# Patient Record
Sex: Female | Born: 1955
Health system: Southern US, Community
[De-identification: ages and names within clinical notes are randomized; demographics above are authoritative.]

## PROBLEM LIST (undated history)

## (undated) DIAGNOSIS — I10 Essential (primary) hypertension: Secondary | ICD-10-CM

## (undated) DIAGNOSIS — R112 Nausea with vomiting, unspecified: Secondary | ICD-10-CM

## (undated) DIAGNOSIS — T4145XA Adverse effect of unspecified anesthetic, initial encounter: Secondary | ICD-10-CM

## (undated) DIAGNOSIS — K602 Anal fissure, unspecified: Secondary | ICD-10-CM

## (undated) DIAGNOSIS — F32A Depression, unspecified: Secondary | ICD-10-CM

## (undated) DIAGNOSIS — F419 Anxiety disorder, unspecified: Secondary | ICD-10-CM

## (undated) DIAGNOSIS — F329 Major depressive disorder, single episode, unspecified: Secondary | ICD-10-CM

## (undated) DIAGNOSIS — I639 Cerebral infarction, unspecified: Secondary | ICD-10-CM

## (undated) DIAGNOSIS — Q251 Coarctation of aorta: Secondary | ICD-10-CM

## (undated) DIAGNOSIS — J449 Chronic obstructive pulmonary disease, unspecified: Secondary | ICD-10-CM

## (undated) DIAGNOSIS — G459 Transient cerebral ischemic attack, unspecified: Secondary | ICD-10-CM

## (undated) DIAGNOSIS — K5792 Diverticulitis of intestine, part unspecified, without perforation or abscess without bleeding: Secondary | ICD-10-CM

## (undated) DIAGNOSIS — M069 Rheumatoid arthritis, unspecified: Secondary | ICD-10-CM

## (undated) DIAGNOSIS — H409 Unspecified glaucoma: Secondary | ICD-10-CM

## (undated) DIAGNOSIS — K219 Gastro-esophageal reflux disease without esophagitis: Secondary | ICD-10-CM

## (undated) DIAGNOSIS — T8859XA Other complications of anesthesia, initial encounter: Secondary | ICD-10-CM

## (undated) DIAGNOSIS — K589 Irritable bowel syndrome without diarrhea: Secondary | ICD-10-CM

## (undated) DIAGNOSIS — G4730 Sleep apnea, unspecified: Secondary | ICD-10-CM

## (undated) DIAGNOSIS — Z9889 Other specified postprocedural states: Secondary | ICD-10-CM

## (undated) HISTORY — DX: Coarctation of aorta: Q25.1

## (undated) HISTORY — PX: BREAST LUMPECTOMY: SHX2

## (undated) HISTORY — PX: WRIST SURGERY: SHX841

## (undated) HISTORY — PX: ELBOW SURGERY: SHX618

## (undated) HISTORY — DX: Depression, unspecified: F32.A

## (undated) HISTORY — DX: Sleep apnea, unspecified: G47.30

## (undated) HISTORY — DX: Rheumatoid arthritis, unspecified: M06.9

## (undated) HISTORY — DX: Anal fissure, unspecified: K60.2

## (undated) HISTORY — PX: FISSURECTOMY: SHX5244

## (undated) HISTORY — DX: Major depressive disorder, single episode, unspecified: F32.9

## (undated) HISTORY — PX: FINGER SURGERY: SHX640

---

## 1980-03-05 HISTORY — PX: ABDOMINAL HYSTERECTOMY: SHX81

## 1998-10-14 ENCOUNTER — Other Ambulatory Visit: Admission: RE | Admit: 1998-10-14 | Discharge: 1998-10-14 | Payer: Self-pay | Admitting: Orthopedic Surgery

## 1999-04-11 ENCOUNTER — Ambulatory Visit (HOSPITAL_COMMUNITY): Admission: RE | Admit: 1999-04-11 | Discharge: 1999-04-11 | Payer: Self-pay | Admitting: Orthopedic Surgery

## 1999-04-11 ENCOUNTER — Encounter: Payer: Self-pay | Admitting: Orthopedic Surgery

## 1999-07-04 ENCOUNTER — Other Ambulatory Visit: Admission: RE | Admit: 1999-07-04 | Discharge: 1999-07-04 | Payer: Self-pay | Admitting: Orthopedic Surgery

## 2001-03-05 HISTORY — PX: NISSEN FUNDOPLICATION: SHX2091

## 2001-03-05 HISTORY — PX: CHOLECYSTECTOMY: SHX55

## 2001-04-23 ENCOUNTER — Ambulatory Visit (HOSPITAL_COMMUNITY): Admission: RE | Admit: 2001-04-23 | Discharge: 2001-04-23 | Payer: Self-pay | Admitting: General Surgery

## 2003-04-16 ENCOUNTER — Ambulatory Visit (HOSPITAL_COMMUNITY): Admission: RE | Admit: 2003-04-16 | Discharge: 2003-04-16 | Payer: Self-pay | Admitting: Internal Medicine

## 2008-07-31 ENCOUNTER — Inpatient Hospital Stay (HOSPITAL_COMMUNITY): Admission: EM | Admit: 2008-07-31 | Discharge: 2008-08-06 | Payer: Self-pay | Admitting: Internal Medicine

## 2008-07-31 ENCOUNTER — Encounter: Payer: Self-pay | Admitting: Emergency Medicine

## 2008-08-01 ENCOUNTER — Encounter (INDEPENDENT_AMBULATORY_CARE_PROVIDER_SITE_OTHER): Payer: Self-pay | Admitting: *Deleted

## 2009-01-24 ENCOUNTER — Ambulatory Visit (HOSPITAL_COMMUNITY): Admission: RE | Admit: 2009-01-24 | Discharge: 2009-01-24 | Payer: Self-pay | Admitting: Cardiovascular Disease

## 2009-01-24 ENCOUNTER — Ambulatory Visit: Payer: Self-pay | Admitting: Cardiovascular Disease

## 2009-04-05 ENCOUNTER — Emergency Department (HOSPITAL_COMMUNITY): Admission: EM | Admit: 2009-04-05 | Discharge: 2009-04-06 | Payer: Self-pay | Admitting: Emergency Medicine

## 2009-06-21 ENCOUNTER — Ambulatory Visit (HOSPITAL_COMMUNITY): Admission: RE | Admit: 2009-06-21 | Discharge: 2009-06-21 | Payer: Self-pay | Admitting: Internal Medicine

## 2009-08-23 ENCOUNTER — Encounter: Admission: RE | Admit: 2009-08-23 | Discharge: 2009-08-23 | Payer: Self-pay | Admitting: Internal Medicine

## 2009-09-28 ENCOUNTER — Ambulatory Visit (HOSPITAL_COMMUNITY): Admission: RE | Admit: 2009-09-28 | Discharge: 2009-09-28 | Payer: Self-pay | Admitting: General Surgery

## 2009-10-02 ENCOUNTER — Emergency Department (HOSPITAL_COMMUNITY): Admission: EM | Admit: 2009-10-02 | Discharge: 2009-10-03 | Payer: Self-pay | Admitting: Emergency Medicine

## 2009-11-15 ENCOUNTER — Ambulatory Visit: Payer: Self-pay | Admitting: Internal Medicine

## 2009-11-15 DIAGNOSIS — K625 Hemorrhage of anus and rectum: Secondary | ICD-10-CM | POA: Insufficient documentation

## 2009-11-15 DIAGNOSIS — K219 Gastro-esophageal reflux disease without esophagitis: Secondary | ICD-10-CM | POA: Insufficient documentation

## 2009-11-15 DIAGNOSIS — R1310 Dysphagia, unspecified: Secondary | ICD-10-CM | POA: Insufficient documentation

## 2009-11-15 DIAGNOSIS — R1013 Epigastric pain: Secondary | ICD-10-CM | POA: Insufficient documentation

## 2009-11-15 DIAGNOSIS — K5900 Constipation, unspecified: Secondary | ICD-10-CM | POA: Insufficient documentation

## 2009-11-17 ENCOUNTER — Encounter (INDEPENDENT_AMBULATORY_CARE_PROVIDER_SITE_OTHER): Payer: Self-pay | Admitting: *Deleted

## 2009-11-17 ENCOUNTER — Encounter: Payer: Self-pay | Admitting: Gastroenterology

## 2009-12-08 ENCOUNTER — Encounter: Payer: Self-pay | Admitting: Internal Medicine

## 2009-12-14 ENCOUNTER — Ambulatory Visit (HOSPITAL_COMMUNITY): Admission: RE | Admit: 2009-12-14 | Discharge: 2009-12-14 | Payer: Self-pay | Admitting: Internal Medicine

## 2009-12-14 ENCOUNTER — Ambulatory Visit: Payer: Self-pay | Admitting: Internal Medicine

## 2009-12-14 HISTORY — PX: ESOPHAGOGASTRODUODENOSCOPY: SHX1529

## 2009-12-14 HISTORY — PX: COLONOSCOPY: SHX174

## 2010-01-01 ENCOUNTER — Encounter: Payer: Self-pay | Admitting: Internal Medicine

## 2010-03-14 ENCOUNTER — Encounter (INDEPENDENT_AMBULATORY_CARE_PROVIDER_SITE_OTHER): Payer: Self-pay | Admitting: *Deleted

## 2010-04-06 NOTE — Miscellaneous (Signed)
Summary: sleep apnea   Past Medical History:    TCS, 2004, Dr. Gabriel Cirri, hyperplastic polyp.     HTN    Seasonal allergies    Insomnia    Depression    H/O rectal fissures    IBS    GERD    Diabetes    Gout    RA    TIA 2 years ago.    sleep apnea/cpap Clinical Lists Changes  Observations: Added new observation of PAST MED HX: TCS, 2004, Dr. Gabriel Cirri, hyperplastic polyp.  HTN Seasonal allergies Insomnia Depression H/O rectal fissures IBS GERD Diabetes Gout RA TIA 2 years ago. sleep apnea/cpap  (11/17/2009 16:12)
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
## 2010-04-06 NOTE — Letter (Signed)
Summary: PROCEDURE APPROVAL CONFIRMATION  PROCEDURE APPROVAL CONFIRMATION   Imported By: Ave Filter 12/08/2009 08:44:33  _____________________________________________________________________  External Attachment:    Type:   Image     Comment:   External Document

## 2010-04-06 NOTE — Letter (Signed)
Summary: Recall Office Visit  [facility]  [address]   [place], [place] [address]   Phone: [phone]  Fax: [phone]      March 14, 2010   [name] [address] [place], [place]  [address] [birth year]   Dear Ms. Rafuse,   According to our records, it is time for you to schedule a follow-up office visit with [place].   At your convenience, please call [phone] to schedule an office visit. If you have any questions, concerns, or feel that this letter is in error, we would appreciate your call.   Sincerely,    Diana Eves  [facility] Ph: [phone]   Fax: [phone]

## 2010-04-06 NOTE — Letter (Signed)
Summary: Patient Notice, Colon Biopsy Results  [facility]  [address]   [place], [place] [address]   Phone: [phone]  Fax: [phone]       January 01, 2010   CHARNEE TURNIPSEED [address] [place], [place]  [address] 1955/06/04    Dear Ms. Petrosino,  I am pleased to inform you that the biopsies taken during your recent colonoscopy did not show any evidence of cancer upon pathologic examination.  Additional information/recommendations:  You should have a repeat colonoscopy examination  in 5 years.  Please call us if you are having persistent problems or have questions about your condition that have not been fully answered at this time.  Sincerely,    R. Roetta Sessions MD, FACP [facility] Ph: [phone]    Fax: [phone]   Appended Document: Patient Notice, Colon Biopsy Results Letter mailed to pt.  Appended Document: Patient Notice, Colon Biopsy Results reminder in computer

## 2010-04-06 NOTE — Letter (Signed)
Summary: Recall, Screening Colonoscopy Only  [facility]  [address]   [place], [place] [address]   Phone: [phone]  Fax: [phone]    November 17, 2009  Brittney Martinez [address] [place], [place]  [address] [birth year]   Dear [name],   Our records indicate it is time to schedule your colonoscopy.  However, our office has been unable to contact you by phone.   Please call our office at [phone] and ask for the [facility].   Thank you,    Ave Filter  [facility] Ph: [phone]   Fax: [phone]

## 2010-04-06 NOTE — Letter (Signed)
Summary: EGD/TCS ORDER  EGD/TCS ORDER   Imported By: Ave Filter 11/15/2009 12:07:01  _____________________________________________________________________  External Attachment:    Type:   Image     Comment:   External Document

## 2010-05-18 LAB — GLUCOSE, CAPILLARY: Glucose-Capillary: 120 mg/dL — ABNORMAL HIGH (ref 70–99)

## 2010-05-20 LAB — BASIC METABOLIC PANEL
BUN: 10 mg/dL (ref 6–23)
BUN: 4 mg/dL — ABNORMAL LOW (ref 6–23)
CO2: 24 mEq/L (ref 19–32)
CO2: 25 mEq/L (ref 19–32)
Calcium: 9.1 mg/dL (ref 8.4–10.5)
Calcium: 9.2 mg/dL (ref 8.4–10.5)
Chloride: 104 mEq/L (ref 96–112)
Chloride: 107 mEq/L (ref 96–112)
Creatinine, Ser: 0.65 mg/dL (ref 0.4–1.2)
Creatinine, Ser: 0.66 mg/dL (ref 0.4–1.2)
GFR calc Af Amer: >60 mL/min (ref >60)
GFR calc Af Amer: >60 mL/min (ref >60)
GFR calc non Af Amer: >60 mL/min (ref >60)
GFR calc non Af Amer: >60 mL/min (ref >60)
Glucose, Bld: 127 mg/dL — ABNORMAL HIGH (ref 70–99)
Glucose, Bld: 167 mg/dL — ABNORMAL HIGH (ref 70–99)
Potassium: 3.5 mEq/L (ref 3.5–5.1)
Potassium: 4.1 mEq/L (ref 3.5–5.1)
Sodium: 136 mEq/L (ref 135–145)
Sodium: 139 mEq/L (ref 135–145)

## 2010-05-20 LAB — CBC
HCT: 33.2 % — ABNORMAL LOW (ref 36.0–46.0)
HCT: 37.1 % (ref 36.0–46.0)
Hemoglobin: 11.4 g/dL — ABNORMAL LOW (ref 12.0–15.0)
Hemoglobin: 12.6 g/dL (ref 12.0–15.0)
MCH: 30.9 pg (ref 26.0–34.0)
MCH: 31.1 pg (ref 26.0–34.0)
MCHC: 33.9 g/dL (ref 30.0–36.0)
MCHC: 34.4 g/dL (ref 30.0–36.0)
MCV: 90.5 fL (ref 78.0–100.0)
MCV: 91.3 fL (ref 78.0–100.0)
Platelets: 232 10*3/uL (ref 150–400)
Platelets: 244 10*3/uL (ref 150–400)
RBC: 3.67 MIL/uL — ABNORMAL LOW (ref 3.87–5.11)
RBC: 4.07 MIL/uL (ref 3.87–5.11)
RDW: 14.8 % (ref 11.5–15.5)
RDW: 15.3 % (ref 11.5–15.5)
WBC: 5.3 10*3/uL (ref 4.0–10.5)
WBC: 8.5 10*3/uL (ref 4.0–10.5)

## 2010-05-20 LAB — DIFFERENTIAL
Basophils Absolute: 0 10*3/uL (ref 0.0–0.1)
Basophils Relative: 1 % (ref 0–1)
Eosinophils Absolute: 0.1 10*3/uL (ref 0.0–0.7)
Eosinophils Relative: 1 % (ref 0–5)
Lymphocytes Relative: 22 % (ref 12–46)
Lymphs Abs: 1.9 10*3/uL (ref 0.7–4.0)
Monocytes Absolute: 0.7 10*3/uL (ref 0.1–1.0)
Monocytes Relative: 8 % (ref 3–12)
Neutro Abs: 5.8 10*3/uL (ref 1.7–7.7)
Neutrophils Relative %: 68 % (ref 43–77)

## 2010-05-20 LAB — SURGICAL PCR SCREEN
MRSA, PCR: NEGATIVE
Staphylococcus aureus: POSITIVE — AB

## 2010-05-20 LAB — POCT CARDIAC MARKERS
CKMB, poc: <1 ng/mL — ABNORMAL LOW (ref 1.0–8.0)
Myoglobin, poc: 84.1 ng/mL (ref 12–200)
Troponin i, poc: <0.05 ng/mL (ref 0.00–0.09)

## 2010-05-20 LAB — GLUCOSE, CAPILLARY
Glucose-Capillary: 123 mg/dL — ABNORMAL HIGH (ref 70–99)
Glucose-Capillary: 140 mg/dL — ABNORMAL HIGH (ref 70–99)

## 2010-06-07 LAB — GLUCOSE, CAPILLARY: Glucose-Capillary: 80 mg/dL (ref 70–99)

## 2010-06-12 LAB — GLUCOSE, CAPILLARY
Glucose-Capillary: 102 mg/dL — ABNORMAL HIGH (ref 70–99)
Glucose-Capillary: 104 mg/dL — ABNORMAL HIGH (ref 70–99)
Glucose-Capillary: 107 mg/dL — ABNORMAL HIGH (ref 70–99)
Glucose-Capillary: 110 mg/dL — ABNORMAL HIGH (ref 70–99)
Glucose-Capillary: 121 mg/dL — ABNORMAL HIGH (ref 70–99)
Glucose-Capillary: 125 mg/dL — ABNORMAL HIGH (ref 70–99)
Glucose-Capillary: 127 mg/dL — ABNORMAL HIGH (ref 70–99)
Glucose-Capillary: 129 mg/dL — ABNORMAL HIGH (ref 70–99)
Glucose-Capillary: 134 mg/dL — ABNORMAL HIGH (ref 70–99)
Glucose-Capillary: 135 mg/dL — ABNORMAL HIGH (ref 70–99)
Glucose-Capillary: 151 mg/dL — ABNORMAL HIGH (ref 70–99)
Glucose-Capillary: 169 mg/dL — ABNORMAL HIGH (ref 70–99)
Glucose-Capillary: 170 mg/dL — ABNORMAL HIGH (ref 70–99)
Glucose-Capillary: 183 mg/dL — ABNORMAL HIGH (ref 70–99)

## 2010-06-12 LAB — BASIC METABOLIC PANEL
BUN: 8 mg/dL (ref 6–23)
CO2: 27 mEq/L (ref 19–32)
Calcium: 8.9 mg/dL (ref 8.4–10.5)
Chloride: 109 mEq/L (ref 96–112)
Creatinine, Ser: 0.84 mg/dL (ref 0.4–1.2)
GFR calc Af Amer: >60 mL/min (ref >60)
GFR calc non Af Amer: >60 mL/min (ref >60)
Glucose, Bld: 202 mg/dL — ABNORMAL HIGH (ref 70–99)
Potassium: 4 mEq/L (ref 3.5–5.1)
Sodium: 142 mEq/L (ref 135–145)

## 2010-06-12 LAB — LUPUS ANTICOAGULANT PANEL
DRVVT: 41.4 secs (ref 36.1–47.0)
Lupus Anticoagulant: NOT DETECTED
PTT Lupus Anticoagulant: 30.5 secs — ABNORMAL LOW (ref 36.3–48.8)

## 2010-06-12 LAB — PROTEIN C, TOTAL: Protein C, Total: 123 % (ref 70–140)

## 2010-06-12 LAB — RPR: RPR Ser Ql: NONREACTIVE

## 2010-06-12 LAB — PROTEIN S ACTIVITY: Protein S Activity: 119 % (ref 69–129)

## 2010-06-12 LAB — CBC
HCT: 34.4 % — ABNORMAL LOW (ref 36.0–46.0)
Hemoglobin: 11.7 g/dL — ABNORMAL LOW (ref 12.0–15.0)
MCHC: 34.1 g/dL (ref 30.0–36.0)
MCV: 92.4 fL (ref 78.0–100.0)
Platelets: 232 10*3/uL (ref 150–400)
RBC: 3.72 MIL/uL — ABNORMAL LOW (ref 3.87–5.11)
RDW: 16.4 % — ABNORMAL HIGH (ref 11.5–15.5)
WBC: 6.5 10*3/uL (ref 4.0–10.5)

## 2010-06-12 LAB — BETA-2-GLYCOPROTEIN I ABS, IGG/M/A
Beta-2 Glyco I IgG: <4 U/mL (ref <20)
Beta-2-Glycoprotein I IgA: 6 U/mL (ref <10)
Beta-2-Glycoprotein I IgM: <4 U/mL (ref <10)

## 2010-06-12 LAB — CARDIOLIPIN ANTIBODIES, IGG, IGM, IGA
Anticardiolipin IgA: 13 [APL'U] — ABNORMAL LOW (ref <13)
Anticardiolipin IgG: <7 [GPL'U] — ABNORMAL LOW (ref <11)
Anticardiolipin IgM: <7 [MPL'U] — ABNORMAL LOW (ref <10)

## 2010-06-12 LAB — ANTITHROMBIN III: AntiThromb III Func: 134 % — ABNORMAL HIGH (ref 76–126)

## 2010-06-12 LAB — SEDIMENTATION RATE: Sed Rate: 60 mm/hr — ABNORMAL HIGH (ref 0–22)

## 2010-06-12 LAB — C4 COMPLEMENT: Complement C4, Body Fluid: 37 mg/dL (ref 16–47)

## 2010-06-12 LAB — C3 COMPLEMENT: C3 Complement: 198 mg/dL (ref 88–201)

## 2010-06-12 LAB — PROTHROMBIN GENE MUTATION

## 2010-06-12 LAB — ANA: Anti Nuclear Antibody(ANA): NEGATIVE

## 2010-06-12 LAB — HOMOCYSTEINE: Homocysteine: 13 umol/L (ref 4.0–15.4)

## 2010-06-12 LAB — COMPLEMENT, TOTAL: Compl, Total (CH50): >60 U/mL (ref 31–66)

## 2010-06-12 LAB — PROTEIN S, TOTAL: Protein S Ag, Total: 141 % — ABNORMAL HIGH (ref 70–140)

## 2010-06-12 LAB — FACTOR 5 LEIDEN

## 2010-06-12 LAB — PROTEIN C ACTIVITY: Protein C Activity: 154 % — ABNORMAL HIGH (ref 75–133)

## 2010-06-13 LAB — LIPID PANEL
Cholesterol: 210 mg/dL — ABNORMAL HIGH (ref 0–200)
HDL: 43 mg/dL (ref >39)
LDL Cholesterol: 148 mg/dL — ABNORMAL HIGH (ref 0–99)
Total CHOL/HDL Ratio: 4.9 RATIO
Triglycerides: 93 mg/dL (ref <150)
VLDL: 19 mg/dL (ref 0–40)

## 2010-06-13 LAB — DIFFERENTIAL
Basophils Absolute: 0.1 10*3/uL (ref 0.0–0.1)
Basophils Absolute: 0.1 10*3/uL (ref 0.0–0.1)
Basophils Relative: 1 % (ref 0–1)
Basophils Relative: 1 % (ref 0–1)
Eosinophils Absolute: 0.1 10*3/uL (ref 0.0–0.7)
Eosinophils Absolute: 0.1 10*3/uL (ref 0.0–0.7)
Eosinophils Relative: 1 % (ref 0–5)
Eosinophils Relative: 1 % (ref 0–5)
Lymphocytes Relative: 40 % (ref 12–46)
Lymphocytes Relative: 44 % (ref 12–46)
Lymphs Abs: 2.7 10*3/uL (ref 0.7–4.0)
Lymphs Abs: 3 10*3/uL (ref 0.7–4.0)
Monocytes Absolute: 0.4 10*3/uL (ref 0.1–1.0)
Monocytes Absolute: 0.4 10*3/uL (ref 0.1–1.0)
Monocytes Relative: 5 % (ref 3–12)
Monocytes Relative: 6 % (ref 3–12)
Neutro Abs: 3.2 10*3/uL (ref 1.7–7.7)
Neutro Abs: 3.6 10*3/uL (ref 1.7–7.7)
Neutrophils Relative %: 48 % (ref 43–77)
Neutrophils Relative %: 53 % (ref 43–77)

## 2010-06-13 LAB — GLUCOSE, CAPILLARY
Glucose-Capillary: 101 mg/dL — ABNORMAL HIGH (ref 70–99)
Glucose-Capillary: 102 mg/dL — ABNORMAL HIGH (ref 70–99)
Glucose-Capillary: 119 mg/dL — ABNORMAL HIGH (ref 70–99)
Glucose-Capillary: 124 mg/dL — ABNORMAL HIGH (ref 70–99)
Glucose-Capillary: 132 mg/dL — ABNORMAL HIGH (ref 70–99)
Glucose-Capillary: 134 mg/dL — ABNORMAL HIGH (ref 70–99)
Glucose-Capillary: 137 mg/dL — ABNORMAL HIGH (ref 70–99)
Glucose-Capillary: 161 mg/dL — ABNORMAL HIGH (ref 70–99)
Glucose-Capillary: 179 mg/dL — ABNORMAL HIGH (ref 70–99)
Glucose-Capillary: 79 mg/dL (ref 70–99)

## 2010-06-13 LAB — COMPREHENSIVE METABOLIC PANEL
ALT: 35 U/L (ref 0–35)
ALT: 43 U/L — ABNORMAL HIGH (ref 0–35)
AST: 27 U/L (ref 0–37)
AST: 39 U/L — ABNORMAL HIGH (ref 0–37)
Albumin: 3.2 g/dL — ABNORMAL LOW (ref 3.5–5.2)
Albumin: 3.4 g/dL — ABNORMAL LOW (ref 3.5–5.2)
Alkaline Phosphatase: 59 U/L (ref 39–117)
Alkaline Phosphatase: 68 U/L (ref 39–117)
BUN: 10 mg/dL (ref 6–23)
BUN: 8 mg/dL (ref 6–23)
CO2: 27 mEq/L (ref 19–32)
CO2: 30 mEq/L (ref 19–32)
Calcium: 8.9 mg/dL (ref 8.4–10.5)
Calcium: 8.9 mg/dL (ref 8.4–10.5)
Chloride: 104 mEq/L (ref 96–112)
Chloride: 110 mEq/L (ref 96–112)
Creatinine, Ser: 0.87 mg/dL (ref 0.4–1.2)
Creatinine, Ser: 0.92 mg/dL (ref 0.4–1.2)
GFR calc Af Amer: >60 mL/min (ref >60)
GFR calc Af Amer: >60 mL/min (ref >60)
GFR calc non Af Amer: >60 mL/min (ref >60)
GFR calc non Af Amer: >60 mL/min (ref >60)
Glucose, Bld: 200 mg/dL — ABNORMAL HIGH (ref 70–99)
Glucose, Bld: 85 mg/dL (ref 70–99)
Potassium: 3.4 mEq/L — ABNORMAL LOW (ref 3.5–5.1)
Potassium: 3.9 mEq/L (ref 3.5–5.1)
Sodium: 140 mEq/L (ref 135–145)
Sodium: 143 mEq/L (ref 135–145)
Total Bilirubin: 0.4 mg/dL (ref 0.3–1.2)
Total Bilirubin: 0.5 mg/dL (ref 0.3–1.2)
Total Protein: 6.3 g/dL (ref 6.0–8.3)
Total Protein: 6.6 g/dL (ref 6.0–8.3)

## 2010-06-13 LAB — URINE CULTURE
Colony Count: >=100000
Colony Count: >=100000
Special Requests: NEGATIVE

## 2010-06-13 LAB — CBC
HCT: 35.2 % — ABNORMAL LOW (ref 36.0–46.0)
HCT: 36.4 % (ref 36.0–46.0)
HCT: 37.2 % (ref 36.0–46.0)
Hemoglobin: 11.9 g/dL — ABNORMAL LOW (ref 12.0–15.0)
Hemoglobin: 12.4 g/dL (ref 12.0–15.0)
Hemoglobin: 13 g/dL (ref 12.0–15.0)
MCHC: 33.9 g/dL (ref 30.0–36.0)
MCHC: 34 g/dL (ref 30.0–36.0)
MCHC: 34.9 g/dL (ref 30.0–36.0)
MCV: 91.5 fL (ref 78.0–100.0)
MCV: 91.7 fL (ref 78.0–100.0)
MCV: 92.7 fL (ref 78.0–100.0)
Platelets: 226 10*3/uL (ref 150–400)
Platelets: 226 10*3/uL (ref 150–400)
Platelets: 231 10*3/uL (ref 150–400)
RBC: 3.79 MIL/uL — ABNORMAL LOW (ref 3.87–5.11)
RBC: 3.98 MIL/uL (ref 3.87–5.11)
RBC: 4.06 MIL/uL (ref 3.87–5.11)
RDW: 16.3 % — ABNORMAL HIGH (ref 11.5–15.5)
RDW: 16.5 % — ABNORMAL HIGH (ref 11.5–15.5)
RDW: 17.5 % — ABNORMAL HIGH (ref 11.5–15.5)
WBC: 6 10*3/uL (ref 4.0–10.5)
WBC: 6.7 10*3/uL (ref 4.0–10.5)
WBC: 6.9 10*3/uL (ref 4.0–10.5)

## 2010-06-13 LAB — BASIC METABOLIC PANEL
BUN: 9 mg/dL (ref 6–23)
CO2: 22 mEq/L (ref 19–32)
Calcium: 8.6 mg/dL (ref 8.4–10.5)
Chloride: 108 mEq/L (ref 96–112)
Creatinine, Ser: 0.79 mg/dL (ref 0.4–1.2)
GFR calc Af Amer: >60 mL/min (ref >60)
GFR calc non Af Amer: >60 mL/min (ref >60)
Glucose, Bld: 118 mg/dL — ABNORMAL HIGH (ref 70–99)
Potassium: 3.9 mEq/L (ref 3.5–5.1)
Sodium: 139 mEq/L (ref 135–145)

## 2010-06-13 LAB — CULTURE, BLOOD (ROUTINE X 2)
Culture: NO GROWTH
Culture: NO GROWTH

## 2010-06-13 LAB — PROTIME-INR
INR: 0.9 (ref 0.00–1.49)
INR: 1 (ref 0.00–1.49)
Prothrombin Time: 12.5 seconds (ref 11.6–15.2)
Prothrombin Time: 12.9 seconds (ref 11.6–15.2)

## 2010-06-13 LAB — URINALYSIS, ROUTINE W REFLEX MICROSCOPIC
Bilirubin Urine: NEGATIVE
Glucose, UA: NEGATIVE mg/dL
Hgb urine dipstick: NEGATIVE
Ketones, ur: NEGATIVE mg/dL
Leukocytes, UA: NEGATIVE
Nitrite: POSITIVE — AB
Protein, ur: NEGATIVE mg/dL
Specific Gravity, Urine: >1.03 — ABNORMAL HIGH (ref 1.005–1.030)
Urobilinogen, UA: 0.2 mg/dL (ref 0.0–1.0)
pH: 6 (ref 5.0–8.0)

## 2010-06-13 LAB — URINE MICROSCOPIC-ADD ON

## 2010-06-13 LAB — HEMOGLOBIN A1C
Hgb A1c MFr Bld: 6.8 % — ABNORMAL HIGH (ref 4.6–6.1)
Mean Plasma Glucose: 148 mg/dL

## 2010-06-13 LAB — PHOSPHORUS: Phosphorus: 4.6 mg/dL (ref 2.3–4.6)

## 2010-06-13 LAB — TSH: TSH: 1.496 u[IU]/mL (ref 0.350–4.500)

## 2010-06-13 LAB — RAPID URINE DRUG SCREEN, HOSP PERFORMED
Amphetamines: NOT DETECTED
Barbiturates: NOT DETECTED
Benzodiazepines: NOT DETECTED
Cocaine: NOT DETECTED
Opiates: NOT DETECTED
Tetrahydrocannabinol: NOT DETECTED

## 2010-06-13 LAB — APTT
aPTT: 20 seconds — ABNORMAL LOW (ref 24–37)
aPTT: 23 seconds — ABNORMAL LOW (ref 24–37)

## 2010-06-13 LAB — MAGNESIUM: Magnesium: 1.9 mg/dL (ref 1.5–2.5)

## 2010-07-18 NOTE — H&P (Signed)
Brittney Martinez, Brittney Martinez                 ACCOUNT NO.:  [id number]   MEDICAL RECORD NO.:  [id number]          PATIENT TYPE:  INP   LOCATION:  4729                         FACILITY:  [facility]   PHYSICIAN:  Manus Gunning, MD      DATE OF BIRTH:  1956-03-02   DATE OF ADMISSION:  07/31/2008  DATE OF DISCHARGE:                              HISTORY & PHYSICAL   CHIEF COMPLAINT:  Right-sided upper extremity weakness with associated  slurring of speech.   HISTORY OF PRESENT ILLNESS:  Brittney Martinez is a pleasant 55 year old African  American female who claims that she woke up this morning at 7 a.m. with  no complaints,  fell back to sleep and then woke up at 12 noon.  At 12  noon, she developed slurring of her speech and claimed that she knew  what words she wanted to articulate and could understand the people  surrounding her but was unable to pronounce the words secondary to the  slurring.  She also complained of right upper extremity weakness and  claimed that whenever she held in her right hand promptly fell out if  it.  This prompted her present to the emergency department where  symptoms had completely resolved by the time of presentation.  She  claims total duration of symptoms was approximately 1 hour.  She  complains of associated headache and lightheadedness.  No syncope, no  presyncope.  No trauma.  No blurring of vision.  No tinnitus, no  odynophagia, no dysphagia, no hoarseness of voice, no neck fullness.  No  chest pain, palpitations, PND, orthopnea.  No shortness of breath.  Positive cough.  No expectoration.  No dyspnea on exertion.  No sick  contacts.  No recent travel history.  No abdominal pain.  No nausea,  vomiting, diarrhea, dysuria, polyuria, hematuria or constipation.  No  bright red blood per rectum or melenic stools.  She has a history of  chronic pain and rheumatoid arthritis.   PAST MEDICAL/SURGICAL HISTORY:  1. Diabetes mellitus type 2.  2. GERD.  3. Hypertension.  4.  Irritable bowel syndrome.  5. Rheumatoid arthritis.  6. Carpal tunnels release.  7. Hysterectomy.  8. Cholecystectomy.  9. Dental extraction.  10.Biopsy of breast   ALLERGIES:  1. ASPIRIN.  2. DARVOCET.  3. ERYTHROMYCIN.   FAMILY HISTORY:  Mother died at giving birth to the patient.  Father  died, had a history of colon cancer.   SOCIAL HISTORY:  Smokes one pack every 2 days for approximately 20  years.  Denies alcohol.  Denies illicits.   NECK SECTION:   HOME MEDICATIONS:  Are as follows and this is per the patient's self  recollection:  1. Toprol 50 mg p.o. daily.  2. Doxepin 25 mg 3 at bedtime.  3. Prednisone 5 mg takes 2.5 mg daily.  4. Flexeril 25 mg daily at night.  5. Methotrexate 5 mg on Thursday and 5 mg on Friday.  6. Prilosec 40 mg daily.  7. Allegra 50 mg daily.  8. Albuterol inhaler 1 inhalation as needed.   REVIEW OF SYSTEMS:  Essentially a 14-point review of systems performed.  Pertinent positives and negatives as described above.   PHYSICAL EXAMINATION:  VITAL SIGNS:  At time of presentation are as  follows:  Temperature 98, heart rate 104, respiratory rate 20, blood  pressure 116/79, O2 saturation 100%.  GENERAL:  Well-nourished, well-developed [place] American female resting  comfortably in bed no apparent distress.  HEENT: Normocephalic,  atraumatic.  Moist oral mucosa.  No thrush, erythema or postnasal drip.  Eyes anicteric.  Extraocular muscles are intact.  Pupils are equal,  react to light and accommodation.  NECK:  Supple.  Good range of motion.  No thyromegaly.  No carotid  bruits.  Neck veins appear within normal limits.  CARDIOVASCULAR:  S1-S2 normal regular rate and rhythm.  No murmurs, rubs  or gallops.  RESPIRATIONS:  Air entry bilaterally equal.  No rales, rhonchi or  wheezes appreciated.  ABDOMEN:  Soft, nontender, nondistended.  Positive bowel sounds.  No  organomegaly.  EXTREMITIES: No cyanosis, clubbing or edema.  Positive  bilateral  dorsalis pedis.  CNS: Alert, oriented x3.  Cranial nerves II-XII grossly intact.  Power,  sensation and reflexes bilaterally symmetrical.  SKIN:  No breakdown swelling, ulcerations or masses.  HEM/ONCOLOGY: No palpable lymphadenopathy, ecchymosis, bruising or  petechiae.   LABORATORY TESTS:  CT scan of the head without contrast demonstrating  negative head CT. Urinalysis demonstrates many bacteria with 7-10 WBC  with negative leukocytes, positive nitrites, negative blood, protein,  ketones, bilirubin and glucose.  PTT 20, PT 12.5, INR 0.9.  Sodium 140,  potassium 3.4, chloride 104, CO2 30, glucose 200, BUN 10, creatinine  0.92, T bili 0.4, alk phos 68, AST 39, ALT 43, total protein 6.6,  albumin 3.4, calcium 8.99.  WBC 6900, hemoglobin 13, hematocrit 37.2,  platelet count 226, 53% polymorphs.   ASSESSMENT/PLAN:  1. Slurred speech with right-sided weakness.  Rule out cerebrovascular      accident.  Obtain MRI, obtain ultrasound of bilateral carotids.      Obtain 2-D echocardiogram.  Start the patient on Plavix 75 mg p.o.      daily.  Allow permissive hypertension for systolic blood pressures      around 140-160 mmHg.  Dr. Roseanne Reno of neurology is to see the      patient in the morning.  Obtain PT, OT consult as well as a speech      therapy evaluation.  A bedside swallow study was negative for      aspiration.  2. Urinary tract infection, appears to be mild.  Start Rocephin 1 gram      IV q.24 hours.  3. History of diabetes mellitus type 2.  Continue home medications.      Check hemoglobin A1c and fasting lipid profile.  4. Hypertension.  At this time allow permissive hypertension.  Hold      metoprolol for this time.  If systolic blood pressure greater than      180, hydralazine 10 mg IV q.6 hours p.r.n..  5. Rheumatoid arthritis.  Continue DMARDs and prednisone.  6. Gastrointestinal and deep vein thrombosis prophylaxis.  Sequential      compression devices and continue  Prilosec 40 mg p.o. daily.      Manus Gunning, MD  Electronically Signed     SP/MEDQ  D:  07/31/2008  T:  08/01/2008  Job:  [id number]

## 2010-07-18 NOTE — Discharge Summary (Signed)
Brittney Martinez, Brittney Martinez                 ACCOUNT NO.:  [id number]   MEDICAL RECORD NO.:  [id number]          PATIENT TYPE:  INP   LOCATION:  4712                         FACILITY:  [facility]   PHYSICIAN:  Ruthy Dick, MD    DATE OF BIRTH:  [birth year]   DATE OF ADMISSION:  07/31/2008  DATE OF DISCHARGE:                               DISCHARGE SUMMARY   REASON FOR ADMISSION:  Right-sided upper extremity weakness with slurred  speech.   FINAL DISCHARGE DIAGNOSES:  1. Acute cerebrovascular accident.  2. Urinary tract infection with resistant Escherichia coli.  3. Hypertension.  4. Diabetes mellitus.  5. Gastroesophageal reflux disease.  6. Irritable bowel syndrome.  7. Rheumatoid arthritis.  8. Carpel tunnel release.  9. Nonsustained ventricular tachycardia.   CONSULT DURING THIS ADMISSION:  Neurology consults.   PROCEDURES DONE DURING THIS ADMISSION:  1. CT scan of the head on presentation, which was negative.  2. MRI of the head, which showed [place] scattered prompted areas of      restricted effusions compatible with infarct in the posterior left      frontal lobe, no hemorrhage was seen.  3. CT angiogram of the neck, which showed mild aortic and right      carotid artery atherosclerosis, no hemodynamic stenosis in the      neck.  4. CT angiogram of the head, which showed bilateral internal carotid      artery calcified atherosclerosis without hemodynamically      significant stenosis.  It was thought to be otherwise negative      intracranial CTA.  5. A 2-D echocardiogram was read as having no cardiac source of emboli      identified.  There was no recurrent symptoms and hence      transesophageal echo was not necessary at this point.  Ejection      fraction was thought to be 65-70% with grade 2 diastolic      dysfunction.  6. Carotid Doppler was done and was noted to have no significant      plaque noted and no significant ICA stenosis noted bilaterally.   BRIEF HISTORY OF  PRESENT ILLNESS AND HOSPITAL COURSE:  This is a 55-year-  old African American lady with a past medical history as noted to  include rheumatoid arthritis, hypertension, diabetes mellitus who came  to the hospital after waking up around 7:00 a.m. with no complaints, but  at 12 noon started developing slurred speech and right-sided weakness  especially in the upper arm.  Admitted to the hospital to evaluate for  TIA versus CVA and MRI eventually indicated possibility of CVA.  The  patient started initially on aspirin, but the patient apparently is  either allergic to this medication or cannot tolerate it and has not  been placed on Plavix.  She was also noted during this admission to have  urinary tract infection with extended beta-lactamase E-coli and has been  started on Invanz IV.  She will be sent home on IV Invanz for 6 more  days.  Home health would be needed for IV antibiotics.  She has done  well today and says that she is doing very well except for occasional  headache, which is chronic for her and she seems to correlate this with  poor sleep often at night.  She is doing well today, no chest pain, no  shortness of breath, and no weakness noted.  No slurred speech, no  nausea and vomiting, no diarrhea, or no constipation.  The patient was  seen by the neurologist while he was in the hospital and the dose of  Plavix was decided by neurologist.   FOLLOWUP:  The patient is to follow with Dr. Jearld Fenton in [place] who is the  patient's primary care physician.  Dr. Jearld Fenton is also to decide if the  patient would need further workup as far as her nonsustained V-tach is  concerned.  The nonsustained V-tach in the hospital was easily  controlled with metoprolol.  A 2-D echo was fine and we believe the  patient requires further workup.  She could have this done in the  outpatient setting.  Again, Dr. Jearld Fenton is to consider looking to this.  The patient is to follow with Dr. Jearld Fenton in 1 week. F/U Dr  Marlis Edelson office  for outpt bubble study.   TIME USED FOR DISCHARGE PLAN:  Greater than 30 minutes.   DISCHARGE MEDICATIONS:  This patient is to go home on the following  medications:  1. Toprol-XL 100 mg p.o. daily.  2. Doxepin 75 mg daily.  3. Prednisone 2.5 mg p.o. daily.  4. Flexeril 2.5 mg p.o. at bedtime.  5. Prilosec 40 mg p.o. daily.  6. Allegra 60 mg p.o. daily.  7. Albuterol inhaler as needed.  8. Invanz 1 g IV daily for 6 days.  9. Plavix 75 mg p.o. daily.  10.Zocor 20 mg p.o. at bedtime.  11.Glucotrol 5 mg p.o. daily.   The patient is to have home health for IV antibiotics, and PICC line to  be discontinued once antibiotics completed.      Ruthy Dick, MD  Electronically Signed     GU/MEDQ  D:  08/04/2008  T:  08/04/2008  Job:  [id number]   cc:   Suzanna Obey, M.D.

## 2010-07-18 NOTE — Consult Note (Signed)
Brittney Martinez, Brittney Martinez                 ACCOUNT NO.:  [id number]   MEDICAL RECORD NO.:  [id number]          PATIENT TYPE:  INP   LOCATION:  4729                         FACILITY:  [facility]   PHYSICIAN:  Noel Christmas, MD    DATE OF BIRTH:  1955-05-11   DATE OF CONSULTATION:  08/01/2008  DATE OF DISCHARGE:                                 CONSULTATION   REASON FOR CONSULTATION:  Transient ischemic attack.   HISTORY OF PRESENT ILLNESS:  This is a 55 year old lady with a history  of experiencing transient speech difficulty and right upper extremity  weakness for about 1 hour yesterday.  She had trouble getting words out.  Speech reportedly was slightly slurred as well.  She had no right lower  extremity weakness but had weakness on the right upper extremity.  There  was no headache.  There was no previous history of stroke or TIA.  She  was seen in [facility] Room.  A CT scan of her hand  was negative.  The patient has not been on antiplatelet therapy.   PAST MEDICAL HISTORY:  Remarkable for hypertension, diabetes mellitus,  rheumatoid arthritis, irritable bowel syndrome, and GERD.   CURRENT MEDICATIONS:  1. Ultracet one 3 times a day p.r.n.  2. Prilosec 20 mg twice a day.  3. Allegra 180 mg per day.  4. Metoprolol 50 mg per day.  5. Dicyclomine 10 mg twice a day.  6. Doxepin 25 mg t.i.d.  7. Hydroxychloroquine 200 mg per day.  8. Prednisone 10 mg per day.  9. Premarin 0.45 mg per day.  10.Glipizide XL 5 mg per day.  11.Flexeril 10 mg twice a day.  12.Methotrexate 2.5 mg on Thursday and Friday of each week.  13.Calcium with vitamin D supplement daily.   FAMILY HISTORY:  Positive for hypertension.  There is no family history  of diabetes mellitus nor stroke.   PHYSICAL EXAMINATION:  GENERAL:  Appearance was that of an obese middle-  aged lady who was alert and cooperative, and in no acute distress.  There was no receptive or expressive aphasia.  Mental status was  normal.  HEENT:  Pupils were equal and reactive normally to light.  Extraocular  movements were full and conjugate.  Visual fields were intact and  normal.  There was no facial numbness.  No facial weakness.  Hearing was  normal.  Speech and palatal movement were normal.  NEUROLOGIC:  She had no pronator drift of upper extremity on either  side.  Strength and manual testing was normal throughout.  Deep tendon  reflexes were normal and symmetrical except for trace reflexes of the  ankles.  Plantar responses were flexor.  Sensory examination was normal.  Coordination was normal.  Carotid auscultation revealed no bruits.   CLINICAL IMPRESSION:  Transient ischemic attack involving left middle  cerebral artery territory, resolved.   RECOMMENDATIONS:  1. MRI and MRA of the brain without contrast as planned.  2. MRA of the neck with contrast.  3. Echocardiogram.  4. Aspirin 81 mg daily.   Thank you for asking me  to evaluate [name].      Noel Christmas, MD  Electronically Signed     CS/MEDQ  D:  08/01/2008  T:  08/02/2008  Job:  [id number]

## 2010-07-18 NOTE — Discharge Summary (Signed)
NAMEMALANEY, MCBEAN                 ACCOUNT NO.:  [id number]   MEDICAL RECORD NO.:  [id number]          PATIENT TYPE:  INP   LOCATION:  4712                         FACILITY:  [facility]   PHYSICIAN:  Ruthy Dick, MD    DATE OF BIRTH:  March 23, 1955   DATE OF ADMISSION:  07/31/2008  DATE OF DISCHARGE:  08/06/2008                               DISCHARGE SUMMARY   ADDENDUM   Please refer to the aforementioned document for final discharge  diagnoses, discharge medications, and follow up plan except as will be  noted in this new document.  In the interim, the patient's discharge was  withheld because the patient continued to have nonsustained V tach on  the telemetry strip.  Because of this, Cardiology evaluation was  requested.  The patient was seen by the [facility] Cardiology, and they  decided to do a stress Cardiolite study on the patient prior to  discharge.  This a.m., the patient has had a stress Cardiolite study and  the result is still pending.  If these results turn to be negative, then  the patient should be able to go home today on her medications.  As an  addendum to her discharge medications, the Cardiology must increase her  Toprol-XL to 100 mg daily.  Also as an addendum to the follow up plans,  she is to follow up with Dr. Marlis Edelson office to arrange outpatient bubble  study and emboli follow up within 1 month.  Phone number has been  provided in the instructions paper.  She is also to follow with Dr.  Eldridge Dace on August 24, 2008 and this is [facility].  Phone number  would also be given in the instructions paper.  Also, her IV antibiotics  of Invanz should now be for an additional 4 days starting from tomorrow.  Today, her vitals show a temperature of 97.0, pulse 77, respirations 18,  blood pressure 102/50, and saturating 98% on room air.  The patient has  no complaints whatsoever today, no chest pain, no shortness of breath,  no abdominal pain, no nausea, no vomiting, no  diarrhea, no constipation.  Time used for discharge planning is greater than 30 minutes.      Ruthy Dick, MD  Electronically Signed     GU/MEDQ  D:  08/06/2008  T:  08/07/2008  Job:  [id number]

## 2010-07-21 NOTE — H&P (Signed)
[facility]  Patient:    Brittney Martinez, Brittney Martinez Visit Number: [id number] MRN: [id number]          Service Type: Attending:  Elpidio Anis, M.D. Dictated by:   Elpidio Anis, M.D.                           History and Physical  HISTORY OF PRESENT ILLNESS:  The patient is a 55 year old female with a history of gastroesophageal reflux disease, recurrent dysphagia much worse with meals, no weight loss.  She also has left lower quadrant abdominal pain, and bowel movements are variable.  The patient is scheduled for upper endoscopy.  PAST MEDICAL HISTORY:  She has chronic pain, irritable bowel syndrome, seasonal allergies, asthma.  MEDICATIONS:  1. Baclofen 25 mg t.i.d.  2. Nexium 40 mg t.i.d.  3. Meclizine 12.5 mg t.i.d.  4. Allegra 180 mg q.d.  5. Diazepam 5 mg t.i.d.  6. Levsin 0.375 mg t.i.d.  7. Zantac 150 mg b.i.d.  8. Trazodone 5 mg q.h.s.  9. Paxil 40 mg b.i.d. 10. Flonase 50 mcg 1 puff b.i.d. 11. Premarin 1.25 mg q.d. 12. Albuterol 2 puffs q.d.  REVIEW OF SYSTEMS:  Positive for queasiness, shortness of breath, chronic hoarseness, chronic pain, difficulty with constipation and diarrhea.  She has severe personal and emotional distress.  She is chronically depressed.  PHYSICAL EXAMINATION:  VITAL SIGNS:  Blood pressure 120/68, pulse 84, respirations 24, weight 194 pounds.  HEENT:  No abnormality noted.  NECK:  Supple without JVD or bruit.  CHEST:  Clear to auscultation.  HEART:  Regular rate and rhythm without murmur, gallop, or rub.  ABDOMEN:  Diffuse tenderness, worse in the left upper quadrant and left lower quadrant.  No hepatosplenomegaly.  Normoactive bowel sounds.  EXTREMITIES:  No cyanosis, clubbing, or edema.  No joint deformity.  No detectable soft tissue tenderness.  NEUROLOGIC:  No focal motor, sensory, or cerebellar deficit.  IMPRESSION: 1. Gastroesophageal reflux disease with recurrent dysphagia. 2. Bronchial asthma. 3.  Irritable bowel syndrome.  PLAN:  Upper endoscopy. Dictated by:   Elpidio Anis, M.D. Attending:  Elpidio Anis, M.D. DD:  04/22/01 TD:  04/22/01 Job: [id number]

## 2010-10-01 ENCOUNTER — Emergency Department (HOSPITAL_COMMUNITY)
Admission: EM | Admit: 2010-10-01 | Discharge: 2010-10-01 | Disposition: A | Discharge status: Home / Self Care | Payer: Medicare Other | Attending: Emergency Medicine | Admitting: Emergency Medicine

## 2010-10-01 ENCOUNTER — Emergency Department (HOSPITAL_COMMUNITY): Payer: Medicare Other

## 2010-10-01 ENCOUNTER — Encounter: Payer: Self-pay | Admitting: Emergency Medicine

## 2010-10-01 DIAGNOSIS — F17200 Nicotine dependence, unspecified, uncomplicated: Secondary | ICD-10-CM | POA: Insufficient documentation

## 2010-10-01 DIAGNOSIS — R197 Diarrhea, unspecified: Secondary | ICD-10-CM | POA: Insufficient documentation

## 2010-10-01 DIAGNOSIS — J45909 Unspecified asthma, uncomplicated: Secondary | ICD-10-CM | POA: Insufficient documentation

## 2010-10-01 DIAGNOSIS — K219 Gastro-esophageal reflux disease without esophagitis: Secondary | ICD-10-CM | POA: Insufficient documentation

## 2010-10-01 DIAGNOSIS — Z79899 Other long term (current) drug therapy: Secondary | ICD-10-CM | POA: Insufficient documentation

## 2010-10-01 DIAGNOSIS — R1031 Right lower quadrant pain: Secondary | ICD-10-CM

## 2010-10-01 DIAGNOSIS — I10 Essential (primary) hypertension: Secondary | ICD-10-CM | POA: Insufficient documentation

## 2010-10-01 DIAGNOSIS — R110 Nausea: Secondary | ICD-10-CM | POA: Insufficient documentation

## 2010-10-01 DIAGNOSIS — E119 Type 2 diabetes mellitus without complications: Secondary | ICD-10-CM | POA: Insufficient documentation

## 2010-10-01 HISTORY — DX: Transient cerebral ischemic attack, unspecified: G45.9

## 2010-10-01 HISTORY — DX: Essential (primary) hypertension: I10

## 2010-10-01 HISTORY — DX: Diverticulitis of intestine, part unspecified, without perforation or abscess without bleeding: K57.92

## 2010-10-01 HISTORY — DX: Gastro-esophageal reflux disease without esophagitis: K21.9

## 2010-10-01 HISTORY — DX: Anxiety disorder, unspecified: F41.9

## 2010-10-01 HISTORY — DX: Irritable bowel syndrome, unspecified: K58.9

## 2010-10-01 LAB — DIFFERENTIAL
Basophils Absolute: 0 10*3/uL (ref 0.0–0.1)
Basophils Relative: 1 % (ref 0–1)
Eosinophils Absolute: 0.2 10*3/uL (ref 0.0–0.7)
Eosinophils Relative: 3 % (ref 0–5)
Lymphocytes Relative: 51 % — ABNORMAL HIGH (ref 12–46)
Lymphs Abs: 2.7 10*3/uL (ref 0.7–4.0)
Monocytes Absolute: 0.6 10*3/uL (ref 0.1–1.0)
Monocytes Relative: 10 % (ref 3–12)
Neutro Abs: 1.9 10*3/uL (ref 1.7–7.7)
Neutrophils Relative %: 35 % — ABNORMAL LOW (ref 43–77)

## 2010-10-01 LAB — URINALYSIS, ROUTINE W REFLEX MICROSCOPIC
Bilirubin Urine: NEGATIVE
Glucose, UA: NEGATIVE mg/dL
Hgb urine dipstick: NEGATIVE
Ketones, ur: NEGATIVE mg/dL
Leukocytes, UA: NEGATIVE
Nitrite: NEGATIVE
Protein, ur: NEGATIVE mg/dL
Specific Gravity, Urine: 1.01 (ref 1.005–1.030)
Urobilinogen, UA: 0.2 mg/dL (ref 0.0–1.0)
pH: 6.5 (ref 5.0–8.0)

## 2010-10-01 LAB — BASIC METABOLIC PANEL
BUN: 9 mg/dL (ref 6–23)
CO2: 21 mEq/L (ref 19–32)
Calcium: 9.2 mg/dL (ref 8.4–10.5)
Chloride: 107 mEq/L (ref 96–112)
Creatinine, Ser: 0.58 mg/dL (ref 0.50–1.10)
GFR calc Af Amer: >60 mL/min (ref >60)
GFR calc non Af Amer: >60 mL/min (ref >60)
Glucose, Bld: 151 mg/dL — ABNORMAL HIGH (ref 70–99)
Potassium: 3.6 mEq/L (ref 3.5–5.1)
Sodium: 141 mEq/L (ref 135–145)

## 2010-10-01 LAB — CBC
HCT: 36.2 % (ref 36.0–46.0)
Hemoglobin: 12.3 g/dL (ref 12.0–15.0)
MCH: 29.9 pg (ref 26.0–34.0)
MCHC: 34 g/dL (ref 30.0–36.0)
MCV: 87.9 fL (ref 78.0–100.0)
Platelets: 266 10*3/uL (ref 150–400)
RBC: 4.12 MIL/uL (ref 3.87–5.11)
RDW: 14.5 % (ref 11.5–15.5)
WBC: 5.3 10*3/uL (ref 4.0–10.5)

## 2010-10-01 MED ORDER — ONDANSETRON HCL 4 MG PO TABS: 4.0000 mg | ORAL_TABLET | Freq: Four times a day (QID) | ORAL | Status: AC

## 2010-10-01 MED ORDER — SODIUM CHLORIDE 0.9 % IV BOLUS (SEPSIS)
1000.0000 mL | Freq: Once | INTRAVENOUS | Status: AC
  Administered 2010-10-01: 1000 mL via INTRAVENOUS

## 2010-10-01 MED ORDER — HYDROMORPHONE HCL 1 MG/ML IJ SOLN
1.0000 mg | Freq: Once | INTRAMUSCULAR | Status: AC
  Administered 2010-10-01: 1 mg via INTRAVENOUS
  Filled 2010-10-01: qty 1

## 2010-10-01 MED ORDER — HYDROCODONE-ACETAMINOPHEN 5-500 MG PO TABS: 1.0000 | ORAL_TABLET | Freq: Four times a day (QID) | ORAL | Status: AC | PRN

## 2010-10-01 MED ORDER — CIPROFLOXACIN HCL 500 MG PO TABS: 500.0000 mg | ORAL_TABLET | Freq: Two times a day (BID) | ORAL | Status: AC

## 2010-10-01 MED ORDER — IOHEXOL 300 MG/ML  SOLN
100.0000 mL | Freq: Once | INTRAMUSCULAR | Status: AC | PRN
  Administered 2010-10-01: 100 mL via INTRAVENOUS

## 2010-10-01 MED ORDER — ONDANSETRON HCL 4 MG/2ML IJ SOLN
4.0000 mg | Freq: Once | INTRAMUSCULAR | Status: AC
  Administered 2010-10-01: 4 mg via INTRAVENOUS
  Filled 2010-10-01: qty 2

## 2010-10-01 MED ORDER — METRONIDAZOLE 500 MG PO TABS: 500.0000 mg | ORAL_TABLET | Freq: Two times a day (BID) | ORAL | Status: AC

## 2010-10-01 NOTE — Discharge Instructions (Signed)
Abdominal Pain Abdominal pain can be caused by many things. Your caregiver decides the seriousness of your pain by an examination and possibly blood tests and X-rays. Many cases can be observed and treated at home. Most abdominal pain is not caused by a disease and will probably improve without treatment. However, in many cases, more time must pass before a clear cause of the pain can be found. Before that point, it may not be known if you need more testing, or if hospitalization or surgery is needed. HOME CARE INSTRUCTIONS  Do not take laxatives unless directed by your caregiver.   Take pain medicine only as directed by your caregiver.   Only take over-the-counter or prescription medicines for pain, discomfort, or fever as directed by your caregiver.   Try a clear liquid diet (broth, tea, or water) for vomiting or as ordered by your caregiver. Slowly move to a bland diet as tolerated.  SEEK IMMEDIATE MEDICAL CARE IF:  The pain does not go away.   You or your child has an oral temperature above 101, not controlled by medicine.   You keep throwing up (vomiting).   The pain is felt only in portions of the abdomen. Pain in the right side could possibly be appendicitis. In an adult, pain in the left lower portion of the abdomen could be colitis or diverticulitis.   You pass bloody or black tarry stools.  MAKE SURE YOU:  Understand these instructions.   Will watch your condition.   Will get help right away if you are not doing well or get worse.  Document Released: 11/29/2004 Document Re-Released: 05/16/2009 [facility] Patient Information 2011 [place], [place].

## 2010-10-01 NOTE — ED Notes (Signed)
Pt awaiting CT scan. Assisted to bathroom and back to bed at 1342.

## 2010-10-01 NOTE — ED Notes (Signed)
Pt given meds per MD order. Lights turned out and warm blanket given to pt. NS infusing without difficulty.

## 2010-10-01 NOTE — ED Provider Notes (Signed)
Scribed for Dr. Brooke Dare, the patient was seen in room 6. This chart was scribed by Jannette Fogo. This patient's care was started at 12:16.    Chief Complaint  Patient presents with  . Abdominal Pain    HPI Brittney Martinez is a 55 y.o. female with a history of GERD, IBS, and diverticulitis who presents to the Emergency Department complaining of 5 days of RLQ abdominal pain. Patient states she developed right lower abdominal pain on Wednesday 09/27/10 with associated nausea and diarrhea, but denies any vomiting. Reports a history of similar symptoms with diverticulitis in the past. There are no other associated symptoms and no other alleviating or aggravating factors. No change in stool - no diarrhea, constipation, blood in stool.    Past Medical History  Diagnosis Date  . GERD (gastroesophageal reflux disease)   . TIA (transient ischemic attack)   . IBS (irritable bowel syndrome)   . Diverticulitis   . Asthma   . Anxiety   . Diabetes mellitus   . Hypertension     Past Surgical History  Procedure Date  . Cholecystectomy   . Breast lumpectomy     right  . Wrist surgery     left  . Elbow surgery     right  . Finger surgery     on right middle, pointer, and index fingers  . Abdominal hysterectomy     MEDICATIONS:  Previous Medications   BENZONATATE (TESSALON) 100 MG CAPSULE    Take 100 mg by mouth 3 (three) times daily as needed. cough    CHOLECALCIFEROL (VITAMIN D3) 400 UNITS CAPS    Take 1 tablet by mouth daily.     CLOPIDOGREL (PLAVIX) 75 MG TABLET    Take 75 mg by mouth daily.     CYCLOSPORINE (RESTASIS) 0.05 % OPHTHALMIC EMULSION    Place 1 drop into both eyes 2 (two) times daily.     DEXLANSOPRAZOLE (DEXILANT) 60 MG CAPSULE    Take 60 mg by mouth daily.     DOXEPIN (SINEQUAN) 75 MG CAPSULE    Take 75 mg by mouth at bedtime.     GLIPIZIDE (GLUCOTROL XL) 5 MG 24 HR TABLET    Take 10 mg by mouth 2 (two) times daily.     HYDROCHLOROTHIAZIDE 25 MG TABLET    Take 25 mg by mouth  daily as needed. For swelling    METHOTREXATE (RHEUMATREX) 2.5 MG TABLET    Take 5 mg by mouth 2 (two) times a week. Caution:Chemotherapy. Protect from light.patient takes on Thursday and Friday    METOCLOPRAMIDE (REGLAN) 5 MG TABLET    Take 5 mg by mouth 2 (two) times daily after a meal.     METOPROLOL (TOPROL-XL) 100 MG 24 HR TABLET    Take 50 mg by mouth 2 (two) times daily.     SIMVASTATIN (ZOCOR) 20 MG TABLET    Take 20 mg by mouth at bedtime.       ALLERGIES:  Allergies as of 10/01/2010 - Review Complete 10/01/2010  Allergen Reaction Noted  . Aspirin    . Propoxyphene n-acetaminophen    . Zithromax (azithromycin)  10/01/2010     Family History  Problem Relation Age of Onset  . Cancer Father   . Hypertension Sister   . Hypertension Brother     History  Substance Use Topics  . Smoking status: Current Everyday Smoker -- 0.2 packs/day for 19 years    Types: Cigarettes  . Smokeless tobacco: Never Used  .  Alcohol Use: No    OB History    Grav Para Term Preterm Abortions TAB SAB Ect Mult Living   2 2 2       2       Review of Systems  Constitutional: Negative.  Negative for fever.  HENT: Negative.   Respiratory: Negative.   Cardiovascular: Negative.   Gastrointestinal: Positive for nausea, abdominal pain and diarrhea. Negative for vomiting.  Genitourinary: Negative.   Musculoskeletal: Negative.   Skin: Negative.   Neurological: Negative.   Hematological: Negative.   All other systems reviewed and are negative.    Physical Exam   ED Triage Vitals  Enc Vitals Group     BP 10/01/10 1121 113/62 mmHg     Pulse Rate 10/01/10 1121 71      Resp 10/01/10 1121 20      Temp 10/01/10 1121 98.3 F (36.8 C)     Temp src 10/01/10 1121 Oral     SpO2 10/01/10 1121 98 %     Weight 10/01/10 1121 220 lb (99.791 kg)     Height 10/01/10 1121 5\' 1"  (1.549 m)     Pain Score 10/01/10 1121 Seven    Physical Exam  Constitutional: She is oriented to person, place, and time. She  appears well-developed and well-nourished.  HENT:  Head: Normocephalic and atraumatic.  Mouth/Throat: Oropharynx is clear and moist.  Eyes: Conjunctivae are normal. Pupils are equal, round, and reactive to light.  Neck: Normal range of motion. Neck supple.  Cardiovascular: Normal rate and regular rhythm.   No murmur heard. Pulmonary/Chest: Effort normal and breath sounds normal.  Abdominal: Soft. Bowel sounds are normal. She exhibits no distension. There is tenderness (RLQ ). There is guarding (voluntary ). There is no rebound.  Musculoskeletal: Normal range of motion. She exhibits no edema and no tenderness.  Neurological: She is alert and oriented to person, place, and time.  Skin: Skin is warm and dry.  Psychiatric: She has a normal mood and affect.    OTHER DATA REVIEWED: Nursing notes, vital signs, and past medical records reviewed.   DIAGNOSTIC STUDIES: Oxygen Saturation is 98% on room air, normal  by my interpretation.     LABS / RADIOLOGY:  Results for orders placed during the hospital encounter of 10/01/10  CBC      Component Value Range   WBC 5.3  4.0 - 10.5 (K/uL)   RBC 4.12  3.87 - 5.11 (MIL/uL)   Hemoglobin 12.3  12.0 - 15.0 (g/dL)   HCT 96.2  95.2 - 84.1 (%)   MCV 87.9  78.0 - 100.0 (fL)   MCH 29.9  26.0 - 34.0 (pg)   MCHC 34.0  30.0 - 36.0 (g/dL)   RDW 32.4  40.1 - 02.7 (%)   Platelets 266  150 - 400 (K/uL)  DIFFERENTIAL      Component Value Range   Neutrophils Relative 35 (*) 43 - 77 (%)   Neutro Abs 1.9  1.7 - 7.7 (K/uL)   Lymphocytes Relative 51 (*) 12 - 46 (%)   Lymphs Abs 2.7  0.7 - 4.0 (K/uL)   Monocytes Relative 10  3 - 12 (%)   Monocytes Absolute 0.6  0.1 - 1.0 (K/uL)   Eosinophils Relative 3  0 - 5 (%)   Eosinophils Absolute 0.2  0.0 - 0.7 (K/uL)   Basophils Relative 1  0 - 1 (%)   Basophils Absolute 0.0  0.0 - 0.1 (K/uL)  BASIC METABOLIC PANEL  Component Value Range   Sodium 141  135 - 145 (mEq/L)   Potassium 3.6  3.5 - 5.1 (mEq/L)    Chloride 107  96 - 112 (mEq/L)   CO2 21  19 - 32 (mEq/L)   Glucose, Bld 151 (*) 70 - 99 (mg/dL)   BUN 9  6 - 23 (mg/dL)   Creatinine, Ser 1.61  0.50 - 1.10 (mg/dL)   Calcium 9.2  8.4 - 09.6 (mg/dL)   GFR calc non Af Amer >60  >60 (mL/min)   GFR calc Af Amer >60  >60 (mL/min)  URINALYSIS, ROUTINE W REFLEX MICROSCOPIC      Component Value Range   Color, Urine YELLOW  YELLOW    Appearance CLEAR  CLEAR    Specific Gravity, Urine 1.010  1.005 - 1.030    pH 6.5  5.0 - 8.0    Glucose, UA NEGATIVE  NEGATIVE (mg/dL)   Hgb urine dipstick NEGATIVE  NEGATIVE    Bilirubin Urine NEGATIVE  NEGATIVE    Ketones, ur NEGATIVE  NEGATIVE (mg/dL)   Protein, ur NEGATIVE  NEGATIVE (mg/dL)   Urobilinogen, UA 0.2  0.0 - 1.0 (mg/dL)   Nitrite NEGATIVE  NEGATIVE    Leukocytes, UA NEGATIVE  NEGATIVE     CT Abdomen / Pelvis: Interpreted by Radiologist Dr. Simonne Martinet. STROUD 1. No acute findings within the abdomen or pelvis. 2. Fatty infiltration of the liver. 3. Status post cholecystectomy.   ED COURSE / COORDINATION OF CARE: 15:00 - Re-examined, ED physician discussed results with the patient and family. Patient stable for discharge.  Pain improved.    MDM: Differential Diagnosis: non-specific abdominal pain v diverticulitis. Based on examination and the patient's history of diverticulitis there was concern that there is repeat episode of diverticulitis. She has no fever she does have associated nausea, vomiting, diarrhea. There is no blood in her stool. States she had similar episodes with prior tightness of diverticulitis. There is no antibiotic exposure. Given the increased pain from prior episodes a CT scan was performed. CT scan was negative for acute pathology. The results with the patient she still wishes to be treated for diverticulitis. I will provide a seven-day course of Cipro and Flagyl as well as pain and nausea control. She is instructed to follow up with her primary care physician in one week.  She should return for worsening symptoms including persistent vomiting, fever, worsening abdominal pain.  I personally performed the services described in this documentation, which was scribed in my presence. The recorded information has been reviewed and considered.   IMPRESSION: Diagnoses that have been ruled out:  Diagnoses that are still under consideration:  Final diagnoses:  Abdominal pain, right lower quadrant     PLAN: Discharge  The patient is to return the emergency department if there is any worsening of symptoms. I have reviewed the discharge instructions with the patient.    CONDITION ON DISCHARGE: Stable    MEDICATIONS GIVEN IN THE E.D.  Medications  metoCLOPramide (REGLAN) 5 MG tablet (not administered)  methotrexate (RHEUMATREX) 2.5 MG tablet (not administered)  glipiZIDE (GLUCOTROL XL) 5 MG 24 hr tablet (not administered)  doxepin (SINEQUAN) 75 MG capsule (not administered)  simvastatin (ZOCOR) 20 MG tablet (not administered)  clopidogrel (PLAVIX) 75 MG tablet (not administered)  metoprolol (TOPROL-XL) 100 MG 24 hr tablet (not administered)  dexlansoprazole (DEXILANT) 60 MG capsule (not administered)  cycloSPORINE (RESTASIS) 0.05 % ophthalmic emulsion (not administered)  hydrochlorothiazide 25 MG tablet (not administered)  Cholecalciferol (VITAMIN D3)  400 UNITS CAPS (not administered)  benzonatate (TESSALON) 100 MG capsule (not administered)  ciprofloxacin (CIPRO) 500 MG tablet (not administered)  metroNIDAZOLE (FLAGYL) 500 MG tablet (not administered)  HYDROcodone-acetaminophen (VICODIN) 5-500 MG per tablet (not administered)  ondansetron (ZOFRAN) 4 MG tablet (not administered)  sodium chloride 0.9 % bolus 1,000 mL (1000 mL Intravenous Given 10/01/10 1248)  HYDROmorphone (DILAUDID) injection 1 mg (1 mg Intravenous Given 10/01/10 1248)  ondansetron (ZOFRAN) injection 4 mg (4 mg Intravenous Given 10/01/10 1248)  iohexol (OMNIPAQUE) 300 MG/ML injection 100 mL  (100 mL Intravenous Contrast Given 10/01/10 1415)     DISCHARGE MEDICATIONS: New Prescriptions   CIPROFLOXACIN (CIPRO) 500 MG TABLET    Take 1 tablet (500 mg total) by mouth 2 (two) times daily.   HYDROCODONE-ACETAMINOPHEN (VICODIN) 5-500 MG PER TABLET    Take 1-2 tablets by mouth every 6 (six) hours as needed for pain.   METRONIDAZOLE (FLAGYL) 500 MG TABLET    Take 1 tablet (500 mg total) by mouth 2 (two) times daily.   ONDANSETRON (ZOFRAN) 4 MG TABLET    Take 1 tablet (4 mg total) by mouth every 6 (six) hours.     Procedures       Dayton Bailiff, MD 10/01/10 1536

## 2010-10-01 NOTE — ED Notes (Signed)
Patient c/o rt  Lower quad abd pain that started on Wednesday after eating lunch. Patient reports diarrhea and nausea but denies any vomiting or fevers. Patient states "It hurts after I eat." Patient has hx of diverticulitis. No active vomiting noted.

## 2010-11-22 ENCOUNTER — Other Ambulatory Visit: Payer: Self-pay | Admitting: Internal Medicine

## 2011-01-03 ENCOUNTER — Other Ambulatory Visit: Payer: Self-pay | Admitting: Internal Medicine

## 2011-01-03 NOTE — Telephone Encounter (Signed)
Needs F/U appt with [place]. Overdue for routine f/u.

## 2011-01-04 NOTE — Telephone Encounter (Signed)
Pt is aware of her OV on 11/30 @ 1030 with KJ

## 2011-01-08 ENCOUNTER — Encounter: Payer: Self-pay | Admitting: Internal Medicine

## 2011-01-09 ENCOUNTER — Ambulatory Visit (INDEPENDENT_AMBULATORY_CARE_PROVIDER_SITE_OTHER): Payer: Medicare Other | Admitting: Urgent Care

## 2011-01-09 ENCOUNTER — Encounter: Payer: Self-pay | Admitting: Urgent Care

## 2011-01-09 DIAGNOSIS — B379 Candidiasis, unspecified: Secondary | ICD-10-CM

## 2011-01-09 DIAGNOSIS — K6289 Other specified diseases of anus and rectum: Secondary | ICD-10-CM

## 2011-01-09 DIAGNOSIS — K5900 Constipation, unspecified: Secondary | ICD-10-CM

## 2011-01-09 DIAGNOSIS — K219 Gastro-esophageal reflux disease without esophagitis: Secondary | ICD-10-CM

## 2011-01-09 DIAGNOSIS — Z800 Family history of malignant neoplasm of digestive organs: Secondary | ICD-10-CM

## 2011-01-09 DIAGNOSIS — B372 Candidiasis of skin and nail: Secondary | ICD-10-CM

## 2011-01-09 DIAGNOSIS — E669 Obesity, unspecified: Secondary | ICD-10-CM | POA: Insufficient documentation

## 2011-01-09 MED ORDER — LIDOCAINE-HYDROCORTISONE ACE 3-2.5 % RE KIT: 1.0000 | PACK | Freq: Two times a day (BID) | RECTAL | Status: DC

## 2011-01-09 MED ORDER — DEXLANSOPRAZOLE 60 MG PO CPDR: 60.0000 mg | DELAYED_RELEASE_CAPSULE | Freq: Every day | ORAL | Status: DC

## 2011-01-09 MED ORDER — NYSTATIN-TRIAMCINOLONE 100000-0.1 UNIT/GM-% EX CREA: TOPICAL_CREAM | Freq: Three times a day (TID) | CUTANEOUS | Status: AC

## 2011-01-09 NOTE — Patient Instructions (Signed)
Low fat/low cholesterol diet Lose 1-2 pounds PER WEEK Continue dexilant 60mg  daily Apply mycolog for rash, if no better see Dr Sherril Croon Office visit in 2 yrs or sooner if needed Colonoscopy 2016  Serving Sizes What we call a serving size today is larger than it was in the past. A 1950s fast-food burger contained little more than 1 oz of meat, and a soft drink was 8 oz (1 cup). Today, a "quarter pounder" burger is at least 4 times that amount, and a 32 or 64 oz drink is not uncommon. A possible guide for eating when trying to lose weight is to eat about half as much as you normally do. Some estimates of serving sizes are:  1 Dairy serving:Individual container of yogurt (8 oz) or piece of cheese the size of your thumb (1 oz).   1 Grain serving: 1 slice of bread or  cup pasta.   1 Meat serving: The size of a deck of cards (3 oz).   1 Fruit serving: cup canned fruit or 1 medium fruit.   1 Vegetable serving:  cup of cooked or canned vegetables.   1 Fat serving:The size of 4 stacked dimes.  Experts suggest spending 1 or 2 days measuring food portions you commonly eat. This will give you better practice at estimating serving sizes, and will also show whether you are eating an appropriate amount of food to meet your weight goals. If you find that you are eating more than you thought, try measuring your food for a few days so you can "reprogram" yourself to learn what makes a healthy portion for you. SUGGESTIONS FOR CONTROL  In restaurants, share entrees, or ask the waiter to put half the entre in a box or bag before you even touch it.   Order lunch-sized portions. Many restaurants serve 4 to 6 oz of meat at lunch, compared with 8 to 10 oz at dinner.   Split dessert or skip it all together. Have a piece of fruit when you get home.   At home, use smaller plates and bowls. It will look as if you are eating more.   Plate your food in the kitchen rather than serving it "family style" at the  table.   Wait 20 to 30 minutes before taking seconds. This is how long it takes your brain to recognize that you are full.   Check food labels for serving sizes. Eat 1 serving only.   Use measuring cups and spoons to see proper serving sizes.   Buy smaller packages of candy, popcorn, and snacks.   Avoid eating directly out of the bag or carton.   While eating half as much, exercise twice as much. Park further away from the mall, take the stairs instead of the escalator, and walk around your block.  Losing weight is a slow, difficult process. It takes long-lasting lifestyle changes. You can make gradual changes over time so they become habits. Look to friends and family to support the healthy changes you are making. Avoid fad diets since they are often only temporary weight loss solutions. Document Released: 11/18/2002 Document Revised: 11/01/2010 Document Reviewed: 12/28/2008 [facility] Patient Information 2012 [place], [place].   Cholesterol Control Diet Cholesterol levels in your body are determined significantly by your diet. Cholesterol levels may also be related to heart disease. The following material helps to explain this relationship and discusses what you can do to help keep your heart healthy. Not all cholesterol is bad. Low-density lipoprotein (LDL) cholesterol is  the "bad" cholesterol. It may cause fatty deposits to build up inside your arteries. High-density lipoprotein (HDL) cholesterol is "good." It helps to remove the "bad" LDL cholesterol from your blood. Cholesterol is a very important risk factor for heart disease. Other risk factors are high blood pressure, smoking, stress, heredity, and weight. The heart muscle gets its supply of blood through the coronary arteries. If your LDL cholesterol is high and your HDL cholesterol is low, you are at risk for having fatty deposits build up in your coronary arteries. This leaves less room through which blood can flow. Without sufficient  blood and oxygen, the heart muscle cannot function properly and you may feel chest pains (angina pectoris). When a coronary artery closes up entirely, a part of the heart muscle may die, causing a heart attack (myocardial infarction). CHECKING CHOLESTEROL When your caregiver sends your blood to a lab to be analyzed for cholesterol, a complete lipid (fat) profile may be done. With this test, the total amount of cholesterol and levels of LDL and HDL are determined. Triglycerides are a type of fat that circulates in the blood and can also be used to determine heart disease risk. The list below describes what the numbers should be: Test: Total Cholesterol.  Less than 200 mg/dl.  Test: LDL "bad cholesterol."  Less than 100 mg/dl.   Less than 70 mg/dl if you are at very high risk of a heart attack or sudden cardiac death.  Test: HDL "good cholesterol."  Greater than 50 mg/dl for women.   Greater than 40 mg/dl for men.  Test: Triglycerides.  Less than 150 mg/dl.  CONTROLLING CHOLESTEROL WITH DIET Although exercise and lifestyle factors are important, your diet is key. That is because certain foods are known to raise cholesterol and others to lower it. The goal is to balance foods for their effect on cholesterol and more importantly, to replace saturated and trans fat with other types of fat, such as monounsaturated fat, polyunsaturated fat, and omega-3 fatty acids. On average, a person should consume no more than 15 to 17 g of saturated fat daily. Saturated and trans fats are considered "bad" fats, and they will raise LDL cholesterol. Saturated fats are primarily found in animal products such as meats, butter, and cream. However, that does not mean you need to sacrifice all your favorite foods. Today, there are good tasting, low-fat, low-cholesterol substitutes for most of the things you like to eat. Choose low-fat or nonfat alternatives. Choose round or loin cuts of red meat, since these types of cuts  are lowest in fat and cholesterol. Chicken (without the skin), fish, veal, and ground [place] breast are excellent choices. Eliminate fatty meats, such as hot dogs and salami. Even shellfish have little or no saturated fat. Have a 3 oz (85 g) portion when you eat lean meat, poultry, or fish. Trans fats are also called "partially hydrogenated oils." They are oils that have been scientifically manipulated so that they are solid at room temperature resulting in a longer shelf life and improved taste and texture of foods in which they are added. Trans fats are found in stick margarine, some tub margarines, cookies, crackers, and baked goods.  When baking and cooking, oils are an excellent substitute for butter. The monounsaturated oils are especially beneficial since it is believed they lower LDL and raise HDL. The oils you should avoid entirely are saturated tropical oils, such as coconut and palm.  Remember to eat liberally from food groups that are naturally  free of saturated and trans fat, including fish, fruit, vegetables, beans, grains (barley, rice, couscous, bulgur wheat), and pasta (without cream sauces).  IDENTIFYING FOODS THAT LOWER CHOLESTEROL  Soluble fiber may lower your cholesterol. This type of fiber is found in fruits such as apples, vegetables such as broccoli, potatoes, and carrots, legumes such as beans, peas, and lentils, and grains such as barley. Foods fortified with plant sterols (phytosterol) may also lower cholesterol. You should eat at least 2 g per day of these foods for a cholesterol lowering effect.  Read package labels to identify low-saturated fats, trans fats free, and low-fat foods at the supermarket. Select cheeses that have only 2 to 3 g saturated fat per ounce. Use a heart-healthy tub margarine that is free of trans fats or partially hydrogenated oil. When buying baked goods (cookies, crackers), avoid partially hydrogenated oils. Breads and muffins should be made from whole  grains (whole-wheat or whole oat flour, instead of "flour" or "enriched flour"). Buy non-creamy canned soups with reduced salt and no added fats.  FOOD PREPARATION TECHNIQUES  Never deep-fry. If you must fry, either stir-fry, which uses very little fat, or use non-stick cooking sprays. When possible, broil, bake, or roast meats, and steam vegetables. Instead of dressing vegetables with butter or margarine, use lemon and herbs, applesauce and cinnamon (for squash and sweet potatoes), nonfat yogurt, salsa, and low-fat dressings for salads.  LOW-SATURATED FAT / LOW-FAT FOOD SUBSTITUTES Meats / Saturated Fat (g)  Avoid: Steak, marbled (3 oz/85 g) / 11 g   Choose: Steak, lean (3 oz/85 g) / 4 g   Avoid: Hamburger (3 oz/85 g) / 7 g   Choose: Hamburger, lean (3 oz/85 g) / 5 g   Avoid: Ham (3 oz/85 g) / 6 g   Choose: Ham, lean cut (3 oz/85 g) / 2.4 g   Avoid: Chicken, with skin, dark meat (3 oz/85 g) / 4 g   Choose: Chicken, skin removed, dark meat (3 oz/85 g) / 2 g   Avoid: Chicken, with skin, light meat (3 oz/85 g) / 2.5 g   Choose: Chicken, skin removed, light meat (3 oz/85 g) / 1 g  Dairy / Saturated Fat (g)  Avoid: Whole milk (1 cup) / 5 g   Choose: Low-fat milk, 2% (1 cup) / 3 g   Choose: Low-fat milk, 1% (1 cup) / 1.5 g   Choose: Skim milk (1 cup) / 0.3 g   Avoid: Hard cheese (1 oz/28 g) / 6 g   Choose: Skim milk cheese (1 oz/28 g) / 2 to 3 g   Avoid: Cottage cheese, 4% fat (1 cup) / 6.5 g   Choose: Low-fat cottage cheese, 1% fat (1 cup) / 1.5 g   Avoid: Ice cream (1 cup) / 9 g   Choose: Sherbet (1 cup) / 2.5 g   Choose: Nonfat frozen yogurt (1 cup) / 0.3 g   Choose: Frozen fruit bar / trace   Avoid: Whipped cream (1 tbs) / 3.5 g   Choose: Nondairy whipped topping (1 tbs) / 1 g  Condiments / Saturated Fat (g)  Avoid: Mayonnaise (1 tbs) / 2 g   Choose: Low-fat mayonnaise (1 tbs) / 1 g   Avoid: Butter (1 tbs) / 7 g   Choose: Extra light margarine (1 tbs) / 1  g   Avoid: Coconut oil (1 tbs) / 11.8 g   Choose: Olive oil (1 tbs) / 1.8 g   Choose: Corn oil (1 tbs) /  1.7 g   Choose: Safflower oil (1 tbs) / 1.2 g   Choose: Sunflower oil (1 tbs) / 1.4 g   Choose: Soybean oil (1 tbs) / 2.4 g   Choose: Canola oil (1 tbs) / 1 g  Document Released: 02/19/2005 Document Revised: 11/01/2010 Document Reviewed: 08/10/2010 [facility] Patient Information 2012 [place], [place].

## 2011-01-09 NOTE — Progress Notes (Signed)
Cc to PCP 

## 2011-01-09 NOTE — Assessment & Plan Note (Signed)
Mycolog II to the affected area 3 times a day Follow with VYAS,DHRUV B., MD, MD if no improvement

## 2011-01-09 NOTE — Assessment & Plan Note (Signed)
Chronic proctalgia. History of multiple anal fissures.  Anamantle HC forte twice a day

## 2011-01-09 NOTE — Assessment & Plan Note (Signed)
Doing well, occasional breakthrough at night after dinner. Change timing of Dexilant 60 mg to 30 minutes before dinner. If no better, call us.

## 2011-01-09 NOTE — Progress Notes (Signed)
Primary Care Physician:  Ignatius Specking., MD, MD Primary Gastroenterologist:  Dr. Jena Gauss  Chief Complaint  Patient presents with  . Medication Refill  . Rash    on left side   HPI:  Brittney Martinez is a 55 y.o. female here for follow up for IBS/GERD.  Doing well.  Occasional diarrhea intermittently.  Some weeks better than others.  Takes metamucil as needed.  Dexilant 60mg  9am daily.  After supper having some breakthrough.  Rash x 3 weeks itchy to lower abdomen & top of legs.  Past Medical History  Diagnosis Date  . GERD (gastroesophageal reflux disease)   . TIA (transient ischemic attack)   . IBS (irritable bowel syndrome)   . Diverticulitis   . Asthma   . Anxiety   . Diabetes mellitus   . Hypertension   . Depression     grief  . Anal fissure   . Gout   . RA (rheumatoid arthritis)   . Sleep apnea     cpap   Past Surgical History  Procedure Date  . Cholecystectomy 2003  . Breast lumpectomy     right-benign  . Wrist surgery     left  . Elbow surgery     right  . Finger surgery     on right middle, pointer, and index fingers  . Abdominal hysterectomy 1982    complete  . Esophagogastroduodenoscopy 12/14/09    schatzkis ring 77F, otherwise normal  . Colonoscopy 12/14/09    anal papilla and hemorrhoids,diminutive hperplastic rectal polyps/normal colon  . Nissen fundoplication 2003    [facility]  . Fissurectomy     several   Current Outpatient Prescriptions  Medication Sig Dispense Refill  . benzonatate (TESSALON) 100 MG capsule Take 100 mg by mouth 3 (three) times daily as needed. cough       . Cholecalciferol (VITAMIN D3) 400 UNITS CAPS Take 1 tablet by mouth daily.        . clopidogrel (PLAVIX) 75 MG tablet Take 75 mg by mouth daily.        . cycloSPORINE (RESTASIS) 0.05 % ophthalmic emulsion Place 1 drop into both eyes 2 (two) times daily.        Marland Kitchen dexlansoprazole (DEXILANT) 60 MG capsule Take 1 capsule (60 mg total) by mouth daily.  90 capsule  3  . doxepin  (SINEQUAN) 75 MG capsule Take 75 mg by mouth at bedtime.        Marland Kitchen glipiZIDE (GLUCOTROL XL) 5 MG 24 hr tablet Take 10 mg by mouth 2 (two) times daily.        . hydrochlorothiazide 25 MG tablet Take 25 mg by mouth daily as needed. For swelling       . methotrexate (RHEUMATREX) 2.5 MG tablet Take 5 mg by mouth 2 (two) times a week. Caution:Chemotherapy. Protect from light.patient takes on Thursday and Friday       . metoprolol (TOPROL-XL) 100 MG 24 hr tablet Take 50 mg by mouth 2 (two) times daily.        . simvastatin (ZOCOR) 20 MG tablet Take 20 mg by mouth at bedtime.        . Lidocaine-Hydrocortisone Ace 3-2.5 % KIT Place 1 kit rectally 2 (two) times daily.  20 each  1  . nystatin-triamcinolone (MYCOLOG II) cream Apply topically 3 (three) times daily. Apply to affected areas of abd/thighs for 2 weeks  30 g  0   Allergies as of 01/09/2011 - Review Complete 01/09/2011  Allergen  Reaction Noted  . Aspirin Nausea Only   . Propoxyphene n-acetaminophen Nausea And Vomiting   . Zithromax (azithromycin) Nausea Only 10/01/2010  Review of Systems: Gen: Denies any fever, chills, sweats, anorexia, fatigue, weakness, malaise, weight loss, and sleep disorder CV: Denies chest pain, angina, palpitations, syncope, orthopnea, PND, peripheral edema, and claudication. Resp: Denies dyspnea at rest, dyspnea with exercise, cough, sputum, wheezing, coughing up blood, and pleurisy. GI: Denies vomiting blood, jaundice, and fecal incontinence.   Denies dysphagia or odynophagia. Derm: Denies rash, itching, dry skin, hives, moles, warts, or unhealing ulcers.  Psych: Denies depression, anxiety, memory loss, suicidal ideation, hallucinations, paranoia, and confusion. Heme: Denies bruising, bleeding, and enlarged lymph nodes.  Physical Exam: BP 130/73  Pulse 73  Temp(Src) 97.1 F (36.2 C) (Temporal)  Ht 5\' 2"  (1.575 m)  Wt 213 lb 9.6 oz (96.888 kg)  BMI 39.07 kg/m2 General:   Alert,  Well-developed, obese, pleasant  and cooperative in NAD Head:  Normocephalic and atraumatic. Eyes:  Sclera clear, no icterus.   Conjunctiva pink. Mouth:  No deformity or lesions, OP pink/moist. Neck:  Supple; no masses or thyromegaly. Heart:  Regular rate and rhythm; no murmurs, clicks, rubs,  or gallops. Abdomen:  + slightly erythematous demarcated intertriginous areas.   Soft, obese, nontender and nondistended. No masses, hepatosplenomegaly. Small easily reducible umbilicus hernia. Nontender. Normal bowel sounds, without guarding, and without rebound.   Msk:  Symmetrical without gross deformities. Normal posture. Pulses:  Normal pulses noted. Extremities:  Without clubbing or edema. Neurologic:  Alert and  oriented x4;  grossly normal neurologically. Skin:  Intact without significant lesions or rashes. Cervical Nodes:  No significant cervical adenopathy. Psych:  Alert and cooperative. Normal mood and affect.

## 2011-01-09 NOTE — Assessment & Plan Note (Signed)
Low-fat, low-cholesterol diet. Weight loss one to 2 pounds per week.

## 2011-01-09 NOTE — Assessment & Plan Note (Signed)
IBS-mixed. Doing well with when necessary Metamucil.

## 2011-01-09 NOTE — Assessment & Plan Note (Addendum)
Colonoscopy 12/2014

## 2011-01-10 ENCOUNTER — Telehealth: Payer: Self-pay

## 2011-01-10 NOTE — Telephone Encounter (Signed)
PA paperwork started and on AS desk

## 2011-01-10 NOTE — Telephone Encounter (Signed)
Called pt- this is a PA for pts insurance

## 2011-01-10 NOTE — Telephone Encounter (Signed)
Phone number to call for her Lidocaine    [phone]. She said it will only cost her $40.00.

## 2011-01-11 ENCOUNTER — Telehealth: Payer: Self-pay | Admitting: Urgent Care

## 2011-01-11 NOTE — Telephone Encounter (Signed)
Spoke with pt- this just started this am.  boil is located between the "lips" of her vagina. Informed pt that she needed to call her pcp. Pt verbalized understanding and said she would call pcp.

## 2011-01-11 NOTE — Telephone Encounter (Signed)
Agree 

## 2011-01-11 NOTE — Telephone Encounter (Signed)
Is asking for an antibiotic for a boil that has come up between her legs please advise/uses [facility] in [place]

## 2011-02-02 ENCOUNTER — Ambulatory Visit: Payer: Medicare Other | Admitting: Urgent Care

## 2011-05-19 ENCOUNTER — Other Ambulatory Visit: Payer: Self-pay

## 2011-05-19 ENCOUNTER — Emergency Department (HOSPITAL_COMMUNITY)
Admission: EM | Admit: 2011-05-19 | Discharge: 2011-05-19 | Disposition: A | Discharge status: Home / Self Care | Payer: Medicare Other | Attending: Emergency Medicine | Admitting: Emergency Medicine

## 2011-05-19 ENCOUNTER — Encounter (HOSPITAL_COMMUNITY): Payer: Self-pay

## 2011-05-19 ENCOUNTER — Emergency Department (HOSPITAL_COMMUNITY): Payer: Medicare Other

## 2011-05-19 DIAGNOSIS — M79609 Pain in unspecified limb: Secondary | ICD-10-CM | POA: Insufficient documentation

## 2011-05-19 DIAGNOSIS — J45909 Unspecified asthma, uncomplicated: Secondary | ICD-10-CM | POA: Insufficient documentation

## 2011-05-19 DIAGNOSIS — E119 Type 2 diabetes mellitus without complications: Secondary | ICD-10-CM | POA: Insufficient documentation

## 2011-05-19 DIAGNOSIS — R071 Chest pain on breathing: Secondary | ICD-10-CM | POA: Insufficient documentation

## 2011-05-19 DIAGNOSIS — Z8639 Personal history of other endocrine, nutritional and metabolic disease: Secondary | ICD-10-CM | POA: Insufficient documentation

## 2011-05-19 DIAGNOSIS — Z862 Personal history of diseases of the blood and blood-forming organs and certain disorders involving the immune mechanism: Secondary | ICD-10-CM | POA: Insufficient documentation

## 2011-05-19 DIAGNOSIS — I10 Essential (primary) hypertension: Secondary | ICD-10-CM | POA: Insufficient documentation

## 2011-05-19 DIAGNOSIS — M069 Rheumatoid arthritis, unspecified: Secondary | ICD-10-CM | POA: Insufficient documentation

## 2011-05-19 DIAGNOSIS — K219 Gastro-esophageal reflux disease without esophagitis: Secondary | ICD-10-CM | POA: Insufficient documentation

## 2011-05-19 DIAGNOSIS — K589 Irritable bowel syndrome without diarrhea: Secondary | ICD-10-CM | POA: Insufficient documentation

## 2011-05-19 DIAGNOSIS — R0789 Other chest pain: Secondary | ICD-10-CM

## 2011-05-19 DIAGNOSIS — M25519 Pain in unspecified shoulder: Secondary | ICD-10-CM | POA: Insufficient documentation

## 2011-05-19 DIAGNOSIS — Z8673 Personal history of transient ischemic attack (TIA), and cerebral infarction without residual deficits: Secondary | ICD-10-CM | POA: Insufficient documentation

## 2011-05-19 DIAGNOSIS — Z79899 Other long term (current) drug therapy: Secondary | ICD-10-CM | POA: Insufficient documentation

## 2011-05-19 DIAGNOSIS — F341 Dysthymic disorder: Secondary | ICD-10-CM | POA: Insufficient documentation

## 2011-05-19 MED ORDER — HYDROCODONE-ACETAMINOPHEN 5-325 MG PO TABS: 1.0000 | ORAL_TABLET | ORAL | Status: AC | PRN

## 2011-05-19 MED ORDER — ONDANSETRON HCL 4 MG PO TABS
4.0000 mg | ORAL_TABLET | Freq: Once | ORAL | Status: AC
  Administered 2011-05-19: 4 mg via ORAL
  Filled 2011-05-19: qty 1

## 2011-05-19 MED ORDER — HYDROCODONE-ACETAMINOPHEN 5-325 MG PO TABS
1.0000 | ORAL_TABLET | Freq: Once | ORAL | Status: AC
  Administered 2011-05-19: 1 via ORAL
  Filled 2011-05-19: qty 1

## 2011-05-19 MED ORDER — IBUPROFEN 800 MG PO TABS
800.0000 mg | ORAL_TABLET | Freq: Once | ORAL | Status: AC
  Administered 2011-05-19: 800 mg via ORAL
  Filled 2011-05-19: qty 1

## 2011-05-19 NOTE — ED Provider Notes (Signed)
History     CSN: [id number]  Arrival date & time 05/19/11  0009   First MD Initiated Contact with Patient 05/19/11 0110      Chief Complaint  Patient presents with  . Chest Pain    (Consider location/radiation/quality/duration/timing/severity/associated sxs/prior treatment) HPI Brittney Martinez is a 56 y.o. female who presents to the Emergency Department complaining of right-sided upper chest pain that developed this morning and has persisted all day. It is associated with some pain to the right shoulder and down the right arm. She has no known injury and no strenuous activity. Denies fever, chills, shortness of breath, nausea, vomiting, tingling, numbness. She is taking no medicines. Pain seems worse with movement and deep breathing.  PCP Dr. Barnett Abu  Past Medical History  Diagnosis Date  . GERD (gastroesophageal reflux disease)   . TIA (transient ischemic attack)   . IBS (irritable bowel syndrome)   . Diverticulitis   . Asthma   . Anxiety   . Diabetes mellitus   . Hypertension   . Depression     grief  . Anal fissure   . Gout   . RA (rheumatoid arthritis)   . Sleep apnea     cpap    Past Surgical History  Procedure Date  . Cholecystectomy 2003  . Breast lumpectomy     right-benign  . Wrist surgery     left  . Elbow surgery     right  . Finger surgery     on right middle, pointer, and index fingers  . Abdominal hysterectomy 1982    complete  . Esophagogastroduodenoscopy 12/14/09    schatzkis ring 1F, otherwise normal  . Colonoscopy 12/14/09    anal papilla and hemorrhoids,diminutive hperplastic rectal polyps/normal colon  . Nissen fundoplication 2003    [facility]  . Fissurectomy     several    Family History  Problem Relation Age of Onset  . Colon cancer Father 11  . Hypertension Sister   . Hypertension Brother   . Kidney disease Brother     History  Substance Use Topics  . Smoking status: Current Some Day Smoker -- 0.2 packs/day for 19  years    Types: Cigarettes    Last Attempt to Quit: 01/09/2007  . Smokeless tobacco: Former Neurosurgeon    Quit date: 01/09/2008  . Alcohol Use: No    OB History    Grav Para Term Preterm Abortions TAB SAB Ect Mult Living   2 2 2       2       Review of Systems  Constitutional: Negative for fever.       10 Systems reviewed and are negative for acute change except as noted in the HPI.  HENT: Negative for congestion.   Eyes: Negative for discharge and redness.  Respiratory: Negative for cough and shortness of breath.   Cardiovascular: Positive for chest pain.  Gastrointestinal: Negative for vomiting and abdominal pain.  Musculoskeletal: Negative for back pain.  Skin: Negative for rash.  Neurological: Negative for syncope, numbness and headaches.  Psychiatric/Behavioral:       No behavior change.    Allergies  Aspirin; Propoxyphene n-acetaminophen; and Zithromax  Home Medications   Current Outpatient Rx  Name Route Sig Dispense Refill  . BENZONATATE 100 MG PO CAPS Oral Take 100 mg by mouth 3 (three) times daily as needed. cough     . CALCIUM CARBONATE 600 MG PO TABS Oral Take 600 mg by mouth 2 (two) times  daily with a meal.    . CLOPIDOGREL BISULFATE 75 MG PO TABS Oral Take 75 mg by mouth daily.      . CYCLOSPORINE 0.05 % OP EMUL Both Eyes Place 1 drop into both eyes 2 (two) times daily.      . DEXLANSOPRAZOLE 60 MG PO CPDR Oral Take 1 capsule (60 mg total) by mouth daily. 90 capsule 3  . DOXEPIN HCL 75 MG PO CAPS Oral Take 75 mg by mouth at bedtime.      Marland Kitchen GLIPIZIDE ER 5 MG PO TB24 Oral Take 10 mg by mouth 2 (two) times daily.      Marland Kitchen HYDROCHLOROTHIAZIDE 25 MG PO TABS Oral Take 25 mg by mouth daily as needed. For swelling     . METHOTREXATE 2.5 MG PO TABS Oral Take 5 mg by mouth 2 (two) times a week. Caution:Chemotherapy. Protect from light.patient takes on Thursday and Friday     . METOPROLOL SUCCINATE ER 100 MG PO TB24 Oral Take 50 mg by mouth 2 (two) times daily.      Marland Kitchen  SIMVASTATIN 20 MG PO TABS Oral Take 20 mg by mouth at bedtime.      Marland Kitchen VITAMIN D3 400 UNITS PO CAPS Oral Take 1 tablet by mouth daily.      Marland Kitchen HYDROCODONE-ACETAMINOPHEN 5-325 MG PO TABS Oral Take 1 tablet by mouth every 4 (four) hours as needed for pain. 15 tablet 0  . LIDOCAINE-HYDROCORTISONE ACE 3-2.5 % RE KIT Rectal Place 1 kit rectally 2 (two) times daily. 20 each 1  . NYSTATIN-TRIAMCINOLONE 100000-0.1 UNIT/GM-% EX CREA Topical Apply topically 3 (three) times daily. Apply to affected areas of abd/thighs for 2 weeks 30 g 0    BP 103/68  Pulse 86  Temp 98.8 F (37.1 C)  Resp 20  Ht 5' 2.5" (1.588 m)  Wt 204 lb (92.534 kg)  BMI 36.72 kg/m2  SpO2 96%  Physical Exam  Nursing note and vitals reviewed. Constitutional:       Awake, alert, nontoxic appearance with baseline speech for patient.  HENT:  Head: Atraumatic.  Mouth/Throat: No oropharyngeal exudate.  Eyes: EOM are normal. Pupils are equal, round, and reactive to light. Right eye exhibits no discharge. Left eye exhibits no discharge.  Neck: Neck supple.  Cardiovascular: Normal rate and regular rhythm.   No murmur heard. Pulmonary/Chest: Effort normal and breath sounds normal. No stridor. No respiratory distress. She has no wheezes. She has no rales. She exhibits tenderness.       Right sided chest tenderness to palpation.No crepitus or deformity. No lesions.  Abdominal: Soft. Bowel sounds are normal. She exhibits no mass. There is no tenderness. There is no rebound.  Musculoskeletal: She exhibits no tenderness.       Baseline ROM, moves extremities with no obvious new focal weakness.  Lymphadenopathy:    She has no cervical adenopathy.  Neurological:       Awake, alert, cooperative and aware of situation; motor strength bilaterally; sensation normal to light touch bilaterally; peripheral visual fields full to confrontation; no facial asymmetry; tongue midline; major cranial nerves appear intact; no pronator drift, normal finger  to nose bilaterally, baseline gait without new ataxia.  Skin: No rash noted.  Psychiatric: She has a normal mood and affect.    ED Course  Procedures (including critical care time)     Date: 05/19/2011  0017  Rate: 105  Rhythm: sinus tachycardia and premature ventricular contractions (PVC)  QRS Axis: normal  Intervals: normal  ST/T Wave abnormalities: normal  Conduction Disutrbances:none  Narrative Interpretation:   Old EKG Reviewed: changes noted c/w 10/02/09 QT no longer prolonged  Labs Reviewed - No data to display Dg Chest 2 View  05/19/2011  *RADIOLOGY REPORT*  Clinical Data: Chest pain, weakness and shortness of breath.  CHEST - 2 VIEW  Comparison: Chest radiograph performed 08/03/2008  Findings: The lungs are well-aerated and clear.  There is no evidence of focal opacification, pleural effusion or pneumothorax.  The heart is normal in size; calcification is noted at the aortic arch.  No acute osseous abnormalities are seen.  IMPRESSION: No acute cardiopulmonary process seen.  Original Report Authenticated By: Tonia Ghent, M.D.     1. Chest wall pain       MDM  Patient with right-sided chest pain that began today.EKG unremarkable, chest x-ray unremarkable. Given analgesic and anti-inflammatory.Pt stable in ED with no significant deterioration in condition.The patient appears reasonably screened and/or stabilized for discharge and I doubt any other medical condition or other [facility] requiring further screening, evaluation, or treatment in the ED at this time prior to discharge.  MDM Reviewed: nursing note and vitals Interpretation: ECG and x-ray           Nicoletta Dress. Colon Branch, MD 05/19/11 [phone]

## 2011-05-19 NOTE — Discharge Instructions (Signed)
Your chest xray was normal here tonight. Apply heat for comfort. Use the pain medicine as needed. Follow up with your doctor.

## 2011-05-19 NOTE — ED Notes (Signed)
Family at bedside. Patient is comfortable at this time. 

## 2011-05-19 NOTE — ED Notes (Signed)
Chest pressure all day

## 2012-11-19 ENCOUNTER — Other Ambulatory Visit (HOSPITAL_COMMUNITY): Payer: Self-pay | Admitting: Internal Medicine

## 2012-11-19 DIAGNOSIS — Z139 Encounter for screening, unspecified: Secondary | ICD-10-CM

## 2012-12-02 ENCOUNTER — Ambulatory Visit (HOSPITAL_COMMUNITY)
Admission: RE | Admit: 2012-12-02 | Discharge: 2012-12-02 | Disposition: A | Discharge status: Home / Self Care | Payer: Medicare Other | Source: Ambulatory Visit | Attending: Internal Medicine | Admitting: Internal Medicine

## 2012-12-02 DIAGNOSIS — Z139 Encounter for screening, unspecified: Secondary | ICD-10-CM

## 2012-12-02 DIAGNOSIS — Z1231 Encounter for screening mammogram for malignant neoplasm of breast: Secondary | ICD-10-CM | POA: Insufficient documentation

## 2012-12-05 ENCOUNTER — Other Ambulatory Visit: Payer: Self-pay | Admitting: Urgent Care

## 2013-08-26 ENCOUNTER — Encounter: Payer: Self-pay | Admitting: *Deleted

## 2013-09-29 ENCOUNTER — Telehealth: Payer: Self-pay | Admitting: Internal Medicine

## 2013-09-29 ENCOUNTER — Encounter: Payer: Self-pay | Admitting: Gastroenterology

## 2013-09-29 ENCOUNTER — Encounter (INDEPENDENT_AMBULATORY_CARE_PROVIDER_SITE_OTHER): Payer: Self-pay

## 2013-09-29 ENCOUNTER — Ambulatory Visit (INDEPENDENT_AMBULATORY_CARE_PROVIDER_SITE_OTHER): Payer: Managed Care, Other (non HMO) | Admitting: Gastroenterology

## 2013-09-29 VITALS — BP 124/72 | HR 59 | Temp 96.7°F | Ht 62.5 in | Wt 210.4 lb

## 2013-09-29 DIAGNOSIS — K6289 Other specified diseases of anus and rectum: Secondary | ICD-10-CM

## 2013-09-29 DIAGNOSIS — K581 Irritable bowel syndrome with constipation: Secondary | ICD-10-CM | POA: Insufficient documentation

## 2013-09-29 DIAGNOSIS — K589 Irritable bowel syndrome without diarrhea: Secondary | ICD-10-CM

## 2013-09-29 DIAGNOSIS — K219 Gastro-esophageal reflux disease without esophagitis: Secondary | ICD-10-CM

## 2013-09-29 DIAGNOSIS — R195 Other fecal abnormalities: Secondary | ICD-10-CM

## 2013-09-29 DIAGNOSIS — R1013 Epigastric pain: Secondary | ICD-10-CM

## 2013-09-29 DIAGNOSIS — K625 Hemorrhage of anus and rectum: Secondary | ICD-10-CM

## 2013-09-29 MED ORDER — NITROGLYCERIN 0.4 % RE OINT: 1.0000 [in_us] | TOPICAL_OINTMENT | Freq: Two times a day (BID) | RECTAL | Status: DC

## 2013-09-29 MED ORDER — PEG 3350-KCL-NA BICARB-NACL 420 G PO SOLR: 4000.0000 mL | ORAL | Status: DC

## 2013-09-29 NOTE — Progress Notes (Signed)
Primary Care Physician:  Glenda Chroman., MD  Primary Gastroenterologist:  Garfield Cornea, MD   Chief Complaint  Patient presents with  . Rectal Bleeding    HPI:  Brittney Martinez is a 58 y.o. female here at the request of Dr. Helyn Numbers for further evaluation of heme positive stool. She was last seen by our practice in November 2012. She has a history of IBS and GERD. Colonoscopy in October 2011 showed anal papilloma and hemorrhoids, diminutive hyperplastic polyps. With family history of colon cancer she was slated for her upcoming colonoscopy in October 2016.  Was having diarrhea when found the blood in the stool. Alternating constipation/diarrhea but more hard stools. Has some rectal bleeding. ?fissure or hemorrhoids given rectal pain with BMs. Uses OTC cream which helps. Uses bentyl every day (2-3). If bad cramps, then take Levsin. If takes stool softner then has leakage. Heartburn controlled for the most part on Dexilant. No dysphagia. No N/V. Occasional ibuprofen for headache. Complains of pp epigastric pain frequently.   Current Outpatient Prescriptions  Medication Sig Dispense Refill  . albuterol (PROVENTIL HFA;VENTOLIN HFA) 108 (90 BASE) MCG/ACT inhaler Inhale into the lungs every 6 (six) hours as needed for wheezing or shortness of breath.      Marland Kitchen aspirin 81 MG tablet Take 81 mg by mouth daily.      Marland Kitchen atorvastatin (LIPITOR) 10 MG tablet Take 10 mg by mouth daily.      . budesonide-formoterol (SYMBICORT) 160-4.5 MCG/ACT inhaler Inhale 2 puffs into the lungs 2 (two) times daily.      . calcium carbonate (OS-CAL) 600 MG TABS Take 600 mg by mouth 2 (two) times daily with a meal.      . clopidogrel (PLAVIX) 75 MG tablet Take 75 mg by mouth daily.        . cycloSPORINE (RESTASIS) 0.05 % ophthalmic emulsion Place 1 drop into both eyes 2 (two) times daily.        Marland Kitchen dexlansoprazole (DEXILANT) 60 MG capsule Take 1 capsule (60 mg total) by mouth daily.  90 capsule  3  . dicyclomine (BENTYL) 10 MG capsule  Take 10 mg by mouth 4 (four) times daily -  before meals and at bedtime.      Marland Kitchen doxepin (SINEQUAN) 75 MG capsule Take 75 mg by mouth at bedtime.        . folic acid (FOLVITE) 956 MCG tablet Take 400 mcg by mouth daily.      Marland Kitchen glipiZIDE (GLUCOTROL XL) 5 MG 24 hr tablet Take 10 mg by mouth 2 (two) times daily.        . hyoscyamine (LEVSIN, ANASPAZ) 0.125 MG tablet Take 0.125 mg by mouth every 4 (four) hours as needed.      . insulin detemir (LEVEMIR) 100 UNIT/ML injection Inject 44 Units into the skin daily.      Marland Kitchen lisinopril (PRINIVIL,ZESTRIL) 2.5 MG tablet Take 2.5 mg by mouth daily.      . methotrexate (RHEUMATREX) 2.5 MG tablet Take 5 mg by mouth 2 (two) times a week. Caution:Chemotherapy. Protect from light.patient takes on Thursday and Friday Takes 3 tablets on Thurs and three on Fri      . metoCLOPramide (REGLAN) 5 MG tablet Take 5 mg by mouth 3 (three) times daily before meals.      . metoprolol (TOPROL-XL) 100 MG 24 hr tablet Take 50 mg by mouth 2 (two) times daily.        Marland Kitchen Propylene Glycol (SYSTANE BALANCE) 0.6 % SOLN  Apply to eye.       No current facility-administered medications for this visit.    Allergies as of 09/29/2013 - Review Complete 09/29/2013  Allergen Reaction Noted  . Aspirin Nausea Only   . Propoxyphene n-acetaminophen Nausea And Vomiting   . Zithromax [azithromycin] Nausea Only 10/01/2010    Past Medical History  Diagnosis Date  . GERD (gastroesophageal reflux disease)   . TIA (transient ischemic attack)   . IBS (irritable bowel syndrome)   . Diverticulitis   . Asthma   . Anxiety   . Diabetes mellitus   . Hypertension   . Depression     grief  . Anal fissure   . Gout   . RA (rheumatoid arthritis)   . Sleep apnea     cpap  . Abdominal aortic stenosis     Past Surgical History  Procedure Laterality Date  . Cholecystectomy  2003  . Breast lumpectomy      right-benign  . Wrist surgery      left  . Elbow surgery      right  . Finger surgery       on right middle, pointer, and index fingers  . Abdominal hysterectomy  1982    complete  . Esophagogastroduodenoscopy  12/14/09    Dr. Gala Romney :schatzkis ring 85F, otherwise normal  . Colonoscopy  12/14/09    Dr. Gala Romney :anal papilla and hemorrhoids,diminutive hperplastic rectal polyps/normal colon  . Nissen fundoplication  1610    [facility]  . Fissurectomy      several    Family History  Problem Relation Age of Onset  . Colon cancer Father 52  . Hypertension Sister   . Hypertension Brother   . Kidney disease Brother     History   Social History  . Marital Status: Widowed    Spouse Name: N/A    Number of Children: N/A  . Years of Education: N/A   Occupational History  . disabled    Social History Main Topics  . Smoking status: Current Some Day Smoker -- 0.25 packs/day for 19 years    Types: Cigarettes    Last Attempt to Quit: 01/09/2007  . Smokeless tobacco: Former Systems developer    Quit date: 01/09/2008     Comment: Trying to quit/ one pack every 3 days  . Alcohol Use: No  . Drug Use: No  . Sexual Activity: No   Other Topics Concern  . Not on file   Social History Narrative   1 son-healthy   1 daughter-MVA (drunk-driver)      ROS:  General: Negative for anorexia, weight loss, fever, chills, fatigue, weakness. Eyes: Negative for vision changes.  ENT: Negative for hoarseness, difficulty swallowing , nasal congestion. CV: Negative for chest pain, angina, palpitations, dyspnea on exertion, peripheral edema.  Respiratory: Negative for dyspnea at rest, dyspnea on exertion, cough, sputum, wheezing.  GI: See history of present illness. GU:  Negative for dysuria, hematuria, urinary incontinence, urinary frequency, nocturnal urination.  MS: Negative for joint pain, low back pain.  Derm: Negative for rash or itching.  Neuro: Negative for weakness, abnormal sensation, seizure, frequent headaches, memory loss, confusion.  Psych: Negative for anxiety, depression, suicidal  ideation, hallucinations.  Endo: Negative for unusual weight change.  Heme: Negative for bruising or bleeding. Allergy: Negative for rash or hives.    Physical Examination:  BP 124/72  Pulse 59  Temp(Src) 96.7 F (35.9 C) (Oral)  Ht 5' 2.5" (1.588 m)  Wt 210 lb 6.4  oz (95.437 kg)  BMI 37.85 kg/m2   General: Well-nourished, well-developed in no acute distress.  Head: Normocephalic, atraumatic.   Eyes: Conjunctiva pink, no icterus. Mouth: Oropharyngeal mucosa moist and pink , no lesions erythema or exudate. Neck: Supple without thyromegaly, masses, or lymphadenopathy.  Lungs: Clear to auscultation bilaterally.  Heart: Regular rate and rhythm, no murmurs rubs or gallops.  Abdomen: Bowel sounds are normal, moderate epig tenderness, nondistended, no hepatosplenomegaly or masses, no abdominal bruits or    hernia , no rebound or guarding.   Rectal: skin tag at 6 oclock. Tender with moderate digital manipulation both anteriorly/posteriorly. Could not complete full DRE. Extremities: No lower extremity edema. No clubbing or deformities.  Neuro: Alert and oriented x 4 , grossly normal neurologically.  Skin: Warm and dry, no rash or jaundice.   Psych: Alert and cooperative, normal mood and affect.

## 2013-09-29 NOTE — Telephone Encounter (Signed)
Pt was seen today and needs to [facility] her procedure. Please call her back at [phone]

## 2013-09-29 NOTE — Telephone Encounter (Signed)
Done

## 2013-09-29 NOTE — Patient Instructions (Addendum)
1. Colonoscopy and upper endoscopy as scheduled. See separate instructions.  2. Start Rectiv for your rectal pain. Apply one inch strip on a gloved finger and insert into rectum twice daily for 3 weeks. Please call if you have trouble obtaining medication. 3. We need you to move your bowels more gently and avoid straining. Try backing off Bentyl to 1-2 times daily. This may be contributing to your constipation. 4. You may continue the cream for your rectum that you are already using.

## 2013-09-29 NOTE — Assessment & Plan Note (Addendum)
Recent heme positive stool. She has h/o anorectal fissures and recently started having problems again. Takes bentyl and or levsin for IBS. Tries to avoid constipation. Some intermittent rectal bleeding. Suspect recurrent fissure vs. Hemorrhoidal based on exam. Given symptoms and FH CRC, consider colonoscopy at this time. She also complains of pp epigastric pain, worsening. Offer EGD to rule out gastritis/gerd. She will continue dexilant for now.   EGD/TCS.  I have discussed the risks, alternatives, benefits with regards to but not limited to the risk of reaction to medication, bleeding, infection, perforation and the patient is agreeable to proceed. Written consent to be obtained. Start Rectiv. Decrease Bentyl to 1-2 times daily. Keep stools soft but manage diarrhea.

## 2013-10-01 NOTE — Progress Notes (Signed)
cc'd to pcp 

## 2013-10-13 ENCOUNTER — Telehealth: Payer: Self-pay

## 2013-10-13 MED ORDER — NITROGLYCERIN 0.4 % RE OINT: 1.0000 [in_us] | TOPICAL_OINTMENT | Freq: Two times a day (BID) | RECTAL | Status: DC

## 2013-10-13 NOTE — Telephone Encounter (Signed)
Pt is needs the [place] Rx to [facility]. It was sent to [facility]. Please advise

## 2013-10-13 NOTE — Telephone Encounter (Signed)
I tried to call with no answer and voice mail was no set up

## 2013-10-13 NOTE — Telephone Encounter (Signed)
Her bowel prep was sent to [facility] too. Please make sure she is aware of that and let me know if it needs to go to [facility]  RX for Rectiv sent to [facility]

## 2013-10-22 ENCOUNTER — Encounter (HOSPITAL_COMMUNITY): Payer: Self-pay | Admitting: Pharmacy Technician

## 2013-10-26 ENCOUNTER — Other Ambulatory Visit: Payer: Self-pay | Admitting: Internal Medicine

## 2013-10-26 MED ORDER — PEG 3350-KCL-NA BICARB-NACL 420 G PO SOLR: 4000.0000 mL | ORAL | Status: DC

## 2013-10-29 ENCOUNTER — Encounter (HOSPITAL_COMMUNITY)
Admission: RE | Disposition: A | Discharge status: Home / Self Care | Payer: Self-pay | Source: Ambulatory Visit | Attending: Internal Medicine

## 2013-10-29 ENCOUNTER — Ambulatory Visit (HOSPITAL_COMMUNITY)
Admission: RE | Admit: 2013-10-29 | Discharge: 2013-10-29 | Disposition: A | Discharge status: Home / Self Care | Payer: Medicare HMO | Source: Ambulatory Visit | Attending: Internal Medicine | Admitting: Internal Medicine

## 2013-10-29 ENCOUNTER — Encounter (HOSPITAL_COMMUNITY): Payer: Self-pay | Admitting: *Deleted

## 2013-10-29 DIAGNOSIS — F341 Dysthymic disorder: Secondary | ICD-10-CM | POA: Insufficient documentation

## 2013-10-29 DIAGNOSIS — K219 Gastro-esophageal reflux disease without esophagitis: Secondary | ICD-10-CM

## 2013-10-29 DIAGNOSIS — K6289 Other specified diseases of anus and rectum: Secondary | ICD-10-CM | POA: Diagnosis not present

## 2013-10-29 DIAGNOSIS — F17200 Nicotine dependence, unspecified, uncomplicated: Secondary | ICD-10-CM | POA: Insufficient documentation

## 2013-10-29 DIAGNOSIS — R195 Other fecal abnormalities: Secondary | ICD-10-CM

## 2013-10-29 DIAGNOSIS — K921 Melena: Secondary | ICD-10-CM | POA: Diagnosis not present

## 2013-10-29 DIAGNOSIS — Z7982 Long term (current) use of aspirin: Secondary | ICD-10-CM | POA: Insufficient documentation

## 2013-10-29 DIAGNOSIS — Z86010 Personal history of colonic polyps: Secondary | ICD-10-CM

## 2013-10-29 DIAGNOSIS — I10 Essential (primary) hypertension: Secondary | ICD-10-CM | POA: Diagnosis not present

## 2013-10-29 DIAGNOSIS — K589 Irritable bowel syndrome without diarrhea: Secondary | ICD-10-CM

## 2013-10-29 DIAGNOSIS — D126 Benign neoplasm of colon, unspecified: Secondary | ICD-10-CM | POA: Insufficient documentation

## 2013-10-29 DIAGNOSIS — K625 Hemorrhage of anus and rectum: Secondary | ICD-10-CM

## 2013-10-29 DIAGNOSIS — Z7902 Long term (current) use of antithrombotics/antiplatelets: Secondary | ICD-10-CM | POA: Diagnosis not present

## 2013-10-29 DIAGNOSIS — Z9889 Other specified postprocedural states: Secondary | ICD-10-CM | POA: Diagnosis not present

## 2013-10-29 DIAGNOSIS — E119 Type 2 diabetes mellitus without complications: Secondary | ICD-10-CM | POA: Diagnosis not present

## 2013-10-29 DIAGNOSIS — R1013 Epigastric pain: Secondary | ICD-10-CM | POA: Diagnosis not present

## 2013-10-29 DIAGNOSIS — Z794 Long term (current) use of insulin: Secondary | ICD-10-CM | POA: Diagnosis not present

## 2013-10-29 DIAGNOSIS — K648 Other hemorrhoids: Secondary | ICD-10-CM | POA: Insufficient documentation

## 2013-10-29 DIAGNOSIS — Z800 Family history of malignant neoplasm of digestive organs: Secondary | ICD-10-CM | POA: Diagnosis not present

## 2013-10-29 DIAGNOSIS — Z79899 Other long term (current) drug therapy: Secondary | ICD-10-CM | POA: Diagnosis not present

## 2013-10-29 HISTORY — PX: COLONOSCOPY: SHX5424

## 2013-10-29 HISTORY — PX: ESOPHAGOGASTRODUODENOSCOPY: SHX5428

## 2013-10-29 LAB — GLUCOSE, CAPILLARY: Glucose-Capillary: 127 mg/dL — ABNORMAL HIGH (ref 70–99)

## 2013-10-29 SURGERY — COLONOSCOPY
Anesthesia: Moderate Sedation

## 2013-10-29 MED ORDER — SODIUM CHLORIDE 0.9 % IV SOLN
INTRAVENOUS | Status: DC
  Administered 2013-10-29: 12:00:00 via INTRAVENOUS

## 2013-10-29 MED ORDER — MIDAZOLAM HCL 5 MG/5ML IJ SOLN
INTRAMUSCULAR | Status: AC
  Filled 2013-10-29: qty 10

## 2013-10-29 MED ORDER — LIDOCAINE VISCOUS 2 % MT SOLN
OROMUCOSAL | Status: DC | PRN
  Administered 2013-10-29: 3 mL via OROMUCOSAL

## 2013-10-29 MED ORDER — MIDAZOLAM HCL 5 MG/5ML IJ SOLN
INTRAMUSCULAR | Status: DC | PRN
  Administered 2013-10-29 (×3): 1 mg via INTRAVENOUS
  Administered 2013-10-29: 2 mg via INTRAVENOUS

## 2013-10-29 MED ORDER — ONDANSETRON HCL 4 MG/2ML IJ SOLN
INTRAMUSCULAR | Status: AC
  Filled 2013-10-29: qty 2

## 2013-10-29 MED ORDER — MEPERIDINE HCL 100 MG/ML IJ SOLN
INTRAMUSCULAR | Status: DC
Start: 2013-10-29 — End: 2013-10-29
  Filled 2013-10-29: qty 2

## 2013-10-29 MED ORDER — LIDOCAINE VISCOUS 2 % MT SOLN
OROMUCOSAL | Status: AC
  Filled 2013-10-29: qty 15

## 2013-10-29 MED ORDER — ONDANSETRON HCL 4 MG/2ML IJ SOLN
INTRAMUSCULAR | Status: DC | PRN
  Administered 2013-10-29: 4 mg via INTRAVENOUS

## 2013-10-29 MED ORDER — MEPERIDINE HCL 100 MG/ML IJ SOLN
INTRAMUSCULAR | Status: DC | PRN
  Administered 2013-10-29: 25 mg via INTRAVENOUS
  Administered 2013-10-29: 50 mg via INTRAVENOUS

## 2013-10-29 MED ORDER — STERILE WATER FOR IRRIGATION IR SOLN
Status: DC | PRN
  Administered 2013-10-29: 13:00:00

## 2013-10-29 NOTE — Interval H&P Note (Signed)
History and Physical Interval Note:  10/29/2013 12:31 PM  Dian Situ  has presented today for surgery, with the diagnosis of HEME POSITIVE STOOL, RECTAL PAIN, RECTAL BLEED, IBS, EPIGASTRIC PAIN  The various methods of treatment have been discussed with the patient and family. After consideration of risks, benefits and other options for treatment, the patient has consented to  Procedure(s) with comments: COLONOSCOPY (N/A) - 730-rescheduled to 12:00 Darius Bump notified pt ESOPHAGOGASTRODUODENOSCOPY (EGD) (N/A) as a surgical intervention .  The patient's history has been reviewed, patient examined, no change in status, stable for surgery.  I have reviewed the patient's chart and labs.  Questions were answered to the patient's satisfaction.     Manus Rudd  Did not get nitroglycerin ointment prescription. [facility] pharmacy never called. EGD and colonoscopy per plan.  The risks, benefits, limitations, imponderables and alternatives regarding both EGD and colonoscopy have been reviewed with the patient. Questions have been answered. All parties agreeable.

## 2013-10-29 NOTE — Op Note (Signed)
[facility] [address] [place], [address]   COLONOSCOPY PROCEDURE REPORT  PATIENT: Brittney Martinez, Brittney Martinez  MR#:         [id number] BIRTHDATE: Feb 21, 1956 , 58  yrs. old GENDER: Female ENDOSCOPIST: R.  Garfield Cornea, MD FACP FACG REFERRED BY:  Jerene Bears, M.D. PROCEDURE DATE:  10/29/2013 PROCEDURE:     Ileocolonoscopy with biopsy  INDICATIONS: Hemoccult-positive stool; paper hematochezia - did not get nitroglycerin ointment filled as previously recommended  INFORMED CONSENT:  The risks, benefits, alternatives and imponderables including but not limited to bleeding, perforation as well as the possibility of a missed lesion have been reviewed.  The potential for biopsy, lesion removal, etc. have also been discussed.  Questions have been answered.  All parties agreeable. Please see the history and physical in the medical record for more information.  MEDICATIONS: Versed 5 mg IV and Demerol 75 mg IV in divided doses. Zofran 4 mg IV.  DESCRIPTION OF PROCEDURE:  After a digital rectal exam was performed, the EG-[id number] ([id number]) and EC-[id number] ([id number]) colonoscope was advanced from the anus through the rectum and colon to the area of the cecum, ileocecal valve and appendiceal orifice. The cecum was deeply intubated.  These structures were well-seen and photographed for the record.  From the level of the cecum and ileocecal valve, the scope was slowly and cautiously withdrawn. The mucosal surfaces were carefully surveyed utilizing scope tip deflection to facilitate fold flattening as needed.  The scope was pulled down into the rectum where a thorough examination including retroflexion was performed.    FINDINGS:  anal papilla internal hemorrhoids; otherwise normal rectum. (1) diminutive cecal polyp in (1) diminutive sigmoid polyp; otherwise, the remainder of the colonic mucosa appeared normal. The distal 5 cm of terminal ileum mucosa also appeared normal  THERAPEUTIC /  DIAGNOSTIC MANEUVERS PERFORMED:  the above-mentioned polyps were cold biopsied/removed  COMPLICATIONS: none  CECAL WITHDRAWAL TIME:  11 minutes  IMPRESSION:  Anal papilla and internal hemorrhoids; colonic polyps-removed as described above. I suspect benign anorectal bleeding in the setting of hemorrhoids and possibly an occult fissure.  RECOMMENDATIONS: Follow up on pathology. Begin 0.4% nitroglycerin ointment to the anorectum as previously recommended-I have provided a prescription today.  May benefit from hemorrhoid banding.   _______________________________ eSigned:  R. Garfield Cornea, MD FACP [facility] 10/29/2013 1:22 PM   CC:

## 2013-10-29 NOTE — H&P (View-Only) (Signed)
Primary Care Physician:  Glenda Chroman., MD  Primary Gastroenterologist:  Garfield Cornea, MD   Chief Complaint  Patient presents with  . Rectal Bleeding    HPI:  Brittney Martinez is a 58 y.o. female here at the request of Dr. Helyn Numbers for further evaluation of heme positive stool. She was last seen by our practice in November 2012. She has a history of IBS and GERD. Colonoscopy in October 2011 showed anal papilloma and hemorrhoids, diminutive hyperplastic polyps. With family history of colon cancer she was slated for her upcoming colonoscopy in October 2016.  Was having diarrhea when found the blood in the stool. Alternating constipation/diarrhea but more hard stools. Has some rectal bleeding. ?fissure or hemorrhoids given rectal pain with BMs. Uses OTC cream which helps. Uses bentyl every day (2-3). If bad cramps, then take Levsin. If takes stool softner then has leakage. Heartburn controlled for the most part on Dexilant. No dysphagia. No N/V. Occasional ibuprofen for headache. Complains of pp epigastric pain frequently.   Current Outpatient Prescriptions  Medication Sig Dispense Refill  . albuterol (PROVENTIL HFA;VENTOLIN HFA) 108 (90 BASE) MCG/ACT inhaler Inhale into the lungs every 6 (six) hours as needed for wheezing or shortness of breath.      Marland Kitchen aspirin 81 MG tablet Take 81 mg by mouth daily.      Marland Kitchen atorvastatin (LIPITOR) 10 MG tablet Take 10 mg by mouth daily.      . budesonide-formoterol (SYMBICORT) 160-4.5 MCG/ACT inhaler Inhale 2 puffs into the lungs 2 (two) times daily.      . calcium carbonate (OS-CAL) 600 MG TABS Take 600 mg by mouth 2 (two) times daily with a meal.      . clopidogrel (PLAVIX) 75 MG tablet Take 75 mg by mouth daily.        . cycloSPORINE (RESTASIS) 0.05 % ophthalmic emulsion Place 1 drop into both eyes 2 (two) times daily.        Marland Kitchen dexlansoprazole (DEXILANT) 60 MG capsule Take 1 capsule (60 mg total) by mouth daily.  90 capsule  3  . dicyclomine (BENTYL) 10 MG capsule  Take 10 mg by mouth 4 (four) times daily -  before meals and at bedtime.      Marland Kitchen doxepin (SINEQUAN) 75 MG capsule Take 75 mg by mouth at bedtime.        . folic acid (FOLVITE) 119 MCG tablet Take 400 mcg by mouth daily.      Marland Kitchen glipiZIDE (GLUCOTROL XL) 5 MG 24 hr tablet Take 10 mg by mouth 2 (two) times daily.        . hyoscyamine (LEVSIN, ANASPAZ) 0.125 MG tablet Take 0.125 mg by mouth every 4 (four) hours as needed.      . insulin detemir (LEVEMIR) 100 UNIT/ML injection Inject 44 Units into the skin daily.      Marland Kitchen lisinopril (PRINIVIL,ZESTRIL) 2.5 MG tablet Take 2.5 mg by mouth daily.      . methotrexate (RHEUMATREX) 2.5 MG tablet Take 5 mg by mouth 2 (two) times a week. Caution:Chemotherapy. Protect from light.patient takes on Thursday and Friday Takes 3 tablets on Thurs and three on Fri      . metoCLOPramide (REGLAN) 5 MG tablet Take 5 mg by mouth 3 (three) times daily before meals.      . metoprolol (TOPROL-XL) 100 MG 24 hr tablet Take 50 mg by mouth 2 (two) times daily.        Marland Kitchen Propylene Glycol (SYSTANE BALANCE) 0.6 % SOLN  Apply to eye.       No current facility-administered medications for this visit.    Allergies as of 09/29/2013 - Review Complete 09/29/2013  Allergen Reaction Noted  . Aspirin Nausea Only   . Propoxyphene n-acetaminophen Nausea And Vomiting   . Zithromax [azithromycin] Nausea Only 10/01/2010    Past Medical History  Diagnosis Date  . GERD (gastroesophageal reflux disease)   . TIA (transient ischemic attack)   . IBS (irritable bowel syndrome)   . Diverticulitis   . Asthma   . Anxiety   . Diabetes mellitus   . Hypertension   . Depression     grief  . Anal fissure   . Gout   . RA (rheumatoid arthritis)   . Sleep apnea     cpap  . Abdominal aortic stenosis     Past Surgical History  Procedure Laterality Date  . Cholecystectomy  2003  . Breast lumpectomy      right-benign  . Wrist surgery      left  . Elbow surgery      right  . Finger surgery       on right middle, pointer, and index fingers  . Abdominal hysterectomy  1982    complete  . Esophagogastroduodenoscopy  12/14/09    Dr. Gala Romney :schatzkis ring 24F, otherwise normal  . Colonoscopy  12/14/09    Dr. Gala Romney :anal papilla and hemorrhoids,diminutive hperplastic rectal polyps/normal colon  . Nissen fundoplication  6967    [facility]  . Fissurectomy      several    Family History  Problem Relation Age of Onset  . Colon cancer Father 52  . Hypertension Sister   . Hypertension Brother   . Kidney disease Brother     History   Social History  . Marital Status: Widowed    Spouse Name: N/A    Number of Children: N/A  . Years of Education: N/A   Occupational History  . disabled    Social History Main Topics  . Smoking status: Current Some Day Smoker -- 0.25 packs/day for 19 years    Types: Cigarettes    Last Attempt to Quit: 01/09/2007  . Smokeless tobacco: Former Systems developer    Quit date: 01/09/2008     Comment: Trying to quit/ one pack every 3 days  . Alcohol Use: No  . Drug Use: No  . Sexual Activity: No   Other Topics Concern  . Not on file   Social History Narrative   1 son-healthy   1 daughter-MVA (drunk-driver)      ROS:  General: Negative for anorexia, weight loss, fever, chills, fatigue, weakness. Eyes: Negative for vision changes.  ENT: Negative for hoarseness, difficulty swallowing , nasal congestion. CV: Negative for chest pain, angina, palpitations, dyspnea on exertion, peripheral edema.  Respiratory: Negative for dyspnea at rest, dyspnea on exertion, cough, sputum, wheezing.  GI: See history of present illness. GU:  Negative for dysuria, hematuria, urinary incontinence, urinary frequency, nocturnal urination.  MS: Negative for joint pain, low back pain.  Derm: Negative for rash or itching.  Neuro: Negative for weakness, abnormal sensation, seizure, frequent headaches, memory loss, confusion.  Psych: Negative for anxiety, depression, suicidal  ideation, hallucinations.  Endo: Negative for unusual weight change.  Heme: Negative for bruising or bleeding. Allergy: Negative for rash or hives.    Physical Examination:  BP 124/72  Pulse 59  Temp(Src) 96.7 F (35.9 C) (Oral)  Ht 5' 2.5" (1.588 m)  Wt 210 lb 6.4  oz (95.437 kg)  BMI 37.85 kg/m2   General: Well-nourished, well-developed in no acute distress.  Head: Normocephalic, atraumatic.   Eyes: Conjunctiva pink, no icterus. Mouth: Oropharyngeal mucosa moist and pink , no lesions erythema or exudate. Neck: Supple without thyromegaly, masses, or lymphadenopathy.  Lungs: Clear to auscultation bilaterally.  Heart: Regular rate and rhythm, no murmurs rubs or gallops.  Abdomen: Bowel sounds are normal, moderate epig tenderness, nondistended, no hepatosplenomegaly or masses, no abdominal bruits or    hernia , no rebound or guarding.   Rectal: skin tag at 6 oclock. Tender with moderate digital manipulation both anteriorly/posteriorly. Could not complete full DRE. Extremities: No lower extremity edema. No clubbing or deformities.  Neuro: Alert and oriented x 4 , grossly normal neurologically.  Skin: Warm and dry, no rash or jaundice.   Psych: Alert and cooperative, normal mood and affect.

## 2013-10-29 NOTE — Op Note (Signed)
[facility] [address] [place], [address]   ENDOSCOPY PROCEDURE REPORT  PATIENT: Brittney Martinez, Brittney Martinez  MR#: [id number] BIRTHDATE: Aug 12, 1955 , 58  yrs. old GENDER: Female ENDOSCOPIST: R.  Garfield Cornea, MD FACP FACG REFERRED BY:  Jerene Bears, M.D. PROCEDURE DATE:  10/29/2013 PROCEDURE:     EGD-diagnostic  INDICATIONS:   epigastric pain  INFORMED CONSENT:   The risks, benefits, limitations, alternatives and imponderables have been discussed.  The potential for biopsy, esophogeal dilation, etc. have also been reviewed.  Questions have been answered.  All parties agreeable.  Please see the history and physical in the medical record for more information.  MEDICATIONS:      Versed 4 mg IV and Demerol 75 mg IV in divided doses. Xylocaine gel orally. Zofran 4 mg IV.  DESCRIPTION OF PROCEDURE:   The EG-[id number] ([id number])  endoscope was introduced through the mouth and advanced to the second portion of the duodenum without difficulty or limitations.  The mucosal surfaces were surveyed very carefully during advancement of the scope and upon withdrawal.  Retroflexion view of the proximal stomach and esophagogastric junction was performed.      FINDINGS: Small shallow pseudodiverticulum distal esophagus otherwise normal esophagus. Stomach empty. Loose but intact Nissen fundoplication. Gastric mucosa appeared normal. Patent pylorus. Normal first and second portion of the duodenum  THERAPEUTIC / DIAGNOSTIC MANEUVERS PERFORMED:   COMPLICATIONS:  None  IMPRESSION:  Distal esophageal pseudodiverticulum. Loose but intact Nissen fundoplication  RECOMMENDATIONS:   See colonoscopy report.    _______________________________ R. Garfield Cornea, MD FACP [facility] eSigned:  R. Garfield Cornea, MD FACP [facility] 10/29/2013 12:57 PM     CC:  PATIENT NAME:  Brittney Martinez, Brittney Martinez MR#: [id number]

## 2013-10-29 NOTE — Discharge Instructions (Signed)
Colonoscopy Discharge Instructions  Read the instructions outlined below and refer to this sheet in the next few weeks. These discharge instructions provide you with general information on caring for yourself after you leave the hospital. Your doctor may also give you specific instructions. While your treatment has been planned according to the most current medical practices available, unavoidable complications occasionally occur. If you have any problems or questions after discharge, call Dr. Gala Romney at [phone]. ACTIVITY  You may resume your regular activity, but move at a slower pace for the next 24 hours.   Take frequent rest periods for the next 24 hours.   Walking will help get rid of the air and reduce the bloated feeling in your belly (abdomen).   No driving for 24 hours (because of the medicine (anesthesia) used during the test).    Do not sign any important legal documents or operate any machinery for 24 hours (because of the anesthesia used during the test).  NUTRITION  Drink plenty of fluids.   You may resume your normal diet as instructed by your doctor.   Begin with a light meal and progress to your normal diet. Heavy or fried foods are harder to digest and may make you feel sick to your stomach (nauseated).   Avoid alcoholic beverages for 24 hours or as instructed.  MEDICATIONS  You may resume your normal medications unless your doctor tells you otherwise.  WHAT YOU CAN EXPECT TODAY  Some feelings of bloating in the abdomen.   Passage of more gas than usual.   Spotting of blood in your stool or on the toilet paper.  IF YOU HAD POLYPS REMOVED DURING THE COLONOSCOPY:  No aspirin products for 7 days or as instructed.   No alcohol for 7 days or as instructed.   Eat a soft diet for the next 24 hours.  FINDING OUT THE RESULTS OF YOUR TEST Not all test results are available during your visit. If your test results are not back during the visit, make an appointment  with your caregiver to find out the results. Do not assume everything is normal if you have not heard from your caregiver or the medical facility. It is important for you to follow up on all of your test results.  SEEK IMMEDIATE MEDICAL ATTENTION IF:  You have more than a spotting of blood in your stool.   Your belly is swollen (abdominal distention).   You are nauseated or vomiting.   You have a temperature over 101.  You have abdominal pain or discomfort that is severe or gets worse throughout the day. EGD Discharge instructions Please read the instructions outlined below and refer to this sheet in the next few weeks. These discharge instructions provide you with general information on caring for yourself after you leave the hospital. Your doctor may also give you specific instructions. While your treatment has been planned according to the most current medical practices available, unavoidable complications occasionally occur. If you have any problems or questions after discharge, please call your doctor. ACTIVITY You may resume your regular activity but move at a slower pace for the next 24 hours.  Take frequent rest periods for the next 24 hours.  Walking will help expel (get rid of) the air and reduce the bloated feeling in your abdomen.  No driving for 24 hours (because of the anesthesia (medicine) used during the test).  You may shower.  Do not sign any important legal documents or operate any machinery for 24  hours (because of the anesthesia used during the test).  NUTRITION Drink plenty of fluids.  You may resume your normal diet.  Begin with a light meal and progress to your normal diet.  Avoid alcoholic beverages for 24 hours or as instructed by your caregiver.  MEDICATIONS You may resume your normal medications unless your caregiver tells you otherwise.  WHAT YOU CAN EXPECT TODAY You may experience abdominal discomfort such as a feeling of fullness or gas pains.   FOLLOW-UP Your doctor will discuss the results of your test with you.  SEEK IMMEDIATE MEDICAL ATTENTION IF ANY OF THE FOLLOWING OCCUR: Excessive nausea (feeling sick to your stomach) and/or vomiting.  Severe abdominal pain and distention (swelling).  Trouble swallowing.  Temperature over 101 F (37.8 C).  Rectal bleeding or vomiting of blood.     Continue Dexilant 60 mg daily  GERD and polyp information provided.  Begin 0.4% nitroglycerin ointment applied to the anorectum twice daily as previously recommended  I have provided a prescription today  May benefit from hemorrhoid banding  Office visit with [place] in 4-6 weeks  Followup on pathology.   Colon Polyps Polyps are lumps of extra tissue growing inside the body. Polyps can grow in the large intestine (colon). Most colon polyps are noncancerous (benign). However, some colon polyps can become cancerous over time. Polyps that are larger than a pea may be harmful. To be safe, caregivers remove and test all polyps. CAUSES  Polyps form when mutations in the genes cause your cells to grow and divide even though no more tissue is needed. RISK FACTORS There are a number of risk factors that can increase your chances of getting colon polyps. They include:  Being older than 50 years.  Family history of colon polyps or colon cancer.  Long-term colon diseases, such as colitis or Crohn disease.  Being overweight.  Smoking.  Being inactive.  Drinking too much alcohol. SYMPTOMS  Most small polyps do not cause symptoms. If symptoms are present, they may include:  Blood in the stool. The stool may look dark red or black.  Constipation or diarrhea that lasts longer than 1 week. DIAGNOSIS People often do not know they have polyps until their caregiver finds them during a regular checkup. Your caregiver can use 4 tests to check for polyps:  Digital rectal exam. The caregiver wears gloves and feels inside the rectum. This test  would find polyps only in the rectum.  Barium enema. The caregiver puts a liquid called barium into your rectum before taking X-rays of your colon. Barium makes your colon look white. Polyps are dark, so they are easy to see in the X-ray pictures.  Sigmoidoscopy. A thin, flexible tube (sigmoidoscope) is placed into your rectum. The sigmoidoscope has a light and tiny camera in it. The caregiver uses the sigmoidoscope to look at the last third of your colon.  Colonoscopy. This test is like sigmoidoscopy, but the caregiver looks at the entire colon. This is the most common method for finding and removing polyps. TREATMENT  Any polyps will be removed during a sigmoidoscopy or colonoscopy. The polyps are then tested for cancer. PREVENTION  To help lower your risk of getting more colon polyps:  Eat plenty of fruits and vegetables. Avoid eating fatty foods.  Do not smoke.  Avoid drinking alcohol.  Exercise every day.  Lose weight if recommended by your caregiver.  Eat plenty of calcium and folate. Foods that are rich in calcium include milk, cheese, and broccoli.  Foods that are rich in folate include chickpeas, kidney beans, and spinach. HOME CARE INSTRUCTIONS Keep all follow-up appointments as directed by your caregiver. You may need periodic exams to check for polyps. SEEK MEDICAL CARE IF: You notice bleeding during a bowel movement. Document Released: 11/16/2003 Document Revised: 05/14/2011 Document Reviewed: 05/01/2011 [facility] Patient Information 2015 [place], [place]. This information is not intended to replace advice given to you by your health care provider. Make sure you discuss any questions you have with your health care provider. Gastroesophageal Reflux Disease, Adult Gastroesophageal reflux disease (GERD) happens when acid from your stomach flows up into the esophagus. When acid comes in contact with the esophagus, the acid causes soreness (inflammation) in the esophagus. Over  time, GERD may create small holes (ulcers) in the lining of the esophagus. CAUSES   Increased body weight. This puts pressure on the stomach, making acid rise from the stomach into the esophagus.  Smoking. This increases acid production in the stomach.  Drinking alcohol. This causes decreased pressure in the lower esophageal sphincter (valve or ring of muscle between the esophagus and stomach), allowing acid from the stomach into the esophagus.  Late evening meals and a full stomach. This increases pressure and acid production in the stomach.  A malformed lower esophageal sphincter. Sometimes, no cause is found. SYMPTOMS   Burning pain in the lower part of the mid-chest behind the breastbone and in the mid-stomach area. This may occur twice a week or more often.  Trouble swallowing.  Sore throat.  Dry cough.  Asthma-like symptoms including chest tightness, shortness of breath, or wheezing. DIAGNOSIS  Your caregiver may be able to diagnose GERD based on your symptoms. In some cases, X-rays and other tests may be done to check for complications or to check the condition of your stomach and esophagus. TREATMENT  Your caregiver may recommend over-the-counter or prescription medicines to help decrease acid production. Ask your caregiver before starting or adding any new medicines.  HOME CARE INSTRUCTIONS   Change the factors that you can control. Ask your caregiver for guidance concerning weight loss, quitting smoking, and alcohol consumption.  Avoid foods and drinks that make your symptoms worse, such as:  Caffeine or alcoholic drinks.  Chocolate.  Peppermint or mint flavorings.  Garlic and onions.  Spicy foods.  Citrus fruits, such as oranges, lemons, or limes.  Tomato-based foods such as sauce, chili, salsa, and pizza.  Fried and fatty foods.  Avoid lying down for the 3 hours prior to your bedtime or prior to taking a nap.  Eat small, frequent meals instead of large  meals.  Wear loose-fitting clothing. Do not wear anything tight around your waist that causes pressure on your stomach.  Raise the head of your bed 6 to 8 inches with wood blocks to help you sleep. Extra pillows will not help.  Only take over-the-counter or prescription medicines for pain, discomfort, or fever as directed by your caregiver.  Do not take aspirin, ibuprofen, or other nonsteroidal anti-inflammatory drugs (NSAIDs). SEEK IMMEDIATE MEDICAL CARE IF:   You have pain in your arms, neck, jaw, teeth, or back.  Your pain increases or changes in intensity or duration.  You develop nausea, vomiting, or sweating (diaphoresis).  You develop shortness of breath, or you faint.  Your vomit is green, yellow, black, or looks like coffee grounds or blood.  Your stool is red, bloody, or black. These symptoms could be signs of other problems, such as heart disease, gastric bleeding, or esophageal  bleeding. MAKE SURE YOU:   Understand these instructions.  Will watch your condition.  Will get help right away if you are not doing well or get worse. Document Released: 11/29/2004 Document Revised: 05/14/2011 Document Reviewed: 09/08/2010 [facility] Patient Information 2015 [place], [place]. This information is not intended to replace advice given to you by your health care provider. Make sure you discuss any questions you have with your health care provider.

## 2013-10-30 ENCOUNTER — Encounter (HOSPITAL_COMMUNITY): Payer: Self-pay | Admitting: Internal Medicine

## 2013-10-30 ENCOUNTER — Encounter: Payer: Self-pay | Admitting: Internal Medicine

## 2013-11-30 ENCOUNTER — Encounter: Payer: Self-pay | Admitting: Gastroenterology

## 2013-11-30 ENCOUNTER — Ambulatory Visit (INDEPENDENT_AMBULATORY_CARE_PROVIDER_SITE_OTHER): Payer: Managed Care, Other (non HMO) | Admitting: Gastroenterology

## 2013-11-30 VITALS — BP 122/64 | HR 60 | Temp 98.4°F | Ht 65.0 in | Wt 206.6 lb

## 2013-11-30 DIAGNOSIS — K602 Anal fissure, unspecified: Secondary | ICD-10-CM | POA: Insufficient documentation

## 2013-11-30 DIAGNOSIS — K219 Gastro-esophageal reflux disease without esophagitis: Secondary | ICD-10-CM

## 2013-11-30 MED ORDER — POLYETHYLENE GLYCOL 3350 17 GM/SCOOP PO POWD: ORAL | Status: DC

## 2013-11-30 MED ORDER — PANTOPRAZOLE SODIUM 40 MG PO TBEC: 40.0000 mg | DELAYED_RELEASE_TABLET | Freq: Two times a day (BID) | ORAL | Status: DC

## 2013-11-30 NOTE — Progress Notes (Signed)
Primary Care Physician: Glenda Chroman., MD  Primary Gastroenterologist:  Garfield Cornea, MD   Chief Complaint  Patient presents with  . Follow-up    doing ok    HPI: Brittney Martinez is a 58 y.o. female here for followup of recent EGD and colonoscopy. She was noted to have heme positive stool, rectal bleeding/rectal pain. Colonoscopy showed anal papilloma and internal hemorrhoids, diminutive cecal tubular adenoma. Internal hemorrhoids. Suspected benign anorectal bleeding in the setting of hemorrhoids and questionable occult fissure. Started NTG 0.4% ointment approximately 3 times daily. Generally uses after each bowel movement. Bowel movements are soft, 2-3 times daily. NTG ointment for a few weeks. No significant abdominal pain. Continues to have some discomfort with bowel movements and occasional bleeding. Endoscopy performed for epigastric pain showed distal esophageal pseudodiverticulum, loose but intact Nissen fundoplication. Approximately twice per week feels discomfort in the substernal region with meals. Takes Dexilant twice per day at times which seems to help. Bentyl helps cramps/diarrhea. H/O IBS.   Previously failed Nexium, Prevacid, Prilosec.  Current Outpatient Prescriptions  Medication Sig Dispense Refill  . albuterol (PROVENTIL HFA;VENTOLIN HFA) 108 (90 BASE) MCG/ACT inhaler Inhale into the lungs every 6 (six) hours as needed for wheezing or shortness of breath.      Marland Kitchen aspirin 81 MG tablet Take 81 mg by mouth daily.      Marland Kitchen atorvastatin (LIPITOR) 10 MG tablet Take 10 mg by mouth daily.      . budesonide-formoterol (SYMBICORT) 160-4.5 MCG/ACT inhaler Inhale 2 puffs into the lungs 2 (two) times daily.      . calcium carbonate (OS-CAL) 600 MG TABS Take 600 mg by mouth 2 (two) times daily with a meal.      . clopidogrel (PLAVIX) 75 MG tablet Take 75 mg by mouth daily.        . cycloSPORINE (RESTASIS) 0.05 % ophthalmic emulsion Place 1 drop into both eyes 2 (two) times daily.         Marland Kitchen dexlansoprazole (DEXILANT) 60 MG capsule Take 1 capsule (60 mg total) by mouth daily.  90 capsule  3  . dicyclomine (BENTYL) 10 MG capsule Take 10 mg by mouth 4 (four) times daily -  before meals and at bedtime.      Marland Kitchen doxepin (SINEQUAN) 75 MG capsule Take 75 mg by mouth at bedtime.        . folic acid (FOLVITE) 035 MCG tablet Take 400 mcg by mouth daily.      Marland Kitchen glipiZIDE (GLUCOTROL XL) 5 MG 24 hr tablet Take 10 mg by mouth 2 (two) times daily.        . hyoscyamine (LEVSIN, ANASPAZ) 0.125 MG tablet Take 0.125 mg by mouth every 4 (four) hours as needed.      . insulin detemir (LEVEMIR) 100 UNIT/ML injection Inject 44 Units into the skin daily.      Marland Kitchen lisinopril (PRINIVIL,ZESTRIL) 2.5 MG tablet Take 2.5 mg by mouth daily.      . methotrexate (RHEUMATREX) 2.5 MG tablet Take 5 mg by mouth 2 (two) times a week. Caution:Chemotherapy. Protect from light.patient takes on Thursday and Friday Takes 3 tablets on Thurs and three on Fri      . metoCLOPramide (REGLAN) 5 MG tablet Take 5 mg by mouth 3 (three) times daily before meals.      . metoprolol (TOPROL-XL) 100 MG 24 hr tablet Take 50 mg by mouth 2 (two) times daily.        Marland Kitchen  Nitroglycerin (RECTIV) 0.4 % OINT Place 1 inch rectally 2 (two) times daily. For 3 weeks. Use glove.  30 g  0   No current facility-administered medications for this visit.    Allergies as of 11/30/2013 - Review Complete 11/30/2013  Allergen Reaction Noted  . Aspirin Nausea Only   . Propoxyphene n-acetaminophen Nausea And Vomiting   . Zithromax [azithromycin] Nausea Only 10/01/2010    ROS:  General: Negative for anorexia, weight loss, fever, chills, fatigue, weakness. ENT: Negative for hoarseness, difficulty swallowing , nasal congestion. CV: Negative for chest pain, angina, palpitations, dyspnea on exertion, peripheral edema.  Respiratory: Negative for dyspnea at rest, dyspnea on exertion, cough, sputum, wheezing.  GI: See history of present illness. GU:  Negative  for dysuria, hematuria, urinary incontinence, urinary frequency, nocturnal urination.  Endo: Negative for unusual weight change.    Physical Examination:   BP 122/64  Pulse 60  Temp(Src) 98.4 F (36.9 C) (Oral)  Ht 5\' 5"  (1.651 m)  Wt 206 lb 9.6 oz (93.713 kg)  BMI 34.38 kg/m2  General: Well-nourished, well-developed in no acute distress.  Eyes: No icterus. Mouth: Oropharyngeal mucosa moist and pink , no lesions erythema or exudate. Lungs: Clear to auscultation bilaterally.  Heart: Regular rate and rhythm, no murmurs rubs or gallops.  Abdomen: Bowel sounds are normal, nontender, nondistended, no hepatosplenomegaly or masses, no abdominal bruits or hernia , no rebound or guarding.   Extremities: No lower extremity edema. No clubbing or deformities. Neuro: Alert and oriented x 4   Skin: Warm and dry, no jaundice.   Psych: Alert and cooperative, normal mood and affect.

## 2013-11-30 NOTE — Patient Instructions (Signed)
1. Stop Dexilant. 2. Start pantoprazole 40mg  twice daily. 3. Continue nitro ointment per rectum twice per day. 4. miralax one capful daily as needed for constipation. 5. Return for follow up in 8 weeks or call sooner if needed.

## 2013-12-01 ENCOUNTER — Encounter: Payer: Self-pay | Admitting: Gastroenterology

## 2013-12-01 NOTE — Assessment & Plan Note (Addendum)
Continues to have some substernal discomfort. Better with BID Dexilant but advised we should change regimen if BID dosing needed. Trial of pantoprazole 40mg  BID. OV in 8 weeks or sooner if needed.

## 2013-12-01 NOTE — Assessment & Plan Note (Addendum)
Likely persistent fissure. Continue nitroglycerin ointment, BID. Premedicate with Tylenol to prevent headache. Keep stools soft. Add miralax one capful daily if needed. Use bentyl prn only. OV in 8 weeks for follow up.

## 2013-12-02 NOTE — Progress Notes (Signed)
cc'ed to pcp °

## 2013-12-30 ENCOUNTER — Encounter: Payer: Self-pay | Admitting: Internal Medicine

## 2014-01-04 ENCOUNTER — Encounter: Payer: Self-pay | Admitting: Gastroenterology

## 2014-02-01 ENCOUNTER — Ambulatory Visit: Payer: Managed Care, Other (non HMO) | Admitting: Gastroenterology

## 2014-02-05 ENCOUNTER — Ambulatory Visit (INDEPENDENT_AMBULATORY_CARE_PROVIDER_SITE_OTHER): Payer: Medicare HMO | Admitting: Gastroenterology

## 2014-02-05 ENCOUNTER — Encounter: Payer: Self-pay | Admitting: Gastroenterology

## 2014-02-05 VITALS — BP 141/74 | HR 59 | Temp 97.2°F | Wt 203.8 lb

## 2014-02-05 DIAGNOSIS — K5909 Other constipation: Secondary | ICD-10-CM

## 2014-02-05 DIAGNOSIS — K760 Fatty (change of) liver, not elsewhere classified: Secondary | ICD-10-CM | POA: Insufficient documentation

## 2014-02-05 DIAGNOSIS — K589 Irritable bowel syndrome without diarrhea: Secondary | ICD-10-CM

## 2014-02-05 DIAGNOSIS — K429 Umbilical hernia without obstruction or gangrene: Secondary | ICD-10-CM

## 2014-02-05 DIAGNOSIS — K649 Unspecified hemorrhoids: Secondary | ICD-10-CM | POA: Insufficient documentation

## 2014-02-05 DIAGNOSIS — K219 Gastro-esophageal reflux disease without esophagitis: Secondary | ICD-10-CM

## 2014-02-05 MED ORDER — LUBIPROSTONE 8 MCG PO CAPS: 8.0000 ug | ORAL_CAPSULE | Freq: Two times a day (BID) | ORAL | Status: DC

## 2014-02-05 MED ORDER — ESOMEPRAZOLE MAGNESIUM 40 MG PO CPDR: 40.0000 mg | DELAYED_RELEASE_CAPSULE | Freq: Every day | ORAL | Status: DC

## 2014-02-05 NOTE — Progress Notes (Signed)
Primary Care Physician: Glenda Chroman., MD  Primary Gastroenterologist:  Garfield Cornea, MD   Chief Complaint  Patient presents with  . Follow-up    HPI: Brittney Martinez is a 58 y.o. female here for follow-up. She was last seen in September. She has a history of internal hemorrhoids, possible occult fissure. She also has a history of GERD with previous Nissen fundoplication, intact but loose on recent EGD. At her last office visit we switched her from [place] to pantoprazole BID. She states she is still on Dexilant. Reports failure to Prilosec, Prevacid, AcipHex, pantoprazole also. Heartburn everyday. Right now tendency towards constipation. Currently not taking and out of left second, Bentyl, Reglan. Uses miralax every 2-3 days and causes bloating/gas. Complains of pain around the umbilicus which occurs daily, she thought it was related to her IBS. Really no persisting anorectal pain, occasional flare. Not always related to bowel movements. Rare rectal bleeding. May be interested hemorrhoid banding in the future.    Current Outpatient Prescriptions  Medication Sig Dispense Refill  . albuterol (PROVENTIL HFA;VENTOLIN HFA) 108 (90 BASE) MCG/ACT inhaler Inhale into the lungs every 6 (six) hours as needed for wheezing or shortness of breath.    Marland Kitchen aspirin 81 MG tablet Take 81 mg by mouth daily.    Marland Kitchen atorvastatin (LIPITOR) 10 MG tablet Take 10 mg by mouth daily.    . budesonide-formoterol (SYMBICORT) 160-4.5 MCG/ACT inhaler Inhale 2 puffs into the lungs 2 (two) times daily.    . clopidogrel (PLAVIX) 75 MG tablet Take 75 mg by mouth daily.      . cycloSPORINE (RESTASIS) 0.05 % ophthalmic emulsion Place 1 drop into both eyes 2 (two) times daily.      Marland Kitchen dexlansoprazole (DEXILANT) 60 MG capsule Take 60 mg by mouth daily.    Marland Kitchen dicyclomine (BENTYL) 10 MG capsule Take 10 mg by mouth 3 (three) times daily before meals.     Marland Kitchen doxepin (SINEQUAN) 75 MG capsule Take 75 mg by mouth at bedtime.      .  folic acid (FOLVITE) 381 MCG tablet Take 800 mcg by mouth daily.     Marland Kitchen glipiZIDE (GLUCOTROL XL) 5 MG 24 hr tablet Take 10 mg by mouth 2 (two) times daily.      . hyoscyamine (LEVSIN, ANASPAZ) 0.125 MG tablet Take 0.125 mg by mouth every 4 (four) hours as needed.    . insulin detemir (LEVEMIR) 100 UNIT/ML injection Inject 44 Units into the skin daily.    Marland Kitchen lisinopril (PRINIVIL,ZESTRIL) 2.5 MG tablet Take 2.5 mg by mouth daily.    . methotrexate (RHEUMATREX) 2.5 MG tablet Take by mouth once a week. Caution:Chemotherapy. Protect from light.patient takes on Thursday and Friday Takes 6 tablets on Thurs    . metoCLOPramide (REGLAN) 5 MG tablet Take 5 mg by mouth 3 (three) times daily before meals.    . metoprolol (TOPROL-XL) 100 MG 24 hr tablet Take 50 mg by mouth 2 (two) times daily.      . calcium carbonate (OS-CAL) 600 MG TABS Take 600 mg by mouth 2 (two) times daily with a meal.    . Nitroglycerin (RECTIV) 0.4 % OINT Place 1 inch rectally 2 (two) times daily. For 3 weeks. Use glove. 30 g 0  . polyethylene glycol powder (GLYCOLAX/MIRALAX) powder One capful daily as needed for constipation. 527 g 3   No current facility-administered medications for this visit.    Allergies as of 02/05/2014 - Review Complete 02/05/2014  Allergen  Reaction Noted  . Aspirin Nausea Only   . Propoxyphene n-acetaminophen Nausea And Vomiting   . Zithromax [azithromycin] Nausea Only 10/01/2010  . Erythromycin Rash 02/05/2014    ROS:  General: Negative for anorexia, weight loss, fever, chills, fatigue, weakness. ENT: Negative for hoarseness, difficulty swallowing , nasal congestion. CV: Negative for chest pain, angina, palpitations, dyspnea on exertion, peripheral edema.  Respiratory: Negative for dyspnea at rest, dyspnea on exertion, cough, sputum, wheezing.  GI: See history of present illness. GU:  Negative for dysuria, hematuria, urinary incontinence, urinary frequency, nocturnal urination.  Endo: Negative for  unusual weight change.    Physical Examination:   BP 141/74 mmHg  Pulse 59  Temp(Src) 97.2 F (36.2 C)  Wt 203 lb 12.8 oz (92.443 kg)  General: Well-nourished, well-developed in no acute distress.  Eyes: No icterus. Mouth: Oropharyngeal mucosa moist and pink , no lesions erythema or exudate. Lungs: Clear to auscultation bilaterally.  Heart: Regular rate and rhythm, no murmurs rubs or gallops.  Abdomen: Bowel sounds are normal, moderate tenderness above/left of umbilicus at scar, with fullness suggesting hernia, exam limited due to body habitus. No rebound or guarding.   Extremities: No lower extremity edema. No clubbing or deformities. Neuro: Alert and oriented x 4   Skin: Warm and dry, no jaundice.   Psych: Alert and cooperative, normal mood and affect.    CT A/P with contrast in 2012 showed fatty liver and large periumbilical hernia which contains fat only.

## 2014-02-05 NOTE — Assessment & Plan Note (Signed)
Fatty liver on previous imaging in 2012. Exam limited due to body habitus. Request last copy of LFTs/CBC from Dr. Woody Seller.

## 2014-02-05 NOTE — Assessment & Plan Note (Signed)
Referral to general surgery due to frequent discomfort.

## 2014-02-05 NOTE — Assessment & Plan Note (Signed)
Change to Nexium 40mg  daily. RX provided. Call if ongoing symptoms. Otherwise ov in 3 months.

## 2014-02-05 NOTE — Patient Instructions (Signed)
1. Stop metoclopramide (Reglan), Levsin, dicyclomine, Dexilant. 2. Start Nexium 40mg  daily before breakfast. RX sent to pharmacy. 3. Start Amitiza 10mcg twice daily with food for constipation/abdominal pain. RX sent to pharmacy and samples provided. 4. Call and schedule hemorrhoid banding with Dr. Gala Romney if you decide to pursue treatment of hemorrhoids.  5. Referral to Dr. Arnoldo Morale to discuss hernia repair. 6. Return to office in 3 months, call sooner if you are still having problems.

## 2014-02-05 NOTE — Assessment & Plan Note (Signed)
Currently constipated predominant. Stop bentyl, levsin. Has not been taking for awhile. Start Amitiza 25mcg BID. Samples and RX provided. Call with ongoing problems. Ov in 3 months.

## 2014-02-05 NOTE — Progress Notes (Signed)
Please request last CBC and LFTS from PCP.

## 2014-02-05 NOTE — Assessment & Plan Note (Signed)
Call in the future if you decide to proceed with hemorrhoid banding. You will need to request OV with Dr. Gala Romney specifically for hemorrhoid banding.

## 2014-02-08 NOTE — Progress Notes (Signed)
Requested labs from PCP 

## 2014-02-08 NOTE — Progress Notes (Signed)
cc'ed to pcp °

## 2014-03-13 NOTE — H&P (Addendum)
  NTS SOAP Note  Vital Signs:  Vitals as of: 05/09/4825: Systolic 078: Diastolic 80: Heart Rate 61: Temp 97.77F: Height 78ft 2.5in: Weight 204Lbs 0 Ounces: Pain Level 3: BMI 36.72  BMI : 36.72 kg/m2  Subjective: This 59 year old female presents for of a periumbilical hernia.  Has been present for some time.  Causes pain when straining.  Has been fixed once by another surgeon in the past.  Review of Symptoms:  Constitutional:unremarkable   Head:unremarkable Eyes:unremarkable   Nose/Mouth/Throat:unremarkable Cardiovascular:  unremarkable Respiratory:unremarkable Gastrointestinabdominal pain, heartburn Genitourinary:unremarkable   joint and neck pain dry Hematolgic/Lymphatic:unremarkable   Allergic/Immunologic:unremarkable   Past Medical History:  Reviewed  Past Medical History  Surgical History: lap nissen,  umbilical hernia repair Medical Problems: asthma,  iddm,  HTN,  high cholesterol,  IBS Allergies: ASA,  darvocet,  zithromax,  erythromycin Medications: proventil,  baby asa,  lipitor,  symbicort,  restasis,  plavix,  dexilant,  bentyl,  sinequan,  glucatrol,  levsin,  insulin,  lisinopril,  methotrexate,  reglan,  toprol,  oscal, rectiv    Social History:Reviewed  Social History  Preferred Language: English Race:  Black or African American Ethnicity: Not Hispanic / Latino Age: 79 year Marital Status:  W Alcohol: unknown   Smoking Status: Current every day smoker reviewed on 02/18/2014 Started Date:  Packs per week:  Functional Status reviewed on 02/18/2014 ------------------------------------------------ Bathing: Normal Cooking: Normal Dressing: Normal Driving: Normal Eating: Normal Managing Meds: Normal Oral Care: Normal Shopping: Normal Toileting: Normal Transferring: Normal Walking: Normal Cognitive Status reviewed on 02/18/2014 ------------------------------------------------ Attention: Normal Decision Making: Normal Language:  Normal Memory: Normal Motor: Normal Perception: Normal Problem Solving: Normal Visual and Spatial: Normal   Family History:Reviewed  Family Health History Family History is Unknown    Objective Information: General:Well appearing, well nourished in no distress. Heart:RRR, no murmur or gallop.  Normal S1, S2.  No S3, S4.  Lungs:  CTA bilaterally, no wheezes, rhonchi, rales.  Breathing unlabored. Abdomen:Soft, NT/ND, no HSM, no masses. Reducible umbilical hernia beneath a surgical scar.  Assessment:Incisional hernia  Diagnoses: 553.21  K43.2 Incisional hernia (Incisional hernia without obstruction or gangrene)  Procedures: 67544 - OFFICE OUTPATIENT NEW 30 MINUTES    Plan:  Will call to scheduled incisional herniorrhaphy with mesh.   Patient Education:Alternative treatments to surgery were discussed with patient (and family).  Risks and benefits  of procedure including bleeding,  infection,  mesh use,  and recurrence were fully explained to the patient (and family) who gave informed consent. Patient/family questions were addressed.  Follow-up:Pending Surgery  She has been instructed to stop her aspirin and Plavix 1 week prior to surgery.

## 2014-03-17 ENCOUNTER — Encounter (HOSPITAL_COMMUNITY)
Admission: RE | Admit: 2014-03-17 | Discharge: 2014-03-17 | Disposition: A | Discharge status: Home / Self Care | Payer: Medicare HMO | Source: Ambulatory Visit | Attending: General Surgery | Admitting: General Surgery

## 2014-03-17 ENCOUNTER — Encounter (HOSPITAL_COMMUNITY): Payer: Self-pay

## 2014-03-17 ENCOUNTER — Other Ambulatory Visit: Payer: Self-pay

## 2014-03-17 DIAGNOSIS — Z01818 Encounter for other preprocedural examination: Secondary | ICD-10-CM | POA: Diagnosis present

## 2014-03-17 DIAGNOSIS — K432 Incisional hernia without obstruction or gangrene: Secondary | ICD-10-CM | POA: Insufficient documentation

## 2014-03-17 HISTORY — DX: Other complications of anesthesia, initial encounter: T88.59XA

## 2014-03-17 HISTORY — DX: Unspecified glaucoma: H40.9

## 2014-03-17 HISTORY — DX: Other specified postprocedural states: Z98.890

## 2014-03-17 HISTORY — DX: Other specified postprocedural states: R11.2

## 2014-03-17 HISTORY — DX: Adverse effect of unspecified anesthetic, initial encounter: T41.45XA

## 2014-03-17 LAB — CBC WITH DIFFERENTIAL/PLATELET
Basophils Absolute: 0.1 10*3/uL (ref 0.0–0.1)
Basophils Relative: 1 % (ref 0–1)
Eosinophils Absolute: 0.1 10*3/uL (ref 0.0–0.7)
Eosinophils Relative: 2 % (ref 0–5)
HCT: 35.2 % — ABNORMAL LOW (ref 36.0–46.0)
Hemoglobin: 11.4 g/dL — ABNORMAL LOW (ref 12.0–15.0)
Lymphocytes Relative: 41 % (ref 12–46)
Lymphs Abs: 3.2 10*3/uL (ref 0.7–4.0)
MCH: 30.2 pg (ref 26.0–34.0)
MCHC: 32.4 g/dL (ref 30.0–36.0)
MCV: 93.1 fL (ref 78.0–100.0)
Monocytes Absolute: 0.5 10*3/uL (ref 0.1–1.0)
Monocytes Relative: 6 % (ref 3–12)
Neutro Abs: 4 10*3/uL (ref 1.7–7.7)
Neutrophils Relative %: 50 % (ref 43–77)
Platelets: 303 10*3/uL (ref 150–400)
RBC: 3.78 MIL/uL — ABNORMAL LOW (ref 3.87–5.11)
RDW: 15.4 % (ref 11.5–15.5)
WBC: 7.9 10*3/uL (ref 4.0–10.5)

## 2014-03-17 LAB — BASIC METABOLIC PANEL
Anion gap: 4 — ABNORMAL LOW (ref 5–15)
BUN: 11 mg/dL (ref 6–23)
CO2: 30 mmol/L (ref 19–32)
Calcium: 8.8 mg/dL (ref 8.4–10.5)
Chloride: 108 mEq/L (ref 96–112)
Creatinine, Ser: 0.77 mg/dL (ref 0.50–1.10)
GFR calc Af Amer: >90 mL/min (ref >90)
GFR calc non Af Amer: 89 mL/min — ABNORMAL LOW (ref >90)
Glucose, Bld: 76 mg/dL (ref 70–99)
Potassium: 3.7 mmol/L (ref 3.5–5.1)
Sodium: 142 mmol/L (ref 135–145)

## 2014-03-17 NOTE — Patient Instructions (Addendum)
Brittney Martinez  03/17/2014   Your procedure is scheduled on:  03/22/2014  Report to [facility] at 40  AM.  Call this number if you have problems the morning of surgery: [phone]   Remember:   Do not eat food or drink liquids after midnight.   Take these medicines the morning of surgery with A SIP OF WATER:  Nexium, metoprolol. Do not take any medcine for diabetes the morning of surgery. Take your inhalers before you come.   Do not wear jewelry, make-up or nail polish.  Do not wear lotions, powders, or perfumes.   Do not shave 48 hours prior to surgery. Men may shave face and neck.  Do not bring valuables to the hospital.  [facility] is not responsible for any belongings or valuables.               Contacts, dentures or bridgework may not be worn into surgery.  Leave suitcase in the car. After surgery it may be brought to your room.  For patients admitted to the hospital, discharge time is determined by your treatment team.               Patients discharged the day of surgery will not be allowed to drive home.  Name and phone number of your driver: family  Special Instructions: Shower using CHG 2 nights before surgery and the night before surgery.  If you shower the day of surgery use CHG.  Use special wash - you have one bottle of CHG for all showers.  You should use approximately 1/3 of the bottle for each shower.   Please read over the following fact sheets that you were given: Pain Booklet, Coughing and Deep Breathing, Surgical Site Infection Prevention, Anesthesia Post-op Instructions and Care and Recovery After Surgery Hernia A hernia occurs when an internal organ pushes out through a weak spot in the abdominal wall. Hernias most commonly occur in the groin and around the navel. Hernias often can be pushed back into place (reduced). Most hernias tend to get worse over time. Some abdominal hernias can get stuck in the opening (irreducible or incarcerated hernia) and cannot  be reduced. An irreducible abdominal hernia which is tightly squeezed into the opening is at risk for impaired blood supply (strangulated hernia). A strangulated hernia is a medical emergency. Because of the risk for an irreducible or strangulated hernia, surgery may be recommended to repair a hernia. CAUSES   Heavy lifting.  Prolonged coughing.  Straining to have a bowel movement.  A cut (incision) made during an abdominal surgery. HOME CARE INSTRUCTIONS   Bed rest is not required. You may continue your normal activities.  Avoid lifting more than 10 pounds (4.5 kg) or straining.  Cough gently. If you are a smoker it is best to stop. Even the best hernia repair can break down with the continual strain of coughing. Even if you do not have your hernia repaired, a cough will continue to aggravate the problem.  Do not wear anything tight over your hernia. Do not try to keep it in with an outside bandage or truss. These can damage abdominal contents if they are trapped within the hernia sac.  Eat a normal diet.  Avoid constipation. Straining over long periods of time will increase hernia size and encourage breakdown of repairs. If you cannot do this with diet alone, stool softeners may be used. SEEK IMMEDIATE MEDICAL CARE IF:   You have a fever.  You develop increasing abdominal pain.  You feel nauseous or vomit.  Your hernia is stuck outside the abdomen, looks discolored, feels hard, or is tender.  You have any changes in your bowel habits or in the hernia that are unusual for you.  You have increased pain or swelling around the hernia.  You cannot push the hernia back in place by applying gentle pressure while lying down. MAKE SURE YOU:   Understand these instructions.  Will watch your condition.  Will get help right away if you are not doing well or get worse. Document Released: 02/19/2005 Document Revised: 05/14/2011 Document Reviewed: 10/09/2007 [facility] Patient  Information 2015 [place], [place]. This information is not intended to replace advice given to you by your health care provider. Make sure you discuss any questions you have with your health care provider. PATIENT INSTRUCTIONS POST-ANESTHESIA  IMMEDIATELY FOLLOWING SURGERY:  Do not drive or operate machinery for the first twenty four hours after surgery.  Do not make any important decisions for twenty four hours after surgery or while taking narcotic pain medications or sedatives.  If you develop intractable nausea and vomiting or a severe headache please notify your doctor immediately.  FOLLOW-UP:  Please make an appointment with your surgeon as instructed. You do not need to follow up with anesthesia unless specifically instructed to do so.  WOUND CARE INSTRUCTIONS (if applicable):  Keep a dry clean dressing on the anesthesia/puncture wound site if there is drainage.  Once the wound has quit draining you may leave it open to air.  Generally you should leave the bandage intact for twenty four hours unless there is drainage.  If the epidural site drains for more than 36-48 hours please call the anesthesia department.  QUESTIONS?:  Please feel free to call your physician or the hospital operator if you have any questions, and they will be happy to assist you.

## 2014-03-17 NOTE — Pre-Procedure Instructions (Signed)
Patient given information to sign up for my chart at home. 

## 2014-03-22 ENCOUNTER — Encounter (HOSPITAL_COMMUNITY)
Admission: RE | Disposition: A | Discharge status: Home / Self Care | Payer: Self-pay | Source: Ambulatory Visit | Attending: General Surgery

## 2014-03-22 ENCOUNTER — Ambulatory Visit (HOSPITAL_COMMUNITY): Payer: Medicare HMO | Admitting: Anesthesiology

## 2014-03-22 ENCOUNTER — Encounter (HOSPITAL_COMMUNITY): Payer: Self-pay | Admitting: *Deleted

## 2014-03-22 ENCOUNTER — Observation Stay (HOSPITAL_COMMUNITY)
Admission: RE | Admit: 2014-03-22 | Discharge: 2014-03-22 | Disposition: A | Discharge status: Home / Self Care | Payer: Medicare HMO | Source: Ambulatory Visit | Attending: General Surgery | Admitting: General Surgery

## 2014-03-22 DIAGNOSIS — Z7902 Long term (current) use of antithrombotics/antiplatelets: Secondary | ICD-10-CM | POA: Diagnosis not present

## 2014-03-22 DIAGNOSIS — I10 Essential (primary) hypertension: Secondary | ICD-10-CM | POA: Insufficient documentation

## 2014-03-22 DIAGNOSIS — F17210 Nicotine dependence, cigarettes, uncomplicated: Secondary | ICD-10-CM | POA: Diagnosis not present

## 2014-03-22 DIAGNOSIS — E119 Type 2 diabetes mellitus without complications: Secondary | ICD-10-CM | POA: Diagnosis not present

## 2014-03-22 DIAGNOSIS — Z79899 Other long term (current) drug therapy: Secondary | ICD-10-CM | POA: Diagnosis not present

## 2014-03-22 DIAGNOSIS — Z881 Allergy status to other antibiotic agents status: Secondary | ICD-10-CM | POA: Insufficient documentation

## 2014-03-22 DIAGNOSIS — K432 Incisional hernia without obstruction or gangrene: Principal | ICD-10-CM | POA: Insufficient documentation

## 2014-03-22 DIAGNOSIS — Z794 Long term (current) use of insulin: Secondary | ICD-10-CM | POA: Insufficient documentation

## 2014-03-22 DIAGNOSIS — Z885 Allergy status to narcotic agent status: Secondary | ICD-10-CM | POA: Diagnosis not present

## 2014-03-22 DIAGNOSIS — E780 Pure hypercholesterolemia: Secondary | ICD-10-CM | POA: Diagnosis not present

## 2014-03-22 DIAGNOSIS — Z886 Allergy status to analgesic agent status: Secondary | ICD-10-CM | POA: Insufficient documentation

## 2014-03-22 DIAGNOSIS — J45909 Unspecified asthma, uncomplicated: Secondary | ICD-10-CM | POA: Insufficient documentation

## 2014-03-22 HISTORY — PX: INSERTION OF MESH: SHX5868

## 2014-03-22 HISTORY — PX: INCISIONAL HERNIA REPAIR: SHX193

## 2014-03-22 LAB — GLUCOSE, CAPILLARY
Glucose-Capillary: 124 mg/dL — ABNORMAL HIGH (ref 70–99)
Glucose-Capillary: 124 mg/dL — ABNORMAL HIGH (ref 70–99)

## 2014-03-22 SURGERY — REPAIR, HERNIA, INCISIONAL
Anesthesia: General | Site: Abdomen

## 2014-03-22 MED ORDER — ONDANSETRON HCL 4 MG/2ML IJ SOLN: 4.0000 mg | Freq: Four times a day (QID) | INTRAMUSCULAR | Status: DC | PRN

## 2014-03-22 MED ORDER — SUCCINYLCHOLINE CHLORIDE 20 MG/ML IJ SOLN
INTRAMUSCULAR | Status: DC | PRN
  Administered 2014-03-22: 140 mg via INTRAVENOUS

## 2014-03-22 MED ORDER — LORAZEPAM 2 MG/ML IJ SOLN: 1.0000 mg | Freq: Four times a day (QID) | INTRAMUSCULAR | Status: DC | PRN

## 2014-03-22 MED ORDER — GLYCOPYRROLATE 0.2 MG/ML IJ SOLN
INTRAMUSCULAR | Status: AC
  Filled 2014-03-22: qty 3

## 2014-03-22 MED ORDER — BUPIVACAINE LIPOSOME 1.3 % IJ SUSP
INTRAMUSCULAR | Status: AC
  Filled 2014-03-22: qty 20

## 2014-03-22 MED ORDER — MIDAZOLAM HCL 2 MG/2ML IJ SOLN
INTRAMUSCULAR | Status: AC
  Filled 2014-03-22: qty 2

## 2014-03-22 MED ORDER — INSULIN ASPART 100 UNIT/ML ~~LOC~~ SOLN: 0.0000 [IU] | Freq: Three times a day (TID) | SUBCUTANEOUS | Status: DC

## 2014-03-22 MED ORDER — ROCURONIUM BROMIDE 100 MG/10ML IV SOLN
INTRAVENOUS | Status: DC | PRN
  Administered 2014-03-22: 5 mg via INTRAVENOUS
  Administered 2014-03-22: 20 mg via INTRAVENOUS

## 2014-03-22 MED ORDER — OXYCODONE-ACETAMINOPHEN 7.5-325 MG PO TABS: 1.0000 | ORAL_TABLET | Freq: Four times a day (QID) | ORAL | Status: DC | PRN

## 2014-03-22 MED ORDER — BUDESONIDE-FORMOTEROL FUMARATE 160-4.5 MCG/ACT IN AERO: 2.0000 | INHALATION_SPRAY | Freq: Two times a day (BID) | RESPIRATORY_TRACT | Status: DC

## 2014-03-22 MED ORDER — PROPOFOL 10 MG/ML IV BOLUS
INTRAVENOUS | Status: AC
  Filled 2014-03-22: qty 20

## 2014-03-22 MED ORDER — NEOSTIGMINE METHYLSULFATE 10 MG/10ML IV SOLN
INTRAVENOUS | Status: DC | PRN
  Administered 2014-03-22: 1 mg via INTRAVENOUS
  Administered 2014-03-22: 3 mg via INTRAVENOUS

## 2014-03-22 MED ORDER — ONDANSETRON HCL 4 MG PO TABS: 4.0000 mg | ORAL_TABLET | Freq: Four times a day (QID) | ORAL | Status: DC | PRN

## 2014-03-22 MED ORDER — VANCOMYCIN HCL IN DEXTROSE 1-5 GM/200ML-% IV SOLN
1000.0000 mg | INTRAVENOUS | Status: AC
  Administered 2014-03-22: 1000 mg via INTRAVENOUS
  Filled 2014-03-22: qty 200

## 2014-03-22 MED ORDER — BUPIVACAINE HCL (PF) 0.5 % IJ SOLN
INTRAMUSCULAR | Status: AC
  Filled 2014-03-22: qty 30

## 2014-03-22 MED ORDER — ARTIFICIAL TEARS OP OINT
TOPICAL_OINTMENT | OPHTHALMIC | Status: AC
  Filled 2014-03-22: qty 3.5

## 2014-03-22 MED ORDER — LUBIPROSTONE 8 MCG PO CAPS: 8.0000 ug | ORAL_CAPSULE | Freq: Two times a day (BID) | ORAL | Status: DC

## 2014-03-22 MED ORDER — DICYCLOMINE HCL 10 MG PO CAPS: 10.0000 mg | ORAL_CAPSULE | Freq: Three times a day (TID) | ORAL | Status: DC

## 2014-03-22 MED ORDER — LIDOCAINE HCL (CARDIAC) 10 MG/ML IV SOLN
INTRAVENOUS | Status: DC | PRN
  Administered 2014-03-22: 20 mg via INTRAVENOUS

## 2014-03-22 MED ORDER — LACTATED RINGERS IV SOLN
INTRAVENOUS | Status: DC
  Administered 2014-03-22: 07:00:00 via INTRAVENOUS

## 2014-03-22 MED ORDER — ATORVASTATIN CALCIUM 10 MG PO TABS: 10.0000 mg | ORAL_TABLET | Freq: Every day | ORAL | Status: DC

## 2014-03-22 MED ORDER — FENTANYL CITRATE 0.05 MG/ML IJ SOLN
INTRAMUSCULAR | Status: AC
  Filled 2014-03-22: qty 2

## 2014-03-22 MED ORDER — PROPOFOL 10 MG/ML IV BOLUS
INTRAVENOUS | Status: DC | PRN
  Administered 2014-03-22: 140 mg via INTRAVENOUS

## 2014-03-22 MED ORDER — CHLORHEXIDINE GLUCONATE 4 % EX LIQD: 1.0000 "application " | Freq: Once | CUTANEOUS | Status: DC

## 2014-03-22 MED ORDER — ENOXAPARIN SODIUM 40 MG/0.4ML ~~LOC~~ SOLN: 40.0000 mg | SUBCUTANEOUS | Status: DC

## 2014-03-22 MED ORDER — BUPIVACAINE HCL (PF) 0.5 % IJ SOLN
INTRAMUSCULAR | Status: DC | PRN
Start: 2014-03-22 — End: 2014-03-22
  Administered 2014-03-22: 10 mL

## 2014-03-22 MED ORDER — KETOROLAC TROMETHAMINE 30 MG/ML IJ SOLN
INTRAMUSCULAR | Status: AC
  Filled 2014-03-22: qty 1

## 2014-03-22 MED ORDER — DOXEPIN HCL 75 MG PO CAPS: 75.0000 mg | ORAL_CAPSULE | Freq: Every day | ORAL | Status: DC

## 2014-03-22 MED ORDER — GLIPIZIDE ER 10 MG PO TB24: 10.0000 mg | ORAL_TABLET | Freq: Two times a day (BID) | ORAL | Status: DC

## 2014-03-22 MED ORDER — SODIUM CHLORIDE 0.9 % IR SOLN
Status: DC | PRN
  Administered 2014-03-22: 1000 mL

## 2014-03-22 MED ORDER — LISINOPRIL 2.5 MG PO TABS: 2.5000 mg | ORAL_TABLET | Freq: Every day | ORAL | Status: DC

## 2014-03-22 MED ORDER — LIDOCAINE HCL (PF) 1 % IJ SOLN
INTRAMUSCULAR | Status: AC
  Filled 2014-03-22: qty 5

## 2014-03-22 MED ORDER — SUCCINYLCHOLINE CHLORIDE 20 MG/ML IJ SOLN
INTRAMUSCULAR | Status: AC
  Filled 2014-03-22: qty 1

## 2014-03-22 MED ORDER — HYDROMORPHONE HCL 1 MG/ML IJ SOLN: 1.0000 mg | INTRAMUSCULAR | Status: DC | PRN

## 2014-03-22 MED ORDER — PANTOPRAZOLE SODIUM 40 MG PO TBEC: 40.0000 mg | DELAYED_RELEASE_TABLET | Freq: Every day | ORAL | Status: DC

## 2014-03-22 MED ORDER — INSULIN DETEMIR 100 UNIT/ML ~~LOC~~ SOLN: 44.0000 [IU] | Freq: Every day | SUBCUTANEOUS | Status: DC

## 2014-03-22 MED ORDER — ONDANSETRON HCL 4 MG/2ML IJ SOLN
4.0000 mg | Freq: Once | INTRAMUSCULAR | Status: AC
  Administered 2014-03-22: 4 mg via INTRAVENOUS

## 2014-03-22 MED ORDER — GLYCOPYRROLATE 0.2 MG/ML IJ SOLN
INTRAMUSCULAR | Status: DC | PRN
  Administered 2014-03-22: 0.6 mg via INTRAVENOUS
  Administered 2014-03-22: 0.2 mg via INTRAVENOUS

## 2014-03-22 MED ORDER — ALBUTEROL SULFATE HFA 108 (90 BASE) MCG/ACT IN AERS
INHALATION_SPRAY | RESPIRATORY_TRACT | Status: DC | PRN
  Administered 2014-03-22: 3 via RESPIRATORY_TRACT

## 2014-03-22 MED ORDER — HYDROCODONE-ACETAMINOPHEN 5-325 MG PO TABS: 1.0000 | ORAL_TABLET | ORAL | Status: DC | PRN

## 2014-03-22 MED ORDER — ROCURONIUM BROMIDE 50 MG/5ML IV SOLN
INTRAVENOUS | Status: AC
Start: 2014-03-22 — End: 2014-03-22
  Filled 2014-03-22: qty 1

## 2014-03-22 MED ORDER — KETOROLAC TROMETHAMINE 30 MG/ML IJ SOLN
30.0000 mg | Freq: Once | INTRAMUSCULAR | Status: AC
  Administered 2014-03-22: 30 mg via INTRAVENOUS

## 2014-03-22 MED ORDER — MIDAZOLAM HCL 2 MG/2ML IJ SOLN
1.0000 mg | INTRAMUSCULAR | Status: DC | PRN
  Administered 2014-03-22: 2 mg via INTRAVENOUS

## 2014-03-22 MED ORDER — LACTATED RINGERS IV SOLN: INTRAVENOUS | Status: DC

## 2014-03-22 MED ORDER — POVIDONE-IODINE 10 % EX OINT
TOPICAL_OINTMENT | CUTANEOUS | Status: AC
  Filled 2014-03-22: qty 1

## 2014-03-22 MED ORDER — METOPROLOL SUCCINATE ER 50 MG PO TB24: 50.0000 mg | ORAL_TABLET | Freq: Two times a day (BID) | ORAL | Status: DC

## 2014-03-22 MED ORDER — FENTANYL CITRATE 0.05 MG/ML IJ SOLN
INTRAMUSCULAR | Status: DC | PRN
  Administered 2014-03-22: 50 ug via INTRAVENOUS
  Administered 2014-03-22 (×2): 25 ug via INTRAVENOUS

## 2014-03-22 MED ORDER — POVIDONE-IODINE 10 % OINT PACKET
TOPICAL_OINTMENT | CUTANEOUS | Status: DC | PRN
  Administered 2014-03-22: 1 via TOPICAL

## 2014-03-22 MED ORDER — ALBUTEROL SULFATE HFA 108 (90 BASE) MCG/ACT IN AERS: 1.0000 | INHALATION_SPRAY | RESPIRATORY_TRACT | Status: DC | PRN

## 2014-03-22 MED ORDER — ONDANSETRON HCL 4 MG/2ML IJ SOLN
INTRAMUSCULAR | Status: AC
  Filled 2014-03-22: qty 2

## 2014-03-22 SURGICAL SUPPLY — 47 items
BAG HAMPER (MISCELLANEOUS) ×2 IMPLANT
CHLORAPREP W/TINT 26ML (MISCELLANEOUS) ×2 IMPLANT
CLOTH BEACON ORANGE TIMEOUT ST (SAFETY) ×2 IMPLANT
COVER LIGHT HANDLE STERIS (MISCELLANEOUS) ×4 IMPLANT
DECANTER SPIKE VIAL GLASS SM (MISCELLANEOUS) ×1 IMPLANT
ELECT REM PT RETURN 9FT ADLT (ELECTROSURGICAL) ×2
ELECTRODE REM PT RTRN 9FT ADLT (ELECTROSURGICAL) ×1 IMPLANT
FORMALIN 10 PREFIL 480ML (MISCELLANEOUS) ×1 IMPLANT
GAUZE SPONGE 4X4 12PLY STRL (GAUZE/BANDAGES/DRESSINGS) ×1 IMPLANT
GLOVE BIOGEL M 7.0 STRL (GLOVE) ×1 IMPLANT
GLOVE BIOGEL PI IND STRL 7.0 (GLOVE) IMPLANT
GLOVE BIOGEL PI INDICATOR 7.0 (GLOVE) ×2
GLOVE EXAM NITRILE MD LF STRL (GLOVE) ×1 IMPLANT
GLOVE SS BIOGEL STRL SZ 6.5 (GLOVE) IMPLANT
GLOVE SUPERSENSE BIOGEL SZ 6.5 (GLOVE) ×1
GLOVE SURG SS PI 7.5 STRL IVOR (GLOVE) ×2 IMPLANT
GOWN STRL REUS W/ TWL XL LVL3 (GOWN DISPOSABLE) ×1 IMPLANT
GOWN STRL REUS W/TWL LRG LVL3 (GOWN DISPOSABLE) ×4 IMPLANT
GOWN STRL REUS W/TWL XL LVL3 (GOWN DISPOSABLE) ×2
INST SET MAJOR GENERAL (KITS) ×2 IMPLANT
KIT ROOM TURNOVER APOR (KITS) ×2 IMPLANT
MANIFOLD NEPTUNE II (INSTRUMENTS) ×2 IMPLANT
MESH VENTRALEX ST 8CM LRG (Mesh General) ×1 IMPLANT
NDL HYPO 25X1 1.5 SAFETY (NEEDLE) ×1 IMPLANT
NEEDLE HYPO 25X1 1.5 SAFETY (NEEDLE) ×2 IMPLANT
NS IRRIG 1000ML POUR BTL (IV SOLUTION) ×2 IMPLANT
PACK ABDOMINAL MAJOR (CUSTOM PROCEDURE TRAY) ×2 IMPLANT
PAD ARMBOARD 7.5X6 YLW CONV (MISCELLANEOUS) ×2 IMPLANT
SET BASIN LINEN APH (SET/KITS/TRAYS/PACK) ×2 IMPLANT
SPONGE GAUZE 4X4 12PLY (GAUZE/BANDAGES/DRESSINGS) ×1 IMPLANT
STAPLER VISISTAT (STAPLE) ×1 IMPLANT
SUT ETHIBOND NAB MO 7 #0 18IN (SUTURE) ×2 IMPLANT
SUT NOVA NAB GS-21 1 T12 (SUTURE) IMPLANT
SUT NOVA NAB GS-22 2 2-0 T-19 (SUTURE) IMPLANT
SUT NOVA NAB GS-26 0 60 (SUTURE) IMPLANT
SUT PROLENE 0 CT 1 CR/8 (SUTURE) IMPLANT
SUT SILK 2 0 (SUTURE)
SUT SILK 2-0 18XBRD TIE 12 (SUTURE) IMPLANT
SUT VIC AB 2-0 CT1 27 (SUTURE) ×2
SUT VIC AB 2-0 CT1 TAPERPNT 27 (SUTURE) ×1 IMPLANT
SUT VIC AB 3-0 SH 27 (SUTURE) ×2
SUT VIC AB 3-0 SH 27X BRD (SUTURE) ×1 IMPLANT
SUT VIC AB 4-0 PS2 27 (SUTURE) IMPLANT
SUT VICRYL AB 2 0 TIES (SUTURE) ×1 IMPLANT
SYR 20CC LL (SYRINGE) ×2 IMPLANT
SYR CONTROL 10ML LL (SYRINGE) ×1 IMPLANT
TAPE CLOTH SURG 4X10 WHT LF (GAUZE/BANDAGES/DRESSINGS) ×1 IMPLANT

## 2014-03-22 NOTE — Anesthesia Preprocedure Evaluation (Signed)
Anesthesia Evaluation  Patient identified by MRN, date of birth, ID band Patient awake    Reviewed: Allergy & Precautions, NPO status , Patient's Chart, lab work & pertinent test results, reviewed documented beta blocker date and time   History of Anesthesia Complications (+) PONV and history of anesthetic complications  Airway Mallampati: IV  TM Distance: >3 FB     Dental  (+) Edentulous Upper, Edentulous Lower   Pulmonary asthma , sleep apnea , former smoker,  breath sounds clear to auscultation        Cardiovascular hypertension, Pt. on medications and Pt. on home beta blockers + Peripheral Vascular Disease Rhythm:Regular Rate:Normal     Neuro/Psych PSYCHIATRIC DISORDERS Anxiety Depression TIA   GI/Hepatic GERD-  Medicated and Controlled,  Endo/Other  diabetes, Type 2, Oral Hypoglycemic Agents, Insulin Dependent  Renal/GU      Musculoskeletal  (+) Arthritis -,   Abdominal   Peds  Hematology   Anesthesia Other Findings   Reproductive/Obstetrics                             Anesthesia Physical Anesthesia Plan  ASA: III  Anesthesia Plan: General   Post-op Pain Management:    Induction: Intravenous, Rapid sequence and Cricoid pressure planned  Airway Management Planned: Oral ETT and Video Laryngoscope Planned  Additional Equipment:   Intra-op Plan:   Post-operative Plan: Extubation in OR  Informed Consent: I have reviewed the patients History and Physical, chart, labs and discussed the procedure including the risks, benefits and alternatives for the proposed anesthesia with the patient or authorized representative who has indicated his/her understanding and acceptance.     Plan Discussed with:   Anesthesia Plan Comments:         Anesthesia Quick Evaluation

## 2014-03-22 NOTE — Addendum Note (Signed)
Addendum  created 03/22/14 0955 by Vista Deck, CRNA   Modules edited: Notes Section   Notes Section:  File: [id number]

## 2014-03-22 NOTE — Transfer of Care (Signed)
Immediate Anesthesia Transfer of Care Note  Patient: Brittney Martinez  Procedure(s) Performed: Procedure(s): INCISIONAL HERNIORRHAPHY (N/A) INSERTION OF MESH (N/A)  Patient Location: PACU  Anesthesia Type:General  Level of Consciousness: sedated and patient cooperative  Airway & Oxygen Therapy: Patient Spontanous Breathing and non-rebreather face mask  Post-op Assessment: Report given to PACU RN, Post -op Vital signs reviewed and stable and Patient moving all extremities  Post vital signs: Reviewed and stable  Complications: No apparent anesthesia complications

## 2014-03-22 NOTE — Interval H&P Note (Signed)
History and Physical Interval Note:  03/22/2014 7:30 AM  Brittney Martinez  has presented today for surgery, with the diagnosis of incisional hernia  The various methods of treatment have been discussed with the patient and family. After consideration of risks, benefits and other options for treatment, the patient has consented to  Procedure(s): HERNIA REPAIR INCISIONAL WITH MESH (N/A) as a surgical intervention .  The patient's history has been reviewed, patient examined, no change in status, stable for surgery.  I have reviewed the patient's chart and labs.  Questions were answered to the patient's satisfaction.     Aviva Signs A

## 2014-03-22 NOTE — Anesthesia Postprocedure Evaluation (Signed)
  Anesthesia Post-op Note  Patient: Brittney Martinez  Procedure(s) Performed: Procedure(s): INCISIONAL HERNIORRHAPHY (N/A) INSERTION OF MESH (N/A)  Patient Location: PACU  Anesthesia Type:General  Level of Consciousness: awake and patient cooperative  Airway and Oxygen Therapy: Patient Spontanous Breathing and non-rebreather face mask  Post-op Pain: mild  Post-op Assessment: Post-op Vital signs reviewed, Patient's Cardiovascular Status Stable, Respiratory Function Stable and Patent Airway  Post-op Vital Signs: Reviewed and stable  Complications: No apparent anesthesia complications

## 2014-03-22 NOTE — Anesthesia Postprocedure Evaluation (Signed)
  Anesthesia Post-op Note  Patient: Brittney Martinez  Procedure(s) Performed: Procedure(s): INCISIONAL HERNIORRHAPHY (N/A) INSERTION OF MESH (N/A)  Patient Location: PACU  Anesthesia Type:General  Level of Consciousness: awake, alert  and patient cooperative  Airway and Oxygen Therapy: Patient Spontanous Breathing and Patient connected to nasal cannula oxygen  Post-op Pain: none  Post-op Assessment: Post-op Vital signs reviewed, Patient's Cardiovascular Status Stable, Respiratory Function Stable, Patent Airway, No signs of Nausea or vomiting and Adequate PO intake  Post-op Vital Signs: Reviewed and stable  Complications: No apparent anesthesia complications

## 2014-03-22 NOTE — Anesthesia Procedure Notes (Addendum)
Procedure Name: Intubation Performed by: Vista Deck Pre-anesthesia Checklist: Patient identified, Patient being monitored, Timeout performed, Emergency Drugs available and Suction available Patient Re-evaluated:Patient Re-evaluated prior to inductionOxygen Delivery Method: Circle System Utilized Preoxygenation: Pre-oxygenation with 100% oxygen Intubation Type: IV induction, Rapid sequence and Cricoid Pressure applied Laryngoscope Size: Glidescope and 3 Grade View: Grade I Tube type: Oral Tube size: 7.0 mm Number of attempts: 1 Airway Equipment and Method: Stylet,  Oral airway and Video-laryngoscopy Placement Confirmation: ETT inserted through vocal cords under direct vision,  positive ETCO2 and breath sounds checked- equal and bilateral Secured at: 22 cm Tube secured with: Tape Dental Injury: Teeth and Oropharynx as per pre-operative assessment

## 2014-03-22 NOTE — Discharge Instructions (Signed)
Open Hernia Repair, Care After °Refer to this sheet in the next few weeks. These instructions provide you with information on caring for yourself after your procedure. Your health care provider may also give you more specific instructions. Your treatment has been planned according to current medical practices, but problems sometimes occur. Call your health care provider if you have any problems or questions after your procedure. °WHAT TO EXPECT AFTER THE PROCEDURE °After your procedure, it is typical to have the following: °· Pain in your abdomen, especially along your incision. You will be given pain medicines to control the pain. °· Constipation. You may be given a stool softener to help prevent this. °HOME CARE INSTRUCTIONS  °· Only take over-the-counter or prescription medicines as directed by your health care provider. °· Keep the wound dry and clean. You may wash the wound gently with soap and water 48 hours after surgery. Gently blot or dab the wound dry. Do not take baths, use swimming pools, or use hot tubs for 10 days or until your health care provider approves. °· Change bandages (dressings) as directed by your health care provider. °· Continue your normal diet as directed by your health care provider. Eat plenty of fruits and vegetables to help prevent constipation. °· Drink enough fluids to keep your urine clear or pale yellow. This also helps prevent constipation. °· Do not drive until your health care provider says it is okay. °· Do not lift anything heavier than 10 pounds (4.5 kg) or play contact sports for 4 weeks or until your health care provider approves. °· Follow up with your health care provider as directed. Ask your health care provider when to make an appointment to have your stitches (sutures) or staples removed. °SEEK MEDICAL CARE IF:  °· You have increased bleeding coming from the incision site. °· You have blood in your stool. °· You have increasing pain in the wound. °· You see redness  or swelling in the wound. °· You have fluid (pus) coming from the wound. °· You have a fever. °· You notice a bad smell coming from the wound or dressing. °SEEK IMMEDIATE MEDICAL CARE IF:  °· You develop a rash. °· You have chest pain or shortness of breath. °· You feel lightheaded or feel faint. °Document Released: 09/08/2004 Document Revised: 12/10/2012 Document Reviewed: 10/01/2012 °ExitCare® Patient Information ©2015 [employer]. This information is not intended to replace advice given to you by your health care provider. Make sure you discuss any questions you have with your health care provider. ° °

## 2014-03-22 NOTE — Op Note (Signed)
Patient:  ULA COUVILLON  DOB:  [birth year]  MRN:  [id number]   Preop Diagnosis:  Incisional hernia  Postop Diagnosis:  Same  Procedure:  Incisional herniorrhaphy with mesh  Surgeon:  Aviva Signs, M.D.  Anes:  Gen. endotracheal  Indications:  Patient is a 59 year old black female who has had multiple abdominal surgeries in the past including incisional hernia repair at another facility who presents with a recurrence of her incisional hernia. The risks and benefits of the procedure including bleeding, infection, and recurrence the hernia were fully explained to the patient, who gave informed consent.  Procedure note:  The patient was placed in the supine position. After induction of general endotracheal anesthesia, the abdomen was prepped and draped using usual sterile technique with ChloraPrep. Surgical site confirmation was performed.  An infraumbilical incision was made down to the fascia. There were multiple small hernia defects at the base the umbilicus. Old mesh was found. This was excised and disposed of. The ultimate defect was 2-3 cm in its greatest diameter. Any omental adhesions to the abdominal wall were freed away using Bovie electrocautery. A bleeding was controlled using 2-0 Vicryl ties. An 8 cm ventralex ST patch was then inserted and secured to the fascia using 0 Ethibond interrupted sutures. The overlying fascia was reapproximated transversely using 0 Ethibond interrupted sutures. The subcutaneous layer was reapproximated using 3-0 Vicryl interrupted sutures. 0.5% Sensorcaine was instilled the surrounding wound. The incision was closed using staples. Betadine ointment and dry sterile dressings were applied.  All tape and needle counts were correct at the end of the procedure. Patient was extubated in the operating room and transferred to PACU in stable condition.  Complications:  None  EBL:  Less than 50 mL  Specimen:  None

## 2014-03-23 ENCOUNTER — Encounter (HOSPITAL_COMMUNITY): Payer: Self-pay | Admitting: General Surgery

## 2014-04-01 NOTE — Progress Notes (Signed)
Received copy of labs from PCP dated October 2014. CBC was normal. LFTs normal.  Given history of fatty liver, she needs to have LFTs done yearly. Please request she have LFTs done at this time.

## 2014-04-01 NOTE — Addendum Note (Signed)
Addended by: Mahala Menghini on: 04/01/2014 02:03 PM   Modules accepted: Orders

## 2014-04-02 NOTE — Progress Notes (Signed)
Letter and lab order mailed to the pt.

## 2014-05-12 ENCOUNTER — Ambulatory Visit (INDEPENDENT_AMBULATORY_CARE_PROVIDER_SITE_OTHER): Payer: Medicare HMO | Admitting: Gastroenterology

## 2014-05-12 ENCOUNTER — Encounter: Payer: Self-pay | Admitting: Gastroenterology

## 2014-05-12 VITALS — BP 118/74 | HR 86 | Temp 97.3°F | Ht 62.0 in | Wt 207.2 lb

## 2014-05-12 DIAGNOSIS — K760 Fatty (change of) liver, not elsewhere classified: Secondary | ICD-10-CM

## 2014-05-12 DIAGNOSIS — K589 Irritable bowel syndrome without diarrhea: Secondary | ICD-10-CM

## 2014-05-12 DIAGNOSIS — K219 Gastro-esophageal reflux disease without esophagitis: Secondary | ICD-10-CM

## 2014-05-12 MED ORDER — ESOMEPRAZOLE MAGNESIUM 40 MG PO CPDR: 40.0000 mg | DELAYED_RELEASE_CAPSULE | Freq: Two times a day (BID) | ORAL | Status: DC

## 2014-05-12 NOTE — Assessment & Plan Note (Signed)
Poorly controlled. She wonders if surgery is an option for her. According to the patient, Dr. Arnoldo Morale recommended tertiary referral to [facility]. Request records. Increase nexium to BID before meals. Will discuss further with Dr. Gala Romney.

## 2014-05-12 NOTE — Patient Instructions (Addendum)
1. Increase Nexium to 40mg  before breakfast and before evening meal. New RX sent to your pharmacy. Do not take with at the same time of your Plavix. 2. Please have your liver blood work done as previously requested to follow up on your fatty liver. 3. I will discuss whether or not another reflux surgery with Dr. Gala Romney. Further recommendations to follow.  4. Return to office in six months.

## 2014-05-12 NOTE — Assessment & Plan Note (Signed)
Overdue for LFTs. Return to office in six months.

## 2014-05-12 NOTE — Assessment & Plan Note (Signed)
Stable

## 2014-05-12 NOTE — Progress Notes (Signed)
Primary Care Physician: Glenda Chroman., MD  Primary Gastroenterologist:  Garfield Cornea, MD   Chief Complaint  Patient presents with  . Irritable Bowel Syndrome  . Gastrophageal Reflux    HPI: Brittney Martinez is a 59 y.o. female here for follow-up. She was last seen in December 2015. She has a history of GERD with previous Nissen fundoplication, intact but loose on recent EGD. History of IBS, fatty liver, hemorrhoids and has contemplated hemorrhoid banding. Underwent umbilical hernia repair since we last saw her. Doing well post-operatively. Uses bentyl or levsin for cramps when needed. Use amitiza when needed. IBS symptoms quiescent now. States that she discussed poorly controlled reflux with Dr. Arnoldo Morale who suggested she consider redo Nissen fundoplication at [facility] done approximately 13 years ago by Dr. Anthony Sar.   She has failed all PPIs except Zegerid. Has breakthrough heartburn any time during the day. Nocturnal as well. No vomiting or abd pain. No dysphagia. Following antireflux measures. Weight is stable. Has not had LFTs since 2014 for fatty liver.     Current Outpatient Prescriptions  Medication Sig Dispense Refill  . albuterol (PROVENTIL HFA;VENTOLIN HFA) 108 (90 BASE) MCG/ACT inhaler Inhale into the lungs every 6 (six) hours as needed for wheezing or shortness of breath.    Marland Kitchen aspirin 81 MG tablet Take 81 mg by mouth daily.    Marland Kitchen atorvastatin (LIPITOR) 10 MG tablet Take 10 mg by mouth daily.    . budesonide-formoterol (SYMBICORT) 160-4.5 MCG/ACT inhaler Inhale 2 puffs into the lungs 2 (two) times daily.    . calcium carbonate (OS-CAL) 600 MG TABS Take 600 mg by mouth 2 (two) times daily with a meal.    . clopidogrel (PLAVIX) 75 MG tablet Take 75 mg by mouth daily.      . cycloSPORINE (RESTASIS) 0.05 % ophthalmic emulsion Place 1 drop into both eyes 2 (two) times daily.      Marland Kitchen dicyclomine (BENTYL) 10 MG capsule Take 10 mg by mouth 4 (four) times daily -   before meals and at bedtime.    Marland Kitchen doxepin (SINEQUAN) 75 MG capsule Take 75 mg by mouth at bedtime.      Marland Kitchen esomeprazole (NEXIUM) 40 MG capsule Take 1 capsule (40 mg total) by mouth daily before breakfast. 30 capsule 11  . folic acid (FOLVITE) 528 MCG tablet Take 800 mcg by mouth daily.     Marland Kitchen glipiZIDE (GLUCOTROL XL) 5 MG 24 hr tablet Take 10 mg by mouth 2 (two) times daily.      . insulin detemir (LEVEMIR) 100 UNIT/ML injection Inject 44 Units into the skin daily.    Marland Kitchen lisinopril (PRINIVIL,ZESTRIL) 2.5 MG tablet Take 2.5 mg by mouth daily.    Marland Kitchen lubiprostone (AMITIZA) 8 MCG capsule Take 1 capsule (8 mcg total) by mouth 2 (two) times daily with a meal. 60 capsule 11  . methotrexate (RHEUMATREX) 2.5 MG tablet Take 7.5 mg by mouth 2 (two) times a week. Caution:Chemotherapy. Protect from light.patient takes on Thursday and Friday Takes 3 tablets on Thursdays and 3 tablets on Fridays    . metoprolol (TOPROL-XL) 100 MG 24 hr tablet Take 50 mg by mouth 2 (two) times daily.       No current facility-administered medications for this visit.    Allergies as of 05/12/2014 - Review Complete 05/12/2014  Allergen Reaction Noted  . Aspirin Nausea Only   . Propoxyphene n-acetaminophen Nausea And Vomiting   . Zithromax [azithromycin] Nausea Only 10/01/2010  .  Erythromycin Rash 02/05/2014    ROS:  General: Negative for anorexia, weight loss, fever, chills, fatigue, weakness. ENT: Negative for hoarseness, difficulty swallowing , nasal congestion. CV: Negative for chest pain, angina, palpitations, dyspnea on exertion, peripheral edema.  Respiratory: Negative for dyspnea at rest, dyspnea on exertion, cough, sputum, wheezing.  GI: See history of present illness. GU:  Negative for dysuria, hematuria, urinary incontinence, urinary frequency, nocturnal urination.  Endo: Negative for unusual weight change.    Physical Examination:   BP 118/74 mmHg  Pulse 86  Temp(Src) 97.3 F (36.3 C) (Oral)  Ht 5\' 2"   (1.575 m)  Wt 207 lb 3.2 oz (93.985 kg)  BMI 37.89 kg/m2  General: Well-nourished, well-developed in no acute distress.  Eyes: No icterus. Mouth: Oropharyngeal mucosa moist and pink , no lesions erythema or exudate. Lungs: Clear to auscultation bilaterally.  Heart: Regular rate and rhythm, no murmurs rubs or gallops.  Abdomen: Bowel sounds are normal, nontender, nondistended, no hepatosplenomegaly or masses, no abdominal bruits or hernia , no rebound or guarding.   Extremities: No lower extremity edema. No clubbing or deformities. Neuro: Alert and oriented x 4   Skin: Warm and dry, no jaundice.   Psych: Alert and cooperative, normal mood and affect.

## 2014-05-13 NOTE — Progress Notes (Signed)
cc'ed to pcp °

## 2014-06-15 NOTE — Progress Notes (Addendum)
Labs from April 7 with LFTs normal except for ALT of 35. To be scanned.   Reviewed records from Dr. Arnoldo Morale from back in December 2015. Presented with incisional hernia/umbilical hernia. Seen in follow-up back in February and released from care, felt to be doing well post surgery. No mentions regarding reflux surgery.  Repeat LFTs in one year.  Instructions for fatty liver: Recommend 1-2# weight loss per week until ideal body weight through exercise & diet. Low fat/cholesterol diet.   Avoid sweets, sodas, fruit juices, sweetened beverages like tea, etc. Gradually increase exercise from 15 min daily up to 1 hr per day 5 days/week. Limit alcohol use.

## 2014-06-16 ENCOUNTER — Other Ambulatory Visit: Payer: Self-pay

## 2014-06-16 DIAGNOSIS — K760 Fatty (change of) liver, not elsewhere classified: Secondary | ICD-10-CM

## 2014-06-16 DIAGNOSIS — K219 Gastro-esophageal reflux disease without esophagitis: Secondary | ICD-10-CM

## 2014-06-16 NOTE — Progress Notes (Signed)
Referral has been made to [facility]

## 2014-06-16 NOTE — Progress Notes (Signed)
Please let patient know that I discussed case with Dr. Gala Romney. He recommends referral to [facility] to discuss if she should consider re-do antireflux surgery. We can start with GI department but will likely then get referred to surgery if appropriate.

## 2014-06-16 NOTE — Progress Notes (Signed)
Pt is aware of all recommendations and instructions. Lab order on file. Will mail out some information about fatty liver to the pt.   She said it was ok to refer her to [facility].  Please refer pt.

## 2014-06-17 ENCOUNTER — Other Ambulatory Visit: Payer: Self-pay | Admitting: Gastroenterology

## 2014-06-17 DIAGNOSIS — K760 Fatty (change of) liver, not elsewhere classified: Secondary | ICD-10-CM

## 2014-10-11 ENCOUNTER — Encounter: Payer: Self-pay | Admitting: Internal Medicine

## 2014-11-01 ENCOUNTER — Ambulatory Visit (INDEPENDENT_AMBULATORY_CARE_PROVIDER_SITE_OTHER): Payer: Medicare HMO | Admitting: Gastroenterology

## 2014-11-01 ENCOUNTER — Encounter: Payer: Self-pay | Admitting: Gastroenterology

## 2014-11-01 VITALS — BP 130/77 | HR 56 | Temp 97.2°F | Ht 62.0 in | Wt 211.8 lb

## 2014-11-01 DIAGNOSIS — R195 Other fecal abnormalities: Secondary | ICD-10-CM

## 2014-11-01 DIAGNOSIS — K602 Anal fissure, unspecified: Secondary | ICD-10-CM

## 2014-11-01 DIAGNOSIS — K219 Gastro-esophageal reflux disease without esophagitis: Secondary | ICD-10-CM

## 2014-11-01 DIAGNOSIS — K649 Unspecified hemorrhoids: Secondary | ICD-10-CM | POA: Diagnosis not present

## 2014-11-01 DIAGNOSIS — K589 Irritable bowel syndrome without diarrhea: Secondary | ICD-10-CM | POA: Diagnosis not present

## 2014-11-01 DIAGNOSIS — K921 Melena: Secondary | ICD-10-CM

## 2014-11-01 MED ORDER — NITROGLYCERIN 0.4 % RE OINT: 1.0000 [in_us] | TOPICAL_OINTMENT | Freq: Two times a day (BID) | RECTAL | Status: DC

## 2014-11-01 MED ORDER — HYOSCYAMINE SULFATE 0.125 MG SL SUBL: 0.1250 mg | SUBLINGUAL_TABLET | Freq: Four times a day (QID) | SUBLINGUAL | Status: DC | PRN

## 2014-11-01 MED ORDER — HYDROCORTISONE 2.5 % RE CREA: 1.0000 "application " | TOPICAL_CREAM | Freq: Two times a day (BID) | RECTAL | Status: DC

## 2014-11-01 NOTE — Patient Instructions (Signed)
1. Please use Nitroglycerin ointment rectally twice per day alternating with anusol cream rectally twice a day. RXs sent to your pharmacy.  2. Stop Bentyl. 3. Trial of Levsin under tongue up to four times per day for abdominal cramping. Please hold for constipation.  4. I will discuss blood in stool with Dr. Gala Romney. You may need further imaging of your small intestines. Further recommendations to follow.

## 2014-11-01 NOTE — Progress Notes (Signed)
Primary Care Physician: Glenda Chroman., MD  Primary Gastroenterologist:  Garfield Cornea, MD   Chief Complaint  Patient presents with  . heme +  stool    HPI: Brittney Martinez is a 59 y.o. female here at the request of Dr. Woody Seller for further evaluation of heme+stool.  EGD/Ileocolonoscopy 10/2013 for epigastric pain, rectal bleeding, heme+stool. Distal esophageal pseudodiverticulum, loose but intact Nissen fundoplication. Internal hemorrhoids, ?occult fissure, cecal tubular adenoma.   Overall feels well. ?black stools every other week or so. No pepto. Occasional brbpr with rectal pain. Rectal pain with and without BM. ?hemorrhoids. Still uses Nitroglycerine per rectum at times with h/o anorectal fissure. Heartburn improved. Rarely uses bentyl or Amitiza. Intermittent lowe abd pain, crampy c/w IBS symptoms. Levsin used to help, bentyl doesn't help.    Since her last visit, she was seen by GI and general surgery at [facility] for consideration of redo Nissen fundoplication. Not felt to be good candidate. UGI series showed intact Nissen wrap 33m in diameter, esophagogastric fistula between the proximal extent of Nissen wrap into the gastric fundus.   Current Outpatient Prescriptions  Medication Sig Dispense Refill  . albuterol (PROVENTIL HFA;VENTOLIN HFA) 108 (90 BASE) MCG/ACT inhaler Inhale into the lungs every 6 (six) hours as needed for wheezing or shortness of breath.    .Marland Kitchenaspirin 81 MG tablet Take 81 mg by mouth daily.    .Marland Kitchenatorvastatin (LIPITOR) 10 MG tablet Take 10 mg by mouth daily.    . budesonide-formoterol (SYMBICORT) 160-4.5 MCG/ACT inhaler Inhale 2 puffs into the lungs 2 (two) times daily.    . calcium carbonate (OS-CAL) 600 MG TABS Take 600 mg by mouth 2 (two) times daily with a meal.    . clopidogrel (PLAVIX) 75 MG tablet Take 75 mg by mouth daily.      . colchicine 0.6 MG tablet Take 0.6 mg by mouth daily.    . cycloSPORINE (RESTASIS) 0.05 % ophthalmic emulsion Place 1 drop into  both eyes 2 (two) times daily.      .Marland Kitchendicyclomine (BENTYL) 10 MG capsule Take 10 mg by mouth 4 (four) times daily -  before meals and at bedtime.    .Marland Kitchendoxepin (SINEQUAN) 75 MG capsule Take 75 mg by mouth at bedtime.      .Marland Kitchenesomeprazole (NEXIUM) 40 MG capsule Take 1 capsule (40 mg total) by mouth 2 (two) times daily before a meal. 60 capsule 5  . folic acid (FOLVITE) 8497MCG tablet Take 800 mcg by mouth daily.     .Marland KitchenglipiZIDE (GLUCOTROL XL) 5 MG 24 hr tablet Take 10 mg by mouth 2 (two) times daily.      . insulin detemir (LEVEMIR) 100 UNIT/ML injection Inject 44 Units into the skin daily.    .Marland Kitchenlisinopril (PRINIVIL,ZESTRIL) 2.5 MG tablet Take 2.5 mg by mouth daily.    .Marland Kitchenlubiprostone (AMITIZA) 8 MCG capsule Take 1 capsule (8 mcg total) by mouth 2 (two) times daily with a meal. 60 capsule 11  . methotrexate (RHEUMATREX) 2.5 MG tablet Take 7.5 mg by mouth 2 (two) times a week. Caution:Chemotherapy. Protect from light.patient takes on Thursday and Friday Takes 3 tablets on Thursdays and 3 tablets on Fridays    . metoprolol (TOPROL-XL) 100 MG 24 hr tablet Take 50 mg by mouth 2 (two) times daily.       No current facility-administered medications for this visit.    Allergies as of 11/01/2014 - Review Complete 11/01/2014  Allergen Reaction  Noted  . Aspirin Nausea Only   . Propoxyphene n-acetaminophen Nausea And Vomiting   . Zithromax [azithromycin] Nausea Only 10/01/2010  . Erythromycin Rash 02/05/2014   Past Medical History  Diagnosis Date  . GERD (gastroesophageal reflux disease)   . TIA (transient ischemic attack)     2010. No deficits  . IBS (irritable bowel syndrome)   . Diverticulitis   . Asthma   . Anxiety   . Diabetes mellitus   . Hypertension   . Depression     grief  . Anal fissure   . Gout   . RA (rheumatoid arthritis)   . Abdominal aortic stenosis   . Glaucoma   . Sleep apnea     cpap; patient has old CPAP and not using and is waiting on new one.  . Complication of  anesthesia   . PONV (postoperative nausea and vomiting)    Past Surgical History  Procedure Laterality Date  . Cholecystectomy  2003  . Breast lumpectomy      right-benign  . Wrist surgery      left  . Elbow surgery      right  . Finger surgery      on right middle, pointer, and index fingers  . Abdominal hysterectomy  1982    complete  . Esophagogastroduodenoscopy  12/14/09    Dr. Gala Romney :schatzkis ring 25F, otherwise normal  . Colonoscopy  12/14/09    Dr. Gala Romney :anal papilla and hemorrhoids,diminutive hperplastic rectal polyps/normal colon  . Nissen fundoplication  0160    [facility]  . Fissurectomy      several  . Colonoscopy N/A 10/29/2013    FUX:NATF papilla and internal hemorrhoids; colonic polyps-removed as described above. I suspect benign anorectal bleeding in the setting of hemorrhoids and  possibly fissure. tubular adenoma. next TCS 10/2020.  Marland Kitchen Esophagogastroduodenoscopy N/A 10/29/2013    RMR: Distal esophageal pseudodiverticulum/Nissen fundoplication  . Incisional hernia repair N/A 03/22/2014    Procedure: [name] HERNIORRHAPHY;  Surgeon: Jamesetta So, MD;  Location: [facility];  Service: General;  Laterality: N/A;  . Insertion of mesh N/A 03/22/2014    Procedure: INSERTION OF MESH;  Surgeon: Jamesetta So, MD;  Location: [facility];  Service: General;  Laterality: N/A;    ROS:  General: Negative for anorexia, weight loss, fever, chills, fatigue, weakness. ENT: Negative for hoarseness, difficulty swallowing , nasal congestion. CV: Negative for chest pain, angina, palpitations, dyspnea on exertion, peripheral edema.  Respiratory: Negative for dyspnea at rest, dyspnea on exertion, cough, sputum, wheezing.  GI: See history of present illness. GU:  Negative for dysuria, hematuria, urinary incontinence, urinary frequency, nocturnal urination.  Endo: Negative for unusual weight change.    Physical Examination:   BP 130/77 mmHg  Pulse 56  Temp(Src) 97.2 F (36.2 C)  Ht  '5\' 2"'$  (1.575 m)  Wt 211 lb 12.8 oz (96.072 kg)  BMI 38.73 kg/m2  General: Well-nourished, well-developed in no acute distress.  Eyes: No icterus. Mouth: Oropharyngeal mucosa moist and pink , no lesions erythema or exudate. Lungs: Clear to auscultation bilaterally.  Heart: Regular rate and rhythm, no murmurs rubs or gallops.  Abdomen: Bowel sounds are normal, nontender, nondistended, no hepatosplenomegaly or masses, no abdominal bruits or hernia , no rebound or guarding.   Extremities: No lower extremity edema. No clubbing or deformities. Neuro: Alert and oriented x 4   Skin: Warm and dry, no jaundice.   Psych: Alert and cooperative, normal mood and affect.  Labs:  09/2014 BUN  9, Cre 0.72, Tbili 0.4, AP 81, AST 22, ALT 23, WBC 7900, H/H 12.2/37.6, Plat 311 Imaging Studies: No results found.

## 2014-11-07 NOTE — Assessment & Plan Note (Signed)
Trial of Levsin. Stop Bentyl.

## 2014-11-07 NOTE — Assessment & Plan Note (Signed)
Heme+stool in setting of brbpr, anorectal fissure/hemorrhoids. Questionable h/o melena. Normal H/H. EGD/ileocolonoscopy up to date. To discuss with Dr. Gala Romney. Heme + stool could be explained by anorectal disease. Further recommendations to follow.

## 2014-11-07 NOTE — Assessment & Plan Note (Signed)
Better controlled. Not felt to be good candidate for redo Nissen. Recent UGI series with evidence of esophagogastric fistula.

## 2014-11-09 NOTE — Progress Notes (Signed)
cc'ed to pcp °

## 2014-11-15 ENCOUNTER — Ambulatory Visit: Payer: Medicare HMO | Admitting: Gastroenterology

## 2014-12-07 NOTE — Progress Notes (Signed)
Please let patient know that Dr. Gala Romney recommends just following H/H right now. Recent heme+stools likely insignificant in setting of normal H/H and up to date EGD/TCS.  Recheck CBC in 8 weeks.

## 2014-12-13 ENCOUNTER — Other Ambulatory Visit (HOSPITAL_COMMUNITY): Payer: Self-pay | Admitting: Internal Medicine

## 2014-12-13 DIAGNOSIS — Z1231 Encounter for screening mammogram for malignant neoplasm of breast: Secondary | ICD-10-CM

## 2015-01-03 ENCOUNTER — Telehealth: Payer: Self-pay | Admitting: Internal Medicine

## 2015-01-03 NOTE — Progress Notes (Signed)
Tried to call pt- NA 

## 2015-01-03 NOTE — Telephone Encounter (Signed)
Per [facility] note, pt needs blood work in about 8 weeks (see ov note) tried to call pt- [facility] with recommendations. Lab orders are on file.

## 2015-01-03 NOTE — Telephone Encounter (Signed)
PATIENT CALLED INQUIRING IF SHE WAS SUPPOSED TO MAKE A FOLLOW UP APPOINTMENT  PLEASE ADVISE [phone]

## 2015-01-03 NOTE — Progress Notes (Signed)
Tried to call pt again, [facility] with recommendations. Lab orders are on file.

## 2015-01-05 ENCOUNTER — Other Ambulatory Visit: Payer: Self-pay | Admitting: Gastroenterology

## 2015-01-05 DIAGNOSIS — K921 Melena: Secondary | ICD-10-CM

## 2015-01-17 ENCOUNTER — Other Ambulatory Visit: Payer: Self-pay

## 2015-01-17 DIAGNOSIS — K921 Melena: Secondary | ICD-10-CM

## 2015-03-03 ENCOUNTER — Ambulatory Visit (HOSPITAL_COMMUNITY)
Admission: RE | Admit: 2015-03-03 | Discharge: 2015-03-03 | Disposition: A | Discharge status: Home / Self Care | Payer: Medicare HMO | Source: Ambulatory Visit | Attending: Internal Medicine | Admitting: Internal Medicine

## 2015-03-03 DIAGNOSIS — Z1231 Encounter for screening mammogram for malignant neoplasm of breast: Secondary | ICD-10-CM | POA: Insufficient documentation

## 2015-06-03 ENCOUNTER — Other Ambulatory Visit: Payer: Self-pay

## 2015-06-03 DIAGNOSIS — K760 Fatty (change of) liver, not elsewhere classified: Secondary | ICD-10-CM

## 2015-06-16 ENCOUNTER — Other Ambulatory Visit: Payer: Self-pay | Admitting: Gastroenterology

## 2015-07-01 LAB — HEPATIC FUNCTION PANEL
ALT: 28 U/L (ref 6–29)
AST: 20 U/L (ref 10–35)
Albumin: 3.5 g/dL — ABNORMAL LOW (ref 3.6–5.1)
Alkaline Phosphatase: 71 U/L (ref 33–130)
Bilirubin, Direct: 0.1 mg/dL (ref <=0.2)
Indirect Bilirubin: 0.5 mg/dL (ref 0.2–1.2)
Total Bilirubin: 0.6 mg/dL (ref 0.2–1.2)
Total Protein: 6.6 g/dL (ref 6.1–8.1)

## 2015-07-11 ENCOUNTER — Encounter: Payer: Self-pay | Admitting: Internal Medicine

## 2015-07-11 NOTE — Progress Notes (Signed)
Quick Note:  LFTs normal.  OV with RMR only (he hasn't seen patient in 2 years) nonurgent.IBS, fatty liver, GERD ______

## 2015-07-11 NOTE — Progress Notes (Signed)
APPT MADE AND LETTER SENT  °

## 2015-07-26 ENCOUNTER — Telehealth: Payer: Self-pay

## 2015-07-26 ENCOUNTER — Encounter: Payer: Self-pay | Admitting: Internal Medicine

## 2015-07-26 ENCOUNTER — Ambulatory Visit (INDEPENDENT_AMBULATORY_CARE_PROVIDER_SITE_OTHER): Payer: Medicare Other | Admitting: Internal Medicine

## 2015-07-26 VITALS — BP 150/88 | HR 83 | Temp 98.2°F | Ht 62.0 in | Wt 204.4 lb

## 2015-07-26 DIAGNOSIS — K5909 Other constipation: Secondary | ICD-10-CM | POA: Diagnosis not present

## 2015-07-26 DIAGNOSIS — D649 Anemia, unspecified: Secondary | ICD-10-CM

## 2015-07-26 DIAGNOSIS — K602 Anal fissure, unspecified: Secondary | ICD-10-CM

## 2015-07-26 NOTE — Telephone Encounter (Signed)
Per Dr. Gala Romney, I called the following to [facility].  Nitroglycerin 0.125% ointment  30 gm Apply to anorectum 4 times a day  With one refill.

## 2015-07-26 NOTE — Progress Notes (Signed)
Primary Care Physician:  Glenda Chroman, MD Primary Gastroenterologist:  Dr. Gala Romney  Pre-Procedure History & Physical: HPI:  Brittney Martinez is a 60 y.o. female here for evaluation of recurrent anal fissure;  patient reports burning, lancinating pains emanating from her anal canal associated with a bowel movement. She occasionally has diarrhea but is mostly constipated. Takes Amitiza 8 twice a day. She feels this regimen does not produce good evacuation;  bloating/discomfort not totally alleviated with a bowel movement. She is up-to-date with EGD and colonoscopy; due for surveillance colonoscopy 2020; history of colonic adenoma  History of intact Nissen wrap however, still requires Nexium 40 mg twice a day to control symptoms. She drop back to once daily she has a flare. No dysphagia. Has a severe headache after she takes nitroglycerin 0.4% topically. LFTs  normal recently; on methotrexate for RA.  Past Medical History  Diagnosis Date  . GERD (gastroesophageal reflux disease)   . TIA (transient ischemic attack)     2010. No deficits  . IBS (irritable bowel syndrome)   . Diverticulitis   . Asthma   . Anxiety   . Diabetes mellitus   . Hypertension   . Depression     grief  . Anal fissure   . Gout   . RA (rheumatoid arthritis) ([place])   . Abdominal aortic stenosis   . Glaucoma   . Sleep apnea     cpap; patient has old CPAP and not using and is waiting on new one.  . Complication of anesthesia   . PONV (postoperative nausea and vomiting)     Past Surgical History  Procedure Laterality Date  . Cholecystectomy  2003  . Breast lumpectomy      right-benign  . Wrist surgery      left  . Elbow surgery      right  . Finger surgery      on right middle, pointer, and index fingers  . Abdominal hysterectomy  1982    complete  . Esophagogastroduodenoscopy  12/14/09    Dr. Gala Romney :schatzkis ring 61F, otherwise normal  . Colonoscopy  12/14/09    Dr. Gala Romney :anal papilla and  hemorrhoids,diminutive hperplastic rectal polyps/normal colon  . Nissen fundoplication  4132    [facility]  . Fissurectomy      several  . Colonoscopy N/A 10/29/2013    GMW:NUUV papilla and internal hemorrhoids; colonic polyps-removed as described above. I suspect benign anorectal bleeding in the setting of hemorrhoids and  possibly fissure. tubular adenoma. next TCS 10/2020.  Marland Kitchen Esophagogastroduodenoscopy N/A 10/29/2013    RMR: Distal esophageal pseudodiverticulum/Nissen fundoplication  . Incisional hernia repair N/A 03/22/2014    Procedure: [name] HERNIORRHAPHY;  Surgeon: Jamesetta So, MD;  Location: [facility];  Service: General;  Laterality: N/A;  . Insertion of mesh N/A 03/22/2014    Procedure: INSERTION OF MESH;  Surgeon: Jamesetta So, MD;  Location: [facility];  Service: General;  Laterality: N/A;    Prior to Admission medications   Medication Sig Start Date End Date Taking? Authorizing Provider  albuterol (PROVENTIL HFA;VENTOLIN HFA) 108 (90 BASE) MCG/ACT inhaler Inhale into the lungs every 6 (six) hours as needed for wheezing or shortness of breath.   Yes Historical Provider, MD  AMITIZA 8 MCG capsule TAKE 1 CAPSULE TWICE DAILY WITH MEALS. 06/20/15  Yes Mahala Menghini, PA-C  aspirin 81 MG tablet Take 81 mg by mouth daily.   Yes Historical Provider, MD  atorvastatin (LIPITOR) 10 MG tablet  Take 10 mg by mouth daily.   Yes Historical Provider, MD  budesonide-formoterol (SYMBICORT) 160-4.5 MCG/ACT inhaler Inhale 2 puffs into the lungs 2 (two) times daily.   Yes Historical Provider, MD  calcium carbonate (OS-CAL) 600 MG TABS Take 600 mg by mouth 2 (two) times daily with a meal.   Yes Historical Provider, MD  clopidogrel (PLAVIX) 75 MG tablet Take 75 mg by mouth daily.     Yes Historical Provider, MD  colchicine 0.6 MG tablet Take 0.6 mg by mouth daily.   Yes Historical Provider, MD  cycloSPORINE (RESTASIS) 0.05 % ophthalmic emulsion Place 1 drop into both eyes 2 (two) times daily.     Yes  Historical Provider, MD  doxepin (SINEQUAN) 75 MG capsule Take 75 mg by mouth at bedtime.     Yes Historical Provider, MD  esomeprazole (NEXIUM) 40 MG capsule Take 1 capsule (40 mg total) by mouth 2 (two) times daily before a meal. 05/12/14  Yes Mahala Menghini, PA-C  folic acid (FOLVITE) 188 MCG tablet Take 800 mcg by mouth daily.    Yes Historical Provider, MD  glipiZIDE (GLUCOTROL XL) 5 MG 24 hr tablet Take 10 mg by mouth 2 (two) times daily.     Yes Historical Provider, MD  hydrocortisone (ANUSOL-HC) 2.5 % rectal cream Place 1 application rectally 2 (two) times daily. 11/01/14  Yes Mahala Menghini, PA-C  hyoscyamine (LEVSIN SL) 0.125 MG SL tablet Place 1 tablet (0.125 mg total) under the tongue every 6 (six) hours as needed for cramping. 11/01/14  Yes Mahala Menghini, PA-C  insulin detemir (LEVEMIR) 100 UNIT/ML injection Inject 70 Units into the skin daily.    Yes Historical Provider, MD  lisinopril (PRINIVIL,ZESTRIL) 2.5 MG tablet Take 2.5 mg by mouth daily.   Yes Historical Provider, MD  methotrexate (RHEUMATREX) 2.5 MG tablet Take 7.5 mg by mouth 2 (two) times a week. Caution:Chemotherapy. Protect from light.patient takes on Thursday and Friday Takes 3 tablets on Thursdays and 3 tablets on Fridays   Yes Historical Provider, MD  metoprolol (TOPROL-XL) 100 MG 24 hr tablet Take 50 mg by mouth 2 (two) times daily.     Yes Historical Provider, MD  Nitroglycerin (RECTIV) 0.4 % OINT Place 1 inch rectally 2 (two) times daily. 11/01/14  Yes Mahala Menghini, PA-C  sucralfate (CARAFATE) 1 GM/10ML suspension Take 1 g by mouth 4 (four) times daily.   Yes Historical Provider, MD    Allergies as of 07/26/2015 - Review Complete 07/26/2015  Allergen Reaction Noted  . Aspirin Nausea Only   . Propoxyphene n-acetaminophen Nausea And Vomiting   . Zithromax [azithromycin] Nausea Only 10/01/2010  . Erythromycin Rash 02/05/2014    Family History  Problem Relation Age of Onset  . Colon cancer Father 82  .  Hypertension Sister   . Hypertension Brother   . Kidney disease Brother     Social History   Social History  . Marital Status: Widowed    Spouse Name: N/A  . Number of Children: N/A  . Years of Education: N/A   Occupational History  . disabled    Social History Main Topics  . Smoking status: Former Smoker -- 0.25 packs/day for 19 years    Types: Cigarettes    Quit date: 01/09/2007  . Smokeless tobacco: Former Systems developer    Quit date: 01/09/2008     Comment: Trying to quit/ one pack every 3 days  . Alcohol Use: No  . Drug Use: No  . Sexual  Activity: No   Other Topics Concern  . Not on file   Social History Narrative   1 son-healthy   1 daughter-MVA (drunk-driver)    Review of Systems: See HPI, otherwise negative ROS  Physical Exam: BP 150/88 mmHg  Pulse 83  Temp(Src) 98.2 F (36.8 C)  Ht '5\' 2"'$  (1.575 m)  Wt 204 lb 6.4 oz (92.715 kg)  BMI 37.38 kg/m2 General:   Alert,  Well-developed, well-nourished, pleasant and cooperative in NAD Mouth:  No deformity or lesions. Neck:  Supple; no masses or thyromegaly. No significant cervical adenopathy. Lungs:  Clear throughout to auscultation.   No wheezes, crackles, or rhonchi. No acute distress. Heart:  Regular rate and rhythm; no murmurs, clicks, rubs,  or gallops. Abdomen: Non-distended, normal bowel sounds.  Soft and nontender without appreciable mass or hepatosplenomegaly.  Pulses:  Normal pulses noted. Rectal: No external lesions. Anal canal exquisitely tender for digital exam. Exam terminated because of discomfort. Complete rectal exam not doable today. Extremities:  Without clubbing or edema.  Impression:  Pleasant 60 year old lady with long-standing GERD well-controlled on twice a day PPI therapy on top of a prior Nissen fundoplication. Anal pain/proctalgia almost certainly related to a recurrent anal fissure. Chronic constipation Likely contributing to this problem.. Patient feels Amitiza is  Not effective. She noted  headache with the nitroglycerin 0.4% topically. History of anemia without followup H&H.   Recommendations:   Continue Nexium 40 mg twice daily  Stop nitroglycerin 0.4% ointment  Begin nitroglycerin 0.125% ointment-apply pea-sized amount to the anorectum 4 times a day (dispense 30 g with one refill)  Stop Amitiza; begin Linzess 145 1 capsule daily; call us in 2 weeks and let us know how it has worked for you  CBC to follow-up on anemia  I told patient may need a surgical procedure if current medical plan of management for fissure not effective.  Further recommendations to follow.      Notice: This dictation was prepared with Dragon dictation along with smaller phrase technology. Any transcriptional errors that result from this process are unintentional and may not be corrected upon review.

## 2015-07-26 NOTE — Patient Instructions (Signed)
Continue Nexium 40 mg twice daily  Stop nitroglycerin 0.4% ointment  Begin nitroglycerin 0.125% ointment-apply pea-sized amount to the anorectum 4 times a day (dispense 30 g with one refill)  Stop Amitiza; begin Linzess 145 1 capsule daily; call us in 2 weeks and let us know how it has worked for you  CBC to follow-up on anemia  Further recommendations to follow.

## 2015-08-02 LAB — CBC WITH DIFFERENTIAL/PLATELET
Basophils Absolute: 91 cells/uL (ref 0–200)
Basophils Relative: 1 %
Eosinophils Absolute: 91 cells/uL (ref 15–500)
Eosinophils Relative: 1 %
HCT: 39.7 % (ref 35.0–45.0)
Hemoglobin: 13.2 g/dL (ref 11.7–15.5)
Lymphocytes Relative: 37 %
Lymphs Abs: 3367 cells/uL (ref 850–3900)
MCH: 30.6 pg (ref 27.0–33.0)
MCHC: 33.2 g/dL (ref 32.0–36.0)
MCV: 91.9 fL (ref 80.0–100.0)
MPV: 9.9 fL (ref 7.5–12.5)
Monocytes Absolute: 637 cells/uL (ref 200–950)
Monocytes Relative: 7 %
Neutro Abs: 4914 cells/uL (ref 1500–7800)
Neutrophils Relative %: 54 %
Platelets: 284 10*3/uL (ref 140–400)
RBC: 4.32 MIL/uL (ref 3.80–5.10)
RDW: 15.7 % — ABNORMAL HIGH (ref 11.0–15.0)
WBC: 9.1 10*3/uL (ref 3.8–10.8)

## 2015-08-29 ENCOUNTER — Telehealth: Payer: Self-pay | Admitting: Internal Medicine

## 2015-08-29 NOTE — Telephone Encounter (Signed)
PATIENT CALLED WITH UPDATE AND STATES THAT HER STOMACH HURTS AFTER SHE EATS.  [phone]

## 2015-08-31 NOTE — Telephone Encounter (Signed)
Spoke with the Brittney Martinez, she said she has been having problems about 3 weeks, she first has constipation for several days and then will have diarrhea for several days. Her stomach hurts constantly and is worse after eating. +N , no V, no fever, no visible blood in stool. Brittney Martinez said linzess worked the first week but then stopped working. She has also tried [place] in the past. She is aware that RMR is on vacation this week and I will have to talk with someone else. Please advise.

## 2015-09-01 NOTE — Telephone Encounter (Signed)
I feel she may have IBS-C predominant. Even though she is having diarrhea, it sounds as if she is not experiencing the most productive BMs, which could possibly result in overflow incontinence. Let's have her trial Linzess 290 mcg once each morning. Let's have her come back in about 4 weeks for reassessment.

## 2015-09-02 NOTE — Telephone Encounter (Signed)
Tried to call- NA 

## 2015-09-05 NOTE — Telephone Encounter (Signed)
PT is aware. Samples of Linzess 290 mcg #12 at front for pick up. Appt with Walden Field, NP on 10/12/2015 at 1:30 Pm.

## 2015-10-12 ENCOUNTER — Ambulatory Visit: Payer: Medicare Other | Admitting: Nurse Practitioner

## 2015-11-09 ENCOUNTER — Ambulatory Visit: Payer: Medicare Other | Admitting: Nurse Practitioner

## 2015-11-21 ENCOUNTER — Ambulatory Visit (INDEPENDENT_AMBULATORY_CARE_PROVIDER_SITE_OTHER): Payer: Medicare Other | Admitting: Nurse Practitioner

## 2015-11-21 ENCOUNTER — Encounter: Payer: Self-pay | Admitting: Nurse Practitioner

## 2015-11-21 VITALS — BP 135/79 | HR 64 | Temp 97.8°F | Ht 62.0 in | Wt 200.2 lb

## 2015-11-21 DIAGNOSIS — R195 Other fecal abnormalities: Secondary | ICD-10-CM | POA: Diagnosis not present

## 2015-11-21 DIAGNOSIS — K6289 Other specified diseases of anus and rectum: Secondary | ICD-10-CM | POA: Diagnosis not present

## 2015-11-21 DIAGNOSIS — K589 Irritable bowel syndrome without diarrhea: Secondary | ICD-10-CM | POA: Diagnosis not present

## 2015-11-21 MED ORDER — PLECANATIDE 3 MG PO TABS: 3.0000 mg | ORAL_TABLET | Freq: Every day | ORAL | 0 refills | Status: DC

## 2015-11-21 NOTE — Assessment & Plan Note (Signed)
Rectal pain is improving with nitroglycerin ointment. Headaches much improved with lower dose of ointment. Continue to use, return for follow-up in 2 months.

## 2015-11-21 NOTE — Progress Notes (Signed)
Referring Provider: Glenda Chroman, MD Primary Care Physician:  Glenda Chroman, MD Primary GI:  Dr. Gala Romney  Chief Complaint  Patient presents with  . Follow-up    HPI:   Brittney Martinez is a 60 y.o. female who presents For follow-up on constipation which failed Linzess as well as heme positive stool per PCP. Last seen in our office 07/26/2015 noted history of intact Nissen wrap, requiring Nexium 40 mg twice a day to control GERD symptoms, no dysphagia, on methotrexate for rheumatoid arthritis. Up-to-date with EGD and colonoscopy, due for surveillance colonoscopy in 2020. Recommended stopNTG 0.4% ointment, try NTG 0.125% oiltment for anal fissue with painful bowel movements, continue Nexium, stop Amitiza, start Linzess 145 mcg daily. CBC to follow-up on anemia. May need surgical evaluation for fissure if current conservative measures not effective. CBC completed 30 2017 which is essentially normal. No further anemia.  The patient called in to our office on 08/29/2015 and noted abdominal pain after eating for about 3 weeks, constipated for several days and will have diarrhea for several days. Abdominal pain worse after eating, admits nausea no vomiting. No visible blood. We'll Linzess worked at first but then stopped, has also tried and failed Amitiza in the past. AS felt she may be having IBS see predominant with overflow diarrhea. She was started on Linzess 290 g daily and recommended for repeat follow-up.  Today she states she's doing ok. Linzess not helping at 290 mcg dose. NTG ointment his helping anal fissue, headaches improved with lower dose. Admits lower abdominal cramping pain, worse after eating. Pain improves after bowel movement. Has a bowel movement about every 3-4 days but noted sensation of incomplete emptying. Heme stool test was heme positive, has not seen blood in her stools. Occasionally has dark formed stools. Denies chest pain, dyspnea, syncope, near syncope. Has been having new  onset dizziness as of about 2-3 weeks ago around the time her brother passed away suddenly and thinks some of the dizziness may be stress-related. Denies chest pain, dyspnea, dizziness, lightheadedness, syncope, near syncope. Denies any other upper or lower GI symptoms.  Last TCS in 2015 notes bleeding likely internal hemorrhoids and anal fissure. Has struggles with hemorrhoids for a number of years. Takes Ibuprofen about twice a week, denies ASA powders.  Past Medical History:  Diagnosis Date  . Abdominal aortic stenosis   . Anal fissure   . Anxiety   . Asthma   . Complication of anesthesia   . Depression    grief  . Diabetes mellitus   . Diverticulitis   . GERD (gastroesophageal reflux disease)   . Glaucoma   . Gout   . Hypertension   . IBS (irritable bowel syndrome)   . PONV (postoperative nausea and vomiting)   . RA (rheumatoid arthritis) ([place])   . Sleep apnea    cpap; patient has old CPAP and not using and is waiting on new one.  Marland Kitchen TIA (transient ischemic attack)    2010. No deficits    Past Surgical History:  Procedure Laterality Date  . ABDOMINAL HYSTERECTOMY  1982   complete  . BREAST LUMPECTOMY     right-benign  . CHOLECYSTECTOMY  2003  . COLONOSCOPY  12/14/09   Dr. Gala Romney :anal papilla and hemorrhoids,diminutive hperplastic rectal polyps/normal colon  . COLONOSCOPY N/A 10/29/2013   ZOX:WRUE papilla and internal hemorrhoids; colonic polyps-removed as described above. I suspect benign anorectal bleeding in the setting of hemorrhoids and  possibly fissure. tubular adenoma.  next TCS 10/2020.  . ELBOW SURGERY     right  . ESOPHAGOGASTRODUODENOSCOPY  12/14/09   Dr. Gala Romney :schatzkis ring 42F, otherwise normal  . ESOPHAGOGASTRODUODENOSCOPY N/A 10/29/2013   RMR: Distal esophageal pseudodiverticulum/Nissen fundoplication  . FINGER SURGERY     on right middle, pointer, and index fingers  . FISSURECTOMY     several  . INCISIONAL HERNIA REPAIR N/A 03/22/2014   Procedure:  [name] HERNIORRHAPHY;  Surgeon: Jamesetta So, MD;  Location: [facility];  Service: General;  Laterality: N/A;  . INSERTION OF MESH N/A 03/22/2014   Procedure: INSERTION OF MESH;  Surgeon: Jamesetta So, MD;  Location: [facility];  Service: General;  Laterality: N/A;  . NISSEN FUNDOPLICATION  9622   [facility] [place]  . WRIST SURGERY     left    Current Outpatient Prescriptions  Medication Sig Dispense Refill  . albuterol (PROVENTIL HFA;VENTOLIN HFA) 108 (90 BASE) MCG/ACT inhaler Inhale into the lungs every 6 (six) hours as needed for wheezing or shortness of breath.    Marland Kitchen aspirin 81 MG tablet Take 81 mg by mouth daily.    Marland Kitchen atorvastatin (LIPITOR) 10 MG tablet Take 10 mg by mouth daily.    . budesonide-formoterol (SYMBICORT) 160-4.5 MCG/ACT inhaler Inhale 2 puffs into the lungs 2 (two) times daily.    . calcium carbonate (OS-CAL) 600 MG TABS Take 600 mg by mouth 2 (two) times daily with a meal.    . clopidogrel (PLAVIX) 75 MG tablet Take 75 mg by mouth daily.      . colchicine 0.6 MG tablet Take 0.6 mg by mouth daily.    . cycloSPORINE (RESTASIS) 0.05 % ophthalmic emulsion Place 1 drop into both eyes 2 (two) times daily.      Marland Kitchen doxepin (SINEQUAN) 75 MG capsule Take 75 mg by mouth at bedtime.      Marland Kitchen esomeprazole (NEXIUM) 40 MG capsule Take 1 capsule (40 mg total) by mouth 2 (two) times daily before a meal. 60 capsule 5  . folic acid (FOLVITE) 297 MCG tablet Take 800 mcg by mouth daily.     Marland Kitchen glipiZIDE (GLUCOTROL XL) 5 MG 24 hr tablet Take 10 mg by mouth 2 (two) times daily.      . hydrocortisone (ANUSOL-HC) 2.5 % rectal cream Place 1 application rectally 2 (two) times daily. 30 g 0  . hyoscyamine (LEVSIN SL) 0.125 MG SL tablet Place 1 tablet (0.125 mg total) under the tongue every 6 (six) hours as needed for cramping. 120 tablet 0  . insulin detemir (LEVEMIR) 100 UNIT/ML injection Inject 70 Units into the skin daily.     Marland Kitchen linaclotide (LINZESS) 290 MCG CAPS capsule Take 290 mcg by mouth daily before  breakfast.    . lisinopril (PRINIVIL,ZESTRIL) 2.5 MG tablet Take 2.5 mg by mouth daily.    . methotrexate (RHEUMATREX) 2.5 MG tablet Take 7.5 mg by mouth 2 (two) times a week. Caution:Chemotherapy. Protect from light.patient takes on Thursday and Friday Takes 3 tablets on Thursdays and 3 tablets on Fridays    . metoprolol (TOPROL-XL) 100 MG 24 hr tablet Take 50 mg by mouth 2 (two) times daily.      . Nitroglycerin (RECTIV) 0.4 % OINT Place 1 inch rectally 2 (two) times daily. 30 g 0  . sucralfate (CARAFATE) 1 GM/10ML suspension Take 1 g by mouth 4 (four) times daily.     No current facility-administered medications for this visit.     Allergies as of 11/21/2015 - Review Complete  11/21/2015  Allergen Reaction Noted  . Aspirin Nausea Only   . Propoxyphene n-acetaminophen Nausea And Vomiting   . Zithromax [azithromycin] Nausea Only 10/01/2010  . Erythromycin Rash 02/05/2014    Family History  Problem Relation Age of Onset  . Colon cancer Father 63  . Hypertension Sister   . Hypertension Brother   . Kidney disease Brother     Social History   Social History  . Marital status: Widowed    Spouse name: N/A  . Number of children: N/A  . Years of education: N/A   Occupational History  . disabled    Social History Main Topics  . Smoking status: Former Smoker    Packs/day: 0.25    Years: 19.00    Types: Cigarettes    Quit date: 01/09/2007  . Smokeless tobacco: Never Used  . Alcohol use No  . Drug use: No  . Sexual activity: No   Other Topics Concern  . None   Social History Narrative   1 son-healthy   1 daughter-MVA (drunk-driver)    Review of Systems: General: Negative for anorexia, weight loss, fever, chills, fatigue, weakness. ENT: Negative for hoarseness, difficulty swallowing. CV: Negative for chest pain, angina, palpitations, peripheral edema.  Respiratory: Negative for dyspnea at rest, cough, sputum, wheezing.  GI: See history of present illness. Endo:  Negative for unusual weight change.  Heme: Negative for bruising or bleeding.   Physical Exam: BP 135/79   Pulse 64   Temp 97.8 F (36.6 C) (Oral)   Ht '5\' 2"'$  (1.575 m)   Wt 200 lb 3.2 oz (90.8 kg)   BMI 36.62 kg/m  General:   Obese female, alert and oriented. Pleasant and cooperative. Well-nourished and well-developed.  Head:  Normocephalic and atraumatic. Eyes:  Without icterus, sclera clear and conjunctiva pink.  Ears:  Normal auditory acuity. Cardiovascular:  S1, S2 present without murmurs appreciated. Extremities without clubbing or edema. Respiratory:  Clear to auscultation bilaterally. No wheezes, rales, or rhonchi. No distress.  Gastrointestinal:  +BS, rounded but soft, non-tender and non-distended. No HSM noted. No guarding or rebound. No masses appreciated.  Rectal:  Deferred  Musculoskalatal:  Symmetrical without gross deformities. Skin:  Intact without significant lesions or rashes. Neurologic:  Alert and oriented x4;  grossly normal neurologically. Psych:  Alert and cooperative. Normal mood and affect. Heme/Lymph/Immune: No excessive bruising noted.    11/21/2015 12:21 PM   Disclaimer: This note was dictated with voice recognition software. Similar sounding words can inadvertently be transcribed and may not be corrected upon review.

## 2015-11-21 NOTE — Progress Notes (Signed)
cc'ed to pcp °

## 2015-11-21 NOTE — Patient Instructions (Addendum)
1. Start taking Trulance 3 mg once a day. 2. Call us in 2 weeks and let us know how it is working. 3. Have your blood work drawn when you're able to. 4. Start taking Colace over-the-counter stool softener every day. 5. Return for follow-up in 2 months.

## 2015-11-21 NOTE — Assessment & Plan Note (Signed)
Continues with constipation and lower abdominal cramping pain. Pain improves with bowel movement. She is failed Amitiza 8 g, Linzess 145 g, Linzess to 90 g. Today I'll trial her on Trulance 3 mg once a day with or without food. If she fails this, we do have an option to try Amitiza 24 g. If she fails the higher dose Amitiza we will likely have to return to her previously tried option and add additional measures such as MiraLAX. Also recommend a stool softener today. Return for follow-up in 2 months.

## 2015-11-21 NOTE — Assessment & Plan Note (Signed)
Patient told she has heme positive stool by primary care. This is not surprising given her known history of significant internal hemorrhoids, anal fissure, ongoing constipation problems. Her colonoscopy and endoscopy are up-to-date in 2015. Does take NSAIDs about twice a week. At this point I will check a CBC for reassurance. I feel it is best to try to control her constipation problems, continue conservative measures for hemorrhoids and/or anal fissure. Can revisit the idea about possible needed endoscopic evaluation depending on progression once her symptoms are resolved. In the future she may benefit from hemorrhoid banding versus surgical evaluation for anal fissure/internal hemorrhoids. Return for follow-up in 2 months.

## 2016-01-24 ENCOUNTER — Encounter: Payer: Self-pay | Admitting: Nurse Practitioner

## 2016-01-24 ENCOUNTER — Ambulatory Visit (INDEPENDENT_AMBULATORY_CARE_PROVIDER_SITE_OTHER): Payer: Medicare Other | Admitting: Nurse Practitioner

## 2016-01-24 VITALS — BP 127/71 | HR 67 | Temp 98.1°F | Ht 61.5 in | Wt 195.8 lb

## 2016-01-24 DIAGNOSIS — K581 Irritable bowel syndrome with constipation: Secondary | ICD-10-CM

## 2016-01-24 DIAGNOSIS — A09 Infectious gastroenteritis and colitis, unspecified: Secondary | ICD-10-CM | POA: Diagnosis not present

## 2016-01-24 DIAGNOSIS — R197 Diarrhea, unspecified: Secondary | ICD-10-CM

## 2016-01-24 NOTE — Patient Instructions (Signed)
1. We will provide you with cups to collect a stool sample. 2. With your next watery stool, collect a sample and bring it to the lab across the street from the hospital. 3. At that time, have your blood tests drawn. 4. Try to stay hydrated. 5. If your stool studies come back negative for bacteria, we can provide you with education to help with abdominal cramping and diarrhea. 6. Call us if you get worse. If you begin having weakness, dizziness, inability to keep down food and fluids, or other very concerning symptoms proceed to the emergency room. 7. Return for follow-up in 4 weeks.    Have a Happy Thanksgiving!!

## 2016-01-24 NOTE — Assessment & Plan Note (Signed)
At her last visit she was tried on Trulance which causes upset stomach and she subsequently stopped taking it. She would like to go back to "the first medicine you tried me on." However, she is not sure which medicines that is and she has been tried on multiple options at varying doses. Currently she is having diarrhea as noted below, possible gastroenteritis. I will see her back in 4 weeks when her diarrhea is cleared so we can see where her bowel movements are at and make a decision as what option to try.

## 2016-01-24 NOTE — Progress Notes (Signed)
Referring Provider: Glenda Chroman, MD Primary Care Physician:  Glenda Chroman, MD Primary GI:  Dr. Gala Romney  Chief Complaint  Patient presents with  . Irritable Bowel Syndrome    diarrhea  . Rectal Bleeding    + hemoccult  . Hemorrhoids    HPI:   Brittney Martinez is a 60 y.o. female who presents For follow-up on IBS diarrhea-type, rectal bleeding, hemorrhoids. The patient was last seen in our office 11/21/2015 for rectal pain, heme positive stool, and IBS. At her last visit she seemed to be having more constipation and Linzess 290 mcg was not helping. She was started on Trulance to see if this would be more effective. The patient was also noted to have heme positive stool by primary care, and noted that she has struggled with hemorrhoids for years. Colonoscopy and endoscopy up-to-date 2015 which noted heme positive stool likely due to hemorrhoids. Takes NSAIDs twice a week. CBC was rechecked for reassurance but deemed best course of action to control constipation to prevent hemorrhoid flares. It should be noted she has failed Amitiza 8 g, Linzess 145 g, Linzess 290 g. Recommended two-month follow-up.  Today she states she's doing ok. Took Trulance a few times and "it made my stomach grouchy so I stopped taking it." When she stopped taking Trulance, started taking a stool softener which did "ok but made me too loose." She cannot remember what she was taking. Started having diarrhea 3 days ago, has had 10+ stools a day, watery. Made her rectum sore. Denies overt blood. Took two Levsin to help with the associated stomach cramps. Also with nausea. Yesterday she had 7 watery stools, still with stomach cramps and nausea. Denies chest pain, dyspnea, dizziness, lightheadedness, syncope, near syncope. Denies any other upper or lower GI symptoms.  Is able to drink enough fluids to stay hydrated.  Past Medical History:  Diagnosis Date  . Abdominal aortic stenosis   . Anal fissure   . Anxiety   .  Asthma   . Complication of anesthesia   . Depression    grief  . Diabetes mellitus   . Diverticulitis   . GERD (gastroesophageal reflux disease)   . Glaucoma   . Gout   . Hypertension   . IBS (irritable bowel syndrome)   . PONV (postoperative nausea and vomiting)   . RA (rheumatoid arthritis) ([place])   . Sleep apnea    cpap; patient has old CPAP and not using and is waiting on new one.  Marland Kitchen TIA (transient ischemic attack)    2010. No deficits    Past Surgical History:  Procedure Laterality Date  . ABDOMINAL HYSTERECTOMY  1982   complete  . BREAST LUMPECTOMY     right-benign  . CHOLECYSTECTOMY  2003  . COLONOSCOPY  12/14/09   Dr. Gala Romney :anal papilla and hemorrhoids,diminutive hperplastic rectal polyps/normal colon  . COLONOSCOPY N/A 10/29/2013   [facility]:ERDE papilla and internal hemorrhoids; colonic polyps-removed as described above. I suspect benign anorectal bleeding in the setting of hemorrhoids and  possibly fissure. tubular adenoma. next TCS 10/2020.  . ELBOW SURGERY     right  . ESOPHAGOGASTRODUODENOSCOPY  12/14/09   Dr. Gala Romney :schatzkis ring 27F, otherwise normal  . ESOPHAGOGASTRODUODENOSCOPY N/A 10/29/2013   RMR: Distal esophageal pseudodiverticulum/Nissen fundoplication  . FINGER SURGERY     on right middle, pointer, and index fingers  . FISSURECTOMY     several  . INCISIONAL HERNIA REPAIR N/A 03/22/2014   Procedure: INCISIONAL HERNIORRHAPHY;  Surgeon: Jamesetta So, MD;  Location: [facility];  Service: General;  Laterality: N/A;  . INSERTION OF MESH N/A 03/22/2014   Procedure: INSERTION OF MESH;  Surgeon: Jamesetta So, MD;  Location: [facility];  Service: General;  Laterality: N/A;  . NISSEN FUNDOPLICATION  2505   [facility] [place]  . WRIST SURGERY     left    Current Outpatient Prescriptions  Medication Sig Dispense Refill  . albuterol (PROVENTIL HFA;VENTOLIN HFA) 108 (90 BASE) MCG/ACT inhaler Inhale into the lungs every 6 (six) hours as needed for wheezing or shortness of  breath.    Marland Kitchen aspirin 81 MG tablet Take 81 mg by mouth daily.    Marland Kitchen atorvastatin (LIPITOR) 10 MG tablet Take 10 mg by mouth daily.    . budesonide-formoterol (SYMBICORT) 160-4.5 MCG/ACT inhaler Inhale 2 puffs into the lungs 2 (two) times daily.    . calcium carbonate (OS-CAL) 600 MG TABS Take 600 mg by mouth 2 (two) times daily with a meal.    . clopidogrel (PLAVIX) 75 MG tablet Take 75 mg by mouth daily.      . colchicine 0.6 MG tablet Take 0.6 mg by mouth daily.    . cycloSPORINE (RESTASIS) 0.05 % ophthalmic emulsion Place 1 drop into both eyes 2 (two) times daily.      Marland Kitchen doxepin (SINEQUAN) 75 MG capsule Take 75 mg by mouth at bedtime.      Marland Kitchen esomeprazole (NEXIUM) 40 MG capsule Take 1 capsule (40 mg total) by mouth 2 (two) times daily before a meal. 60 capsule 5  . folic acid (FOLVITE) 397 MCG tablet Take 800 mcg by mouth daily.     Marland Kitchen glipiZIDE (GLUCOTROL XL) 5 MG 24 hr tablet Take 10 mg by mouth 2 (two) times daily.      . hydrocortisone (ANUSOL-HC) 2.5 % rectal cream Place 1 application rectally 2 (two) times daily. 30 g 0  . hyoscyamine (LEVSIN SL) 0.125 MG SL tablet Place 1 tablet (0.125 mg total) under the tongue every 6 (six) hours as needed for cramping. 120 tablet 0  . insulin detemir (LEVEMIR) 100 UNIT/ML injection Inject 70 Units into the skin daily.     Marland Kitchen lisinopril (PRINIVIL,ZESTRIL) 2.5 MG tablet Take 2.5 mg by mouth daily.    . methotrexate (RHEUMATREX) 2.5 MG tablet Take 7.5 mg by mouth 2 (two) times a week. Caution:Chemotherapy. Protect from light.patient takes on Thursday and Friday Takes 3 tablets on Thursdays and 3 tablets on Fridays    . metoprolol (TOPROL-XL) 100 MG 24 hr tablet Take 50 mg by mouth 2 (two) times daily.      . Nitroglycerin (RECTIV) 0.4 % OINT Place 1 inch rectally 2 (two) times daily. (Patient taking differently: Place 1 inch rectally as needed. ) 30 g 0   No current facility-administered medications for this visit.     Allergies as of 01/24/2016 - Review  Complete 01/24/2016  Allergen Reaction Noted  . Aspirin Nausea Only   . Propoxyphene n-acetaminophen Nausea And Vomiting   . Zithromax [azithromycin] Nausea Only 10/01/2010  . Erythromycin Rash 02/05/2014    Family History  Problem Relation Age of Onset  . Colon cancer Father 1  . Hypertension Sister   . Hypertension Brother   . Kidney disease Brother     Social History   Social History  . Marital status: Widowed    Spouse name: N/A  . Number of children: N/A  . Years of education: N/A   Occupational History  .  disabled    Social History Main Topics  . Smoking status: Former Smoker    Packs/day: 0.25    Years: 19.00    Types: Cigarettes    Quit date: 01/09/2007  . Smokeless tobacco: Never Used  . Alcohol use No  . Drug use: No  . Sexual activity: No   Other Topics Concern  . None   Social History Narrative   1 son-healthy   1 daughter-MVA (drunk-driver)    Review of Systems: General: Negative for anorexia, weight loss, fever, chills, fatigue, weakness. ENT: Negative for hoarseness, difficulty swallowing. CV: Negative for chest pain, angina, palpitations, peripheral edema.  Respiratory: Negative for dyspnea at rest, cough, sputum, wheezing.  GI: See history of present illness. Endo: Negative for unusual weight change.  Heme: Negative for bruising or bleeding.   Physical Exam: BP 127/71   Pulse 67   Temp 98.1 F (36.7 C) (Oral)   Ht 5' 1.5" (1.562 m)   Wt 195 lb 12.8 oz (88.8 kg)   BMI 36.40 kg/m  General:   Obese female. Alert and oriented. Pleasant and cooperative. Well-nourished and well-developed.  Head:  Normocephalic and atraumatic. Eyes:  Without icterus, sclera clear and conjunctiva pink.  Ears:  Normal auditory acuity. Cardiovascular:  S1, S2 present without murmurs appreciated. Extremities without clubbing or edema. Respiratory:  Clear to auscultation bilaterally. No wheezes, rales, or rhonchi. No distress.  Gastrointestinal:  +BS, soft,  and non-distended. Mild discomfort to palpation generalized abdomen, no overt pain. No HSM noted. No guarding or rebound. No masses appreciated.  Rectal:  Deferred  Musculoskalatal:  Symmetrical without gross deformities. Neurologic:  Alert and oriented x4;  grossly normal neurologically. Psych:  Alert and cooperative. Normal mood and affect. Heme/Lymph/Immune: No excessive bruising noted.    01/24/2016 10:35 AM   Disclaimer: This note was dictated with voice recognition software. Similar sounding words can inadvertently be transcribed and may not be corrected upon review.

## 2016-01-24 NOTE — Assessment & Plan Note (Signed)
The patient has had 7-10 watery stools for the past 2-3 days. This is a change from her typical IBS constipation type presentation. Given the fact she is also having nausea and abdominal cramping she likely has some sort of infection. Possible gastroenteritis. I will order stool studies to evaluate for any overt infection and treat as appropriate. If her stool studies are negative she can try Levsin or Bentyl for her diarrhea. I will also check a CBC and CMP for electrolytes and overt infection. Return for follow-up in 4 weeks. She is to call us if she gets worse. Also gave her ER precautions related to weakness, dizziness, inability to tolerate oral intake.

## 2016-01-25 NOTE — Progress Notes (Signed)
cc'ed to pcp °

## 2016-02-17 ENCOUNTER — Other Ambulatory Visit (HOSPITAL_COMMUNITY): Payer: Self-pay | Admitting: Internal Medicine

## 2016-02-17 DIAGNOSIS — Z1231 Encounter for screening mammogram for malignant neoplasm of breast: Secondary | ICD-10-CM

## 2016-02-23 ENCOUNTER — Ambulatory Visit: Payer: Medicare Other | Admitting: Nurse Practitioner

## 2016-03-08 ENCOUNTER — Ambulatory Visit (HOSPITAL_COMMUNITY)
Admission: RE | Admit: 2016-03-08 | Discharge: 2016-03-08 | Disposition: A | Discharge status: Home / Self Care | Payer: Medicare Other | Source: Ambulatory Visit | Attending: Internal Medicine | Admitting: Internal Medicine

## 2016-03-08 DIAGNOSIS — Z1231 Encounter for screening mammogram for malignant neoplasm of breast: Secondary | ICD-10-CM

## 2016-07-12 ENCOUNTER — Encounter (HOSPITAL_COMMUNITY): Payer: Self-pay | Admitting: *Deleted

## 2016-07-12 ENCOUNTER — Emergency Department (HOSPITAL_COMMUNITY)
Admission: EM | Admit: 2016-07-12 | Discharge: 2016-07-13 | Disposition: A | Discharge status: Home / Self Care | Payer: Medicare Other | Attending: Emergency Medicine | Admitting: Emergency Medicine

## 2016-07-12 ENCOUNTER — Emergency Department (HOSPITAL_COMMUNITY): Payer: Medicare Other

## 2016-07-12 DIAGNOSIS — J449 Chronic obstructive pulmonary disease, unspecified: Secondary | ICD-10-CM | POA: Insufficient documentation

## 2016-07-12 DIAGNOSIS — E119 Type 2 diabetes mellitus without complications: Secondary | ICD-10-CM | POA: Insufficient documentation

## 2016-07-12 DIAGNOSIS — J40 Bronchitis, not specified as acute or chronic: Secondary | ICD-10-CM | POA: Insufficient documentation

## 2016-07-12 DIAGNOSIS — Z79899 Other long term (current) drug therapy: Secondary | ICD-10-CM | POA: Diagnosis not present

## 2016-07-12 DIAGNOSIS — R0602 Shortness of breath: Secondary | ICD-10-CM | POA: Diagnosis present

## 2016-07-12 DIAGNOSIS — J45909 Unspecified asthma, uncomplicated: Secondary | ICD-10-CM | POA: Insufficient documentation

## 2016-07-12 DIAGNOSIS — Z794 Long term (current) use of insulin: Secondary | ICD-10-CM | POA: Diagnosis not present

## 2016-07-12 DIAGNOSIS — F17210 Nicotine dependence, cigarettes, uncomplicated: Secondary | ICD-10-CM | POA: Insufficient documentation

## 2016-07-12 DIAGNOSIS — I10 Essential (primary) hypertension: Secondary | ICD-10-CM | POA: Insufficient documentation

## 2016-07-12 HISTORY — DX: Chronic obstructive pulmonary disease, unspecified: J44.9

## 2016-07-12 LAB — CBG MONITORING, ED: CBG: 158

## 2016-07-12 MED ORDER — IPRATROPIUM-ALBUTEROL 0.5-2.5 (3) MG/3ML IN SOLN: 3.0000 mL | Freq: Once | RESPIRATORY_TRACT | Status: DC

## 2016-07-12 MED ORDER — ALBUTEROL SULFATE (2.5 MG/3ML) 0.083% IN NEBU
2.5000 mg | INHALATION_SOLUTION | Freq: Once | RESPIRATORY_TRACT | Status: AC
  Administered 2016-07-12: 2.5 mg via RESPIRATORY_TRACT
  Filled 2016-07-12: qty 3

## 2016-07-12 MED ORDER — IPRATROPIUM-ALBUTEROL 0.5-2.5 (3) MG/3ML IN SOLN
3.0000 mL | Freq: Once | RESPIRATORY_TRACT | Status: AC
  Administered 2016-07-12: 3 mL via RESPIRATORY_TRACT
  Filled 2016-07-12: qty 3

## 2016-07-12 NOTE — ED Provider Notes (Signed)
[facility] DEPT Provider Note   CSN: [id number] Arrival date & time: 07/12/16  2153  By signing my name below, I, Dora Sims, attest that this documentation has been prepared under the direction and in the presence of physician practitioner, Betsey Holiday, Gwenyth Allegra, MD. Electronically Signed: Dora Sims, Scribe. 07/12/2016. 11:08 PM.  History   Chief Complaint Chief Complaint  Patient presents with  . Shortness of Breath   The history is provided by the patient. No language interpreter was used.    HPI Comments: Brittney Martinez is a 61 y.o. female with PMHx including asthma, bronchitis, DM, and COPD who presents to the Emergency Department complaining of a persistent, gradually worsening cough productive of yellow sputum beginning four days ago. Patient reports some associated nasal congestion, wheezing, and SOB secondary to her cough. She has used her albuterol inhaler without significant improvement of her symptoms. She was given a nebulizer treatment here with some improvement of her respiratory symptoms. Patient was evaluated by her PCP earlier today and was prescribed an antibiotic which she has taken as instructed. Pt denies chest pain, leg swelling, hemoptysis, fevers, chills, or any other associated symptoms.  Past Medical History:  Diagnosis Date  . Abdominal aortic stenosis   . Anal fissure   . Anxiety   . Asthma   . Complication of anesthesia   . COPD (chronic obstructive pulmonary disease) ([place])   . Depression    grief  . Diabetes mellitus   . Diverticulitis   . GERD (gastroesophageal reflux disease)   . Glaucoma   . Gout   . Hypertension   . IBS (irritable bowel syndrome)   . PONV (postoperative nausea and vomiting)   . RA (rheumatoid arthritis) ([place])   . Sleep apnea    cpap; patient has old CPAP and not using and is waiting on new one.  Marland Kitchen TIA (transient ischemic attack)    2010. No deficits    Patient Active Problem List   Diagnosis Date Noted  .  Diarrhea 01/24/2016  . Melena 11/01/2014  . Incisional hernia 03/22/2014  . Hemorrhoids 02/05/2014  . Periumbilical hernia 70/17/7939  . Fatty liver 02/05/2014  . Anorectal fissure 11/30/2013  . IBS (irritable bowel syndrome) 09/29/2013  . Heme + stool 09/29/2013  . Rectal pain 09/29/2013  . Proctalgia 01/09/2011  . Obesity 01/09/2011  . Family hx of colon cancer 01/09/2011  . Intertriginous candidiasis 01/09/2011  . GERD 11/15/2009  . Constipation 11/15/2009  . RECTAL BLEEDING 11/15/2009  . DYSPHAGIA UNSPECIFIED 11/15/2009  . EPIGASTRIC PAIN 11/15/2009    Past Surgical History:  Procedure Laterality Date  . ABDOMINAL HYSTERECTOMY  1982   complete  . BREAST LUMPECTOMY     right-benign  . CHOLECYSTECTOMY  2003  . COLONOSCOPY  12/14/09   Dr. Gala Romney :anal papilla and hemorrhoids,diminutive hperplastic rectal polyps/normal colon  . COLONOSCOPY N/A 10/29/2013   [facility]:SPQZ papilla and internal hemorrhoids; colonic polyps-removed as described above. I suspect benign anorectal bleeding in the setting of hemorrhoids and  possibly fissure. tubular adenoma. next TCS 10/2020.  . ELBOW SURGERY     right  . ESOPHAGOGASTRODUODENOSCOPY  12/14/09   Dr. Gala Romney :schatzkis ring 61F, otherwise normal  . ESOPHAGOGASTRODUODENOSCOPY N/A 10/29/2013   RMR: Distal esophageal pseudodiverticulum/Nissen fundoplication  . FINGER SURGERY     on right middle, pointer, and index fingers  . FISSURECTOMY     several  . INCISIONAL HERNIA REPAIR N/A 03/22/2014   Procedure: [name] HERNIORRHAPHY;  Surgeon: Jamesetta So, MD;  Location:  [facility];  Service: General;  Laterality: N/A;  . INSERTION OF MESH N/A 03/22/2014   Procedure: INSERTION OF MESH;  Surgeon: Jamesetta So, MD;  Location: [facility];  Service: General;  Laterality: N/A;  . NISSEN FUNDOPLICATION  0272   [facility] [place]  . WRIST SURGERY     left    OB History    Gravida Para Term Preterm AB Living   '2 2 2     2   '$ SAB TAB Ectopic Multiple Live Births                     Home Medications    Prior to Admission medications   Medication Sig Start Date End Date Taking? Authorizing Provider  albuterol (PROVENTIL HFA;VENTOLIN HFA) 108 (90 BASE) MCG/ACT inhaler Inhale into the lungs every 6 (six) hours as needed for wheezing or shortness of breath.    [provider]  aspirin 81 MG tablet Take 81 mg by mouth daily.    [provider]  atorvastatin (LIPITOR) 10 MG tablet Take 10 mg by mouth daily.    [provider]  budesonide-formoterol (SYMBICORT) 160-4.5 MCG/ACT inhaler Inhale 2 puffs into the lungs 2 (two) times daily.    [provider]  calcium carbonate (OS-CAL) 600 MG TABS Take 600 mg by mouth 2 (two) times daily with a meal.    [provider]  clopidogrel (PLAVIX) 75 MG tablet Take 75 mg by mouth daily.      [provider]  colchicine 0.6 MG tablet Take 0.6 mg by mouth daily.    [provider]  cycloSPORINE (RESTASIS) 0.05 % ophthalmic emulsion Place 1 drop into both eyes 2 (two) times daily.      [provider]  doxepin (SINEQUAN) 75 MG capsule Take 75 mg by mouth at bedtime.      [provider]  esomeprazole (NEXIUM) 40 MG capsule Take 1 capsule (40 mg total) by mouth 2 (two) times daily before a meal. 05/12/14   Mahala Menghini, PA-C  folic acid (FOLVITE) 536 MCG tablet Take 800 mcg by mouth daily.     [provider]  glipiZIDE (GLUCOTROL XL) 5 MG 24 hr tablet Take 10 mg by mouth 2 (two) times daily.      [provider]  hydrocortisone (ANUSOL-HC) 2.5 % rectal cream Place 1 application rectally 2 (two) times daily. 11/01/14   Mahala Menghini, PA-C  hyoscyamine (LEVSIN SL) 0.125 MG SL tablet Place 1 tablet (0.125 mg total) under the tongue every 6 (six) hours as needed for cramping. 11/01/14   Mahala Menghini, PA-C  insulin detemir (LEVEMIR) 100 UNIT/ML injection Inject 70 Units into the skin daily.     [provider]   lisinopril (PRINIVIL,ZESTRIL) 2.5 MG tablet Take 2.5 mg by mouth daily.    [provider]  methotrexate (RHEUMATREX) 2.5 MG tablet Take 7.5 mg by mouth 2 (two) times a week. Caution:Chemotherapy. Protect from light.patient takes on Thursday and Friday Takes 3 tablets on Thursdays and 3 tablets on Fridays    [provider]  metoprolol (TOPROL-XL) 100 MG 24 hr tablet Take 50 mg by mouth 2 (two) times daily.      [provider]  Nitroglycerin (RECTIV) 0.4 % OINT Place 1 inch rectally 2 (two) times daily. Patient taking differently: Place 1 inch rectally as needed.  11/01/14   Mahala Menghini, PA-C  predniSONE (DELTASONE) 20 MG tablet Take 2 tablets (40  mg total) by mouth daily with breakfast. 07/13/16   Samoria Fedorko, Gwenyth Allegra, MD    Family History Family History  Problem Relation Age of Onset  . Colon cancer Father 27  . Hypertension Sister   . Hypertension Brother   . Kidney disease Brother     Social History Social History  Substance Use Topics  . Smoking status: Light Tobacco Smoker    Packs/day: 0.25    Years: 19.00    Types: Cigarettes    Last attempt to quit: 01/09/2007  . Smokeless tobacco: Never Used  . Alcohol use No     Allergies   Aspirin; Propoxyphene n-acetaminophen; Zithromax [azithromycin]; and Erythromycin   Review of Systems Review of Systems  Constitutional: Negative for chills and fever.  HENT: Positive for congestion.   Respiratory: Positive for cough, shortness of breath and wheezing.   Cardiovascular: Negative for chest pain and leg swelling.  All other systems reviewed and are negative.  Physical Exam Updated Vital Signs BP (!) 106/52   Pulse 81   Temp 98.3 F (36.8 C) (Oral)   Resp 20   Ht 5' 2.5" (1.588 m)   Wt 197 lb (89.4 kg)   SpO2 91%   BMI 35.46 kg/m   Physical Exam  Constitutional: She is oriented to person, place, and time. She appears well-developed and well-nourished. No distress.  HENT:  Head:  Normocephalic and atraumatic.  Right Ear: Hearing normal.  Left Ear: Hearing normal.  Nose: Nose normal.  Mouth/Throat: Oropharynx is clear and moist and mucous membranes are normal.  Eyes: Conjunctivae and EOM are normal. Pupils are equal, round, and reactive to light.  Neck: Normal range of motion. Neck supple.  Cardiovascular: Regular rhythm, S1 normal and S2 normal.  Exam reveals no gallop and no friction rub.   No murmur heard. Pulmonary/Chest: Effort normal. No respiratory distress. She has wheezes. She exhibits no tenderness.  Good air movement with wheezing throughout.  Abdominal: Soft. Normal appearance and bowel sounds are normal. There is no hepatosplenomegaly. There is no tenderness. There is no rebound, no guarding, no tenderness at McBurney's point and negative Murphy's sign. No hernia.  Musculoskeletal: Normal range of motion.  Neurological: She is alert and oriented to person, place, and time. She has normal strength. No cranial nerve deficit or sensory deficit. Coordination normal. GCS eye subscore is 4. GCS verbal subscore is 5. GCS motor subscore is 6.  Skin: Skin is warm, dry and intact. No rash noted. No cyanosis.  Psychiatric: She has a normal mood and affect. Her speech is normal and behavior is normal. Thought content normal.  Nursing note and vitals reviewed.  ED Treatments / Results  Labs (all labs ordered are listed, but only abnormal results are displayed) Labs Reviewed  CBG MONITORING, ED    EKG  EKG Interpretation None       Radiology Dg Chest 2 View  Result Date: 07/12/2016 CLINICAL DATA:  Shortness of breath, productive cough, and wheezing since last night. History of diabetes, hypertension, asthma, gastroesophageal reflux, TIA, COPD, breast lumpectomy. EXAM: CHEST  2 VIEW COMPARISON:  05/19/2011 FINDINGS: Normal heart size and pulmonary vascularity. Peribronchial thickening with central interstitial pattern suggesting bronchitic changes. These could  be either acute or chronic. No focal consolidation or airspace disease in the lungs. No blunting of costophrenic angles. No pneumothorax. Calcified and tortuous aorta. Degenerative changes in the spine. Surgical clips in the right upper quadrant. IMPRESSION: Bronchitic changes in the lungs. No focal consolidation. Normal heart size.  Electronically Signed   By: Lucienne Capers M.D.   On: 07/12/2016 22:36    Procedures Procedures (including critical care time)  DIAGNOSTIC STUDIES: Oxygen Saturation is 95% on RA, adequate by my interpretation.    COORDINATION OF CARE: 11:12 PM Discussed treatment plan with pt at bedside and pt agreed to plan.  Medications Ordered in ED Medications  ipratropium-albuterol (DUONEB) 0.5-2.5 (3) MG/3ML nebulizer solution 3 mL (3 mLs Nebulization Given 07/12/16 2235)  ipratropium-albuterol (DUONEB) 0.5-2.5 (3) MG/3ML nebulizer solution 3 mL (3 mLs Nebulization Given 07/12/16 2319)  albuterol (PROVENTIL) (2.5 MG/3ML) 0.083% nebulizer solution 2.5 mg (2.5 mg Nebulization Given 07/12/16 2319)     Initial Impression / Assessment and Plan / ED Course  I have reviewed the triage vital signs and the nursing notes.  Pertinent labs & imaging results that were available during my care of the patient were reviewed by me and considered in my medical decision making (see chart for details).     Patient presents with complaints of cough and shortness of breath. She saw her doctor earlier today, was given a prescription for cough medicine and antibiotic. She was also given a shot of steroids. She reports that she has had increased coughing and feeling more short of breath this evening. She did have diffuse bronchospasm which has significantly improved with nebulizer treatments. Chest x-ray does not show evidence of pneumonia. Patient is feeling improved and is not hypoxic or tachypnea. She will be discharged, given a limited prescription for prednisone, blood glucose was 158 here  in the ER. Continue bronchodilator therapy. She was given a prescription for a nebulizer machine and albuterol solution by her doctor to be picked up tomorrow.  Final Clinical Impressions(s) / ED Diagnoses   Final diagnoses:  Bronchitis    New Prescriptions New Prescriptions   PREDNISONE (DELTASONE) 20 MG TABLET    Take 2 tablets (40 mg total) by mouth daily with breakfast.  I personally performed the services described in this documentation, which was scribed in my presence. The recorded information has been reviewed and is accurate.    Orpah Greek, MD 07/13/16 0100

## 2016-07-12 NOTE — ED Notes (Signed)
CBG 158  

## 2016-07-12 NOTE — ED Triage Notes (Signed)
SOB  Since last night, productive cough of yellow phlegm per pt,  Seen by PCP earlier today.

## 2016-07-13 LAB — CBG MONITORING, ED: Glucose-Capillary: 158 mg/dL — ABNORMAL HIGH (ref 65–99)

## 2016-07-13 MED ORDER — PREDNISONE 20 MG PO TABS: 40.0000 mg | ORAL_TABLET | Freq: Every day | ORAL | 0 refills | Status: DC

## 2016-07-13 NOTE — ED Notes (Signed)
Pt alert & oriented x4, stable gait. Patient given discharge instructions, paperwork & prescription(s). Patient  instructed to stop at the registration desk to finish any additional paperwork. Patient verbalized understanding. Pt left department w/ no further questions. 

## 2016-10-15 NOTE — Congregational Nurse Program (Signed)
Congregational Nurse Program Note  Date of Encounter: 10/11/2016  Past Medical History: Past Medical History:  Diagnosis Date  . Abdominal aortic stenosis   . Anal fissure   . Anxiety   . Asthma   . Complication of anesthesia   . COPD (chronic obstructive pulmonary disease) ([place])   . Depression    grief  . Diabetes mellitus   . Diverticulitis   . GERD (gastroesophageal reflux disease)   . Glaucoma   . Gout   . Hypertension   . IBS (irritable bowel syndrome)   . PONV (postoperative nausea and vomiting)   . RA (rheumatoid arthritis) ([place])   . Sleep apnea    cpap; patient has old CPAP and not using and is waiting on new one.  Marland Kitchen TIA (transient ischemic attack)    2010. No deficits    Encounter Details:     CNP Questionnaire - 10/11/16 0900      Patient Demographics   Is this a new or existing patient? New   Patient is considered a/an Not Applicable   Race African-American/Black     Patient Assistance   Location of Patient Assistance [employer], [place]   Patient's financial/insurance status Low Income;Medicaid;Medicare   Uninsured Patient ([facility]) No   Patient referred to apply for the following financial assistance Not Applicable   Food insecurities addressed Referred to food bank or resource   Transportation assistance No   Assistance securing medications No   Educational health offerings Chronic disease;Navigating the healthcare system     Encounter Details   Primary purpose of visit Chronic Illness/Condition Visit   Was an Emergency Department visit averted? Not Applicable   Does patient have a medical provider? Yes   Patient referred to Follow up with established PCP   Was a mental health screening completed? (GAINS tool) No   Does patient have dental issues? No   Does patient have vision issues? No   Does your patient have an abnormal blood pressure today? No   Since previous encounter, have you referred patient for abnormal blood  pressure that resulted in a new diagnosis or medication change? No   Does your patient have an abnormal blood glucose today? No   Since previous encounter, have you referred patient for abnormal blood glucose that resulted in a new diagnosis or medication change? No   Was there a life-saving intervention made? No      New client encountered during a blood pressure screening event. Client states she has a medical provider Dr. Woody Seller. She states she is taking her medications as prescribed. Client states understanding.

## 2017-05-01 NOTE — Patient Instructions (Signed)
Your procedure is scheduled on: 05/13/2017   Report to [facility] at   47    AM.  Call this number if you have problems the morning of surgery: [phone]   Do not eat food or drink liquids :After Midnight.      Take these medicines the morning of surgery with A SIP OF WATER: nexium, lisinopril, metoprolol. Take your inhalers and your nebulizer before you come. Take 20 units of Levemer the night before your surgery and NONE the morning of your procedure.   Do not wear jewelry, make-up or nail polish.  Do not wear lotions, powders, or perfumes. You may wear deodorant.  Do not shave 48 hours prior to surgery.  Do not bring valuables to the hospital.  Contacts, dentures or bridgework may not be worn into surgery.  Leave suitcase in the car. After surgery it may be brought to your room.  For patients admitted to the hospital, checkout time is 11:00 AM the day of discharge.   Patients discharged the day of surgery will not be allowed to drive home.  :     Please read over the following fact sheets that you were given: Coughing and Deep Breathing, Surgical Site Infection Prevention, Anesthesia Post-op Instructions and Care and Recovery After Surgery    Cataract A cataract is a clouding of the lens of the eye. When a lens becomes cloudy, vision is reduced based on the degree and nature of the clouding. Many cataracts reduce vision to some degree. Some cataracts make people more near-sighted as they develop. Other cataracts increase glare. Cataracts that are ignored and become worse can sometimes look white. The white color can be seen through the pupil. CAUSES   Aging. However, cataracts may occur at any age, even in newborns.   Certain drugs.   Trauma to the eye.   Certain diseases such as diabetes.   Specific eye diseases such as chronic inflammation inside the eye or a sudden attack of a rare form of glaucoma.   Inherited or acquired medical problems.  SYMPTOMS   Gradual,  progressive drop in vision in the affected eye.   Severe, rapid visual loss. This most often happens when trauma is the cause.  DIAGNOSIS  To detect a cataract, an eye doctor examines the lens. Cataracts are best diagnosed with an exam of the eyes with the pupils enlarged (dilated) by drops.  TREATMENT  For an early cataract, vision may improve by using different eyeglasses or stronger lighting. If that does not help your vision, surgery is the only effective treatment. A cataract needs to be surgically removed when vision loss interferes with your everyday activities, such as driving, reading, or watching TV. A cataract may also have to be removed if it prevents examination or treatment of another eye problem. Surgery removes the cloudy lens and usually replaces it with a substitute lens (intraocular lens, IOL).  At a time when both you and your doctor agree, the cataract will be surgically removed. If you have cataracts in both eyes, only one is usually removed at a time. This allows the operated eye to heal and be out of danger from any possible problems after surgery (such as infection or poor wound healing). In rare cases, a cataract may be doing damage to your eye. In these cases, your caregiver may advise surgical removal right away. The vast majority of people who have cataract surgery have better vision afterward. HOME CARE INSTRUCTIONS  If you are not planning  surgery, you may be asked to do the following:  Use different eyeglasses.   Use stronger or brighter lighting.   Ask your eye doctor about reducing your medicine dose or changing medicines if it is thought that a medicine caused your cataract. Changing medicines does not make the cataract go away on its own.   Become familiar with your surroundings. Poor vision can lead to injury. Avoid bumping into things on the affected side. You are at a higher risk for tripping or falling.   Exercise extreme care when driving or operating  machinery.   Wear sunglasses if you are sensitive to bright light or experiencing problems with glare.  SEEK IMMEDIATE MEDICAL CARE IF:   You have a worsening or sudden vision loss.   You notice redness, swelling, or increasing pain in the eye.   You have a fever.  Document Released: 02/19/2005 Document Revised: 02/08/2011 Document Reviewed: 10/13/2010 [facility] Patient Information 2012 [place].PATIENT INSTRUCTIONS POST-ANESTHESIA  IMMEDIATELY FOLLOWING SURGERY:  Do not drive or operate machinery for the first twenty four hours after surgery.  Do not make any important decisions for twenty four hours after surgery or while taking narcotic pain medications or sedatives.  If you develop intractable nausea and vomiting or a severe headache please notify your doctor immediately.  FOLLOW-UP:  Please make an appointment with your surgeon as instructed. You do not need to follow up with anesthesia unless specifically instructed to do so.  WOUND CARE INSTRUCTIONS (if applicable):  Keep a dry clean dressing on the anesthesia/puncture wound site if there is drainage.  Once the wound has quit draining you may leave it open to air.  Generally you should leave the bandage intact for twenty four hours unless there is drainage.  If the epidural site drains for more than 36-48 hours please call the anesthesia department.  QUESTIONS?:  Please feel free to call your physician or the hospital operator if you have any questions, and they will be happy to assist you.

## 2017-05-06 ENCOUNTER — Encounter (HOSPITAL_COMMUNITY): Payer: Self-pay

## 2017-05-06 ENCOUNTER — Encounter (HOSPITAL_COMMUNITY)
Admission: RE | Admit: 2017-05-06 | Discharge: 2017-05-06 | Disposition: A | Discharge status: Home / Self Care | Payer: Medicare Other | Source: Ambulatory Visit | Attending: Ophthalmology | Admitting: Ophthalmology

## 2017-05-06 ENCOUNTER — Other Ambulatory Visit: Payer: Self-pay

## 2017-05-06 DIAGNOSIS — Z01818 Encounter for other preprocedural examination: Secondary | ICD-10-CM | POA: Diagnosis present

## 2017-05-06 DIAGNOSIS — Z01810 Encounter for preprocedural cardiovascular examination: Secondary | ICD-10-CM | POA: Insufficient documentation

## 2017-05-06 DIAGNOSIS — Z01812 Encounter for preprocedural laboratory examination: Secondary | ICD-10-CM | POA: Insufficient documentation

## 2017-05-06 HISTORY — DX: Cerebral infarction, unspecified: I63.9

## 2017-05-06 LAB — CBC WITH DIFFERENTIAL/PLATELET
Basophils Absolute: 0 10*3/uL (ref 0.0–0.1)
Basophils Relative: 1 %
Eosinophils Absolute: 0.1 10*3/uL (ref 0.0–0.7)
Eosinophils Relative: 3 %
HCT: 42 % (ref 36.0–46.0)
Hemoglobin: 13.3 g/dL (ref 12.0–15.0)
Lymphocytes Relative: 42 %
Lymphs Abs: 2.2 10*3/uL (ref 0.7–4.0)
MCH: 30.3 pg (ref 26.0–34.0)
MCHC: 31.7 g/dL (ref 30.0–36.0)
MCV: 95.7 fL (ref 78.0–100.0)
Monocytes Absolute: 0.3 10*3/uL (ref 0.1–1.0)
Monocytes Relative: 6 %
Neutro Abs: 2.5 10*3/uL (ref 1.7–7.7)
Neutrophils Relative %: 48 %
Platelets: 243 10*3/uL (ref 150–400)
RBC: 4.39 MIL/uL (ref 3.87–5.11)
RDW: 15.1 % (ref 11.5–15.5)
WBC: 5.3 10*3/uL (ref 4.0–10.5)

## 2017-05-06 LAB — BASIC METABOLIC PANEL
Anion gap: 11 (ref 5–15)
BUN: 8 mg/dL (ref 6–20)
CO2: 24 mmol/L (ref 22–32)
Calcium: 9.2 mg/dL (ref 8.9–10.3)
Chloride: 103 mmol/L (ref 101–111)
Creatinine, Ser: 0.71 mg/dL (ref 0.44–1.00)
GFR calc Af Amer: >60 mL/min (ref >60)
GFR calc non Af Amer: >60 mL/min (ref >60)
Glucose, Bld: 118 mg/dL — ABNORMAL HIGH (ref 65–99)
Potassium: 3.5 mmol/L (ref 3.5–5.1)
Sodium: 138 mmol/L (ref 135–145)

## 2017-05-06 LAB — HEMOGLOBIN A1C
Hgb A1c MFr Bld: 8 % — ABNORMAL HIGH (ref 4.8–5.6)
Mean Plasma Glucose: 182.9 mg/dL

## 2017-05-06 LAB — GLUCOSE, CAPILLARY: Glucose-Capillary: 104 mg/dL — ABNORMAL HIGH (ref 65–99)

## 2017-05-07 NOTE — Pre-Procedure Instructions (Signed)
HemA1C routed to PCP.

## 2017-05-10 MED ORDER — NEOMYCIN-POLYMYXIN-DEXAMETH 3.5-10000-0.1 OP SUSP
OPHTHALMIC | Status: AC
  Filled 2017-05-10: qty 5

## 2017-05-10 MED ORDER — PHENYLEPHRINE HCL 2.5 % OP SOLN
OPHTHALMIC | Status: AC
  Filled 2017-05-10: qty 15

## 2017-05-10 MED ORDER — LIDOCAINE HCL (PF) 1 % IJ SOLN
INTRAMUSCULAR | Status: AC
  Filled 2017-05-10: qty 2

## 2017-05-10 MED ORDER — TETRACAINE HCL 0.5 % OP SOLN
OPHTHALMIC | Status: AC
  Filled 2017-05-10: qty 4

## 2017-05-10 MED ORDER — CYCLOPENTOLATE-PHENYLEPHRINE 0.2-1 % OP SOLN
OPHTHALMIC | Status: AC
Start: 2017-05-10 — End: ?
  Filled 2017-05-10: qty 2

## 2017-05-10 MED ORDER — LIDOCAINE HCL 3.5 % OP GEL
OPHTHALMIC | Status: AC
  Filled 2017-05-10: qty 1

## 2017-05-13 ENCOUNTER — Ambulatory Visit (HOSPITAL_COMMUNITY): Payer: Medicare Other | Admitting: Anesthesiology

## 2017-05-13 ENCOUNTER — Ambulatory Visit (HOSPITAL_COMMUNITY)
Admission: RE | Admit: 2017-05-13 | Discharge: 2017-05-13 | Disposition: A | Discharge status: Home / Self Care | Payer: Medicare Other | Source: Ambulatory Visit | Attending: Ophthalmology | Admitting: Ophthalmology

## 2017-05-13 ENCOUNTER — Encounter (HOSPITAL_COMMUNITY)
Admission: RE | Disposition: A | Discharge status: Home / Self Care | Payer: Self-pay | Source: Ambulatory Visit | Attending: Ophthalmology

## 2017-05-13 ENCOUNTER — Encounter (HOSPITAL_COMMUNITY): Payer: Self-pay

## 2017-05-13 DIAGNOSIS — H25811 Combined forms of age-related cataract, right eye: Secondary | ICD-10-CM | POA: Diagnosis present

## 2017-05-13 DIAGNOSIS — F17200 Nicotine dependence, unspecified, uncomplicated: Secondary | ICD-10-CM | POA: Diagnosis not present

## 2017-05-13 DIAGNOSIS — J449 Chronic obstructive pulmonary disease, unspecified: Secondary | ICD-10-CM | POA: Insufficient documentation

## 2017-05-13 DIAGNOSIS — G4730 Sleep apnea, unspecified: Secondary | ICD-10-CM | POA: Diagnosis not present

## 2017-05-13 DIAGNOSIS — E1136 Type 2 diabetes mellitus with diabetic cataract: Secondary | ICD-10-CM | POA: Insufficient documentation

## 2017-05-13 DIAGNOSIS — E1151 Type 2 diabetes mellitus with diabetic peripheral angiopathy without gangrene: Secondary | ICD-10-CM | POA: Diagnosis not present

## 2017-05-13 DIAGNOSIS — Z886 Allergy status to analgesic agent status: Secondary | ICD-10-CM | POA: Insufficient documentation

## 2017-05-13 DIAGNOSIS — M069 Rheumatoid arthritis, unspecified: Secondary | ICD-10-CM | POA: Diagnosis not present

## 2017-05-13 DIAGNOSIS — I10 Essential (primary) hypertension: Secondary | ICD-10-CM | POA: Insufficient documentation

## 2017-05-13 HISTORY — PX: CATARACT EXTRACTION W/PHACO: SHX586

## 2017-05-13 LAB — GLUCOSE, CAPILLARY: Glucose-Capillary: 106 mg/dL — ABNORMAL HIGH (ref 65–99)

## 2017-05-13 SURGERY — PHACOEMULSIFICATION, CATARACT, WITH IOL INSERTION
Anesthesia: Monitor Anesthesia Care | Site: Eye | Laterality: Right

## 2017-05-13 MED ORDER — LACTATED RINGERS IV SOLN
INTRAVENOUS | Status: DC
  Administered 2017-05-13: 07:00:00 via INTRAVENOUS

## 2017-05-13 MED ORDER — EPINEPHRINE PF 1 MG/ML IJ SOLN
INTRAOCULAR | Status: DC | PRN
  Administered 2017-05-13: 500 mL

## 2017-05-13 MED ORDER — LIDOCAINE HCL (PF) 1 % IJ SOLN
INTRAMUSCULAR | Status: DC | PRN
  Administered 2017-05-13: .6 mL

## 2017-05-13 MED ORDER — NEOMYCIN-POLYMYXIN-DEXAMETH 3.5-10000-0.1 OP SUSP
OPHTHALMIC | Status: DC | PRN
  Administered 2017-05-13: 2 [drp] via OPHTHALMIC

## 2017-05-13 MED ORDER — TETRACAINE HCL 0.5 % OP SOLN
1.0000 [drp] | OPHTHALMIC | Status: AC
  Administered 2017-05-13 (×3): 1 [drp] via OPHTHALMIC

## 2017-05-13 MED ORDER — PROVISC 10 MG/ML IO SOLN
INTRAOCULAR | Status: DC | PRN
  Administered 2017-05-13: 0.85 mL via INTRAOCULAR

## 2017-05-13 MED ORDER — MIDAZOLAM HCL 2 MG/2ML IJ SOLN
INTRAMUSCULAR | Status: AC
  Filled 2017-05-13: qty 2

## 2017-05-13 MED ORDER — CYCLOPENTOLATE-PHENYLEPHRINE 0.2-1 % OP SOLN
1.0000 [drp] | OPHTHALMIC | Status: AC
  Administered 2017-05-13 (×3): 1 [drp] via OPHTHALMIC

## 2017-05-13 MED ORDER — FENTANYL CITRATE (PF) 100 MCG/2ML IJ SOLN
25.0000 ug | Freq: Once | INTRAMUSCULAR | Status: AC
  Administered 2017-05-13: 25 ug via INTRAVENOUS

## 2017-05-13 MED ORDER — MIDAZOLAM HCL 2 MG/2ML IJ SOLN
1.0000 mg | INTRAMUSCULAR | Status: AC
  Administered 2017-05-13: 2 mg via INTRAVENOUS

## 2017-05-13 MED ORDER — BSS IO SOLN
INTRAOCULAR | Status: DC | PRN
  Administered 2017-05-13: 15 mL

## 2017-05-13 MED ORDER — PHENYLEPHRINE HCL 2.5 % OP SOLN
1.0000 [drp] | OPHTHALMIC | Status: AC
  Administered 2017-05-13 (×3): 1 [drp] via OPHTHALMIC

## 2017-05-13 MED ORDER — FENTANYL CITRATE (PF) 100 MCG/2ML IJ SOLN
INTRAMUSCULAR | Status: AC
  Filled 2017-05-13: qty 2

## 2017-05-13 MED ORDER — POVIDONE-IODINE 5 % OP SOLN
OPHTHALMIC | Status: DC | PRN
  Administered 2017-05-13: 1 via OPHTHALMIC

## 2017-05-13 SURGICAL SUPPLY — 12 items
CLOTH BEACON ORANGE TIMEOUT ST (SAFETY) ×1 IMPLANT
EYE SHIELD UNIVERSAL CLEAR (GAUZE/BANDAGES/DRESSINGS) ×1 IMPLANT
GLOVE BIOGEL PI IND STRL 6.5 (GLOVE) IMPLANT
GLOVE BIOGEL PI IND STRL 7.0 (GLOVE) IMPLANT
GLOVE BIOGEL PI INDICATOR 6.5 (GLOVE) ×1
GLOVE BIOGEL PI INDICATOR 7.0 (GLOVE) ×1
LENS ALC ACRYL/TECN (Ophthalmic Related) ×1 IMPLANT
PAD ARMBOARD 7.5X6 YLW CONV (MISCELLANEOUS) ×1 IMPLANT
SYRINGE LUER LOK 1CC (MISCELLANEOUS) ×1 IMPLANT
TAPE SURG TRANSPORE 1 IN (GAUZE/BANDAGES/DRESSINGS) IMPLANT
TAPE SURGICAL TRANSPORE 1 IN (GAUZE/BANDAGES/DRESSINGS) ×2
WATER STERILE IRR 250ML POUR (IV SOLUTION) ×1 IMPLANT

## 2017-05-13 NOTE — Anesthesia Procedure Notes (Signed)
Procedure Name: MAC Date/Time: 05/13/2017 7:24 AM Performed by: Vista Deck, CRNA Pre-anesthesia Checklist: Patient identified, Emergency Drugs available, Suction available, Timeout performed and Patient being monitored Patient Re-evaluated:Patient Re-evaluated prior to induction Oxygen Delivery Method: Nasal Cannula

## 2017-05-13 NOTE — Anesthesia Preprocedure Evaluation (Addendum)
Anesthesia Evaluation  Patient identified by MRN, date of birth, ID band Patient awake    Reviewed: Allergy & Precautions, NPO status , Patient's Chart, lab work & pertinent test results  History of Anesthesia Complications (+) PONV and history of anesthetic complications  Airway Mallampati: III  TM Distance: >3 FB   Mouth opening: Limited Mouth Opening  Dental  (+) Edentulous Upper, Edentulous Lower   Pulmonary asthma , sleep apnea and Continuous Positive Airway Pressure Ventilation , COPD, Current Smoker,    breath sounds clear to auscultation       Cardiovascular hypertension, Pt. on medications + Peripheral Vascular Disease   Rhythm:Regular Rate:Normal     Neuro/Psych PSYCHIATRIC DISORDERS Anxiety Depression TIACVA    GI/Hepatic GERD  ,  Endo/Other  diabetes, Type 2  Renal/GU      Musculoskeletal  (+) Arthritis , Rheumatoid disorders,    Abdominal   Peds  Hematology   Anesthesia Other Findings   Reproductive/Obstetrics                             Anesthesia Physical Anesthesia Plan  ASA: III  Anesthesia Plan: MAC   Post-op Pain Management:    Induction: Intravenous  PONV Risk Score and Plan:   Airway Management Planned: Nasal Cannula  Additional Equipment:   Intra-op Plan:   Post-operative Plan:   Informed Consent: I have reviewed the patients History and Physical, chart, labs and discussed the procedure including the risks, benefits and alternatives for the proposed anesthesia with the patient or authorized representative who has indicated his/her understanding and acceptance.     Plan Discussed with:   Anesthesia Plan Comments:         Anesthesia Quick Evaluation  

## 2017-05-13 NOTE — Transfer of Care (Signed)
Immediate Anesthesia Transfer of Care Note  Patient: Brittney Martinez  Procedure(s) Performed: CATARACT EXTRACTION PHACO AND INTRAOCULAR LENS PLACEMENT (IOC) (Right Eye)  Patient Location: Short Stay  Anesthesia Type:MAC  Level of Consciousness: awake, alert  and patient cooperative  Airway & Oxygen Therapy: Patient Spontanous Breathing  Post-op Assessment: Report given to RN and Post -op Vital signs reviewed and stable  Post vital signs: Reviewed and stable  Last Vitals:  Vitals:   05/13/17 0715 05/13/17 0720  BP: 117/75 121/73  Pulse:    Resp: (!) 22 12  Temp:    SpO2: 92% 96%    Last Pain:  Vitals:   05/13/17 0633  TempSrc: Oral      Patients Stated Pain Goal: 6 (45/62/56 [id number])  Complications: No apparent anesthesia complications

## 2017-05-13 NOTE — Discharge Instructions (Signed)

## 2017-05-13 NOTE — Op Note (Signed)
Date of Admission: 05/13/2017  Date of Surgery: 05/13/2017  Pre-Op Dx: Cataract Right  Eye  Post-Op Dx: Senile Combined Cataract  Right  Eye,  Dx Code U76.546  Surgeon: Tonny Branch, M.D.  Assistants: None  Anesthesia: Topical with MAC  Indications: Painless, progressive loss of vision with compromise of daily activities.  Surgery: Cataract Extraction with Intraocular lens Implant Right Eye  Discription: The patient had dilating drops and viscous lidocaine placed into the Right eye in the pre-op holding area. After transfer to the operating room, a time out was performed. The patient was then prepped and draped. Beginning with a 4m blade a paracentesis port was made at the surgeon's 2 o'clock position. The anterior chamber was then filled with 1% non-preserved lidocaine. This was followed by filling the anterior chamber with Provisc.  A 2.481mkeratome blade was used to make a clear corneal incision at the temporal limbus.  A bent cystatome needle was used to create a continuous tear capsulotomy. Hydrodissection was performed with balanced salt solution on a Fine canula. The lens nucleus was then removed using the phacoemulsification handpiece. Residual cortex was removed with the I&A handpiece. The anterior chamber and capsular bag were refilled with Provisc. A posterior chamber intraocular lens was placed into the capsular bag with it's injector. The implant was positioned with the Kuglan hook. The Provisc was then removed from the anterior chamber and capsular bag with the I&A handpiece. Stromal hydration of the main incision and paracentesis port was performed with BSS on a Fine canula. The wounds were tested for leak which was negative. The patient tolerated the procedure well. There were no operative complications. The patient was then transferred to the recovery room in stable condition.  Complications: None  Specimen: None  EBL: None  Prosthetic device: J&J Technis, PCB00, power 22.0,  SN [id number]

## 2017-05-13 NOTE — H&P (Signed)
I have reviewed the H&P, the patient was re-examined, and I have identified no interval changes in medical condition and plan of care since the history and physical of record  

## 2017-05-13 NOTE — Anesthesia Postprocedure Evaluation (Signed)
Anesthesia Post Note  Patient: Brittney Martinez  Procedure(s) Performed: CATARACT EXTRACTION PHACO AND INTRAOCULAR LENS PLACEMENT (IOC) (Right Eye)  Patient location during evaluation: Short Stay Anesthesia Type: MAC Level of consciousness: awake and alert and patient cooperative Pain management: pain level controlled Vital Signs Assessment: post-procedure vital signs reviewed and stable Respiratory status: spontaneous breathing Cardiovascular status: stable Postop Assessment: no apparent nausea or vomiting Anesthetic complications: no     Last Vitals:  Vitals:   05/13/17 0720 05/13/17 0744  BP: 121/73 129/60  Pulse:  66  Resp: 12 16  Temp:  (!) 36.4 C  SpO2: 96% 94%    Last Pain:  Vitals:   05/13/17 0744  TempSrc: Oral                 Roe Wilner

## 2017-05-14 ENCOUNTER — Encounter (HOSPITAL_COMMUNITY): Payer: Self-pay | Admitting: Ophthalmology

## 2017-05-23 ENCOUNTER — Encounter (HOSPITAL_COMMUNITY)
Admission: RE | Admit: 2017-05-23 | Discharge: 2017-05-23 | Disposition: A | Discharge status: Home / Self Care | Payer: Medicare Other | Source: Ambulatory Visit | Attending: Ophthalmology | Admitting: Ophthalmology

## 2017-05-23 ENCOUNTER — Encounter (HOSPITAL_COMMUNITY): Payer: Self-pay

## 2017-05-27 ENCOUNTER — Ambulatory Visit (HOSPITAL_COMMUNITY)
Admission: RE | Admit: 2017-05-27 | Discharge: 2017-05-27 | Disposition: A | Discharge status: Home / Self Care | Payer: Medicare Other | Source: Ambulatory Visit | Attending: Ophthalmology | Admitting: Ophthalmology

## 2017-05-27 ENCOUNTER — Ambulatory Visit (HOSPITAL_COMMUNITY): Payer: Medicare Other | Admitting: Anesthesiology

## 2017-05-27 ENCOUNTER — Encounter (HOSPITAL_COMMUNITY): Payer: Self-pay

## 2017-05-27 ENCOUNTER — Encounter (HOSPITAL_COMMUNITY)
Admission: RE | Disposition: A | Discharge status: Home / Self Care | Payer: Self-pay | Source: Ambulatory Visit | Attending: Ophthalmology

## 2017-05-27 DIAGNOSIS — H25812 Combined forms of age-related cataract, left eye: Secondary | ICD-10-CM | POA: Diagnosis present

## 2017-05-27 DIAGNOSIS — J449 Chronic obstructive pulmonary disease, unspecified: Secondary | ICD-10-CM | POA: Diagnosis not present

## 2017-05-27 DIAGNOSIS — G4730 Sleep apnea, unspecified: Secondary | ICD-10-CM | POA: Insufficient documentation

## 2017-05-27 DIAGNOSIS — F17200 Nicotine dependence, unspecified, uncomplicated: Secondary | ICD-10-CM | POA: Insufficient documentation

## 2017-05-27 DIAGNOSIS — E1136 Type 2 diabetes mellitus with diabetic cataract: Secondary | ICD-10-CM | POA: Insufficient documentation

## 2017-05-27 DIAGNOSIS — E1151 Type 2 diabetes mellitus with diabetic peripheral angiopathy without gangrene: Secondary | ICD-10-CM | POA: Diagnosis not present

## 2017-05-27 DIAGNOSIS — Z79899 Other long term (current) drug therapy: Secondary | ICD-10-CM | POA: Insufficient documentation

## 2017-05-27 DIAGNOSIS — I10 Essential (primary) hypertension: Secondary | ICD-10-CM | POA: Diagnosis not present

## 2017-05-27 HISTORY — PX: CATARACT EXTRACTION W/PHACO: SHX586

## 2017-05-27 LAB — GLUCOSE, CAPILLARY: Glucose-Capillary: 76 mg/dL (ref 65–99)

## 2017-05-27 SURGERY — PHACOEMULSIFICATION, CATARACT, WITH IOL INSERTION
Anesthesia: Monitor Anesthesia Care | Site: Eye | Laterality: Left

## 2017-05-27 MED ORDER — EPINEPHRINE PF 1 MG/ML IJ SOLN
INTRAMUSCULAR | Status: DC | PRN
  Administered 2017-05-27: 500 mL

## 2017-05-27 MED ORDER — PHENYLEPHRINE HCL 2.5 % OP SOLN
1.0000 [drp] | OPHTHALMIC | Status: AC
  Administered 2017-05-27 (×3): 1 [drp] via OPHTHALMIC

## 2017-05-27 MED ORDER — TETRACAINE HCL 0.5 % OP SOLN
1.0000 [drp] | OPHTHALMIC | Status: AC
  Administered 2017-05-27 (×3): 1 [drp] via OPHTHALMIC

## 2017-05-27 MED ORDER — LIDOCAINE HCL 3.5 % OP GEL
1.0000 "application " | Freq: Once | OPHTHALMIC | Status: AC
  Administered 2017-05-27: 1 via OPHTHALMIC

## 2017-05-27 MED ORDER — MIDAZOLAM HCL 2 MG/2ML IJ SOLN
1.0000 mg | INTRAMUSCULAR | Status: AC
  Administered 2017-05-27: 2 mg via INTRAVENOUS
  Filled 2017-05-27: qty 2

## 2017-05-27 MED ORDER — LIDOCAINE HCL (PF) 1 % IJ SOLN
INTRAOCULAR | Status: DC | PRN
  Administered 2017-05-27: .9 mL via OPHTHALMIC

## 2017-05-27 MED ORDER — BSS IO SOLN
INTRAOCULAR | Status: DC | PRN
  Administered 2017-05-27: 15 mL

## 2017-05-27 MED ORDER — PROVISC 10 MG/ML IO SOLN
INTRAOCULAR | Status: DC | PRN
  Administered 2017-05-27: 0.85 mL via INTRAOCULAR

## 2017-05-27 MED ORDER — FENTANYL CITRATE (PF) 100 MCG/2ML IJ SOLN
25.0000 ug | Freq: Once | INTRAMUSCULAR | Status: AC
  Administered 2017-05-27: 25 ug via INTRAVENOUS
  Filled 2017-05-27: qty 2

## 2017-05-27 MED ORDER — NEOMYCIN-POLYMYXIN-DEXAMETH 3.5-10000-0.1 OP SUSP
OPHTHALMIC | Status: DC | PRN
  Administered 2017-05-27: 2 [drp] via OPHTHALMIC

## 2017-05-27 MED ORDER — POVIDONE-IODINE 5 % OP SOLN
OPHTHALMIC | Status: DC | PRN
  Administered 2017-05-27: 1 via OPHTHALMIC

## 2017-05-27 MED ORDER — LACTATED RINGERS IV SOLN
INTRAVENOUS | Status: DC
  Administered 2017-05-27: 12:00:00 via INTRAVENOUS

## 2017-05-27 MED ORDER — CYCLOPENTOLATE-PHENYLEPHRINE 0.2-1 % OP SOLN
1.0000 [drp] | OPHTHALMIC | Status: AC
  Administered 2017-05-27 (×3): 1 [drp] via OPHTHALMIC

## 2017-05-27 SURGICAL SUPPLY — 10 items
CLOTH BEACON ORANGE TIMEOUT ST (SAFETY) ×1 IMPLANT
EYE SHIELD UNIVERSAL CLEAR (GAUZE/BANDAGES/DRESSINGS) ×1 IMPLANT
GLOVE BIOGEL PI IND STRL 7.0 (GLOVE) IMPLANT
GLOVE BIOGEL PI INDICATOR 7.0 (GLOVE) ×2
LENS ALC ACRYL/TECN (Ophthalmic Related) ×1 IMPLANT
PAD ARMBOARD 7.5X6 YLW CONV (MISCELLANEOUS) ×1 IMPLANT
SYRINGE LUER LOK 1CC (MISCELLANEOUS) ×1 IMPLANT
TAPE SURG TRANSPORE 1 IN (GAUZE/BANDAGES/DRESSINGS) IMPLANT
TAPE SURGICAL TRANSPORE 1 IN (GAUZE/BANDAGES/DRESSINGS) ×2
WATER STERILE IRR 250ML POUR (IV SOLUTION) ×1 IMPLANT

## 2017-05-27 NOTE — Discharge Instructions (Signed)

## 2017-05-27 NOTE — Transfer of Care (Signed)
Immediate Anesthesia Transfer of Care Note  Patient: Brittney Martinez  Procedure(s) Performed: CATARACT EXTRACTION WITH  PHACOEMULSIFICATION AND INTRAOCULAR LENS PLACEMENT LEFT EYE (Left Eye)  Patient Location: PACU  Anesthesia Type:MAC  Level of Consciousness: awake  Airway & Oxygen Therapy: Patient Spontanous Breathing  Post-op Assessment: Report given to RN  Post vital signs: Reviewed  Last Vitals:  Vitals Value Taken Time  BP    Temp    Pulse    Resp    SpO2      Last Pain:  Vitals:   05/27/17 1202  TempSrc: Oral  PainSc: 0-No pain         Complications: No apparent anesthesia complications

## 2017-05-27 NOTE — Anesthesia Postprocedure Evaluation (Signed)
Anesthesia Post Note  Patient: SHANEDRA LAVE  Procedure(s) Performed: CATARACT EXTRACTION WITH  PHACOEMULSIFICATION AND INTRAOCULAR LENS PLACEMENT LEFT EYE (Left Eye)  Patient location during evaluation: Short Stay Anesthesia Type: MAC Level of consciousness: awake and alert and oriented Pain management: pain level controlled Vital Signs Assessment: post-procedure vital signs reviewed and stable Respiratory status: spontaneous breathing Cardiovascular status: blood pressure returned to baseline and stable Postop Assessment: adequate PO intake Anesthetic complications: no     Last Vitals:  Vitals:   05/27/17 1215 05/27/17 1220  BP: 140/76 115/89  Pulse:    Resp: 14 (!) 23  Temp:    SpO2: 98% 97%    Last Pain:  Vitals:   05/27/17 1202  TempSrc: Oral  PainSc: 0-No pain                 Deardra Hinkley

## 2017-05-27 NOTE — Op Note (Signed)
Date of Admission: 05/27/2017  Date of Surgery: 05/27/2017  Pre-Op Dx: Cataract Left  Eye  Post-Op Dx: Senile Combined Cataract  Left  Eye,  Dx Code Q96.438  Surgeon: Tonny Branch, M.D.  Assistants: None  Anesthesia: Topical with MAC  Indications: Painless, progressive loss of vision with compromise of daily activities.  Surgery: Cataract Extraction with Intraocular lens Implant Left Eye  Discription: The patient had dilating drops and viscous lidocaine placed into the Left eye in the pre-op holding area. After transfer to the operating room, a time out was performed. The patient was then prepped and draped. Beginning with a 45m blade a paracentesis port was made at the surgeon's 2 o'clock position. The anterior chamber was then filled with 1% non-preserved lidocaine. This was followed by filling the anterior chamber with Provisc.  A 2.468mkeratome blade was used to make a clear corneal incision at the temporal limbus.  A bent cystatome needle was used to create a continuous tear capsulotomy. Hydrodissection was performed with balanced salt solution on a Fine canula. The lens nucleus was then removed using the phacoemulsification handpiece. Residual cortex was removed with the I&A handpiece. The anterior chamber and capsular bag were refilled with Provisc. A posterior chamber intraocular lens was placed into the capsular bag with it's injector. The implant was positioned with the Kuglan hook. The Provisc was then removed from the anterior chamber and capsular bag with the I&A handpiece. Stromal hydration of the main incision and paracentesis port was performed with BSS on a Fine canula. The wounds were tested for leak which was negative. The patient tolerated the procedure well. There were no operative complications. The patient was then transferred to the recovery room in stable condition.  Complications: None  Specimen: None  EBL: None  Prosthetic device: J&J Technis, PCB00, power 21.5, SN  [id number]

## 2017-05-27 NOTE — H&P (Signed)
I have reviewed the H&P, the patient was re-examined, and I have identified no interval changes in medical condition and plan of care since the history and physical of record  

## 2017-05-27 NOTE — Anesthesia Preprocedure Evaluation (Signed)
Anesthesia Evaluation  Patient identified by MRN, date of birth, ID band Patient awake    Reviewed: Allergy & Precautions, NPO status , Patient's Chart, lab work & pertinent test results  History of Anesthesia Complications (+) PONV and history of anesthetic complications  Airway Mallampati: III  TM Distance: >3 FB   Mouth opening: Limited Mouth Opening  Dental  (+) Edentulous Upper, Edentulous Lower   Pulmonary asthma , sleep apnea and Continuous Positive Airway Pressure Ventilation , COPD, Current Smoker,    breath sounds clear to auscultation       Cardiovascular hypertension, Pt. on medications + Peripheral Vascular Disease   Rhythm:Regular Rate:Normal     Neuro/Psych PSYCHIATRIC DISORDERS Anxiety Depression TIACVA    GI/Hepatic GERD  ,  Endo/Other  diabetes, Type 2  Renal/GU      Musculoskeletal  (+) Arthritis , Rheumatoid disorders,    Abdominal   Peds  Hematology   Anesthesia Other Findings   Reproductive/Obstetrics                             Anesthesia Physical Anesthesia Plan  ASA: III  Anesthesia Plan: MAC   Post-op Pain Management:    Induction: Intravenous  PONV Risk Score and Plan:   Airway Management Planned: Nasal Cannula  Additional Equipment:   Intra-op Plan:   Post-operative Plan:   Informed Consent: I have reviewed the patients History and Physical, chart, labs and discussed the procedure including the risks, benefits and alternatives for the proposed anesthesia with the patient or authorized representative who has indicated his/her understanding and acceptance.     Plan Discussed with:   Anesthesia Plan Comments:         Anesthesia Quick Evaluation

## 2017-05-28 ENCOUNTER — Encounter (HOSPITAL_COMMUNITY): Payer: Self-pay | Admitting: Ophthalmology

## 2017-05-28 NOTE — Addendum Note (Signed)
Addendum  created 05/28/17 1442 by Ollen Bowl, CRNA   Attestation recorded in [place], [place] filed

## 2017-08-28 ENCOUNTER — Ambulatory Visit: Payer: Medicare Other | Admitting: Nurse Practitioner

## 2017-08-28 ENCOUNTER — Encounter

## 2017-11-19 ENCOUNTER — Encounter: Payer: Self-pay | Admitting: Nurse Practitioner

## 2017-11-19 ENCOUNTER — Ambulatory Visit (INDEPENDENT_AMBULATORY_CARE_PROVIDER_SITE_OTHER): Payer: Medicare Other | Admitting: Nurse Practitioner

## 2017-11-19 ENCOUNTER — Telehealth: Payer: Self-pay

## 2017-11-19 VITALS — BP 139/73 | HR 62 | Temp 97.4°F | Ht 62.5 in | Wt 200.0 lb

## 2017-11-19 DIAGNOSIS — K219 Gastro-esophageal reflux disease without esophagitis: Secondary | ICD-10-CM

## 2017-11-19 DIAGNOSIS — K5909 Other constipation: Secondary | ICD-10-CM

## 2017-11-19 DIAGNOSIS — K581 Irritable bowel syndrome with constipation: Secondary | ICD-10-CM | POA: Diagnosis not present

## 2017-11-19 MED ORDER — LUBIPROSTONE 8 MCG PO CAPS: 8.0000 ug | ORAL_CAPSULE | Freq: Every day | ORAL | 3 refills | Status: DC

## 2017-11-19 MED ORDER — HYOSCYAMINE SULFATE 0.125 MG SL SUBL: 0.1250 mg | SUBLINGUAL_TABLET | Freq: Four times a day (QID) | SUBLINGUAL | 0 refills | Status: DC | PRN

## 2017-11-19 NOTE — Patient Instructions (Signed)
1. Stop taking stool softener for now. 2. I have refilled your Levsin. 3. I have refilled your Amitiza. 4. Take Amitiza 8 mcg, once a day on a full stomach.  Call us in a couple weeks and let us know if this is helping your bowel movements.  We can make medication adjustments over the phone. 5. Return for follow-up in 3 months. 6. Call us if you have any questions or concerns.  At [facility] we value your feedback. You may receive a survey about your visit today. Please share your experience as we strive to create trusting relationships with our patients to provide genuine, compassionate, quality care.  We appreciate your understanding and patience as we review any laboratory studies, imaging, and other diagnostic tests that are ordered as we care for you. Our office policy is 5 business days for review of these results, and any emergent or urgent results are addressed in a timely manner for your best interest. If you do not hear from our office in 1 week, please contact us.   We also encourage the use of MyChart, which contains your medical information for your review as well. If you are not enrolled in this feature, an access code is on this after visit summary for your convenience. Thank you for allowing [place] to be involved in your care.  It was great to see you today!  I hope you have a great summer!!

## 2017-11-19 NOTE — Progress Notes (Signed)
Referring Provider: Glenda Chroman, MD Primary Care Physician:  Glenda Chroman, MD Primary GI:  Dr. Gala Romney  Chief Complaint  Patient presents with  . Abdominal Pain    comes/goes    HPI:   Brittney Martinez is a 62 y.o. female who presents for abdominal pain.  The patient was last seen in our office 01/24/2016 for IBS and diarrhea.  The patient at one point had more constipation than diarrhea and previously tried and failed Amitiza 8 mcg, Linzess 145 mcg, Linzess 290 mcg.  At her last visit she was doing okay and tried Trulance but "it may my stomach grouchy so I stopped taking it."  When she stopped taking it, started taking a stool softener which did okay but made her stools loose.  She cannot remember what it was she was taking.  She started having diarrhea 3 days prior and was having 10+ stools a day which were watery and made her rectum sore.  No overt blood despite history of hemorrhoids.  Took Levsin and this caused abdominal cramping and nausea.  Recommended stool studies, labs, if stool studies negative could provide antidiarrheal, call with any worsening, follow-up in 4 weeks.  She did not have her labs completed.  She did not follow-up.  Today she states she's doing ok overall. Still with abdominal pain with IBS, pain is lower abdomen. Pain described as sometimes sharp, sometimes not; varying intensity. Pain sometimes improves with a bowel movement. Has mixed constipation and diarrhea; typically has a hard stool she passes and then has several episodes of diarrhea. She takes a stool softener, if she takes one a day her bowels don't move well but if she takes twice day she'll have diarrhea. Also has intermittent nausea. GERD symptoms well managed on Nexium, occasionally has to use TUMS for breakthrough but not often. She is taking Amitiza every other day which isn't as effective, if she takes it twice a day she has diarrhea. Hasn't tried to take it once every day. Denies hematochezia, melena,  vomiting, fever, chills, unintentional weight loss. Denies chest pain, dyspnea, dizziness, lightheadedness, syncope, near syncope. Denies any other upper or lower GI symptoms.  The patient had a colonoscopy in 2015. Recommended 7 year repeat. However, her father had colon CA and passed away from this. Will adjust recall to 5 years.  Past Medical History:  Diagnosis Date  . Abdominal aortic stenosis   . Anal fissure   . Anxiety   . Asthma   . Complication of anesthesia   . COPD (chronic obstructive pulmonary disease) ([place])   . Depression    grief  . Diabetes mellitus   . Diverticulitis   . GERD (gastroesophageal reflux disease)   . Glaucoma   . Gout   . Hypertension   . IBS (irritable bowel syndrome)   . PONV (postoperative nausea and vomiting)   . RA (rheumatoid arthritis) ([place])   . Sleep apnea    cpap; patient has old CPAP and not using and is waiting on new one.  . Stroke ([place])    "mini-stroke"- no deficits  . TIA (transient ischemic attack)    2010. No deficits    Past Surgical History:  Procedure Laterality Date  . ABDOMINAL HYSTERECTOMY  1982   complete  . BREAST LUMPECTOMY     right-benign  . CATARACT EXTRACTION W/PHACO Right 05/13/2017   Procedure: CATARACT EXTRACTION PHACO AND INTRAOCULAR LENS PLACEMENT (IOC);  Surgeon: Tonny Branch, MD;  Location: [facility];  Service: Ophthalmology;  Laterality: Right;  CDE: 8.59  . CATARACT EXTRACTION W/PHACO Left 05/27/2017   Procedure: CATARACT EXTRACTION WITH  PHACOEMULSIFICATION AND INTRAOCULAR LENS PLACEMENT LEFT EYE;  Surgeon: Tonny Branch, MD;  Location: [facility];  Service: Ophthalmology;  Laterality: Left;  CDE: 5.66  . CHOLECYSTECTOMY  2003  . COLONOSCOPY  12/14/09   Dr. Gala Romney :anal papilla and hemorrhoids,diminutive hperplastic rectal polyps/normal colon  . COLONOSCOPY N/A 10/29/2013   [facility]:SEGB papilla and internal hemorrhoids; colonic polyps-removed as described above. I suspect benign anorectal bleeding in the setting of  hemorrhoids and  possibly fissure. tubular adenoma. next TCS 10/2020.  . ELBOW SURGERY     right  . ESOPHAGOGASTRODUODENOSCOPY  12/14/09   Dr. Gala Romney :schatzkis ring 64F, otherwise normal  . ESOPHAGOGASTRODUODENOSCOPY N/A 10/29/2013   RMR: Distal esophageal pseudodiverticulum/Nissen fundoplication  . FINGER SURGERY     on right middle, pointer, and index fingers  . FISSURECTOMY     several  . INCISIONAL HERNIA REPAIR N/A 03/22/2014   Procedure: [name] HERNIORRHAPHY;  Surgeon: Jamesetta So, MD;  Location: [facility];  Service: General;  Laterality: N/A;  . INSERTION OF MESH N/A 03/22/2014   Procedure: INSERTION OF MESH;  Surgeon: Jamesetta So, MD;  Location: [facility];  Service: General;  Laterality: N/A;  . NISSEN FUNDOPLICATION  1517   [facility] [address]  . WRIST SURGERY     left    Current Outpatient Medications  Medication Sig Dispense Refill  . albuterol (PROVENTIL HFA;VENTOLIN HFA) 108 (90 BASE) MCG/ACT inhaler Inhale 2 puffs into the lungs every 6 (six) hours as needed for wheezing or shortness of breath.     Marland Kitchen albuterol (PROVENTIL) (2.5 MG/3ML) 0.083% nebulizer solution Take 2.5 mg by nebulization 3 (three) times daily as needed for wheezing or shortness of breath.    Marland Kitchen aspirin 81 MG tablet Take 81 mg by mouth daily.    Marland Kitchen atorvastatin (LIPITOR) 10 MG tablet Take 10 mg by mouth daily.    . budesonide-formoterol (SYMBICORT) 160-4.5 MCG/ACT inhaler Inhale 1 puff into the lungs 2 (two) times daily.     . calcium carbonate (OS-CAL) 600 MG TABS Take 600 mg by mouth 2 (two) times daily with a meal.    . clopidogrel (PLAVIX) 75 MG tablet Take 75 mg by mouth daily.      . cycloSPORINE (RESTASIS) 0.05 % ophthalmic emulsion Place 1 drop into both eyes 2 (two) times daily.      Marland Kitchen doxepin (SINEQUAN) 75 MG capsule Take 75 mg by mouth at bedtime.      Marland Kitchen esomeprazole (NEXIUM) 40 MG capsule Take 1 capsule (40 mg total) by mouth 2 (two) times daily before a meal. 60 capsule 5  . etanercept (ENBREL) 50  MG/ML injection Inject 50 mg into the skin every Thursday.    . folic acid (FOLVITE) 1 MG tablet Take 1 mg by mouth daily.     Marland Kitchen glipiZIDE (GLUCOTROL) 10 MG tablet Take 10 mg by mouth 2 (two) times daily before a meal.    . hydrocortisone (ANUSOL-HC) 2.5 % rectal cream Place 1 application rectally 2 (two) times daily. (Patient taking differently: Place 1 application rectally 2 (two) times daily as needed for hemorrhoids or anal itching. ) 30 g 0  . hyoscyamine (LEVSIN SL) 0.125 MG SL tablet Place 1 tablet (0.125 mg total) under the tongue every 6 (six) hours as needed for cramping. 120 tablet 0  . ibuprofen (ADVIL,MOTRIN) 200 MG tablet Take 400 mg by mouth every  6 (six) hours as needed for headache or moderate pain.    Marland Kitchen insulin detemir (LEVEMIR) 100 UNIT/ML injection Inject 40 Units into the skin 2 (two) times daily.     Marland Kitchen lisinopril (PRINIVIL,ZESTRIL) 2.5 MG tablet Take 2.5 mg by mouth daily.    Marland Kitchen lubiprostone (AMITIZA) 8 MCG capsule Take 8 mcg by mouth 2 (two) times daily as needed for constipation.    . methotrexate (RHEUMATREX) 2.5 MG tablet Take 10 mg by mouth See admin instructions. Take 10 mg mg by mouth twice weekly on Thursday and Fridays.  Caution:Chemotherapy. Protect from light.    . metoprolol (TOPROL-XL) 100 MG 24 hr tablet Take 100 mg by mouth daily.     . Nitroglycerin (RECTIV) 0.4 % OINT Place 1 inch rectally 2 (two) times daily. (Patient taking differently: Place 1 inch rectally 2 (two) times daily as needed (for itching). ) 30 g 0   No current facility-administered medications for this visit.     Allergies as of 11/19/2017 - Review Complete 11/19/2017  Allergen Reaction Noted  . Aspirin Nausea Only   . Propoxyphene n-acetaminophen Nausea And Vomiting   . Zithromax [azithromycin] Nausea Only 10/01/2010  . Erythromycin Rash 02/05/2014    Family History  Problem Relation Age of Onset  . Colon cancer Father 83  . Hypertension Sister   . Hypertension Brother   . Kidney  disease Brother     Social History   Socioeconomic History  . Marital status: Widowed    Spouse name: Not on file  . Number of children: Not on file  . Years of education: Not on file  . Highest education level: Not on file  Occupational History  . Occupation: disabled  Social Needs  . Financial resource strain: Not on file  . Food insecurity:    Worry: Not on file    Inability: Not on file  . Transportation needs:    Medical: Not on file    Non-medical: Not on file  Tobacco Use  . Smoking status: Former Smoker    Packs/day: 0.25    Years: 19.00    Pack years: 4.75    Types: Cigarettes    Last attempt to quit: 01/09/2007    Years since quitting: 10.8  . Smokeless tobacco: Never Used  Substance and Sexual Activity  . Alcohol use: No  . Drug use: No  . Sexual activity: Never  Lifestyle  . Physical activity:    Days per week: Not on file    Minutes per session: Not on file  . Stress: Not on file  Relationships  . Social connections:    Talks on phone: Not on file    Gets together: Not on file    Attends religious service: Not on file    Active member of club or organization: Not on file    Attends meetings of clubs or organizations: Not on file    Relationship status: Not on file  Other Topics Concern  . Not on file  Social History Narrative   1 son-healthy   1 daughter-MVA (drunk-driver)    Review of Systems: General: Negative for anorexia, weight loss, fever, chills, fatigue, weakness. CV: Negative for chest pain, angina, palpitations, peripheral edema.  Respiratory: Negative for dyspnea at rest, cough, sputum, wheezing.  GI: See history of present illness. Endo: Negative for unusual weight change.  Heme: Negative for bruising or bleeding. Allergy: Negative for rash or hives.   Physical Exam: BP 139/73   Pulse 62  Temp (!) 97.4 F (36.3 C) (Oral)   Ht 5' 2.5" (1.588 m)   Wt 200 lb (90.7 kg)   BMI 36.00 kg/m  General:   Alert and oriented.  Pleasant and cooperative. Well-nourished and well-developed.  Eyes:  Without icterus, sclera clear and conjunctiva pink.  Ears:  Normal auditory acuity. Cardiovascular:  S1, S2 present without murmurs appreciated. Extremities without clubbing or edema. Respiratory:  Clear to auscultation bilaterally. No wheezes, rales, or rhonchi. No distress.  Gastrointestinal:  +BS, soft, non-tender and non-distended. No HSM noted. No guarding or rebound. No masses appreciated.  Rectal:  Deferred  Musculoskalatal:  Symmetrical without gross deformities. Neurologic:  Alert and oriented x4;  grossly normal neurologically. Psych:  Alert and cooperative. Normal mood and affect. Heme/Lymph/Immune: No excessive bruising noted.    11/19/2017 8:16 AM   Disclaimer: This note was dictated with voice recognition software. Similar sounding words can inadvertently be transcribed and may not be corrected upon review.

## 2017-11-19 NOTE — Assessment & Plan Note (Signed)
GERD symptoms doing well on Nexium.  Has rare breakthrough symptoms when she takes Tums which is effective for her.  No medication adjustments needed at this time.  Continue her current medications at her current dosages and follow-up in 3 months.

## 2017-11-19 NOTE — Telephone Encounter (Signed)
LMOM, received a message from [facility] for medication Hyoscyamine 0.125 mg. Myles Rosenthal has a coupon if wanted by pt. Waiting on a return call.

## 2017-11-19 NOTE — Progress Notes (Signed)
CC'D TO PCP °

## 2017-11-19 NOTE — Assessment & Plan Note (Signed)
Given the patient's pattern of constipation and diarrhea wherein she will have constipation and passed a hard stool with subsequent diarrhea followed by constipation again, I feel she is likely more constipated with overflow diarrhea after she passes a hard stool.  I will adjust her regimen of Amitiza, refill her medication, have urged hold a stool softener so we can try to get on a single agent.  I will refill Levsin for as needed.  I have warned her that this could worsen her constipation if she does have to take it.  Follow-up in 3 months.

## 2017-11-19 NOTE — Assessment & Plan Note (Signed)
The patient likely has irritable bowel syndrome as an etiology for her constipation.  She is tried multiple medications including Amitiza 8 mcg, Linzess at 145 and 290 mcg, Trulance.  At this point she is on a stool softener once a day and if she takes it twice a day gives her diarrhea.  She takes Amitiza 8 mcg every other day and if she takes it twice every day and gives her diarrhea.  She has not tried once every day.  I will have her try Amitiza 8 mcg once daily, hold stool softener.  She is requesting Levsin if she does have an episode of diarrhea and I will refill this for her.  I will also refill her Amitiza.  Follow-up in 3 months.

## 2017-11-20 ENCOUNTER — Telehealth: Payer: Self-pay | Admitting: Internal Medicine

## 2017-11-20 NOTE — Telephone Encounter (Signed)
Pt called to say she was here yesterday and was given a prescription. She said the pharmacy wouldn't fill it because her insurance doesn't cover it and was there something else to call in. She doesn't know the name of prescription. She uses [facility]  [phone]

## 2017-11-20 NOTE — Telephone Encounter (Signed)
Spoke with pt and offered GoodRx coupon. Pt wanted to try the PA first. PA for Hyoscyamine 0.12 mg was done on [email or web address]. Waiting on approval or denial.

## 2017-11-29 NOTE — Telephone Encounter (Signed)
Received an approval letter for Hyoscyamine 0.125mg . LM with pt asking her to contact her pharmacy for the price. Medication was covered.

## 2018-01-04 ENCOUNTER — Emergency Department (HOSPITAL_COMMUNITY)
Admission: EM | Admit: 2018-01-04 | Discharge: 2018-01-04 | Disposition: A | Discharge status: Home / Self Care | Payer: Medicare Other | Attending: Emergency Medicine | Admitting: Emergency Medicine

## 2018-01-04 ENCOUNTER — Encounter (HOSPITAL_COMMUNITY): Payer: Self-pay

## 2018-01-04 DIAGNOSIS — Z8673 Personal history of transient ischemic attack (TIA), and cerebral infarction without residual deficits: Secondary | ICD-10-CM | POA: Insufficient documentation

## 2018-01-04 DIAGNOSIS — Z79899 Other long term (current) drug therapy: Secondary | ICD-10-CM | POA: Diagnosis not present

## 2018-01-04 DIAGNOSIS — Z87891 Personal history of nicotine dependence: Secondary | ICD-10-CM | POA: Diagnosis not present

## 2018-01-04 DIAGNOSIS — J449 Chronic obstructive pulmonary disease, unspecified: Secondary | ICD-10-CM | POA: Diagnosis not present

## 2018-01-04 DIAGNOSIS — Z7902 Long term (current) use of antithrombotics/antiplatelets: Secondary | ICD-10-CM | POA: Insufficient documentation

## 2018-01-04 DIAGNOSIS — E119 Type 2 diabetes mellitus without complications: Secondary | ICD-10-CM | POA: Insufficient documentation

## 2018-01-04 DIAGNOSIS — I10 Essential (primary) hypertension: Secondary | ICD-10-CM | POA: Diagnosis not present

## 2018-01-04 DIAGNOSIS — Z7982 Long term (current) use of aspirin: Secondary | ICD-10-CM | POA: Diagnosis not present

## 2018-01-04 DIAGNOSIS — T783XXA Angioneurotic edema, initial encounter: Secondary | ICD-10-CM | POA: Diagnosis present

## 2018-01-04 MED ORDER — METHYLPREDNISOLONE SODIUM SUCC 125 MG IJ SOLR
INTRAMUSCULAR | Status: AC
  Filled 2018-01-04: qty 2

## 2018-01-04 MED ORDER — FAMOTIDINE 20 MG PO TABS: 20.0000 mg | ORAL_TABLET | Freq: Two times a day (BID) | ORAL | 0 refills | Status: DC

## 2018-01-04 MED ORDER — PREDNISONE 20 MG PO TABS: 20.0000 mg | ORAL_TABLET | Freq: Every day | ORAL | 0 refills | Status: DC

## 2018-01-04 MED ORDER — FAMOTIDINE IN NACL 20-0.9 MG/50ML-% IV SOLN
20.0000 mg | Freq: Once | INTRAVENOUS | Status: AC
  Administered 2018-01-04: 20 mg via INTRAVENOUS

## 2018-01-04 MED ORDER — METHYLPREDNISOLONE SODIUM SUCC 125 MG IJ SOLR
125.0000 mg | Freq: Once | INTRAMUSCULAR | Status: AC
  Administered 2018-01-04: 125 mg via INTRAVENOUS

## 2018-01-04 MED ORDER — FAMOTIDINE IN NACL 20-0.9 MG/50ML-% IV SOLN
INTRAVENOUS | Status: AC
  Filled 2018-01-04: qty 50

## 2018-01-04 MED ORDER — DIPHENHYDRAMINE HCL 50 MG/ML IJ SOLN
INTRAMUSCULAR | Status: AC
  Filled 2018-01-04: qty 1

## 2018-01-04 MED ORDER — DIPHENHYDRAMINE HCL 50 MG/ML IJ SOLN
25.0000 mg | Freq: Once | INTRAMUSCULAR | Status: AC
  Administered 2018-01-04: 25 mg via INTRAVENOUS

## 2018-01-04 NOTE — ED Provider Notes (Signed)
[facility] EMERGENCY DEPARTMENT Provider Note   CSN: [id number] Arrival date & time: 01/04/18  0234    History   Chief Complaint Chief Complaint  Patient presents with  . Angioedema    HPI Brittney Martinez is a 63 y.o. female.  HPI  The patient is a very pleasant 62 year old female, she has a known history of diabetes, COPD and asthma.  She is being treated for high blood pressure with medications including lisinopril, metoprolol as well as taking Plavix, a statin, and multiple other medications.  She is on methotrexate as well.  She presents this evening stating that at 10:00 this evening she noticed that her tongue started to swell.  She was awake, this has been persistent, gradually worsening, not associated with any rashes or swelling of her lips.  She denies any itching, headaches, changes in vision, chest pain shortness of breath or palpitations.  The symptoms are persistent, nothing seems to make this better or worse, they became severe this evening.  She is still able to talk and tolerate secretions and has no difficulty breathing.  Past Medical History:  Diagnosis Date  . Abdominal aortic stenosis   . Anal fissure   . Anxiety   . Asthma   . Complication of anesthesia   . COPD (chronic obstructive pulmonary disease) ([place])   . Depression    grief  . Diabetes mellitus   . Diverticulitis   . GERD (gastroesophageal reflux disease)   . Glaucoma   . Gout   . Hypertension   . IBS (irritable bowel syndrome)   . PONV (postoperative nausea and vomiting)   . RA (rheumatoid arthritis) ([place])   . Sleep apnea    cpap; patient has old CPAP and not using and is waiting on new one.  . Stroke ([place])    "mini-stroke"- no deficits  . TIA (transient ischemic attack)    2010. No deficits    Patient Active Problem List   Diagnosis Date Noted  . Diarrhea 01/24/2016  . Melena 11/01/2014  . Incisional hernia 03/22/2014  . Hemorrhoids 02/05/2014  . Periumbilical hernia 97/04/6376    . Fatty liver 02/05/2014  . Anorectal fissure 11/30/2013  . IBS (irritable bowel syndrome) 09/29/2013  . Heme + stool 09/29/2013  . Rectal pain 09/29/2013  . Proctalgia 01/09/2011  . Obesity 01/09/2011  . Family hx of colon cancer 01/09/2011  . Intertriginous candidiasis 01/09/2011  . GERD 11/15/2009  . Constipation 11/15/2009  . RECTAL BLEEDING 11/15/2009  . DYSPHAGIA UNSPECIFIED 11/15/2009  . EPIGASTRIC PAIN 11/15/2009    Past Surgical History:  Procedure Laterality Date  . ABDOMINAL HYSTERECTOMY  1982   complete  . BREAST LUMPECTOMY     right-benign  . CATARACT EXTRACTION W/PHACO Right 05/13/2017   Procedure: CATARACT EXTRACTION PHACO AND INTRAOCULAR LENS PLACEMENT (IOC);  Surgeon: Tonny Branch, MD;  Location: [facility];  Service: Ophthalmology;  Laterality: Right;  CDE: 8.59  . CATARACT EXTRACTION W/PHACO Left 05/27/2017   Procedure: CATARACT EXTRACTION WITH  PHACOEMULSIFICATION AND INTRAOCULAR LENS PLACEMENT LEFT EYE;  Surgeon: Tonny Branch, MD;  Location: [facility];  Service: Ophthalmology;  Laterality: Left;  CDE: 5.66  . CHOLECYSTECTOMY  2003  . COLONOSCOPY  12/14/09   Dr. Gala Romney :anal papilla and hemorrhoids,diminutive hperplastic rectal polyps/normal colon  . COLONOSCOPY N/A 10/29/2013   HYI:FOYD papilla and internal hemorrhoids; colonic polyps-removed as described above. I suspect benign anorectal bleeding in the setting of hemorrhoids and  possibly fissure. tubular adenoma. next TCS 10/2020.  . ELBOW  SURGERY     right  . ESOPHAGOGASTRODUODENOSCOPY  12/14/09   Dr. Gala Romney :schatzkis ring 29F, otherwise normal  . ESOPHAGOGASTRODUODENOSCOPY N/A 10/29/2013   RMR: Distal esophageal pseudodiverticulum/Nissen fundoplication  . FINGER SURGERY     on right middle, pointer, and index fingers  . FISSURECTOMY     several  . INCISIONAL HERNIA REPAIR N/A 03/22/2014   Procedure: [name] HERNIORRHAPHY;  Surgeon: Jamesetta So, MD;  Location: [facility];  Service: General;  Laterality: N/A;  .  INSERTION OF MESH N/A 03/22/2014   Procedure: INSERTION OF MESH;  Surgeon: Jamesetta So, MD;  Location: [facility];  Service: General;  Laterality: N/A;  . NISSEN FUNDOPLICATION  [id number]   [facility] [place]  . WRIST SURGERY     left     OB History    Gravida  2   Para  2   Term  2   Preterm      AB      Living  2     SAB      TAB      Ectopic      Multiple      Live Births               Home Medications    Prior to Admission medications   Medication Sig Start Date End Date Taking? Authorizing Provider  albuterol (PROVENTIL HFA;VENTOLIN HFA) 108 (90 BASE) MCG/ACT inhaler Inhale 2 puffs into the lungs every 6 (six) hours as needed for wheezing or shortness of breath.     [provider]  albuterol (PROVENTIL) (2.5 MG/3ML) 0.083% nebulizer solution Take 2.5 mg by nebulization 3 (three) times daily as needed for wheezing or shortness of breath.    [provider]  aspirin 81 MG tablet Take 81 mg by mouth daily.    [provider]  atorvastatin (LIPITOR) 10 MG tablet Take 10 mg by mouth daily.    [provider]  budesonide-formoterol (SYMBICORT) 160-4.5 MCG/ACT inhaler Inhale 1 puff into the lungs 2 (two) times daily.     [provider]  calcium carbonate (OS-CAL) 600 MG TABS Take 600 mg by mouth 2 (two) times daily with a meal.    [provider]  clopidogrel (PLAVIX) 75 MG tablet Take 75 mg by mouth daily.      [provider]  cycloSPORINE (RESTASIS) 0.05 % ophthalmic emulsion Place 1 drop into both eyes 2 (two) times daily.      [provider]  doxepin (SINEQUAN) 75 MG capsule Take 75 mg by mouth at bedtime.      [provider]  esomeprazole (NEXIUM) 40 MG capsule Take 1 capsule (40 mg total) by mouth 2 (two) times daily before a meal. 05/12/14   Mahala Menghini, PA-C  etanercept (ENBREL) 50 MG/ML injection Inject 50 mg into the skin every Thursday.    [provider]  famotidine  (PEPCID) 20 MG tablet Take 1 tablet (20 mg total) by mouth 2 (two) times daily. 01/04/18   Noemi Chapel, MD  folic acid (FOLVITE) 1 MG tablet Take 1 mg by mouth daily.     [provider]  glipiZIDE (GLUCOTROL) 10 MG tablet Take 10 mg by mouth 2 (two) times daily before a meal.    [provider]  hydrocortisone (ANUSOL-HC) 2.5 % rectal cream Place 1 application rectally 2 (two) times daily. Patient taking differently: Place 1 application rectally 2 (two) times daily as needed for hemorrhoids or anal itching.  11/01/14   Mahala Menghini, PA-C  hyoscyamine (LEVSIN SL) 0.125 MG SL tablet Place 1 tablet (0.125 mg total) under the tongue every 6 (six) hours as needed for cramping. 11/19/17   Carlis Stable, NP  ibuprofen (ADVIL,MOTRIN) 200 MG tablet Take 400 mg by mouth every 6 (six) hours as needed for headache or moderate pain.    [provider]  insulin detemir (LEVEMIR) 100 UNIT/ML injection Inject 40 Units into the skin 2 (two) times daily.     [provider]  lubiprostone (AMITIZA) 8 MCG capsule Take 1 capsule (8 mcg total) by mouth daily with breakfast. 11/19/17   Carlis Stable, NP  methotrexate (RHEUMATREX) 2.5 MG tablet Take 10 mg by mouth See admin instructions. Take 10 mg mg by mouth twice weekly on Thursday and Fridays.  Caution:Chemotherapy. Protect from light.    [provider]  metoprolol (TOPROL-XL) 100 MG 24 hr tablet Take 100 mg by mouth daily.     [provider]  Nitroglycerin (RECTIV) 0.4 % OINT Place 1 inch rectally 2 (two) times daily. Patient taking differently: Place 1 inch rectally 2 (two) times daily as needed (for itching).  11/01/14   Mahala Menghini, PA-C  predniSONE (DELTASONE) 20 MG tablet Take 1 tablet (20 mg total) by mouth daily for 5 days. 01/04/18 01/09/18  Noemi Chapel, MD    Family History Family History  Problem Relation Age of Onset  . Colon cancer Father 17  . Hypertension Sister   . Hypertension Brother   .  Kidney disease Brother     Social History Social History   Tobacco Use  . Smoking status: Former Smoker    Packs/day: 0.25    Years: 19.00    Pack years: 4.75    Types: Cigarettes    Last attempt to quit: 01/09/2007    Years since quitting: 10.9  . Smokeless tobacco: Never Used  Substance Use Topics  . Alcohol use: No  . Drug use: No     Allergies   Aspirin; Propoxyphene n-acetaminophen; Zithromax [azithromycin]; and Erythromycin   Review of Systems Review of Systems  All other systems reviewed and are negative.    Physical Exam Updated Vital Signs BP 128/70   Pulse 70   Temp 98.1 F (36.7 C) (Oral)   Resp (!) 23   Ht 1.575 m (5\' 2" )   Wt 88.5 kg   SpO2 94%   BMI 35.67 kg/m   Physical Exam  Constitutional: She appears well-developed and well-nourished. No distress.  HENT:  Head: Normocephalic and atraumatic.  Mouth/Throat: Oropharynx is clear and moist. No oropharyngeal exudate.  The oropharynx is able to be visualized however there is significant angioedema of the sublingual tissues as well as the right side of the tongue.  She is able to speak, there is no difficulty with phonation, there is no significant voice changes, she has no lymphadenopathy under the neck and a very supple neck.  There is no swelling of the lips.  Eyes: Pupils are equal, round, and reactive to light. Conjunctivae and EOM are normal. Right eye exhibits no discharge. Left eye exhibits no discharge. No scleral icterus.  Neck: Normal range of motion. Neck supple. No JVD present. No thyromegaly present.  Cardiovascular: Normal rate, regular rhythm, normal heart sounds and intact distal pulses. Exam reveals no gallop and no friction rub.  No murmur heard. Pulmonary/Chest: Effort normal and breath sounds normal. No respiratory distress. She has no wheezes. She has no rales.  Abdominal: Soft. Bowel sounds are normal. She exhibits no distension and no mass. There is no tenderness.    Musculoskeletal: Normal range of motion. She exhibits no edema or tenderness.  Lymphadenopathy:    She has no cervical adenopathy.  Neurological: She is alert. Coordination normal.  Skin: Skin is warm and dry. No rash noted. No erythema.  Psychiatric: She has a normal mood and affect. Her behavior is normal.  Nursing note and vitals reviewed.    ED Treatments / Results  Labs (all labs ordered are listed, but only abnormal results are displayed) Labs Reviewed - No data to display  EKG None  Radiology No results found.  Procedures Procedures (including critical care time)  Medications Ordered in ED Medications  methylPREDNISolone sodium succinate (SOLU-MEDROL) 125 mg/2 mL injection 125 mg (125 mg Intravenous Given 01/04/18 0253)  diphenhydrAMINE (BENADRYL) injection 25 mg (25 mg Intravenous Given 01/04/18 0253)  famotidine (PEPCID) IVPB 20 mg premix (0 mg Intravenous Stopped 01/04/18 0428)     Initial Impression / Assessment and Plan / ED Course  I have reviewed the triage vital signs and the nursing notes.  Pertinent labs & imaging results that were available during my care of the patient were reviewed by me and considered in my medical decision making (see chart for details).  Clinical Course as of Jan 05 524  Sat Jan 04, 2018  0344 The patient has been reexamined several times and up to this point she has had a very slight improvement in her tongue.  Voice sounds better, no swelling of the lips   [BM]  0418 Continues to have slight improvement on repeat exam, voice is normal at this time, more of the posterior OP visualized.   [BM]    Clinical Course User Index [BM] Noemi Chapel, MD    The patient has very clearly developed angioedema of her tongue likely secondary to an ACE inhibitor.  She will be given medications including a steroid, antihistamines and observation.  If this continues to worsen she may need intubation or admission.  On the final exam at 5:20 AM  the patient appeared extremely improved, she has hardly any swelling of her tongue and her phonation is back to normal, stable for discharge, the patient was made aware of the need to stop lisinopril immediately, she expressed understanding, the family member at the bedside endorsed the instructions.  Final Clinical Impressions(s) / ED Diagnoses   Final diagnoses:  Angioedema, initial encounter    ED Discharge Orders         Ordered    predniSONE (DELTASONE) 20 MG tablet  Daily     01/04/18 0518    famotidine (PEPCID) 20 MG tablet  2 times daily     01/04/18 0518           Noemi Chapel, MD 01/04/18 [phone]

## 2018-01-04 NOTE — ED Notes (Signed)
Pt states she went to bed around 10pm last evening and noticed she had some swelling to her tongue; pt states the swelling has gotten worse throughout the night

## 2018-01-04 NOTE — ED Triage Notes (Signed)
Pt noticed tongue swelling onset approx 10 pm, denies pain or other complaints.

## 2018-01-04 NOTE — ED Notes (Signed)
Patient on cardiac monitor at this time.

## 2018-01-04 NOTE — Discharge Instructions (Signed)
You have been diagnosed with an allergic reaction to 1 of the medications that you take for your blood pressure called lisinopril.  Please throw this medication away immediately.  Please call your doctor on Monday morning to let them know that you had angioedema reaction to this medication.  They will need to see you in the office this week to follow-up your blood pressure and to make sure that you continue to improve  Please take Benadryl every 6 hours for the next 24 hours, Pepcid, 20 mg twice a day for the next week and prednisone, 1 tablet daily for 5 days.  Please be aware that taking prednisone may cause your blood sugar to go up.  Be very careful about your dietary intake of carbohydrates during this time.  If you should develop increasing swelling of the tongue or the lips or things are getting worse please return to the emergency department emergently and immediately

## 2018-01-04 NOTE — ED Notes (Signed)
Pt given cup of ice upon request

## 2018-01-08 ENCOUNTER — Encounter (HOSPITAL_COMMUNITY): Payer: Self-pay | Admitting: *Deleted

## 2018-01-08 ENCOUNTER — Emergency Department (HOSPITAL_COMMUNITY)
Admission: EM | Admit: 2018-01-08 | Discharge: 2018-01-09 | Disposition: A | Discharge status: Home / Self Care | Payer: Medicare Other | Attending: Emergency Medicine | Admitting: Emergency Medicine

## 2018-01-08 DIAGNOSIS — R220 Localized swelling, mass and lump, head: Secondary | ICD-10-CM | POA: Diagnosis present

## 2018-01-08 DIAGNOSIS — Z79899 Other long term (current) drug therapy: Secondary | ICD-10-CM | POA: Diagnosis not present

## 2018-01-08 DIAGNOSIS — T783XXA Angioneurotic edema, initial encounter: Secondary | ICD-10-CM | POA: Insufficient documentation

## 2018-01-08 DIAGNOSIS — Z794 Long term (current) use of insulin: Secondary | ICD-10-CM | POA: Insufficient documentation

## 2018-01-08 DIAGNOSIS — Z7982 Long term (current) use of aspirin: Secondary | ICD-10-CM | POA: Insufficient documentation

## 2018-01-08 DIAGNOSIS — J449 Chronic obstructive pulmonary disease, unspecified: Secondary | ICD-10-CM | POA: Insufficient documentation

## 2018-01-08 DIAGNOSIS — Z87891 Personal history of nicotine dependence: Secondary | ICD-10-CM | POA: Diagnosis not present

## 2018-01-08 DIAGNOSIS — I10 Essential (primary) hypertension: Secondary | ICD-10-CM | POA: Diagnosis not present

## 2018-01-08 DIAGNOSIS — E119 Type 2 diabetes mellitus without complications: Secondary | ICD-10-CM | POA: Diagnosis not present

## 2018-01-08 MED ORDER — METHYLPREDNISOLONE SODIUM SUCC 125 MG IJ SOLR
125.0000 mg | Freq: Once | INTRAMUSCULAR | Status: AC
  Administered 2018-01-09: 125 mg via INTRAVENOUS
  Filled 2018-01-08: qty 2

## 2018-01-08 MED ORDER — DIPHENHYDRAMINE HCL 50 MG/ML IJ SOLN
25.0000 mg | Freq: Once | INTRAMUSCULAR | Status: AC
  Administered 2018-01-09: 25 mg via INTRAVENOUS
  Filled 2018-01-08: qty 1

## 2018-01-08 MED ORDER — FAMOTIDINE IN NACL 20-0.9 MG/50ML-% IV SOLN
20.0000 mg | Freq: Once | INTRAVENOUS | Status: AC
  Administered 2018-01-09: 20 mg via INTRAVENOUS
  Filled 2018-01-08: qty 50

## 2018-01-08 NOTE — ED Provider Notes (Signed)
[facility] EMERGENCY DEPARTMENT Provider Note   CSN: [id number] Arrival date & time: 01/08/18  2240  Time seen 23:10 PM    History   Chief Complaint Chief Complaint  Patient presents with  . Oral Swelling    HPI Brittney Martinez is a 62 y.o. female.  HPI patient states about 35 minutes prior to arrival she felt like her tongue was starting to swell.  She states she had eaten sausage and eggs prior to this happening.  She was seen for similar symptoms on November 2 and she was taken off of her lisinopril.  She even through all of her pills away in her daily pill dispenser to make sure that she did not take any pills that had touched the lisinopril.  She took her last dose of prednisone today at 3 PM.  She denies being on any new medications for her blood pressure.  She denies any feeling that she had swelling in her throat or having difficulty swallowing or breathing.  She denies any rash or itching.  She has never had this before the episode earlier this month.  She has an appointment tomorrow with her primary care doctor to discuss her blood pressure regimen.  PCP Glenda Chroman, MD   Past Medical History:  Diagnosis Date  . Abdominal aortic stenosis   . Anal fissure   . Anxiety   . Asthma   . Complication of anesthesia   . COPD (chronic obstructive pulmonary disease) ([place])   . Depression    grief  . Diabetes mellitus   . Diverticulitis   . GERD (gastroesophageal reflux disease)   . Glaucoma   . Gout   . Hypertension   . IBS (irritable bowel syndrome)   . PONV (postoperative nausea and vomiting)   . RA (rheumatoid arthritis) ([place])   . Sleep apnea    cpap; patient has old CPAP and not using and is waiting on new one.  . Stroke ([place])    "mini-stroke"- no deficits  . TIA (transient ischemic attack)    2010. No deficits    Patient Active Problem List   Diagnosis Date Noted  . Diarrhea 01/24/2016  . Melena 11/01/2014  . Incisional hernia 03/22/2014  . Hemorrhoids  02/05/2014  . Periumbilical hernia 91/47/8295  . Fatty liver 02/05/2014  . Anorectal fissure 11/30/2013  . IBS (irritable bowel syndrome) 09/29/2013  . Heme + stool 09/29/2013  . Rectal pain 09/29/2013  . Proctalgia 01/09/2011  . Obesity 01/09/2011  . Family hx of colon cancer 01/09/2011  . Intertriginous candidiasis 01/09/2011  . GERD 11/15/2009  . Constipation 11/15/2009  . RECTAL BLEEDING 11/15/2009  . DYSPHAGIA UNSPECIFIED 11/15/2009  . EPIGASTRIC PAIN 11/15/2009    Past Surgical History:  Procedure Laterality Date  . ABDOMINAL HYSTERECTOMY  1982   complete  . BREAST LUMPECTOMY     right-benign  . CATARACT EXTRACTION W/PHACO Right 05/13/2017   Procedure: CATARACT EXTRACTION PHACO AND INTRAOCULAR LENS PLACEMENT (IOC);  Surgeon: Tonny Branch, MD;  Location: [facility];  Service: Ophthalmology;  Laterality: Right;  CDE: 8.59  . CATARACT EXTRACTION W/PHACO Left 05/27/2017   Procedure: CATARACT EXTRACTION WITH  PHACOEMULSIFICATION AND INTRAOCULAR LENS PLACEMENT LEFT EYE;  Surgeon: Tonny Branch, MD;  Location: [facility];  Service: Ophthalmology;  Laterality: Left;  CDE: 5.66  . CHOLECYSTECTOMY  2003  . COLONOSCOPY  12/14/09   Dr. Gala Romney :anal papilla and hemorrhoids,diminutive hperplastic rectal polyps/normal colon  . COLONOSCOPY N/A 10/29/2013   AOZ:HYQM papilla and internal  hemorrhoids; colonic polyps-removed as described above. I suspect benign anorectal bleeding in the setting of hemorrhoids and  possibly fissure. tubular adenoma. next TCS 10/2020.  . ELBOW SURGERY     right  . ESOPHAGOGASTRODUODENOSCOPY  12/14/09   Dr. Gala Romney :schatzkis ring 87F, otherwise normal  . ESOPHAGOGASTRODUODENOSCOPY N/A 10/29/2013   RMR: Distal esophageal pseudodiverticulum/Nissen fundoplication  . FINGER SURGERY     on right middle, pointer, and index fingers  . FISSURECTOMY     several  . INCISIONAL HERNIA REPAIR N/A 03/22/2014   Procedure: [name] HERNIORRHAPHY;  Surgeon: Jamesetta So, MD;  Location:  [facility];  Service: General;  Laterality: N/A;  . INSERTION OF MESH N/A 03/22/2014   Procedure: INSERTION OF MESH;  Surgeon: Jamesetta So, MD;  Location: [facility];  Service: General;  Laterality: N/A;  . NISSEN FUNDOPLICATION  6195   [facility] [place]  . WRIST SURGERY     left     OB History    Gravida  2   Para  2   Term  2   Preterm      AB      Living  2     SAB      TAB      Ectopic      Multiple      Live Births               Home Medications    Prior to Admission medications   Medication Sig Start Date End Date Taking? Authorizing Provider  albuterol (PROVENTIL HFA;VENTOLIN HFA) 108 (90 BASE) MCG/ACT inhaler Inhale 2 puffs into the lungs every 6 (six) hours as needed for wheezing or shortness of breath.     [provider]  albuterol (PROVENTIL) (2.5 MG/3ML) 0.083% nebulizer solution Take 2.5 mg by nebulization 3 (three) times daily as needed for wheezing or shortness of breath.    [provider]  aspirin 81 MG tablet Take 81 mg by mouth daily.    [provider]  atorvastatin (LIPITOR) 10 MG tablet Take 10 mg by mouth daily.    [provider]  budesonide-formoterol (SYMBICORT) 160-4.5 MCG/ACT inhaler Inhale 1 puff into the lungs 2 (two) times daily.     [provider]  calcium carbonate (OS-CAL) 600 MG TABS Take 600 mg by mouth 2 (two) times daily with a meal.    [provider]  clopidogrel (PLAVIX) 75 MG tablet Take 75 mg by mouth daily.      [provider]  cycloSPORINE (RESTASIS) 0.05 % ophthalmic emulsion Place 1 drop into both eyes 2 (two) times daily.      [provider]  doxepin (SINEQUAN) 75 MG capsule Take 75 mg by mouth at bedtime.      [provider]  esomeprazole (NEXIUM) 40 MG capsule Take 1 capsule (40 mg total) by mouth 2 (two) times daily before a meal. 05/12/14   Mahala Menghini, PA-C  etanercept (ENBREL) 50 MG/ML injection Inject 50 mg into the skin every  Thursday.    [provider]  famotidine (PEPCID) 20 MG tablet Take 1 tablet (20 mg total) by mouth 2 (two) times daily. 01/09/18   Rolland Porter, MD  folic acid (FOLVITE) 1 MG tablet Take 1 mg by mouth daily.     [provider]  glipiZIDE (GLUCOTROL) 10 MG tablet Take 10 mg by mouth 2 (two) times daily before a meal.    [provider]  hydrocortisone (ANUSOL-HC) 2.5 %  rectal cream Place 1 application rectally 2 (two) times daily. Patient taking differently: Place 1 application rectally 2 (two) times daily as needed for hemorrhoids or anal itching.  11/01/14   Mahala Menghini, PA-C  hyoscyamine (LEVSIN SL) 0.125 MG SL tablet Place 1 tablet (0.125 mg total) under the tongue every 6 (six) hours as needed for cramping. 11/19/17   Carlis Stable, NP  ibuprofen (ADVIL,MOTRIN) 200 MG tablet Take 400 mg by mouth every 6 (six) hours as needed for headache or moderate pain.    [provider]  insulin detemir (LEVEMIR) 100 UNIT/ML injection Inject 40 Units into the skin 2 (two) times daily.     [provider]  lubiprostone (AMITIZA) 8 MCG capsule Take 1 capsule (8 mcg total) by mouth daily with breakfast. 11/19/17   Carlis Stable, NP  methotrexate (RHEUMATREX) 2.5 MG tablet Take 10 mg by mouth See admin instructions. Take 10 mg mg by mouth twice weekly on Thursday and Fridays.  Caution:Chemotherapy. Protect from light.    [provider]  metoprolol (TOPROL-XL) 100 MG 24 hr tablet Take 100 mg by mouth daily.     [provider]  Nitroglycerin (RECTIV) 0.4 % OINT Place 1 inch rectally 2 (two) times daily. Patient taking differently: Place 1 inch rectally 2 (two) times daily as needed (for itching).  11/01/14   Mahala Menghini, PA-C  predniSONE (DELTASONE) 20 MG tablet Take 2 po QD x 3d , then 1 po QD x 3d 01/09/18   Rolland Porter, MD    Family History Family History  Problem Relation Age of Onset  . Colon cancer Father 16  . Hypertension Sister   .  Hypertension Brother   . Kidney disease Brother     Social History Social History   Tobacco Use  . Smoking status: Former Smoker    Packs/day: 0.25    Years: 19.00    Pack years: 4.75    Types: Cigarettes    Last attempt to quit: 01/09/2007    Years since quitting: 11.0  . Smokeless tobacco: Never Used  Substance Use Topics  . Alcohol use: No  . Drug use: No     Allergies   Aspirin; Propoxyphene n-acetaminophen; Zithromax [azithromycin]; and Erythromycin   Review of Systems Review of Systems  All other systems reviewed and are negative.    Physical Exam Updated Vital Signs BP (!) 170/87 (BP Location: Left Arm)   Pulse 70   Temp 97.9 F (36.6 C) (Oral)   Resp 20   Ht 5' 1.5" (1.562 m)   Wt 88.5 kg   SpO2 97%   BMI 36.25 kg/m   Vital signs normal except to hypertension   Physical Exam  Constitutional: She is oriented to person, place, and time. She appears well-developed and well-nourished.  Non-toxic appearance. She does not appear ill. No distress.  HENT:  Head: Normocephalic and atraumatic.  Right Ear: External ear normal.  Left Ear: External ear normal.  Nose: Nose normal. No mucosal edema or rhinorrhea.  Mouth/Throat: Oropharynx is clear and moist and mucous membranes are normal. No dental abscesses or uvula swelling.  Has mild diffuse swelling of her tongue, speech is a little thick and slightly hard to understand  Eyes: Pupils are equal, round, and reactive to light. Conjunctivae and EOM are normal.  Neck: Normal range of motion and full passive range of motion without pain. Neck supple.  Cardiovascular: Normal rate, regular rhythm and normal heart sounds. Exam reveals no  gallop and no friction rub.  No murmur heard. Pulmonary/Chest: Effort normal and breath sounds normal. No respiratory distress. She has no wheezes. She has no rhonchi. She has no rales. She exhibits no tenderness and no crepitus.  Abdominal: Normal appearance.  Musculoskeletal:  Normal range of motion. She exhibits no edema or tenderness.  Moves all extremities well.   Neurological: She is alert and oriented to person, place, and time. She has normal strength. No cranial nerve deficit.  Skin: Skin is warm, dry and intact. No rash noted. No erythema. No pallor.  Psychiatric: She has a normal mood and affect. Her speech is normal and behavior is normal. Her mood appears not anxious.  Nursing note and vitals reviewed.    ED Treatments / Results  Labs (all labs ordered are listed, but only abnormal results are displayed) Labs Reviewed - No data to display  EKG None  Radiology No results found.  Procedures Procedures (including critical care time)  Medications Ordered in ED Medications  0.9 %  sodium chloride infusion (500 mLs Intravenous New Bag/Given 01/09/18 0033)  methylPREDNISolone sodium succinate (SOLU-MEDROL) 125 mg/2 mL injection 125 mg (125 mg Intravenous Given 01/09/18 0030)  famotidine (PEPCID) IVPB 20 mg premix (0 mg Intravenous Stopped 01/09/18 0123)  diphenhydrAMINE (BENADRYL) injection 25 mg (25 mg Intravenous Given 01/09/18 0031)     Initial Impression / Assessment and Plan / ED Course  I have reviewed the triage vital signs and the nursing notes.  Pertinent labs & imaging results that were available during my care of the patient were reviewed by me and considered in my medical decision making (see chart for details).     Patient was given IV Solu-Medrol, IV Pepcid, and IV Benadryl.   Recheck 12:25 AM nurse is just now in the room starting her IV.  Will reassess later.  She states that the swelling has not gotten worse or better.  Recheck 01:20 AM states she is feeling better, speech is much easier to understand. Her tongue appears normal. She feels ready to be discharged.   Final Clinical Impressions(s) / ED Diagnoses   Final diagnoses:  Angioedema, initial encounter    ED Discharge Orders         Ordered    predniSONE  (DELTASONE) 20 MG tablet  Status:  Discontinued     01/09/18 0138    famotidine (PEPCID) 20 MG tablet  2 times daily     01/09/18 0138    predniSONE (DELTASONE) 20 MG tablet     01/09/18 0139         Plan discharge  Rolland Porter, MD, Barbette Or, MD 01/09/18 [phone]

## 2018-01-08 NOTE — ED Triage Notes (Signed)
Pt with tongue swelling starting 30 min ago, pt here for same 6 days ago. Denies any sob or difficultly swallowing

## 2018-01-09 MED ORDER — PREDNISONE 20 MG PO TABS: ORAL_TABLET | ORAL | 0 refills | Status: DC

## 2018-01-09 MED ORDER — FAMOTIDINE 20 MG PO TABS: 20.0000 mg | ORAL_TABLET | Freq: Two times a day (BID) | ORAL | 0 refills | Status: DC

## 2018-01-09 MED ORDER — SODIUM CHLORIDE 0.9 % IV SOLN
INTRAVENOUS | Status: DC | PRN
  Administered 2018-01-09: 500 mL via INTRAVENOUS

## 2018-01-09 NOTE — Discharge Instructions (Addendum)
Take the medications as prescribed. Watch your blood sugars closely while on the prednisone, it can make your blood sugar get high. Consider seeing and [place] can call [facility] of the [place] to get an appointment at the [place] office.  Keep your appointment today with your primary care doctor to discuss treatment of your blood pressure.  Remember to avoid the lisinopril and all medications related to it in the future.

## 2018-02-20 ENCOUNTER — Ambulatory Visit: Payer: Medicare Other | Admitting: Nurse Practitioner

## 2018-02-20 NOTE — Progress Notes (Deleted)
Referring Provider: Glenda Chroman, MD Primary Care Physician:  Glenda Chroman, MD Primary GI:  Dr. Gala Romney  No chief complaint on file.   HPI:   Brittney Martinez is a 62 y.o. female who presents for follow-up on GERD and IBS.  The patient was last seen in our office 11/19/2017 for IBS constipation type and GERD.  Previously tried and failed Amitiza 8 mcg, Linzess 145 mcg, Linzess 290 mcg.  She was trialed on Trulance but it caused "my stomach to be grouchy so I stopped taking it."  At her last visit she was having lower abdominal pain sometimes sharp, sometimes not with variable intensity which improves after bowel movement.  Mixed constipation and diarrhea, typically hard stool that she passes and and several episodes of diarrhea.  Takes a stool softener and if she takes it once a day her bowels do not move well but if she takes twice a day she will have diarrhea.  GERD symptoms well managed on Nexium, occasional Tums for breakthrough but pretty rare.  Taking Amitiza every other day which is not as effective but if she takes it twice a day she has diarrhea.  Has not tried taking it once a day.  Colonoscopy up-to-date 2015 and since that time her father was diagnosed with colon cancer which he passed from and given that recommended recall to 2020.  Recommended stop stool softener, use Levsin for diarrhea and Amitiza once daily for constipation.  Follow-up in 3 months.  Today she states  Past Medical History:  Diagnosis Date  . Abdominal aortic stenosis   . Anal fissure   . Anxiety   . Asthma   . Complication of anesthesia   . COPD (chronic obstructive pulmonary disease) ([place])   . Depression    grief  . Diabetes mellitus   . Diverticulitis   . GERD (gastroesophageal reflux disease)   . Glaucoma   . Gout   . Hypertension   . IBS (irritable bowel syndrome)   . PONV (postoperative nausea and vomiting)   . RA (rheumatoid arthritis) ([place])   . Sleep apnea    cpap; patient has old CPAP and not  using and is waiting on new one.  . Stroke ([place])    "mini-stroke"- no deficits  . TIA (transient ischemic attack)    2010. No deficits    Past Surgical History:  Procedure Laterality Date  . ABDOMINAL HYSTERECTOMY  1982   complete  . BREAST LUMPECTOMY     right-benign  . CATARACT EXTRACTION W/PHACO Right 05/13/2017   Procedure: CATARACT EXTRACTION PHACO AND INTRAOCULAR LENS PLACEMENT (IOC);  Surgeon: Tonny Branch, MD;  Location: [facility];  Service: Ophthalmology;  Laterality: Right;  CDE: 8.59  . CATARACT EXTRACTION W/PHACO Left 05/27/2017   Procedure: CATARACT EXTRACTION WITH  PHACOEMULSIFICATION AND INTRAOCULAR LENS PLACEMENT LEFT EYE;  Surgeon: Tonny Branch, MD;  Location: [facility];  Service: Ophthalmology;  Laterality: Left;  CDE: 5.66  . CHOLECYSTECTOMY  2003  . COLONOSCOPY  12/14/09   Dr. Gala Romney :anal papilla and hemorrhoids,diminutive hperplastic rectal polyps/normal colon  . COLONOSCOPY N/A 10/29/2013   ZDG:LOVF papilla and internal hemorrhoids; colonic polyps-removed as described above. I suspect benign anorectal bleeding in the setting of hemorrhoids and  possibly fissure. tubular adenoma. next TCS 10/2020.  . ELBOW SURGERY     right  . ESOPHAGOGASTRODUODENOSCOPY  12/14/09   Dr. Gala Romney :schatzkis ring 79F, otherwise normal  . ESOPHAGOGASTRODUODENOSCOPY N/A 10/29/2013   RMR: Distal esophageal pseudodiverticulum/Nissen fundoplication  .  FINGER SURGERY     on right middle, pointer, and index fingers  . FISSURECTOMY     several  . INCISIONAL HERNIA REPAIR N/A 03/22/2014   Procedure: [name] HERNIORRHAPHY;  Surgeon: Jamesetta So, MD;  Location: [facility];  Service: General;  Laterality: N/A;  . INSERTION OF MESH N/A 03/22/2014   Procedure: INSERTION OF MESH;  Surgeon: Jamesetta So, MD;  Location: [facility];  Service: General;  Laterality: N/A;  . NISSEN FUNDOPLICATION  7169   DeMason [place]  . WRIST SURGERY     left    Current Outpatient Medications  Medication Sig Dispense Refill  .  albuterol (PROVENTIL HFA;VENTOLIN HFA) 108 (90 BASE) MCG/ACT inhaler Inhale 2 puffs into the lungs every 6 (six) hours as needed for wheezing or shortness of breath.     Marland Kitchen albuterol (PROVENTIL) (2.5 MG/3ML) 0.083% nebulizer solution Take 2.5 mg by nebulization 3 (three) times daily as needed for wheezing or shortness of breath.    Marland Kitchen aspirin 81 MG tablet Take 81 mg by mouth daily.    Marland Kitchen atorvastatin (LIPITOR) 10 MG tablet Take 10 mg by mouth daily.    . budesonide-formoterol (SYMBICORT) 160-4.5 MCG/ACT inhaler Inhale 1 puff into the lungs 2 (two) times daily.     . calcium carbonate (OS-CAL) 600 MG TABS Take 600 mg by mouth 2 (two) times daily with a meal.    . clopidogrel (PLAVIX) 75 MG tablet Take 75 mg by mouth daily.      . cycloSPORINE (RESTASIS) 0.05 % ophthalmic emulsion Place 1 drop into both eyes 2 (two) times daily.      Marland Kitchen doxepin (SINEQUAN) 75 MG capsule Take 75 mg by mouth at bedtime.      Marland Kitchen esomeprazole (NEXIUM) 40 MG capsule Take 1 capsule (40 mg total) by mouth 2 (two) times daily before a meal. 60 capsule 5  . etanercept (ENBREL) 50 MG/ML injection Inject 50 mg into the skin every Thursday.    . famotidine (PEPCID) 20 MG tablet Take 1 tablet (20 mg total) by mouth 2 (two) times daily. 18 tablet 0  . folic acid (FOLVITE) 1 MG tablet Take 1 mg by mouth daily.     Marland Kitchen glipiZIDE (GLUCOTROL) 10 MG tablet Take 10 mg by mouth 2 (two) times daily before a meal.    . hydrocortisone (ANUSOL-HC) 2.5 % rectal cream Place 1 application rectally 2 (two) times daily. (Patient taking differently: Place 1 application rectally 2 (two) times daily as needed for hemorrhoids or anal itching. ) 30 g 0  . hyoscyamine (LEVSIN SL) 0.125 MG SL tablet Place 1 tablet (0.125 mg total) under the tongue every 6 (six) hours as needed for cramping. 120 tablet 0  . ibuprofen (ADVIL,MOTRIN) 200 MG tablet Take 400 mg by mouth every 6 (six) hours as needed for headache or moderate pain.    Marland Kitchen insulin detemir (LEVEMIR) 100  UNIT/ML injection Inject 40 Units into the skin 2 (two) times daily.     Marland Kitchen lubiprostone (AMITIZA) 8 MCG capsule Take 1 capsule (8 mcg total) by mouth daily with breakfast. 30 capsule 3  . methotrexate (RHEUMATREX) 2.5 MG tablet Take 10 mg by mouth See admin instructions. Take 10 mg mg by mouth twice weekly on Thursday and Fridays.  Caution:Chemotherapy. Protect from light.    . metoprolol (TOPROL-XL) 100 MG 24 hr tablet Take 100 mg by mouth daily.     . Nitroglycerin (RECTIV) 0.4 % OINT Place 1 inch rectally 2 (two) times  daily. (Patient taking differently: Place 1 inch rectally 2 (two) times daily as needed (for itching). ) 30 g 0  . predniSONE (DELTASONE) 20 MG tablet Take 2 po QD x 3d , then 1 po QD x 3d 18 tablet 0   No current facility-administered medications for this visit.     Allergies as of 02/20/2018 - Review Complete 01/08/2018  Allergen Reaction Noted  . Aspirin Nausea Only   . Propoxyphene n-acetaminophen Nausea And Vomiting   . Zithromax [azithromycin] Nausea Only 10/01/2010  . Erythromycin Rash 02/05/2014    Family History  Problem Relation Age of Onset  . Colon cancer Father 51  . Hypertension Sister   . Hypertension Brother   . Kidney disease Brother     Social History   Socioeconomic History  . Marital status: Widowed    Spouse name: Not on file  . Number of children: Not on file  . Years of education: Not on file  . Highest education level: Not on file  Occupational History  . Occupation: disabled  Social Needs  . Financial resource strain: Not on file  . Food insecurity:    Worry: Not on file    Inability: Not on file  . Transportation needs:    Medical: Not on file    Non-medical: Not on file  Tobacco Use  . Smoking status: Former Smoker    Packs/day: 0.25    Years: 19.00    Pack years: 4.75    Types: Cigarettes    Last attempt to quit: 01/09/2007    Years since quitting: 11.1  . Smokeless tobacco: Never Used  Substance and Sexual Activity    . Alcohol use: No  . Drug use: No  . Sexual activity: Never  Lifestyle  . Physical activity:    Days per week: Not on file    Minutes per session: Not on file  . Stress: Not on file  Relationships  . Social connections:    Talks on phone: Not on file    Gets together: Not on file    Attends religious service: Not on file    Active member of club or organization: Not on file    Attends meetings of clubs or organizations: Not on file    Relationship status: Not on file  Other Topics Concern  . Not on file  Social History Narrative   1 son-healthy   1 daughter-MVA (drunk-driver)    Review of Systems: General: Negative for anorexia, weight loss, fever, chills, fatigue, weakness. Eyes: Negative for vision changes.  ENT: Negative for hoarseness, difficulty swallowing , nasal congestion. CV: Negative for chest pain, angina, palpitations, dyspnea on exertion, peripheral edema.  Respiratory: Negative for dyspnea at rest, dyspnea on exertion, cough, sputum, wheezing.  GI: See history of present illness. GU:  Negative for dysuria, hematuria, urinary incontinence, urinary frequency, nocturnal urination.  MS: Negative for joint pain, low back pain.  Derm: Negative for rash or itching.  Neuro: Negative for weakness, abnormal sensation, seizure, frequent headaches, memory loss, confusion.  Psych: Negative for anxiety, depression, suicidal ideation, hallucinations.  Endo: Negative for unusual weight change.  Heme: Negative for bruising or bleeding. Allergy: Negative for rash or hives.   Physical Exam: There were no vitals taken for this visit. General:   Alert and oriented. Pleasant and cooperative. Well-nourished and well-developed.  Head:  Normocephalic and atraumatic. Eyes:  Without icterus, sclera clear and conjunctiva pink.  Ears:  Normal auditory acuity. Mouth:  No deformity or lesions,  oral mucosa pink.  Throat/Neck:  Supple, without mass or thyromegaly. Cardiovascular:  S1,  S2 present without murmurs appreciated. Normal pulses noted. Extremities without clubbing or edema. Respiratory:  Clear to auscultation bilaterally. No wheezes, rales, or rhonchi. No distress.  Gastrointestinal:  +BS, soft, non-tender and non-distended. No HSM noted. No guarding or rebound. No masses appreciated.  Rectal:  Deferred  Musculoskalatal:  Symmetrical without gross deformities. Normal posture. Skin:  Intact without significant lesions or rashes. Neurologic:  Alert and oriented x4;  grossly normal neurologically. Psych:  Alert and cooperative. Normal mood and affect. Heme/Lymph/Immune: No significant cervical adenopathy. No excessive bruising noted.    02/20/2018 8:43 AM   Disclaimer: This note was dictated with voice recognition software. Similar sounding words can inadvertently be transcribed and may not be corrected upon review.

## 2018-03-19 ENCOUNTER — Emergency Department (HOSPITAL_COMMUNITY): Payer: Medicare Other

## 2018-03-19 ENCOUNTER — Inpatient Hospital Stay (HOSPITAL_COMMUNITY)
Admission: EM | Admit: 2018-03-19 | Discharge: 2018-03-22 | DRG: 871 | Disposition: A | Discharge status: Home / Self Care | Payer: Medicare Other | Attending: Internal Medicine | Admitting: Internal Medicine

## 2018-03-19 ENCOUNTER — Encounter (HOSPITAL_COMMUNITY): Payer: Self-pay | Admitting: Emergency Medicine

## 2018-03-19 ENCOUNTER — Other Ambulatory Visit: Payer: Self-pay

## 2018-03-19 DIAGNOSIS — Z90710 Acquired absence of both cervix and uterus: Secondary | ICD-10-CM

## 2018-03-19 DIAGNOSIS — R0902 Hypoxemia: Secondary | ICD-10-CM

## 2018-03-19 DIAGNOSIS — Z9841 Cataract extraction status, right eye: Secondary | ICD-10-CM

## 2018-03-19 DIAGNOSIS — E119 Type 2 diabetes mellitus without complications: Secondary | ICD-10-CM

## 2018-03-19 DIAGNOSIS — Z87891 Personal history of nicotine dependence: Secondary | ICD-10-CM

## 2018-03-19 DIAGNOSIS — Z888 Allergy status to other drugs, medicaments and biological substances status: Secondary | ICD-10-CM

## 2018-03-19 DIAGNOSIS — M069 Rheumatoid arthritis, unspecified: Secondary | ICD-10-CM | POA: Diagnosis present

## 2018-03-19 DIAGNOSIS — Z7902 Long term (current) use of antithrombotics/antiplatelets: Secondary | ICD-10-CM

## 2018-03-19 DIAGNOSIS — K760 Fatty (change of) liver, not elsewhere classified: Secondary | ICD-10-CM | POA: Diagnosis present

## 2018-03-19 DIAGNOSIS — M109 Gout, unspecified: Secondary | ICD-10-CM | POA: Diagnosis present

## 2018-03-19 DIAGNOSIS — J101 Influenza due to other identified influenza virus with other respiratory manifestations: Secondary | ICD-10-CM | POA: Diagnosis present

## 2018-03-19 DIAGNOSIS — Z23 Encounter for immunization: Secondary | ICD-10-CM

## 2018-03-19 DIAGNOSIS — F419 Anxiety disorder, unspecified: Secondary | ICD-10-CM | POA: Diagnosis present

## 2018-03-19 DIAGNOSIS — F329 Major depressive disorder, single episode, unspecified: Secondary | ICD-10-CM | POA: Diagnosis present

## 2018-03-19 DIAGNOSIS — J9601 Acute respiratory failure with hypoxia: Secondary | ICD-10-CM | POA: Diagnosis present

## 2018-03-19 DIAGNOSIS — K580 Irritable bowel syndrome with diarrhea: Secondary | ICD-10-CM | POA: Diagnosis present

## 2018-03-19 DIAGNOSIS — A419 Sepsis, unspecified organism: Secondary | ICD-10-CM | POA: Diagnosis not present

## 2018-03-19 DIAGNOSIS — Z8673 Personal history of transient ischemic attack (TIA), and cerebral infarction without residual deficits: Secondary | ICD-10-CM

## 2018-03-19 DIAGNOSIS — Z9842 Cataract extraction status, left eye: Secondary | ICD-10-CM

## 2018-03-19 DIAGNOSIS — Z7951 Long term (current) use of inhaled steroids: Secondary | ICD-10-CM

## 2018-03-19 DIAGNOSIS — Z794 Long term (current) use of insulin: Secondary | ICD-10-CM

## 2018-03-19 DIAGNOSIS — K219 Gastro-esophageal reflux disease without esophagitis: Secondary | ICD-10-CM | POA: Diagnosis present

## 2018-03-19 DIAGNOSIS — I350 Nonrheumatic aortic (valve) stenosis: Secondary | ICD-10-CM | POA: Diagnosis present

## 2018-03-19 DIAGNOSIS — J209 Acute bronchitis, unspecified: Secondary | ICD-10-CM | POA: Diagnosis present

## 2018-03-19 DIAGNOSIS — J441 Chronic obstructive pulmonary disease with (acute) exacerbation: Secondary | ICD-10-CM | POA: Diagnosis present

## 2018-03-19 DIAGNOSIS — G4733 Obstructive sleep apnea (adult) (pediatric): Secondary | ICD-10-CM

## 2018-03-19 DIAGNOSIS — Z881 Allergy status to other antibiotic agents status: Secondary | ICD-10-CM

## 2018-03-19 DIAGNOSIS — Z9049 Acquired absence of other specified parts of digestive tract: Secondary | ICD-10-CM

## 2018-03-19 DIAGNOSIS — J440 Chronic obstructive pulmonary disease with acute lower respiratory infection: Secondary | ICD-10-CM | POA: Diagnosis present

## 2018-03-19 DIAGNOSIS — Z9989 Dependence on other enabling machines and devices: Secondary | ICD-10-CM

## 2018-03-19 DIAGNOSIS — H409 Unspecified glaucoma: Secondary | ICD-10-CM | POA: Diagnosis present

## 2018-03-19 DIAGNOSIS — Z79899 Other long term (current) drug therapy: Secondary | ICD-10-CM

## 2018-03-19 DIAGNOSIS — Z7982 Long term (current) use of aspirin: Secondary | ICD-10-CM

## 2018-03-19 DIAGNOSIS — R197 Diarrhea, unspecified: Secondary | ICD-10-CM | POA: Diagnosis present

## 2018-03-19 DIAGNOSIS — Z8249 Family history of ischemic heart disease and other diseases of the circulatory system: Secondary | ICD-10-CM

## 2018-03-19 DIAGNOSIS — Z886 Allergy status to analgesic agent status: Secondary | ICD-10-CM

## 2018-03-19 DIAGNOSIS — I10 Essential (primary) hypertension: Secondary | ICD-10-CM | POA: Diagnosis present

## 2018-03-19 DIAGNOSIS — Z961 Presence of intraocular lens: Secondary | ICD-10-CM | POA: Diagnosis present

## 2018-03-19 DIAGNOSIS — E876 Hypokalemia: Secondary | ICD-10-CM | POA: Diagnosis present

## 2018-03-19 DIAGNOSIS — Z841 Family history of disorders of kidney and ureter: Secondary | ICD-10-CM

## 2018-03-19 LAB — INFLUENZA PANEL BY PCR (TYPE A & B)
Influenza A By PCR: NEGATIVE
Influenza B By PCR: POSITIVE — AB

## 2018-03-19 LAB — CBC WITH DIFFERENTIAL/PLATELET
Abs Immature Granulocytes: 0.03 10*3/uL (ref 0.00–0.07)
Basophils Absolute: 0 10*3/uL (ref 0.0–0.1)
Basophils Relative: 0 %
Eosinophils Absolute: 0 10*3/uL (ref 0.0–0.5)
Eosinophils Relative: 0 %
HCT: 43.1 % (ref 36.0–46.0)
Hemoglobin: 13.5 g/dL (ref 12.0–15.0)
Immature Granulocytes: 0 %
Lymphocytes Relative: 19 %
Lymphs Abs: 2.2 10*3/uL (ref 0.7–4.0)
MCH: 30.5 pg (ref 26.0–34.0)
MCHC: 31.3 g/dL (ref 30.0–36.0)
MCV: 97.3 fL (ref 80.0–100.0)
Monocytes Absolute: 1.1 10*3/uL — ABNORMAL HIGH (ref 0.1–1.0)
Monocytes Relative: 9 %
Neutro Abs: 8.3 10*3/uL — ABNORMAL HIGH (ref 1.7–7.7)
Neutrophils Relative %: 72 %
Platelets: 274 10*3/uL (ref 150–400)
RBC: 4.43 MIL/uL (ref 3.87–5.11)
RDW: 15.9 % — ABNORMAL HIGH (ref 11.5–15.5)
WBC: 11.6 10*3/uL — ABNORMAL HIGH (ref 4.0–10.5)
nRBC: 0 % (ref 0.0–0.2)

## 2018-03-19 LAB — COMPREHENSIVE METABOLIC PANEL
ALT: 36 U/L (ref 0–44)
AST: 34 U/L (ref 15–41)
Albumin: 3.7 g/dL (ref 3.5–5.0)
Alkaline Phosphatase: 75 U/L (ref 38–126)
Anion gap: 11 (ref 5–15)
BUN: 7 mg/dL — ABNORMAL LOW (ref 8–23)
CO2: 22 mmol/L (ref 22–32)
Calcium: 8.6 mg/dL — ABNORMAL LOW (ref 8.9–10.3)
Chloride: 103 mmol/L (ref 98–111)
Creatinine, Ser: 0.8 mg/dL (ref 0.44–1.00)
GFR calc Af Amer: >60 mL/min (ref >60)
GFR calc non Af Amer: >60 mL/min (ref >60)
Glucose, Bld: 97 mg/dL (ref 70–99)
Potassium: 3.3 mmol/L — ABNORMAL LOW (ref 3.5–5.1)
Sodium: 136 mmol/L (ref 135–145)
Total Bilirubin: 0.7 mg/dL (ref 0.3–1.2)
Total Protein: 7.7 g/dL (ref 6.5–8.1)

## 2018-03-19 LAB — GLUCOSE, CAPILLARY
Glucose-Capillary: 34 mg/dL — CL (ref 70–99)
Glucose-Capillary: 58 mg/dL — ABNORMAL LOW (ref 70–99)
Glucose-Capillary: 74 mg/dL (ref 70–99)

## 2018-03-19 LAB — URINALYSIS, ROUTINE W REFLEX MICROSCOPIC
Bilirubin Urine: NEGATIVE
Glucose, UA: NEGATIVE mg/dL
Hgb urine dipstick: NEGATIVE
Ketones, ur: NEGATIVE mg/dL
Leukocytes, UA: NEGATIVE
Nitrite: NEGATIVE
Protein, ur: NEGATIVE mg/dL
Specific Gravity, Urine: 1.003 — ABNORMAL LOW (ref 1.005–1.030)
pH: 7 (ref 5.0–8.0)

## 2018-03-19 LAB — CG4 I-STAT (LACTIC ACID)
Lactic Acid, Venous: 1.93 mmol/L — ABNORMAL HIGH (ref 0.5–1.9)
Lactic Acid, Venous: 0.89 mmol/L (ref 0.5–1.9)

## 2018-03-19 LAB — PROTIME-INR
INR: 1.04
Prothrombin Time: 13.5 seconds (ref 11.4–15.2)

## 2018-03-19 LAB — MAGNESIUM: Magnesium: 1.6 mg/dL — ABNORMAL LOW (ref 1.7–2.4)

## 2018-03-19 MED ORDER — ACETAMINOPHEN 650 MG RE SUPP: 650.0000 mg | Freq: Four times a day (QID) | RECTAL | Status: DC | PRN

## 2018-03-19 MED ORDER — FENTANYL CITRATE (PF) 100 MCG/2ML IJ SOLN
50.0000 ug | Freq: Once | INTRAMUSCULAR | Status: AC
  Administered 2018-03-19: 50 ug via INTRAVENOUS
  Filled 2018-03-19: qty 2

## 2018-03-19 MED ORDER — ACETAMINOPHEN 325 MG PO TABS
650.0000 mg | ORAL_TABLET | Freq: Once | ORAL | Status: AC
  Administered 2018-03-19: 650 mg via ORAL
  Filled 2018-03-19: qty 2

## 2018-03-19 MED ORDER — CLOPIDOGREL BISULFATE 75 MG PO TABS
75.0000 mg | ORAL_TABLET | Freq: Every day | ORAL | Status: DC
  Administered 2018-03-20 – 2018-03-22 (×3): 75 mg via ORAL
  Filled 2018-03-19 (×3): qty 1

## 2018-03-19 MED ORDER — ACETAMINOPHEN 325 MG PO TABS
650.0000 mg | ORAL_TABLET | Freq: Four times a day (QID) | ORAL | Status: DC | PRN
  Administered 2018-03-20: 650 mg via ORAL
  Filled 2018-03-19: qty 2

## 2018-03-19 MED ORDER — ATORVASTATIN CALCIUM 10 MG PO TABS
10.0000 mg | ORAL_TABLET | Freq: Every evening | ORAL | Status: DC
  Administered 2018-03-19 – 2018-03-22 (×4): 10 mg via ORAL
  Filled 2018-03-19 (×5): qty 1

## 2018-03-19 MED ORDER — DM-GUAIFENESIN ER 30-600 MG PO TB12
1.0000 | ORAL_TABLET | Freq: Two times a day (BID) | ORAL | Status: DC
  Administered 2018-03-19 – 2018-03-22 (×6): 1 via ORAL
  Filled 2018-03-19 (×4): qty 1
  Filled 2018-03-19: qty 2
  Filled 2018-03-19: qty 1

## 2018-03-19 MED ORDER — ENOXAPARIN SODIUM 40 MG/0.4ML ~~LOC~~ SOLN
40.0000 mg | SUBCUTANEOUS | Status: DC
  Administered 2018-03-19 – 2018-03-21 (×3): 40 mg via SUBCUTANEOUS
  Filled 2018-03-19 (×3): qty 0.4

## 2018-03-19 MED ORDER — FOLIC ACID 1 MG PO TABS
1.0000 mg | ORAL_TABLET | Freq: Every day | ORAL | Status: DC
  Administered 2018-03-20 – 2018-03-22 (×3): 1 mg via ORAL
  Filled 2018-03-19 (×3): qty 1

## 2018-03-19 MED ORDER — PANTOPRAZOLE SODIUM 40 MG PO TBEC
40.0000 mg | DELAYED_RELEASE_TABLET | Freq: Every day | ORAL | Status: DC
  Administered 2018-03-20 – 2018-03-22 (×3): 40 mg via ORAL
  Filled 2018-03-19 (×3): qty 1

## 2018-03-19 MED ORDER — SODIUM CHLORIDE 0.9 % IV SOLN
2.0000 g | Freq: Once | INTRAVENOUS | Status: AC
  Administered 2018-03-19: 2 g via INTRAVENOUS
  Filled 2018-03-19: qty 2

## 2018-03-19 MED ORDER — IPRATROPIUM-ALBUTEROL 0.5-2.5 (3) MG/3ML IN SOLN
3.0000 mL | Freq: Once | RESPIRATORY_TRACT | Status: AC
  Administered 2018-03-19: 3 mL via RESPIRATORY_TRACT
  Filled 2018-03-19: qty 3

## 2018-03-19 MED ORDER — VANCOMYCIN HCL 10 G IV SOLR
1500.0000 mg | Freq: Once | INTRAVENOUS | Status: AC
  Administered 2018-03-19: 1500 mg via INTRAVENOUS
  Filled 2018-03-19: qty 1500

## 2018-03-19 MED ORDER — ASPIRIN EC 81 MG PO TBEC
81.0000 mg | DELAYED_RELEASE_TABLET | Freq: Every day | ORAL | Status: DC
  Administered 2018-03-20 – 2018-03-22 (×3): 81 mg via ORAL
  Filled 2018-03-19 (×3): qty 1

## 2018-03-19 MED ORDER — ONDANSETRON HCL 4 MG/2ML IJ SOLN: 4.0000 mg | Freq: Four times a day (QID) | INTRAMUSCULAR | Status: DC | PRN

## 2018-03-19 MED ORDER — ONDANSETRON HCL 4 MG PO TABS: 4.0000 mg | ORAL_TABLET | Freq: Four times a day (QID) | ORAL | Status: DC | PRN

## 2018-03-19 MED ORDER — POLYETHYLENE GLYCOL 3350 17 G PO PACK: 17.0000 g | PACK | Freq: Every day | ORAL | Status: DC | PRN

## 2018-03-19 MED ORDER — SODIUM CHLORIDE 0.9% FLUSH
3.0000 mL | Freq: Once | INTRAVENOUS | Status: AC
  Administered 2018-03-19: 3 mL via INTRAVENOUS

## 2018-03-19 MED ORDER — IPRATROPIUM-ALBUTEROL 0.5-2.5 (3) MG/3ML IN SOLN
3.0000 mL | RESPIRATORY_TRACT | Status: DC | PRN
  Filled 2018-03-19: qty 3

## 2018-03-19 MED ORDER — ALUM & MAG HYDROXIDE-SIMETH 200-200-20 MG/5ML PO SUSP
15.0000 mL | Freq: Once | ORAL | Status: AC
  Administered 2018-03-19: 15 mL via ORAL
  Filled 2018-03-19: qty 30

## 2018-03-19 MED ORDER — IPRATROPIUM-ALBUTEROL 0.5-2.5 (3) MG/3ML IN SOLN
3.0000 mL | Freq: Four times a day (QID) | RESPIRATORY_TRACT | Status: DC
  Administered 2018-03-19 – 2018-03-22 (×10): 3 mL via RESPIRATORY_TRACT
  Filled 2018-03-19 (×10): qty 3

## 2018-03-19 MED ORDER — INSULIN ASPART 100 UNIT/ML ~~LOC~~ SOLN
0.0000 [IU] | Freq: Three times a day (TID) | SUBCUTANEOUS | Status: DC
  Administered 2018-03-20: 2 [IU] via SUBCUTANEOUS
  Administered 2018-03-21 (×3): 8 [IU] via SUBCUTANEOUS
  Administered 2018-03-22: 15 [IU] via SUBCUTANEOUS
  Administered 2018-03-22: 8 [IU] via SUBCUTANEOUS

## 2018-03-19 MED ORDER — POTASSIUM CHLORIDE IN NACL 40-0.9 MEQ/L-% IV SOLN
INTRAVENOUS | Status: AC
  Administered 2018-03-19 – 2018-03-20 (×2): 125 mL/h via INTRAVENOUS
  Filled 2018-03-19 (×2): qty 1000

## 2018-03-19 MED ORDER — PNEUMOCOCCAL VAC POLYVALENT 25 MCG/0.5ML IJ INJ
0.5000 mL | INJECTION | INTRAMUSCULAR | Status: AC
  Administered 2018-03-20: 0.5 mL via INTRAMUSCULAR
  Filled 2018-03-19: qty 0.5

## 2018-03-19 MED ORDER — MOMETASONE FURO-FORMOTEROL FUM 200-5 MCG/ACT IN AERO
2.0000 | INHALATION_SPRAY | Freq: Two times a day (BID) | RESPIRATORY_TRACT | Status: DC
  Administered 2018-03-20 – 2018-03-22 (×5): 2 via RESPIRATORY_TRACT
  Filled 2018-03-19 (×2): qty 8.8

## 2018-03-19 MED ORDER — MAGNESIUM SULFATE 2 GM/50ML IV SOLN
2.0000 g | Freq: Once | INTRAVENOUS | Status: AC
  Administered 2018-03-19: 2 g via INTRAVENOUS
  Filled 2018-03-19: qty 50

## 2018-03-19 MED ORDER — ORAL CARE MOUTH RINSE
15.0000 mL | Freq: Two times a day (BID) | OROMUCOSAL | Status: DC
  Administered 2018-03-19 – 2018-03-22 (×5): 15 mL via OROMUCOSAL

## 2018-03-19 MED ORDER — SODIUM CHLORIDE 0.9 % IV BOLUS
1000.0000 mL | Freq: Once | INTRAVENOUS | Status: AC
  Administered 2018-03-19: 1000 mL via INTRAVENOUS

## 2018-03-19 MED ORDER — DOXEPIN HCL 25 MG PO CAPS
75.0000 mg | ORAL_CAPSULE | Freq: Every day | ORAL | Status: DC
  Administered 2018-03-19 – 2018-03-21 (×3): 75 mg via ORAL
  Filled 2018-03-19 (×3): qty 3

## 2018-03-19 MED ORDER — OSELTAMIVIR PHOSPHATE 75 MG PO CAPS
75.0000 mg | ORAL_CAPSULE | Freq: Once | ORAL | Status: AC
  Administered 2018-03-19: 75 mg via ORAL
  Filled 2018-03-19: qty 1

## 2018-03-19 MED ORDER — OSELTAMIVIR PHOSPHATE 75 MG PO CAPS
75.0000 mg | ORAL_CAPSULE | Freq: Two times a day (BID) | ORAL | Status: DC
  Administered 2018-03-20 – 2018-03-22 (×5): 75 mg via ORAL
  Filled 2018-03-19 (×5): qty 1

## 2018-03-19 NOTE — ED Provider Notes (Signed)
[facility] EMERGENCY DEPARTMENT Provider Note   CSN: [id number] Arrival date & time: 03/19/18  1631     History   Chief Complaint Chief Complaint  Patient presents with  . Code Sepsis    HPI JAMILE REKOWSKI is a 63 y.o. female.  HPI 63 y.o. female with medical history significant for rheumatoid arthritis, depression, DM, HTN, sleep apnea, fatty liver, asthma, resents to the ED with complaints of difficulty breathing and cough productive of whitish sputum 1 day duration.  Patient went to church on Sunday otherwise she cannot remember any specific sick contacts.  She had a flu shot this year.  No chest pains.  No leg swelling.  No vomiting or loose stools. Past Medical History:  Diagnosis Date  . Abdominal aortic stenosis   . Anal fissure   . Anxiety   . Asthma   . Complication of anesthesia   . COPD (chronic obstructive pulmonary disease) ([place])   . Depression    grief  . Diabetes mellitus   . Diverticulitis   . GERD (gastroesophageal reflux disease)   . Glaucoma   . Gout   . Hypertension   . IBS (irritable bowel syndrome)   . PONV (postoperative nausea and vomiting)   . RA (rheumatoid arthritis) ([place])   . Sleep apnea    cpap; patient has old CPAP and not using and is waiting on new one.  . Stroke ([place])    "mini-stroke"- no deficits  . TIA (transient ischemic attack)    2010. No deficits    Patient Active Problem List   Diagnosis Date Noted  . Diarrhea 01/24/2016  . Melena 11/01/2014  . Incisional hernia 03/22/2014  . Hemorrhoids 02/05/2014  . Periumbilical hernia 78/29/5621  . Fatty liver 02/05/2014  . Anorectal fissure 11/30/2013  . IBS (irritable bowel syndrome) 09/29/2013  . Heme + stool 09/29/2013  . Rectal pain 09/29/2013  . Proctalgia 01/09/2011  . Obesity 01/09/2011  . Family hx of colon cancer 01/09/2011  . Intertriginous candidiasis 01/09/2011  . GERD 11/15/2009  . Constipation 11/15/2009  . RECTAL BLEEDING 11/15/2009  . DYSPHAGIA UNSPECIFIED  11/15/2009  . EPIGASTRIC PAIN 11/15/2009    Past Surgical History:  Procedure Laterality Date  . ABDOMINAL HYSTERECTOMY  1982   complete  . BREAST LUMPECTOMY     right-benign  . CATARACT EXTRACTION W/PHACO Right 05/13/2017   Procedure: CATARACT EXTRACTION PHACO AND INTRAOCULAR LENS PLACEMENT (IOC);  Surgeon: Tonny Branch, MD;  Location: [facility];  Service: Ophthalmology;  Laterality: Right;  CDE: 8.59  . CATARACT EXTRACTION W/PHACO Left 05/27/2017   Procedure: CATARACT EXTRACTION WITH  PHACOEMULSIFICATION AND INTRAOCULAR LENS PLACEMENT LEFT EYE;  Surgeon: Tonny Branch, MD;  Location: [facility];  Service: Ophthalmology;  Laterality: Left;  CDE: 5.66  . CHOLECYSTECTOMY  2003  . COLONOSCOPY  12/14/09   Dr. Gala Romney :anal papilla and hemorrhoids,diminutive hperplastic rectal polyps/normal colon  . COLONOSCOPY N/A 10/29/2013   HYQ:MVHQ papilla and internal hemorrhoids; colonic polyps-removed as described above. I suspect benign anorectal bleeding in the setting of hemorrhoids and  possibly fissure. tubular adenoma. next TCS 10/2020.  . ELBOW SURGERY     right  . ESOPHAGOGASTRODUODENOSCOPY  12/14/09   Dr. Gala Romney :schatzkis ring 17F, otherwise normal  . ESOPHAGOGASTRODUODENOSCOPY N/A 10/29/2013   RMR: Distal esophageal pseudodiverticulum/Nissen fundoplication  . FINGER SURGERY     on right middle, pointer, and index fingers  . FISSURECTOMY     several  . INCISIONAL HERNIA REPAIR N/A 03/22/2014   Procedure: [name]  HERNIORRHAPHY;  Surgeon: Jamesetta So, MD;  Location: [facility];  Service: General;  Laterality: N/A;  . INSERTION OF MESH N/A 03/22/2014   Procedure: INSERTION OF MESH;  Surgeon: Jamesetta So, MD;  Location: [facility];  Service: General;  Laterality: N/A;  . NISSEN FUNDOPLICATION  [address] [place]  . WRIST SURGERY     left     OB History    Gravida  2   Para  2   Term  2   Preterm      AB      Living  2     SAB      TAB      Ectopic      Multiple      Live Births                Home Medications    Prior to Admission medications   Medication Sig Start Date End Date Taking? Authorizing Provider  albuterol (PROVENTIL HFA;VENTOLIN HFA) 108 (90 BASE) MCG/ACT inhaler Inhale 2 puffs into the lungs every 6 (six) hours as needed for wheezing or shortness of breath.     [provider]  albuterol (PROVENTIL) (2.5 MG/3ML) 0.083% nebulizer solution Take 2.5 mg by nebulization 3 (three) times daily as needed for wheezing or shortness of breath.    [provider]  aspirin 81 MG tablet Take 81 mg by mouth daily.    [provider]  atorvastatin (LIPITOR) 10 MG tablet Take 10 mg by mouth daily.    [provider]  budesonide-formoterol (SYMBICORT) 160-4.5 MCG/ACT inhaler Inhale 1 puff into the lungs 2 (two) times daily.     [provider]  calcium carbonate (OS-CAL) 600 MG TABS Take 600 mg by mouth 2 (two) times daily with a meal.    [provider]  clopidogrel (PLAVIX) 75 MG tablet Take 75 mg by mouth daily.      [provider]  cycloSPORINE (RESTASIS) 0.05 % ophthalmic emulsion Place 1 drop into both eyes 2 (two) times daily.      [provider]  doxepin (SINEQUAN) 75 MG capsule Take 75 mg by mouth at bedtime.      [provider]  esomeprazole (NEXIUM) 40 MG capsule Take 1 capsule (40 mg total) by mouth 2 (two) times daily before a meal. 05/12/14   Mahala Menghini, PA-C  etanercept (ENBREL) 50 MG/ML injection Inject 50 mg into the skin every Thursday.    [provider]  famotidine (PEPCID) 20 MG tablet Take 1 tablet (20 mg total) by mouth 2 (two) times daily. 01/09/18   Rolland Porter, MD  folic acid (FOLVITE) 1 MG tablet Take 1 mg by mouth daily.     [provider]  glipiZIDE (GLUCOTROL) 10 MG tablet Take 10 mg by mouth 2 (two) times daily before a meal.    [provider]  hydrocortisone (ANUSOL-HC) 2.5 % rectal cream Place 1 application rectally 2 (two)  times daily. Patient taking differently: Place 1 application rectally 2 (two) times daily as needed for hemorrhoids or anal itching.  11/01/14   Mahala Menghini, PA-C  hyoscyamine (LEVSIN SL) 0.125 MG SL tablet Place 1 tablet (0.125 mg total) under the tongue every 6 (six) hours as needed for cramping. 11/19/17   Carlis Stable, NP  ibuprofen (ADVIL,MOTRIN) 200 MG tablet Take 400 mg by mouth every 6 (six) hours as needed for headache or moderate pain.    [provider]  insulin detemir (LEVEMIR) 100 UNIT/ML injection Inject 40 Units into the skin 2 (two) times daily.     [provider]  lubiprostone (AMITIZA) 8 MCG capsule Take 1 capsule (8 mcg total) by mouth daily with breakfast. 11/19/17   Carlis Stable, NP  methotrexate (RHEUMATREX) 2.5 MG tablet Take 10 mg by mouth See admin instructions. Take 10 mg mg by mouth twice weekly on Thursday and Fridays.  Caution:Chemotherapy. Protect from light.    [provider]  metoprolol (TOPROL-XL) 100 MG 24 hr tablet Take 100 mg by mouth daily.     [provider]  Nitroglycerin (RECTIV) 0.4 % OINT Place 1 inch rectally 2 (two) times daily. Patient taking differently: Place 1 inch rectally 2 (two) times daily as needed (for itching).  11/01/14   Mahala Menghini, PA-C  predniSONE (DELTASONE) 20 MG tablet Take 2 po QD x 3d , then 1 po QD x 3d 01/09/18   Rolland Porter, MD    Family History Family History  Problem Relation Age of Onset  . Colon cancer Father 1  . Hypertension Sister   . Hypertension Brother   . Kidney disease Brother     Social History Social History   Tobacco Use  . Smoking status: Former Smoker    Packs/day: 0.25    Years: 19.00    Pack years: 4.75    Types: Cigarettes    Last attempt to quit: 01/09/2007    Years since quitting: 11.1  . Smokeless tobacco: Never Used  Substance Use Topics  . Alcohol use: No  . Drug use: No     Allergies   Aspirin; Propoxyphene n-acetaminophen; Zithromax  [azithromycin]; and Erythromycin   Review of Systems Review of Systems All systems reviewed and negative, other than as noted in HPI.   Physical Exam Updated Vital Signs BP 92/65 (BP Location: Right Arm)   Pulse (!) 113   Temp (!) 101.4 F (38.6 C) (Oral)   Resp (!) 25   Ht 5' 1.5" (1.562 m)   Wt 90.3 kg   SpO2 91%   BMI 36.99 kg/m   Physical Exam Vitals signs and nursing note reviewed.  Constitutional:      Appearance: She is well-developed. She is obese.  HENT:     Head: Normocephalic and atraumatic.  Eyes:     General:        Right eye: No discharge.        Left eye: No discharge.     Conjunctiva/sclera: Conjunctivae normal.  Neck:     Musculoskeletal: Neck supple.  Cardiovascular:     Rate and Rhythm: Regular rhythm. Tachycardia present.     Heart sounds: Normal heart sounds. No murmur. No friction rub. No gallop.   Pulmonary:     Breath sounds: Wheezing present.     Comments: tachypnea Abdominal:     General: There is no distension.     Palpations: Abdomen is soft.     Tenderness: There is no abdominal tenderness.  Musculoskeletal:        General: No tenderness.  Skin:    General: Skin is warm and dry.  Neurological:     Mental Status: She is alert.  Psychiatric:        Behavior: Behavior normal.        Thought Content: Thought content normal.      ED Treatments / Results  Labs (all labs ordered are listed, but only abnormal results are displayed) Labs Reviewed  COMPREHENSIVE METABOLIC PANEL - Abnormal; Notable for the following components:      Result Value   Potassium 3.3 (*)    BUN 7 (*)    Calcium 8.6 (*)    All other components within normal limits  CBC WITH DIFFERENTIAL/PLATELET - Abnormal; Notable for the following components:   WBC 11.6 (*)    RDW 15.9 (*)    Neutro Abs 8.3 (*)    Monocytes Absolute 1.1 (*)    All other components within normal limits  URINALYSIS, ROUTINE W REFLEX MICROSCOPIC - Abnormal; Notable for the following  components:   Color, Urine STRAW (*)    Specific Gravity, Urine 1.003 (*)    All other components within normal limits  INFLUENZA PANEL BY PCR (TYPE A & B) - Abnormal; Notable for the following components:   Influenza B By PCR POSITIVE (*)    All other components within normal limits  MAGNESIUM - Abnormal; Notable for the following components:   Magnesium 1.6 (*)    All other components within normal limits  BASIC METABOLIC PANEL - Abnormal; Notable for the following components:   Chloride 114 (*)    Glucose, Bld 142 (*)    Calcium 7.4 (*)    Anion gap 4 (*)    All other components within normal limits  CBC - Abnormal; Notable for the following components:   RBC 3.51 (*)    Hemoglobin 10.8 (*)    HCT 34.4 (*)    RDW 16.1 (*)    All other components within normal limits  GLUCOSE, CAPILLARY - Abnormal; Notable for the following components:   Glucose-Capillary 34 (*)    All other components within normal limits  GLUCOSE, CAPILLARY - Abnormal; Notable for the following components:   Glucose-Capillary 58 (*)    All other components within normal limits  GLUCOSE, CAPILLARY - Abnormal; Notable for the following components:   Glucose-Capillary 68 (*)    All other components within normal limits  GLUCOSE, CAPILLARY - Abnormal; Notable for the following components:   Glucose-Capillary 111 (*)    All other components within normal limits  GLUCOSE, CAPILLARY - Abnormal; Notable for the following components:   Glucose-Capillary 142 (*)    All other components within normal limits  GLUCOSE, CAPILLARY - Abnormal; Notable for the following components:   Glucose-Capillary 141 (*)    All other components within normal limits  GLUCOSE, CAPILLARY - Abnormal; Notable for the following components:   Glucose-Capillary 112 (*)    All other components within normal limits  CBC - Abnormal; Notable for the following components:   RDW 15.9 (*)    All other components within normal limits  BASIC  METABOLIC PANEL - Abnormal; Notable for the following components:   CO2 21 (*)    Glucose, Bld 279 (*)    Calcium 7.8 (*)    All other components within normal limits  GLUCOSE, CAPILLARY - Abnormal; Notable for the following components:   Glucose-Capillary 246 (*)    All other components within normal limits  GLUCOSE, CAPILLARY - Abnormal; Notable for the following components:   Glucose-Capillary 259 (*)    All other components within normal limits  GLUCOSE, CAPILLARY - Abnormal; Notable for the following components:   Glucose-Capillary 254 (*)    All other components within normal limits  GLUCOSE, CAPILLARY - Abnormal; Notable for the following components:   Glucose-Capillary 259 (*)    All other components within normal limits  GLUCOSE, CAPILLARY - Abnormal; Notable for  the following components:   Glucose-Capillary 363 (*)    All other components within normal limits  GLUCOSE, CAPILLARY - Abnormal; Notable for the following components:   Glucose-Capillary 300 (*)    All other components within normal limits  GLUCOSE, CAPILLARY - Abnormal; Notable for the following components:   Glucose-Capillary 367 (*)    All other components within normal limits  GLUCOSE, CAPILLARY - Abnormal; Notable for the following components:   Glucose-Capillary 276 (*)    All other components within normal limits  CG4 I-STAT (LACTIC ACID) - Abnormal; Notable for the following components:   Lactic Acid, Venous 1.93 (*)    All other components within normal limits  CULTURE, BLOOD (ROUTINE X 2)  CULTURE, BLOOD (ROUTINE X 2)  PROTIME-INR  HIV ANTIBODY (ROUTINE TESTING W REFLEX)  GLUCOSE, CAPILLARY  GLUCOSE, CAPILLARY  GLUCOSE, CAPILLARY  I-STAT CG4 LACTIC ACID, ED  I-STAT CG4 LACTIC ACID, ED  CG4 I-STAT (LACTIC ACID)    EKG EKG Interpretation  Date/Time:  Wednesday March 19 2018 16:38:48 EST Ventricular Rate:  112 PR Interval:  130 QRS Duration: 90 QT Interval:  324 QTC Calculation: 442 R  Axis:   94 Text Interpretation:  Unusual P axis and short PR, probable junctional tachycardia Rightward axis Nonspecific T wave abnormality Abnormal ECG Confirmed by Orpah Greek [id number]) on 03/20/2018 5:07:02 PM   Radiology Dg Chest 2 View  Result Date: 03/19/2018 CLINICAL DATA:  63 year old female with shortness of breath, productive cough and chest tightness for the past week EXAM: CHEST - 2 VIEW COMPARISON:  Prior chest x-ray 07/12/2016 FINDINGS: Cardiac and mediastinal contours remain unchanged and within normal limits. Atherosclerotic calcifications are present in the transverse aorta. Similar appearance of the lungs with hyperinflation, chronic bronchitic changes in diffuse mild interstitial prominence. No new focal airspace opacification, pulmonary edema, pleural effusion or pneumothorax. Surgical clips in the right upper quadrant suggest prior cholecystectomy. No acute osseous abnormality. IMPRESSION: 1. Stable chest x-ray without evidence of acute cardiopulmonary process. 2. Chronic pulmonary parenchymal changes consistent with COPD. 3.  Aortic Atherosclerosis (ICD10-170.0) Electronically Signed   By: Jacqulynn Cadet M.D.   On: 03/19/2018 16:55    Procedures Procedures (including critical care time)  Medications Ordered in ED Medications  0.9 % NaCl with KCl 40 mEq / L  infusion (0 mL/hr Intravenous Hold 03/21/18 1400)  0.9 % NaCl with KCl 40 mEq / L  infusion (0 mL/hr Intravenous Hold 03/21/18 1300)  sodium chloride flush (NS) 0.9 % injection 3 mL (3 mLs Intravenous Given 03/19/18 1730)  ceFEPIme (MAXIPIME) 2 g in sodium chloride 0.9 % 100 mL IVPB (0 g Intravenous Stopped 03/19/18 1846)  vancomycin (VANCOCIN) 1,500 mg in sodium chloride 0.9 % 500 mL IVPB (1,500 mg Intravenous New Bag/Given 03/19/18 1846)  acetaminophen (TYLENOL) tablet 650 mg (650 mg Oral Given 03/19/18 1729)  ipratropium-albuterol (DUONEB) 0.5-2.5 (3) MG/3ML nebulizer solution 3 mL (3 mLs Nebulization Given  03/19/18 1735)  fentaNYL (SUBLIMAZE) injection 50 mcg (50 mcg Intravenous Given 03/19/18 1730)  oseltamivir (TAMIFLU) capsule 75 mg (75 mg Oral Given 03/19/18 1855)  sodium chloride 0.9 % bolus 1,000 mL (1,000 mLs Intravenous New Bag/Given 03/19/18 2013)  alum & mag hydroxide-simeth (MAALOX/MYLANTA) 200-200-20 MG/5ML suspension 15 mL (15 mLs Oral Given 03/19/18 2253)  pneumococcal 23 valent vaccine (PNU-IMMUNE) injection 0.5 mL (0.5 mLs Intramuscular Given 03/20/18 1049)  magnesium sulfate IVPB 2 g 50 mL (2 g Intravenous New Bag/Given 03/19/18 2259)  ALPRAZolam (XANAX) tablet 0.5 mg (0.5 mg Oral  Given 03/21/18 0134)  traZODone (DESYREL) tablet 50 mg (50 mg Oral Given 03/21/18 2243)     Initial Impression / Assessment and Plan / ED Course  I have reviewed the triage vital signs and the nursing notes.  Pertinent labs & imaging results that were available during my care of the patient were reviewed by me and considered in my medical decision making (see chart for details).     62yF with cough and dyspnea. Influenza positive in setting of chronic lung disease. New o2 requirement and hypotension. abx in addition to tamiflu to cover for the time being. Admit for further management.   Final Clinical Impressions(s) / ED Diagnoses   Final diagnoses:  Influenza B  Hypoxemia    ED Discharge Orders    None       Virgel Manifold, MD 03/29/18 [phone]

## 2018-03-19 NOTE — H&P (Signed)
History and Physical    Brittney Martinez:[id number] DOB: Apr 28, 1955 DOA: 03/19/2018  PCP: Glenda Chroman, MD   Patient coming from: Home  I have personally briefly reviewed patient's old medical records in [place]  Chief Complaint: SOB, Cough  HPI: Brittney Martinez is a 63 y.o. female with medical history significant for rheumatoid arthritis, depression, DM, HTN, sleep apnea, fatty liver, asthma, resents to the ED with complaints of difficulty breathing and cough productive of whitish sputum 1 day duration.  Patient went to church on Sunday otherwise she cannot remember any specific sick contacts.  She had a flu shot this year.  No chest pains.  No leg swelling.  No vomiting or loose stools.  ED Course: T-max 102.9, hypotensive to 86/47, initial tachycardia 113, O2 sats 91% on room air requiring 2 L O2 via nasal cannula.  Influenza B+.  UA clean.  WBC 11.6.  K mildly low 3.3.  Lactic acid 1.9>> 0.89.  Two-view chest x-ray chronic changes consistent with COPD.  With initial concern for sepsis patient was started on IV Vanco and cefepime.  Tamiflu , 1 L bolus given.  Hospitalist to admit for influenza with O2 requirement and hypotension.  Review of Systems: As per HPI all other systems reviewed and negative.  Past Medical History:  Diagnosis Date  . Abdominal aortic stenosis   . Anal fissure   . Anxiety   . Asthma   . Complication of anesthesia   . COPD (chronic obstructive pulmonary disease) ([place])   . Depression    grief  . Diabetes mellitus   . Diverticulitis   . GERD (gastroesophageal reflux disease)   . Glaucoma   . Gout   . Hypertension   . IBS (irritable bowel syndrome)   . PONV (postoperative nausea and vomiting)   . RA (rheumatoid arthritis) ([place])   . Sleep apnea    cpap; patient has old CPAP and not using and is waiting on new one.  . Stroke ([place])    "mini-stroke"- no deficits  . TIA (transient ischemic attack)    2010. No deficits    Past Surgical History:    Procedure Laterality Date  . ABDOMINAL HYSTERECTOMY  1982   complete  . BREAST LUMPECTOMY     right-benign  . CATARACT EXTRACTION W/PHACO Right 05/13/2017   Procedure: CATARACT EXTRACTION PHACO AND INTRAOCULAR LENS PLACEMENT (IOC);  Surgeon: Tonny Branch, MD;  Location: [facility];  Service: Ophthalmology;  Laterality: Right;  CDE: 8.59  . CATARACT EXTRACTION W/PHACO Left 05/27/2017   Procedure: CATARACT EXTRACTION WITH  PHACOEMULSIFICATION AND INTRAOCULAR LENS PLACEMENT LEFT EYE;  Surgeon: Tonny Branch, MD;  Location: [facility];  Service: Ophthalmology;  Laterality: Left;  CDE: 5.66  . CHOLECYSTECTOMY  2003  . COLONOSCOPY  12/14/09   Dr. Gala Romney :anal papilla and hemorrhoids,diminutive hperplastic rectal polyps/normal colon  . COLONOSCOPY N/A 10/29/2013   GEZ:MOQH papilla and internal hemorrhoids; colonic polyps-removed as described above. I suspect benign anorectal bleeding in the setting of hemorrhoids and  possibly fissure. tubular adenoma. next TCS 10/2020.  . ELBOW SURGERY     right  . ESOPHAGOGASTRODUODENOSCOPY  12/14/09   Dr. Gala Romney :schatzkis ring 28F, otherwise normal  . ESOPHAGOGASTRODUODENOSCOPY N/A 10/29/2013   RMR: Distal esophageal pseudodiverticulum/Nissen fundoplication  . FINGER SURGERY     on right middle, pointer, and index fingers  . FISSURECTOMY     several  . INCISIONAL HERNIA REPAIR N/A 03/22/2014   Procedure: INCISIONAL HERNIORRHAPHY;  Surgeon: Jeannette How  Arnoldo Morale, MD;  Location: [facility];  Service: General;  Laterality: N/A;  . INSERTION OF MESH N/A 03/22/2014   Procedure: INSERTION OF MESH;  Surgeon: Jamesetta So, MD;  Location: [facility];  Service: General;  Laterality: N/A;  . NISSEN FUNDOPLICATION  [id number]   DeMason Beason  . WRIST SURGERY     left     reports that she quit smoking about 11 years ago. Her smoking use included cigarettes. She has a 4.75 pack-year smoking history. She has never used smokeless tobacco. She reports that she does not drink alcohol or use  drugs.  Allergies  Allergen Reactions  . Aspirin Nausea Only  . Propoxyphene N-Acetaminophen Nausea And Vomiting  . Zithromax [Azithromycin] Nausea Only  . Erythromycin Rash    Family History  Problem Relation Age of Onset  . Colon cancer Father 8  . Hypertension Sister   . Hypertension Brother   . Kidney disease Brother     Prior to Admission medications   Medication Sig Start Date End Date Taking? Authorizing Provider  albuterol (PROVENTIL HFA;VENTOLIN HFA) 108 (90 BASE) MCG/ACT inhaler Inhale 2 puffs into the lungs every 6 (six) hours as needed for wheezing or shortness of breath.    Yes [provider]  albuterol (PROVENTIL) (2.5 MG/3ML) 0.083% nebulizer solution Take 2.5 mg by nebulization 3 (three) times daily as needed for wheezing or shortness of breath.   Yes [provider]  amLODipine (NORVASC) 5 MG tablet Take 5 mg by mouth every morning.  03/08/18  Yes [provider]  aspirin 81 MG tablet Take 81 mg by mouth daily.   Yes [provider]  atorvastatin (LIPITOR) 10 MG tablet Take 10 mg by mouth every evening.    Yes [provider]  budesonide-formoterol (SYMBICORT) 160-4.5 MCG/ACT inhaler Inhale 1 puff into the lungs 2 (two) times daily.    Yes [provider]  calcium carbonate (OS-CAL) 600 MG TABS Take 600 mg by mouth 2 (two) times daily with a meal.   Yes [provider]  clopidogrel (PLAVIX) 75 MG tablet Take 75 mg by mouth daily.     Yes [provider]  cycloSPORINE (RESTASIS) 0.05 % ophthalmic emulsion Place 1 drop into both eyes 2 (two) times daily.     Yes [provider]  doxepin (SINEQUAN) 75 MG capsule Take 75 mg by mouth at bedtime.     Yes [provider]  esomeprazole (NEXIUM) 40 MG capsule Take 1 capsule (40 mg total) by mouth 2 (two) times daily before a meal. Patient taking differently: Take 40 mg by mouth at bedtime.  05/12/14  Yes Mahala Menghini, PA-C  etanercept  (ENBREL) 50 MG/ML injection Inject 50 mg into the skin every Thursday.   Yes [provider]  folic acid (FOLVITE) 1 MG tablet Take 1 mg by mouth daily.    Yes [provider]  glipiZIDE (GLUCOTROL) 10 MG tablet Take 10 mg by mouth 2 (two) times daily before a meal.   Yes [provider]  HYDROcodone-homatropine (HYCODAN) 5-1.5 MG/5ML syrup Take 5 mLs by mouth every 6 (six) hours as needed for cough.  01/04/18  Yes [provider]  hydrocortisone (ANUSOL-HC) 2.5 % rectal cream Place 1 application rectally 2 (two) times daily. Patient taking differently: Place 1 application rectally 2 (two) times daily as needed for hemorrhoids or anal itching.  11/01/14  Yes Mahala Menghini, PA-C  hyoscyamine (LEVSIN SL) 0.125 MG SL tablet Place 1 tablet (  0.125 mg total) under the tongue every 6 (six) hours as needed for cramping. 11/19/17  Yes Carlis Stable, NP  ibuprofen (ADVIL,MOTRIN) 200 MG tablet Take 400 mg by mouth every 6 (six) hours as needed for headache or moderate pain.   Yes [provider]  insulin detemir (LEVEMIR) 100 UNIT/ML injection Inject 40 Units into the skin 2 (two) times daily.    Yes [provider]  lubiprostone (AMITIZA) 8 MCG capsule Take 1 capsule (8 mcg total) by mouth daily with breakfast. Patient taking differently: Take 8 mcg by mouth every other day.  11/19/17  Yes Carlis Stable, NP  methotrexate (RHEUMATREX) 2.5 MG tablet Take 10 mg by mouth See admin instructions. Take 10 mg mg by mouth twice weekly on Thursday and Fridays.  Caution:Chemotherapy. Protect from light.   Yes [provider]  metoprolol (TOPROL-XL) 100 MG 24 hr tablet Take 100 mg by mouth every morning.    Yes [provider]  Nitroglycerin (RECTIV) 0.4 % OINT Place 1 inch rectally 2 (two) times daily. Patient taking differently: Place 1 inch rectally 2 (two) times daily as needed (for itching).  11/01/14  Yes Mahala Menghini, PA-C  famotidine (PEPCID) 20  MG tablet Take 1 tablet (20 mg total) by mouth 2 (two) times daily. Patient not taking: Reported on 03/19/2018 01/09/18   Rolland Porter, MD  predniSONE (DELTASONE) 20 MG tablet Take 2 po QD x 3d , then 1 po QD x 3d Patient not taking: Reported on 03/19/2018 01/09/18   Rolland Porter, MD    Physical Exam: Vitals:   03/19/18 1848 03/19/18 1859 03/19/18 1900 03/19/18 2000  BP: (!) 86/47  122/88 107/77  Pulse: 87  84 79  Resp:      Temp:  (!) 101.5 F (38.6 C)    TempSrc:  Oral    SpO2: 90%  94% 92%  Weight:      Height:        Constitutional: NAD, calm, comfortable Vitals:   03/19/18 1848 03/19/18 1859 03/19/18 1900 03/19/18 2000  BP: (!) 86/47  122/88 107/77  Pulse: 87  84 79  Resp:      Temp:  (!) 101.5 F (38.6 C)    TempSrc:  Oral    SpO2: 90%  94% 92%  Weight:      Height:       Eyes: PERRL, lids and conjunctivae normal ENMT: Mucous membranes are very dry. Posterior pharynx clear of any exudate or lesions. Neck: normal, supple, no masses, no thyromegaly Respiratory: clear to auscultation bilaterally, no wheezing, no crackles. Normal respiratory effort. No accessory muscle use. On 2L [place]. Cardiovascular: Regular rate and rhythm, no murmurs / rubs / gallops. No extremity edema. 2+ pedal pulses.  Abdomen: no tenderness, no masses palpated. No hepatosplenomegaly. Bowel sounds positive.  Musculoskeletal: no clubbing / cyanosis. No joint deformity upper and lower extremities. Good ROM, no contractures. Normal muscle tone.  Skin: no rashes, lesions, ulcers. No induration Neurologic: CN 2-12 grossly intact.  Strength 5/5 in all 4.  Psychiatric: Normal judgment and insight. Alert and oriented x 3. Normal mood.   Labs on Admission: I have personally reviewed following labs and imaging studies  CBC: Recent Labs  Lab 03/19/18 1704  WBC 11.6*  NEUTROABS 8.3*  HGB 13.5  HCT 43.1  MCV 97.3  PLT 628   Basic Metabolic Panel: Recent Labs  Lab 03/19/18 1704  NA 136  K 3.3*  CL 103   CO2 22  GLUCOSE 97  BUN 7*  CREATININE 0.80  CALCIUM 8.6*   GFR: Estimated Creatinine Clearance: 73.5 mL/min (by C-G formula based on SCr of 0.8 mg/dL). Liver Function Tests: Recent Labs  Lab 03/19/18 1704  AST 34  ALT 36  ALKPHOS 75  BILITOT 0.7  PROT 7.7  ALBUMIN 3.7   Coagulation Profile: Recent Labs  Lab 03/19/18 1704  INR 1.04   Urine analysis:    Component Value Date/Time   COLORURINE STRAW (A) 03/19/2018 1722   APPEARANCEUR CLEAR 03/19/2018 1722   LABSPEC 1.003 (L) 03/19/2018 1722   PHURINE 7.0 03/19/2018 1722   GLUCOSEU NEGATIVE 03/19/2018 1722   HGBUR NEGATIVE 03/19/2018 1722   BILIRUBINUR NEGATIVE 03/19/2018 1722   KETONESUR NEGATIVE 03/19/2018 1722   PROTEINUR NEGATIVE 03/19/2018 1722   UROBILINOGEN 0.2 10/01/2010 1245   NITRITE NEGATIVE 03/19/2018 1722   LEUKOCYTESUR NEGATIVE 03/19/2018 1722    Radiological Exams on Admission: Dg Chest 2 View  Result Date: 03/19/2018 CLINICAL DATA:  63 year old female with shortness of breath, productive cough and chest tightness for the past week EXAM: CHEST - 2 VIEW COMPARISON:  Prior chest x-ray 07/12/2016 FINDINGS: Cardiac and mediastinal contours remain unchanged and within normal limits. Atherosclerotic calcifications are present in the transverse aorta. Similar appearance of the lungs with hyperinflation, chronic bronchitic changes in diffuse mild interstitial prominence. No new focal airspace opacification, pulmonary edema, pleural effusion or pneumothorax. Surgical clips in the right upper quadrant suggest prior cholecystectomy. No acute osseous abnormality. IMPRESSION: 1. Stable chest x-ray without evidence of acute cardiopulmonary process. 2. Chronic pulmonary parenchymal changes consistent with COPD. 3.  Aortic Atherosclerosis (ICD10-170.0) Electronically Signed   By: Jacqulynn Cadet M.D.   On: 03/19/2018 16:55    EKG: None  Assessment/Plan Active Problems:   Influenza B  Influenza B with  sepsis-febrile, initial tachycardia, hypotensive to 86/47. Lactic acid 1.9 > 0.89.  2 L O2 requirement.  Patient immunocompromised on Enbrel and methotrexate.  Not on chronic steroids.  X-ray chronic parenchymal change consistent with COPD.  UA not suggestive of infection. -Continue Tamiflu -IV N/s 125cc/hr X 15 hrs -Follow-up blood cultures drawn in ED -BMP, CBC a.m  Mild hypokalemia- K- 3.3. -Replete -Check magnesium  Rheumatoid arthritis-stable -Hold immunosuppressants Enbrel and methotrexate in the setting of acute infection.  DM-glucose 97.  -Hold home long-acting insulin, glipizide - SSI  HTN-hypotensive to 86/47. -Hold home Norvasc, metoprolol  CVA- > 10 years ago.  No residual deficits. -Continue aspirin and Plavix  OSA- on CPAP at home - CPAP   DVT prophylaxis: Lovenox Code Status: Full Family Communication: Family at bedside Disposition Plan: Per rounding team Consults called: None Admission status: Obs, tele   Bethena Roys MD [facility]  03/19/2018, 8:10 PM

## 2018-03-19 NOTE — ED Triage Notes (Signed)
Patient reports SOB and productive cough that started on Tuesday.

## 2018-03-20 DIAGNOSIS — E119 Type 2 diabetes mellitus without complications: Secondary | ICD-10-CM

## 2018-03-20 DIAGNOSIS — G4733 Obstructive sleep apnea (adult) (pediatric): Secondary | ICD-10-CM

## 2018-03-20 DIAGNOSIS — J9601 Acute respiratory failure with hypoxia: Secondary | ICD-10-CM | POA: Diagnosis not present

## 2018-03-20 DIAGNOSIS — Z9989 Dependence on other enabling machines and devices: Secondary | ICD-10-CM

## 2018-03-20 DIAGNOSIS — J209 Acute bronchitis, unspecified: Secondary | ICD-10-CM | POA: Diagnosis present

## 2018-03-20 DIAGNOSIS — I10 Essential (primary) hypertension: Secondary | ICD-10-CM | POA: Diagnosis not present

## 2018-03-20 DIAGNOSIS — E876 Hypokalemia: Secondary | ICD-10-CM | POA: Diagnosis present

## 2018-03-20 DIAGNOSIS — J440 Chronic obstructive pulmonary disease with acute lower respiratory infection: Secondary | ICD-10-CM | POA: Diagnosis not present

## 2018-03-20 DIAGNOSIS — J101 Influenza due to other identified influenza virus with other respiratory manifestations: Secondary | ICD-10-CM | POA: Diagnosis not present

## 2018-03-20 DIAGNOSIS — M069 Rheumatoid arthritis, unspecified: Secondary | ICD-10-CM | POA: Diagnosis present

## 2018-03-20 LAB — BASIC METABOLIC PANEL
Anion gap: 4 — ABNORMAL LOW (ref 5–15)
BUN: 8 mg/dL (ref 8–23)
CO2: 22 mmol/L (ref 22–32)
Calcium: 7.4 mg/dL — ABNORMAL LOW (ref 8.9–10.3)
Chloride: 114 mmol/L — ABNORMAL HIGH (ref 98–111)
Creatinine, Ser: 0.58 mg/dL (ref 0.44–1.00)
GFR calc Af Amer: >60 mL/min (ref >60)
GFR calc non Af Amer: >60 mL/min (ref >60)
Glucose, Bld: 142 mg/dL — ABNORMAL HIGH (ref 70–99)
Potassium: 3.6 mmol/L (ref 3.5–5.1)
Sodium: 140 mmol/L (ref 135–145)

## 2018-03-20 LAB — GLUCOSE, CAPILLARY
Glucose-Capillary: 111 mg/dL — ABNORMAL HIGH (ref 70–99)
Glucose-Capillary: 68 mg/dL — ABNORMAL LOW (ref 70–99)
Glucose-Capillary: 89 mg/dL (ref 70–99)
Glucose-Capillary: 142 mg/dL — ABNORMAL HIGH (ref 70–99)
Glucose-Capillary: 141 mg/dL — ABNORMAL HIGH (ref 70–99)
Glucose-Capillary: 112 mg/dL — ABNORMAL HIGH (ref 70–99)
Glucose-Capillary: 78 mg/dL (ref 70–99)
Glucose-Capillary: 246 mg/dL — ABNORMAL HIGH (ref 70–99)

## 2018-03-20 LAB — CBC
HCT: 34.4 % — ABNORMAL LOW (ref 36.0–46.0)
Hemoglobin: 10.8 g/dL — ABNORMAL LOW (ref 12.0–15.0)
MCH: 30.8 pg (ref 26.0–34.0)
MCHC: 31.4 g/dL (ref 30.0–36.0)
MCV: 98 fL (ref 80.0–100.0)
Platelets: 195 10*3/uL (ref 150–400)
RBC: 3.51 MIL/uL — ABNORMAL LOW (ref 3.87–5.11)
RDW: 16.1 % — ABNORMAL HIGH (ref 11.5–15.5)
WBC: 8.8 10*3/uL (ref 4.0–10.5)
nRBC: 0 % (ref 0.0–0.2)

## 2018-03-20 MED ORDER — POTASSIUM CHLORIDE IN NACL 40-0.9 MEQ/L-% IV SOLN
INTRAVENOUS | Status: AC
  Administered 2018-03-20 – 2018-03-21 (×2): 125 mL/h via INTRAVENOUS

## 2018-03-20 MED ORDER — METHYLPREDNISOLONE SODIUM SUCC 125 MG IJ SOLR
60.0000 mg | Freq: Two times a day (BID) | INTRAMUSCULAR | Status: DC
  Administered 2018-03-20 – 2018-03-22 (×4): 60 mg via INTRAVENOUS
  Filled 2018-03-20 (×4): qty 2

## 2018-03-20 NOTE — Care Management Obs Status (Signed)
[place] NOTIFICATION   Patient Details  Name: Brittney Martinez MRN: [id number] Date of Birth: September 16, 1955   Medicare Observation Status Notification Given:  Yes    Sherald Barge, RN 03/20/2018, 3:21 PM

## 2018-03-20 NOTE — Progress Notes (Signed)
CBG now is 89 mg/dL. Dr. Olevia Bowens is aware without any new orders. Will continue to monitor.

## 2018-03-20 NOTE — Progress Notes (Signed)
Patient's CBG dropped to 34mg /dL on admission. Per her statement she hadn't eaten for about 12 hours. Pt. Offered food to eat and rechecked came to 58, 74 and rechecked at midnight was 111. CBG checked this morning is 68. Patient offered snack to eat per hypoglycemic protocol. She's asymptomatic. Paged Dr. Olevia Bowens to notify via text. Will recheck in 15 minutes and continue to monitor.

## 2018-03-20 NOTE — Progress Notes (Signed)
PROGRESS NOTE    Brittney Martinez  WUX:[id number] DOB: [birth year] DOA: 03/19/2018 PCP: Glenda Chroman, MD    Brief Narrative:  63 year old female with history of rheumatoid arthritis, hypertension, diabetes, presents to the hospital with shortness of breath and cough.  She is found be febrile with a temperature of 102.9.  She was hypotensive with a blood pressure of 86/47.  She was found to have influenza B positive likely precipitating COPD exacerbation with acute respiratory failure.   Assessment & Plan:   Active Problems:   Diarrhea   Influenza B   Rheumatoid arthritis (HCC)   Hypokalemia   Type 2 diabetes mellitus without complication ([facility])   COPD with acute bronchitis ([facility])   Acute respiratory failure with hypoxia ([facility])   1. Acute respiratory failure with hypoxia.  Still requiring supplemental oxygen.  She was hypoxic on room air.  Likely related to COPD exacerbation/influenza.  Wean off oxygen as tolerated. 2. COPD exacerbation with bronchitis.  Continue supportive treatment with bronchodilators.  Continues to wheeze, will start on steroids. 3. Influenza B.  Continue on Tamiflu.  Overall fevers appear to be better. 4. Diarrhea.  Suspect this is related to viral illness.  Check stool studies. 5. Rheumatoid arthritis.  On chronic immunosuppressive therapy.  She is chronically on Enbrel and methotrexate which are currently on hold. 6. Diabetes.  Blood sugar is stable.  Continue on sliding scale insulin.  Will likely have high blood sugars in the setting of steroids. 7. History of hypertension on Norvasc and metoprolol.  Antihypertensives currently on hold since she was hypotensive on arrival. 8. History of CVA.  She is chronically on aspirin and Plavix. 9. Obstructive sleep apnea.  Continue on CPAP.   DVT prophylaxis: Lovenox Code Status: Full code Family Communication: No family present Disposition Plan: Discharge home once improved   Consultants:     Procedures:      Antimicrobials:      Subjective: Reports having 5 loose bowel movements today.  Has a productive cough.  Continues to have wheezing.  Objective: Vitals:   03/20/18 0736 03/20/18 0739 03/20/18 1403 03/20/18 1527  BP:   109/73   Pulse:   78   Resp:   20   Temp:   98.3 F (36.8 C)   TempSrc:   Oral   SpO2: 92% 92% 100% 95%  Weight:      Height:        Intake/Output Summary (Last 24 hours) at 03/20/2018 1620 Last data filed at 03/20/2018 1505 Gross per 24 hour  Intake 1674.08 ml  Output --  Net 1674.08 ml   Filed Weights   03/19/18 1637  Weight: 90.3 kg    Examination:  General exam: Appears calm and comfortable  Respiratory system: Bilateral rhonchi and wheezing. Respiratory effort normal. Cardiovascular system: S1 & S2 heard, RRR. No JVD, murmurs, rubs, gallops or clicks. No pedal edema. Gastrointestinal system: Abdomen is nondistended, soft and nontender. No organomegaly or masses felt. Normal bowel sounds heard. Central nervous system: Alert and oriented. No focal neurological deficits. Extremities: Symmetric 5 x 5 power. Skin: No rashes, lesions or ulcers Psychiatry: Judgement and insight appear normal. Mood & affect appropriate.     Data Reviewed: I have personally reviewed following labs and imaging studies  CBC: Recent Labs  Lab 03/19/18 1704 03/20/18 0533  WBC 11.6* 8.8  NEUTROABS 8.3*  --   HGB 13.5 10.8*  HCT 43.1 34.4*  MCV 97.3 98.0  PLT 274 195   Basic  Metabolic Panel: Recent Labs  Lab 03/19/18 1704 03/20/18 0533  NA 136 140  K 3.3* 3.6  CL 103 114*  CO2 22 22  GLUCOSE 97 142*  BUN 7* 8  CREATININE 0.80 0.58  CALCIUM 8.6* 7.4*  MG 1.6*  --    GFR: Estimated Creatinine Clearance: 73.5 mL/min (by C-G formula based on SCr of 0.58 mg/dL). Liver Function Tests: Recent Labs  Lab 03/19/18 1704  AST 34  ALT 36  ALKPHOS 75  BILITOT 0.7  PROT 7.7  ALBUMIN 3.7   No results for input(s): LIPASE, AMYLASE in the last 168  hours. No results for input(s): AMMONIA in the last 168 hours. Coagulation Profile: Recent Labs  Lab 03/19/18 1704  INR 1.04   Cardiac Enzymes: No results for input(s): CKTOTAL, CKMB, CKMBINDEX, TROPONINI in the last 168 hours. BNP (last 3 results) No results for input(s): PROBNP in the last 8760 hours. HbA1C: No results for input(s): HGBA1C in the last 72 hours. CBG: Recent Labs  Lab 03/20/18 0336 03/20/18 0412 03/20/18 0641 03/20/18 0745 03/20/18 1104  GLUCAP 68* 89 142* 141* 112*   Lipid Profile: No results for input(s): CHOL, HDL, LDLCALC, TRIG, CHOLHDL, LDLDIRECT in the last 72 hours. Thyroid Function Tests: No results for input(s): TSH, T4TOTAL, FREET4, T3FREE, THYROIDAB in the last 72 hours. Anemia Panel: No results for input(s): VITAMINB12, FOLATE, FERRITIN, TIBC, IRON, RETICCTPCT in the last 72 hours. Sepsis Labs: Recent Labs  Lab 03/19/18 1714 03/19/18 1905  LATICACIDVEN 1.93* 0.89    Recent Results (from the past 240 hour(s))  Culture, blood (Routine x 2)     Status: None (Preliminary result)   Collection Time: 03/19/18  5:04 PM  Result Value Ref Range Status   Specimen Description RIGHT ANTECUBITAL  Final   Special Requests   Final    BOTTLES DRAWN AEROBIC AND ANAEROBIC Blood Culture adequate volume   Culture   Final    NO GROWTH < 24 HOURS Performed at [facility], [address] [place], [place] [address]    Report Status PENDING  Incomplete  Culture, blood (Routine x 2)     Status: None (Preliminary result)   Collection Time: 03/19/18  5:21 PM  Result Value Ref Range Status   Specimen Description LEFT ANTECUBITAL  Final   Special Requests   Final    BOTTLES DRAWN AEROBIC ONLY Blood Culture adequate volume   Culture   Final    NO GROWTH < 24 HOURS Performed at [facility], [address] [place], [place] [address]    Report Status PENDING  Incomplete         Radiology Studies: Dg Chest 2 View  Result Date: 03/19/2018 CLINICAL DATA:   63 year old female with shortness of breath, productive cough and chest tightness for the past week EXAM: CHEST - 2 VIEW COMPARISON:  Prior chest x-ray 07/12/2016 FINDINGS: Cardiac and mediastinal contours remain unchanged and within normal limits. Atherosclerotic calcifications are present in the transverse aorta. Similar appearance of the lungs with hyperinflation, chronic bronchitic changes in diffuse mild interstitial prominence. No new focal airspace opacification, pulmonary edema, pleural effusion or pneumothorax. Surgical clips in the right upper quadrant suggest prior cholecystectomy. No acute osseous abnormality. IMPRESSION: 1. Stable chest x-ray without evidence of acute cardiopulmonary process. 2. Chronic pulmonary parenchymal changes consistent with COPD. 3.  Aortic Atherosclerosis (ICD10-170.0) Electronically Signed   By: Jacqulynn Cadet M.D.   On: 03/19/2018 16:55        Scheduled Meds: . aspirin EC  81 mg Oral Daily  . atorvastatin  10 mg Oral QPM  . clopidogrel  75 mg Oral Daily  . dextromethorphan-guaiFENesin  1 tablet Oral BID  . doxepin  75 mg Oral QHS  . enoxaparin (LOVENOX) injection  40 mg Subcutaneous Q24H  . folic acid  1 mg Oral Daily  . insulin aspart  0-15 Units Subcutaneous TID WC  . ipratropium-albuterol  3 mL Nebulization Q6H  . mouth rinse  15 mL Mouth Rinse BID  . methylPREDNISolone (SOLU-MEDROL) injection  60 mg Intravenous Q12H  . mometasone-formoterol  2 puff Inhalation BID  . oseltamivir  75 mg Oral BID  . pantoprazole  40 mg Oral Daily   Continuous Infusions:   LOS: 0 days    Time spent: 71mins    Kathie Dike, MD [facility]   If 7PM-7AM, please contact night-coverage [email or web address]  03/20/2018, 4:20 PM

## 2018-03-20 NOTE — Progress Notes (Signed)
Dr. Olevia Bowens responded back stated to check CBG again and if there's a need for dextrose infusion, then we'll initiate it. Will do and continue to monitor.

## 2018-03-20 NOTE — Progress Notes (Signed)
CBG rechecked is 142 mg/dL

## 2018-03-21 DIAGNOSIS — K580 Irritable bowel syndrome with diarrhea: Secondary | ICD-10-CM | POA: Diagnosis present

## 2018-03-21 DIAGNOSIS — F419 Anxiety disorder, unspecified: Secondary | ICD-10-CM | POA: Diagnosis present

## 2018-03-21 DIAGNOSIS — Z90710 Acquired absence of both cervix and uterus: Secondary | ICD-10-CM | POA: Diagnosis not present

## 2018-03-21 DIAGNOSIS — K219 Gastro-esophageal reflux disease without esophagitis: Secondary | ICD-10-CM | POA: Diagnosis present

## 2018-03-21 DIAGNOSIS — I350 Nonrheumatic aortic (valve) stenosis: Secondary | ICD-10-CM | POA: Diagnosis present

## 2018-03-21 DIAGNOSIS — G4733 Obstructive sleep apnea (adult) (pediatric): Secondary | ICD-10-CM | POA: Diagnosis present

## 2018-03-21 DIAGNOSIS — J441 Chronic obstructive pulmonary disease with (acute) exacerbation: Secondary | ICD-10-CM | POA: Diagnosis present

## 2018-03-21 DIAGNOSIS — E119 Type 2 diabetes mellitus without complications: Secondary | ICD-10-CM | POA: Diagnosis present

## 2018-03-21 DIAGNOSIS — M069 Rheumatoid arthritis, unspecified: Secondary | ICD-10-CM | POA: Diagnosis present

## 2018-03-21 DIAGNOSIS — J209 Acute bronchitis, unspecified: Secondary | ICD-10-CM | POA: Diagnosis present

## 2018-03-21 DIAGNOSIS — M109 Gout, unspecified: Secondary | ICD-10-CM | POA: Diagnosis present

## 2018-03-21 DIAGNOSIS — Z961 Presence of intraocular lens: Secondary | ICD-10-CM | POA: Diagnosis present

## 2018-03-21 DIAGNOSIS — A419 Sepsis, unspecified organism: Secondary | ICD-10-CM | POA: Diagnosis present

## 2018-03-21 DIAGNOSIS — F329 Major depressive disorder, single episode, unspecified: Secondary | ICD-10-CM | POA: Diagnosis present

## 2018-03-21 DIAGNOSIS — J9601 Acute respiratory failure with hypoxia: Secondary | ICD-10-CM | POA: Diagnosis present

## 2018-03-21 DIAGNOSIS — Z9841 Cataract extraction status, right eye: Secondary | ICD-10-CM | POA: Diagnosis not present

## 2018-03-21 DIAGNOSIS — J440 Chronic obstructive pulmonary disease with acute lower respiratory infection: Secondary | ICD-10-CM | POA: Diagnosis present

## 2018-03-21 DIAGNOSIS — H409 Unspecified glaucoma: Secondary | ICD-10-CM | POA: Diagnosis present

## 2018-03-21 DIAGNOSIS — Z8673 Personal history of transient ischemic attack (TIA), and cerebral infarction without residual deficits: Secondary | ICD-10-CM | POA: Diagnosis not present

## 2018-03-21 DIAGNOSIS — Z9842 Cataract extraction status, left eye: Secondary | ICD-10-CM | POA: Diagnosis not present

## 2018-03-21 DIAGNOSIS — J101 Influenza due to other identified influenza virus with other respiratory manifestations: Secondary | ICD-10-CM | POA: Diagnosis present

## 2018-03-21 DIAGNOSIS — I10 Essential (primary) hypertension: Secondary | ICD-10-CM | POA: Diagnosis present

## 2018-03-21 DIAGNOSIS — K760 Fatty (change of) liver, not elsewhere classified: Secondary | ICD-10-CM | POA: Diagnosis present

## 2018-03-21 DIAGNOSIS — Z23 Encounter for immunization: Secondary | ICD-10-CM | POA: Diagnosis present

## 2018-03-21 LAB — BASIC METABOLIC PANEL
Anion gap: 6 (ref 5–15)
BUN: 11 mg/dL (ref 8–23)
CO2: 21 mmol/L — ABNORMAL LOW (ref 22–32)
Calcium: 7.8 mg/dL — ABNORMAL LOW (ref 8.9–10.3)
Chloride: 111 mmol/L (ref 98–111)
Creatinine, Ser: 0.67 mg/dL (ref 0.44–1.00)
GFR calc Af Amer: >60 mL/min (ref >60)
GFR calc non Af Amer: >60 mL/min (ref >60)
Glucose, Bld: 279 mg/dL — ABNORMAL HIGH (ref 70–99)
Potassium: 4.7 mmol/L (ref 3.5–5.1)
Sodium: 138 mmol/L (ref 135–145)

## 2018-03-21 LAB — CBC
HCT: 39.2 % (ref 36.0–46.0)
Hemoglobin: 12.1 g/dL (ref 12.0–15.0)
MCH: 30.9 pg (ref 26.0–34.0)
MCHC: 30.9 g/dL (ref 30.0–36.0)
MCV: 100 fL (ref 80.0–100.0)
Platelets: 249 10*3/uL (ref 150–400)
RBC: 3.92 MIL/uL (ref 3.87–5.11)
RDW: 15.9 % — ABNORMAL HIGH (ref 11.5–15.5)
WBC: 9.5 10*3/uL (ref 4.0–10.5)
nRBC: 0 % (ref 0.0–0.2)

## 2018-03-21 LAB — GLUCOSE, CAPILLARY
Glucose-Capillary: 259 mg/dL — ABNORMAL HIGH (ref 70–99)
Glucose-Capillary: 254 mg/dL — ABNORMAL HIGH (ref 70–99)
Glucose-Capillary: 259 mg/dL — ABNORMAL HIGH (ref 70–99)
Glucose-Capillary: 363 mg/dL — ABNORMAL HIGH (ref 70–99)

## 2018-03-21 LAB — HIV ANTIBODY (ROUTINE TESTING W REFLEX): HIV Screen 4th Generation wRfx: NONREACTIVE

## 2018-03-21 MED ORDER — ALPRAZOLAM 0.5 MG PO TABS
0.5000 mg | ORAL_TABLET | Freq: Once | ORAL | Status: AC | PRN
  Administered 2018-03-21: 0.5 mg via ORAL
  Filled 2018-03-21: qty 1

## 2018-03-21 MED ORDER — TRAZODONE HCL 50 MG PO TABS
50.0000 mg | ORAL_TABLET | Freq: Once | ORAL | Status: AC
  Administered 2018-03-21: 50 mg via ORAL
  Filled 2018-03-21: qty 1

## 2018-03-21 NOTE — Progress Notes (Signed)
Patient will call nurse for CPAP -- she does not wish to wear now

## 2018-03-21 NOTE — Progress Notes (Signed)
Inpatient Diabetes Program Recommendations  AACE/ADA: New Consensus Statement on Inpatient Glycemic Control (2015)  Target Ranges:  Prepandial:   less than 140 mg/dL      Peak postprandial:   less than 180 mg/dL (1-2 hours)      Critically ill patients:  140 - 180 mg/dL   Lab Results  Component Value Date   GLUCAP 254 (H) 03/21/2018   HGBA1C 8.0 (H) 05/06/2017    Review of Glycemic Control  Diabetes history: DM2 Outpatient Diabetes medications: Levemir 40 units bid, glipizide 10 mg bid Current orders for Inpatient glycemic control: Novolog 0-15 units tidwc  On Solumedrol 60 mg Q12H HgbA1C - 8% Hypoglycemia on admission. Blood sugars [phone] today.  Inpatient Diabetes Program Recommendations:     Add Levemir 20 units QHS If post-prandial blood sugars > 180 mg/dL, add Novolog 3 units tidwc.  Will continue to follow.   Thank you. Lorenda Peck, RD, LDN, CDE Inpatient Diabetes Coordinator [phone]

## 2018-03-21 NOTE — Progress Notes (Signed)
PROGRESS NOTE    Brittney Martinez  HEN:[id number] DOB: 02/21/1956 DOA: 03/19/2018 PCP: Glenda Chroman, MD    Brief Narrative:  63 year old female with history of rheumatoid arthritis, hypertension, diabetes, presents to the hospital with shortness of breath and cough.  She is found be febrile with a temperature of 102.9.  She was hypotensive with a blood pressure of 86/47.  She was found to have influenza B positive likely precipitating COPD exacerbation with acute respiratory failure.   Assessment & Plan:   Active Problems:   Diarrhea   Influenza B   Rheumatoid arthritis (HCC)   Hypokalemia   Type 2 diabetes mellitus without complication (HCC)   COPD with acute bronchitis (HCC)   Acute respiratory failure with hypoxia (HCC)   HTN (hypertension)   OSA on CPAP   1. Acute respiratory failure with hypoxia.  Weaning down oxygen, but still requiring supplemental oxygen.  She is still hypoxic on room air.  Likely related to COPD exacerbation/influenza.  Wean off oxygen as tolerated. 2. COPD exacerbation with bronchitis.  Continue supportive treatment with bronchodilators.  Continue steroids. 3. Influenza B.  Continue on Tamiflu.  Overall fevers appear to be better. 4. Rheumatoid arthritis.  On chronic immunosuppressive therapy.  She is chronically on Enbrel and methotrexate which are currently on hold. 5. Diabetes.  Blood sugars are stable.  Continue on sliding scale insulin.   6. History of hypertension on Norvasc and metoprolol.  Antihypertensives currently on hold since she was hypotensive on arrival. 7. History of CVA.  She is chronically on aspirin and Plavix. 8. Obstructive sleep apnea.  Continue on CPAP.   DVT prophylaxis: Lovenox Code Status: Full code Family Communication: No family present Disposition Plan: Discharge home once improved   Consultants:     Procedures:     Antimicrobials:      Subjective: No further diarrhea.  Continues to have some shortness of  breath on exertion.  Feels dizzy on standing.  Objective: Vitals:   03/20/18 2213 03/21/18 0845 03/21/18 1402 03/21/18 1433  BP:   (!) 111/51   Pulse: (!) 106  (!) 105   Resp:   16   Temp:   98.3 F (36.8 C)   TempSrc:   Oral   SpO2: 99% (!) 88% 94% 94%  Weight:      Height:        Intake/Output Summary (Last 24 hours) at 03/21/2018 1816 Last data filed at 03/21/2018 1700 Gross per 24 hour  Intake 2589.58 ml  Output --  Net 2589.58 ml   Filed Weights   03/19/18 1637  Weight: 90.3 kg    Examination:  General exam: Alert, awake, oriented x 3 Respiratory system: Bilateral rhonchi. Respiratory effort normal. Cardiovascular system:RRR. No murmurs, rubs, gallops. Gastrointestinal system: Abdomen is nondistended, soft and nontender. No organomegaly or masses felt. Normal bowel sounds heard. Central nervous system: Alert and oriented. No focal neurological deficits. Extremities: No C/C/E, +pedal pulses Skin: No rashes, lesions or ulcers Psychiatry: Judgement and insight appear normal. Mood & affect appropriate.    Data Reviewed: I have personally reviewed following labs and imaging studies  CBC: Recent Labs  Lab 03/19/18 1704 03/20/18 0533 03/21/18 0524  WBC 11.6* 8.8 9.5  NEUTROABS 8.3*  --   --   HGB 13.5 10.8* 12.1  HCT 43.1 34.4* 39.2  MCV 97.3 98.0 100.0  PLT 274 195 361   Basic Metabolic Panel: Recent Labs  Lab 03/19/18 1704 03/20/18 0533 03/21/18 0524  NA 136  140 138  K 3.3* 3.6 4.7  CL 103 114* 111  CO2 22 22 21*  GLUCOSE 97 142* 279*  BUN 7* 8 11  CREATININE 0.80 0.58 0.67  CALCIUM 8.6* 7.4* 7.8*  MG 1.6*  --   --    GFR: Estimated Creatinine Clearance: 73.5 mL/min (by C-G formula based on SCr of 0.67 mg/dL). Liver Function Tests: Recent Labs  Lab 03/19/18 1704  AST 34  ALT 36  ALKPHOS 75  BILITOT 0.7  PROT 7.7  ALBUMIN 3.7   No results for input(s): LIPASE, AMYLASE in the last 168 hours. No results for input(s): AMMONIA in the last  168 hours. Coagulation Profile: Recent Labs  Lab 03/19/18 1704  INR 1.04   Cardiac Enzymes: No results for input(s): CKTOTAL, CKMB, CKMBINDEX, TROPONINI in the last 168 hours. BNP (last 3 results) No results for input(s): PROBNP in the last 8760 hours. HbA1C: No results for input(s): HGBA1C in the last 72 hours. CBG: Recent Labs  Lab 03/20/18 1635 03/20/18 2207 03/21/18 0840 03/21/18 1122 03/21/18 1716  GLUCAP 78 246* 259* 254* 259*   Lipid Profile: No results for input(s): CHOL, HDL, LDLCALC, TRIG, CHOLHDL, LDLDIRECT in the last 72 hours. Thyroid Function Tests: No results for input(s): TSH, T4TOTAL, FREET4, T3FREE, THYROIDAB in the last 72 hours. Anemia Panel: No results for input(s): VITAMINB12, FOLATE, FERRITIN, TIBC, IRON, RETICCTPCT in the last 72 hours. Sepsis Labs: Recent Labs  Lab 03/19/18 1714 03/19/18 1905  LATICACIDVEN 1.93* 0.89    Recent Results (from the past 240 hour(s))  Culture, blood (Routine x 2)     Status: None (Preliminary result)   Collection Time: 03/19/18  5:04 PM  Result Value Ref Range Status   Specimen Description RIGHT ANTECUBITAL  Final   Special Requests   Final    BOTTLES DRAWN AEROBIC AND ANAEROBIC Blood Culture adequate volume   Culture   Final    NO GROWTH 2 DAYS Performed at [facility], [address], [place], [place] [address]    Report Status PENDING  Incomplete  Culture, blood (Routine x 2)     Status: None (Preliminary result)   Collection Time: 03/19/18  5:21 PM  Result Value Ref Range Status   Specimen Description LEFT ANTECUBITAL  Final   Special Requests   Final    BOTTLES DRAWN AEROBIC ONLY Blood Culture adequate volume   Culture   Final    NO GROWTH 2 DAYS Performed at [facility], [address] [place], [place] [address]    Report Status PENDING  Incomplete         Radiology Studies: No results found.      Scheduled Meds: . aspirin EC  81 mg Oral Daily  . atorvastatin  10 mg Oral QPM  .  clopidogrel  75 mg Oral Daily  . dextromethorphan-guaiFENesin  1 tablet Oral BID  . doxepin  75 mg Oral QHS  . enoxaparin (LOVENOX) injection  40 mg Subcutaneous Q24H  . folic acid  1 mg Oral Daily  . insulin aspart  0-15 Units Subcutaneous TID WC  . ipratropium-albuterol  3 mL Nebulization Q6H  . mouth rinse  15 mL Mouth Rinse BID  . methylPREDNISolone (SOLU-MEDROL) injection  60 mg Intravenous Q12H  . mometasone-formoterol  2 puff Inhalation BID  . oseltamivir  75 mg Oral BID  . pantoprazole  40 mg Oral Daily   Continuous Infusions:   LOS: 0 days    Time spent: 22mins    [employer]  Roderic Palau, MD [facility]   If 7PM-7AM, please contact night-coverage [email or web address]  03/21/2018, 6:16 PM

## 2018-03-22 DIAGNOSIS — Z794 Long term (current) use of insulin: Secondary | ICD-10-CM

## 2018-03-22 LAB — GLUCOSE, CAPILLARY
Glucose-Capillary: 300 mg/dL — ABNORMAL HIGH (ref 70–99)
Glucose-Capillary: 367 mg/dL — ABNORMAL HIGH (ref 70–99)
Glucose-Capillary: 276 mg/dL — ABNORMAL HIGH (ref 70–99)

## 2018-03-22 MED ORDER — PREDNISONE 10 MG PO TABS: ORAL_TABLET | ORAL | 0 refills | Status: DC

## 2018-03-22 MED ORDER — INSULIN ASPART 100 UNIT/ML ~~LOC~~ SOLN: 0.0000 [IU] | Freq: Every day | SUBCUTANEOUS | Status: DC

## 2018-03-22 MED ORDER — INSULIN DETEMIR 100 UNIT/ML ~~LOC~~ SOLN
90.0000 [IU] | Freq: Every day | SUBCUTANEOUS | Status: DC
  Administered 2018-03-22: 90 [IU] via SUBCUTANEOUS
  Filled 2018-03-22 (×3): qty 0.9

## 2018-03-22 MED ORDER — OSELTAMIVIR PHOSPHATE 75 MG PO CAPS: 75.0000 mg | ORAL_CAPSULE | Freq: Two times a day (BID) | ORAL | 0 refills | Status: DC

## 2018-03-22 MED ORDER — GLIPIZIDE 5 MG PO TABS
10.0000 mg | ORAL_TABLET | Freq: Two times a day (BID) | ORAL | Status: DC
  Administered 2018-03-22 (×2): 10 mg via ORAL
  Filled 2018-03-22 (×2): qty 2

## 2018-03-22 MED ORDER — INSULIN ASPART 100 UNIT/ML ~~LOC~~ SOLN
0.0000 [IU] | Freq: Three times a day (TID) | SUBCUTANEOUS | Status: DC
  Administered 2018-03-22: 11 [IU] via SUBCUTANEOUS

## 2018-03-22 MED ORDER — NITROGLYCERIN 0.4 % RE OINT: 1.0000 [in_us] | TOPICAL_OINTMENT | Freq: Two times a day (BID) | RECTAL | Status: DC | PRN

## 2018-03-22 MED ORDER — ALUM & MAG HYDROXIDE-SIMETH 200-200-20 MG/5ML PO SUSP
30.0000 mL | Freq: Four times a day (QID) | ORAL | Status: DC | PRN
  Administered 2018-03-22: 30 mL via ORAL
  Filled 2018-03-22: qty 30

## 2018-03-22 NOTE — Progress Notes (Signed)
Removed IV-clean, dry, intact. Reviewed d/c paperwork with patient. Reviewed new medication and medication changes. Answered all questions. Brittney Martinez wheeled stable patient and belongings to main entrance where she was picked up by daughter. D/cd to home.

## 2018-03-22 NOTE — Discharge Summary (Addendum)
Physician Discharge Summary  Brittney Martinez:[id number] DOB: [birth year] DOA: 03/19/2018  PCP: Glenda Chroman, MD  Admit date: 03/19/2018 Discharge date: 03/22/2018  Admitted From: Home Disposition: Home  Recommendations for Outpatient Follow-up:  1. Follow up with PCP in 1-2 weeks 2. Please obtain BMP/CBC in one week  Discharge Condition: Stable CODE STATUS: Full code Diet recommendation: Heart healthy, carb modified  Brief/Interim Summary: 63 year old female with a history of rheumatoid arthritis, hypertension, diabetes, presents to the hospital with shortness of breath and cough.  She was noted to be febrile with a temperature of 102.9.  She was also hypotensive with a blood pressure of 86/47.  She was found to be positive with influenza B and also had COPD exacerbation.  She was admitted for further treatment.  Discharge Diagnoses:  Active Problems:   Diarrhea   Influenza B   Rheumatoid arthritis (HCC)   Hypokalemia   Type 2 diabetes mellitus without complication (HCC)   COPD with acute bronchitis (HCC)   Acute respiratory failure with hypoxia (HCC)   HTN (hypertension)   OSA on CPAP Sepsis secondary to influenza B, present on admission  1. Acute respiratory failure with hypoxia.  Patient was initially requiring supplemental oxygen.  With treatment, this was weaned down to room air.  She is now able to ambulate and maintain saturations above 90%. 2. COPD exacerbation with bronchitis.  Treated with bronchodilators, and intravenous steroids.  She has been transitioned to prednisone taper.  Overall wheezing has resolved.  Will continue bronchodilators at home. 3. Influenza B with sepsis, present on admission.  Treated with Tamiflu, IV fluids and antipyretics.  Fevers have resolved.  Hemodynamics stabilized.  Blood culture showed no growth. 4. Rheumatoid arthritis.  On chronic immunosuppressive therapy.  Resume on discharge. 5. Insulin-dependent diabetes.  Blood sugars are elevated  in the setting of steroids.  Anticipate this should improve as steroids are tapered.  Resume Levemir on discharge. 6. Hypertension.  Resume Norvasc and metoprolol.  Blood pressure stable. 7. History of CVA.  Continue on aspirin and Plavix. 8. Sleep apnea.  Continue on CPAP  Discharge Instructions  Discharge Instructions    Diet - low sodium heart healthy   Complete by:  As directed    Increase activity slowly   Complete by:  As directed      Allergies as of 03/22/2018      Reactions   Aspirin Nausea Only   Propoxyphene N-acetaminophen Nausea And Vomiting   Zithromax [azithromycin] Nausea Only   Erythromycin Rash      Medication List    TAKE these medications   albuterol (2.5 MG/3ML) 0.083% nebulizer solution Commonly known as:  PROVENTIL Take 2.5 mg by nebulization 3 (three) times daily as needed for wheezing or shortness of breath.   albuterol 108 (90 Base) MCG/ACT inhaler Commonly known as:  PROVENTIL HFA;VENTOLIN HFA Inhale 2 puffs into the lungs every 6 (six) hours as needed for wheezing or shortness of breath.   amLODipine 5 MG tablet Commonly known as:  NORVASC Take 5 mg by mouth every morning.   aspirin 81 MG tablet Take 81 mg by mouth daily.   atorvastatin 10 MG tablet Commonly known as:  LIPITOR Take 10 mg by mouth every evening.   budesonide-formoterol 160-4.5 MCG/ACT inhaler Commonly known as:  SYMBICORT Inhale 1 puff into the lungs 2 (two) times daily.   calcium carbonate 600 MG Tabs tablet Commonly known as:  OS-CAL Take 600 mg by mouth 2 (two) times daily with  a meal.   clopidogrel 75 MG tablet Commonly known as:  PLAVIX Take 75 mg by mouth daily.   cycloSPORINE 0.05 % ophthalmic emulsion Commonly known as:  RESTASIS Place 1 drop into both eyes 2 (two) times daily.   doxepin 75 MG capsule Commonly known as:  SINEQUAN Take 75 mg by mouth at bedtime.   ENBREL 50 MG/ML injection Generic drug:  etanercept Inject 50 mg into the skin every  Thursday.   esomeprazole 40 MG capsule Commonly known as:  NEXIUM Take 1 capsule (40 mg total) by mouth 2 (two) times daily before a meal. What changed:  when to take this   famotidine 20 MG tablet Commonly known as:  PEPCID Take 1 tablet (20 mg total) by mouth 2 (two) times daily.   folic acid 1 MG tablet Commonly known as:  FOLVITE Take 1 mg by mouth daily.   glipiZIDE 10 MG tablet Commonly known as:  GLUCOTROL Take 10 mg by mouth 2 (two) times daily before a meal.   HYDROcodone-homatropine 5-1.5 MG/5ML syrup Commonly known as:  HYCODAN Take 5 mLs by mouth every 6 (six) hours as needed for cough.   hydrocortisone 2.5 % rectal cream Commonly known as:  ANUSOL-HC Place 1 application rectally 2 (two) times daily. What changed:    when to take this  reasons to take this   hyoscyamine 0.125 MG SL tablet Commonly known as:  LEVSIN SL Place 1 tablet (0.125 mg total) under the tongue every 6 (six) hours as needed for cramping.   ibuprofen 200 MG tablet Commonly known as:  ADVIL,MOTRIN Take 400 mg by mouth every 6 (six) hours as needed for headache or moderate pain.   insulin detemir 100 UNIT/ML injection Commonly known as:  LEVEMIR Inject 40 Units into the skin 2 (two) times daily.   lubiprostone 8 MCG capsule Commonly known as:  AMITIZA Take 1 capsule (8 mcg total) by mouth daily with breakfast. What changed:  when to take this   methotrexate 2.5 MG tablet Commonly known as:  RHEUMATREX Take 10 mg by mouth See admin instructions. Take 10 mg mg by mouth twice weekly on Thursday and Fridays.  Caution:Chemotherapy. Protect from light.   metoprolol succinate 100 MG 24 hr tablet Commonly known as:  TOPROL-XL Take 100 mg by mouth every morning.   Nitroglycerin 0.4 % Oint Commonly known as:  RECTIV Place 1 inch rectally 2 (two) times daily as needed (for itching).   oseltamivir 75 MG capsule Commonly known as:  TAMIFLU Take 1 capsule (75 mg total) by mouth 2 (two)  times daily.   predniSONE 10 MG tablet Commonly known as:  DELTASONE Take 40mg  po daily for 2 days then 30mg  daily for 2 days then 20mg  daily for 2 days then 10mg  daily for 2 days then stop What changed:    medication strength  additional instructions       Allergies  Allergen Reactions  . Aspirin Nausea Only  . Propoxyphene N-Acetaminophen Nausea And Vomiting  . Zithromax [Azithromycin] Nausea Only  . Erythromycin Rash    Consultations:     Procedures/Studies: Dg Chest 2 View  Result Date: 03/19/2018 CLINICAL DATA:  63 year old female with shortness of breath, productive cough and chest tightness for the past week EXAM: CHEST - 2 VIEW COMPARISON:  Prior chest x-ray 07/12/2016 FINDINGS: Cardiac and mediastinal contours remain unchanged and within normal limits. Atherosclerotic calcifications are present in the transverse aorta. Similar appearance of the lungs with hyperinflation, chronic bronchitic changes  in diffuse mild interstitial prominence. No new focal airspace opacification, pulmonary edema, pleural effusion or pneumothorax. Surgical clips in the right upper quadrant suggest prior cholecystectomy. No acute osseous abnormality. IMPRESSION: 1. Stable chest x-ray without evidence of acute cardiopulmonary process. 2. Chronic pulmonary parenchymal changes consistent with COPD. 3.  Aortic Atherosclerosis (ICD10-170.0) Electronically Signed   By: Jacqulynn Cadet M.D.   On: 03/19/2018 16:55       Subjective: Overall breathing is better.  Still feels weak on ambulation.  Diarrhea has resolved.  Continues to have productive cough  Discharge Exam: Vitals:   03/22/18 0841 03/22/18 0845 03/22/18 1409 03/22/18 1438  BP:   117/66   Pulse:   (!) 105   Resp:   20   Temp:   97.7 F (36.5 C)   TempSrc:   Oral   SpO2: 96% 98% 100% 92%  Weight:      Height:        General: Pt is alert, awake, not in acute distress Cardiovascular: RRR, S1/S2 +, no rubs, no  gallops Respiratory: CTA bilaterally, no wheezing, no rhonchi Abdominal: Soft, NT, ND, bowel sounds + Extremities: no edema, no cyanosis    The results of significant diagnostics from this hospitalization (including imaging, microbiology, ancillary and laboratory) are listed below for reference.     Microbiology: Recent Results (from the past 240 hour(s))  Culture, blood (Routine x 2)     Status: None (Preliminary result)   Collection Time: 03/19/18  5:04 PM  Result Value Ref Range Status   Specimen Description RIGHT ANTECUBITAL  Final   Special Requests   Final    BOTTLES DRAWN AEROBIC AND ANAEROBIC Blood Culture adequate volume   Culture   Final    NO GROWTH 3 DAYS Performed at [facility], [address] [place], [place] [address]    Report Status PENDING  Incomplete  Culture, blood (Routine x 2)     Status: None (Preliminary result)   Collection Time: 03/19/18  5:21 PM  Result Value Ref Range Status   Specimen Description LEFT ANTECUBITAL  Final   Special Requests   Final    BOTTLES DRAWN AEROBIC ONLY Blood Culture adequate volume   Culture   Final    NO GROWTH 3 DAYS Performed at [facility], [address] [place], [place] [address]    Report Status PENDING  Incomplete     Labs: BNP (last 3 results) No results for input(s): BNP in the last 8760 hours. Basic Metabolic Panel: Recent Labs  Lab 03/19/18 1704 03/20/18 0533 03/21/18 0524  NA 136 140 138  K 3.3* 3.6 4.7  CL 103 114* 111  CO2 22 22 21*  GLUCOSE 97 142* 279*  BUN 7* 8 11  CREATININE 0.80 0.58 0.67  CALCIUM 8.6* 7.4* 7.8*  MG 1.6*  --   --    Liver Function Tests: Recent Labs  Lab 03/19/18 1704  AST 34  ALT 36  ALKPHOS 75  BILITOT 0.7  PROT 7.7  ALBUMIN 3.7   No results for input(s): LIPASE, AMYLASE in the last 168 hours. No results for input(s): AMMONIA in the last 168 hours. CBC: Recent Labs  Lab 03/19/18 1704 03/20/18 0533 03/21/18 0524  WBC 11.6* 8.8 9.5  NEUTROABS 8.3*  --    --   HGB 13.5 10.8* 12.1  HCT 43.1 34.4* 39.2  MCV 97.3 98.0 100.0  PLT 274 195 249   Cardiac Enzymes: No results for input(s): CKTOTAL, CKMB, CKMBINDEX, TROPONINI in  the last 168 hours. BNP: Invalid input(s): POCBNP CBG: Recent Labs  Lab 03/21/18 1122 03/21/18 1716 03/21/18 2219 03/22/18 0738 03/22/18 1109  GLUCAP 254* 259* 363* 300* 367*   D-Dimer No results for input(s): DDIMER in the last 72 hours. Hgb A1c No results for input(s): HGBA1C in the last 72 hours. Lipid Profile No results for input(s): CHOL, HDL, LDLCALC, TRIG, CHOLHDL, LDLDIRECT in the last 72 hours. Thyroid function studies No results for input(s): TSH, T4TOTAL, T3FREE, THYROIDAB in the last 72 hours.  Invalid input(s): FREET3 Anemia work up No results for input(s): VITAMINB12, FOLATE, FERRITIN, TIBC, IRON, RETICCTPCT in the last 72 hours. Urinalysis    Component Value Date/Time   COLORURINE STRAW (A) 03/19/2018 1722   APPEARANCEUR CLEAR 03/19/2018 1722   LABSPEC 1.003 (L) 03/19/2018 1722   PHURINE 7.0 03/19/2018 1722   GLUCOSEU NEGATIVE 03/19/2018 1722   HGBUR NEGATIVE 03/19/2018 1722   BILIRUBINUR NEGATIVE 03/19/2018 1722   KETONESUR NEGATIVE 03/19/2018 1722   PROTEINUR NEGATIVE 03/19/2018 1722   UROBILINOGEN 0.2 10/01/2010 1245   NITRITE NEGATIVE 03/19/2018 1722   LEUKOCYTESUR NEGATIVE 03/19/2018 1722   Sepsis Labs Invalid input(s): PROCALCITONIN,  WBC,  LACTICIDVEN Microbiology Recent Results (from the past 240 hour(s))  Culture, blood (Routine x 2)     Status: None (Preliminary result)   Collection Time: 03/19/18  5:04 PM  Result Value Ref Range Status   Specimen Description RIGHT ANTECUBITAL  Final   Special Requests   Final    BOTTLES DRAWN AEROBIC AND ANAEROBIC Blood Culture adequate volume   Culture   Final    NO GROWTH 3 DAYS Performed at [facility], [address] [place], [place] [address]    Report Status PENDING  Incomplete  Culture, blood (Routine x 2)     Status:  None (Preliminary result)   Collection Time: 03/19/18  5:21 PM  Result Value Ref Range Status   Specimen Description LEFT ANTECUBITAL  Final   Special Requests   Final    BOTTLES DRAWN AEROBIC ONLY Blood Culture adequate volume   Culture   Final    NO GROWTH 3 DAYS Performed at [facility], [address], [place], [place] [address]    Report Status PENDING  Incomplete     Time coordinating discharge: 6mins  SIGNED:   Kathie Dike, MD  [facility] 03/22/2018, 3:53 PM   If 7PM-7AM, please contact night-coverage [email or web address]

## 2018-03-22 NOTE — Progress Notes (Signed)
SATURATION QUALIFICATIONS: (This note is used to comply with regulatory documentation for home oxygen)  Patient Saturations on Room Air at Rest = 94%  Patient Saturations on Room Air while Ambulating = 90%  Patient Saturations on 0 Liters of oxygen while Ambulating = %  Please briefly explain why patient needs home oxygen:

## 2018-03-22 NOTE — Progress Notes (Signed)
Patient declined CPAP earlier.

## 2018-03-24 LAB — CULTURE, BLOOD (ROUTINE X 2)
Culture: NO GROWTH
Culture: NO GROWTH
Special Requests: ADEQUATE
Special Requests: ADEQUATE

## 2018-05-05 ENCOUNTER — Ambulatory Visit (INDEPENDENT_AMBULATORY_CARE_PROVIDER_SITE_OTHER): Payer: Medicare Other | Admitting: Nurse Practitioner

## 2018-05-05 ENCOUNTER — Encounter: Payer: Self-pay | Admitting: Nurse Practitioner

## 2018-05-05 VITALS — BP 119/78 | HR 82 | Temp 97.4°F | Ht 63.0 in | Wt 197.4 lb

## 2018-05-05 DIAGNOSIS — K219 Gastro-esophageal reflux disease without esophagitis: Secondary | ICD-10-CM

## 2018-05-05 DIAGNOSIS — K5909 Other constipation: Secondary | ICD-10-CM

## 2018-05-05 NOTE — Progress Notes (Signed)
CC'D TO PCP °

## 2018-05-05 NOTE — Assessment & Plan Note (Signed)
Overall her "classic" GERD symptoms are relatively well managed on ongoing PPI.  Insurance is tried to remove manage her use of PPIs without ever clinically evaluating the patient.  If she comes off her medications she has significant flares of her heartburn.  At this point I recommended that she continue her PPI because of her symptomatic episodes off PPI.  She does have what sounds to be like intermittent/rare esophageal spasms, up to every week to every other week with one occurrence, typically with eating.  I am unsure if she is having true dysphasia.  At this point I will schedule barium pill esophagram to further evaluate.  She will be due for colonoscopy at her follow-up that we can schedule.  If she still having symptoms and no reasonable explanation per BPE then we can consider adding an upper endoscopy at that time as well.  Follow-up in 3 months.

## 2018-05-05 NOTE — Assessment & Plan Note (Signed)
Constipation generally well managed on Amitiza twice a day.  If she starts to have looser stools she will back off to once a day or every other day until her symptoms returned to baseline.  Recommend she continue her current medications and follow-up in 3 months.

## 2018-05-05 NOTE — Patient Instructions (Signed)
Your health issues we discussed today were:   GERD (heartburn): 1. Continue taking Nexium 2. Call us if you have any worsening symptoms 3. You can continue to use Tums intermittently, as needed for heartburn symptoms 4. We will scheduled a swallowing test to evaluate the funny "squeezing" feelings you have sometimes when eating 5. Further recommendations will be made based on the results of the swallowing study  Constipation: 1. Continue to take Amitiza as you have been 2. Call us if you have any severe worsening  Overall I recommend:  1. Follow-up in 3 months 2. Call us if you have any questions or concerns.  At [facility] we value your feedback. You may receive a survey about your visit today. Please share your experience as we strive to create trusting relationships with our patients to provide genuine, compassionate, quality care.  We appreciate your understanding and patience as we review any laboratory studies, imaging, and other diagnostic tests that are ordered as we care for you. Our office policy is 5 business days for review of these results, and any emergent or urgent results are addressed in a timely manner for your best interest. If you do not hear from our office in 1 week, please contact us.   We also encourage the use of MyChart, which contains your medical information for your review as well. If you are not enrolled in this feature, an access code is on this after visit summary for your convenience. Thank you for allowing [place] to be involved in your care.  It was great to see you today!  I hope you have a great day!!

## 2018-05-05 NOTE — Progress Notes (Signed)
Referring Provider: Glenda Chroman, MD Primary Care Physician:  Glenda Chroman, MD Primary GI:  Dr. Gala Romney  Chief Complaint  Patient presents with  . Gastroesophageal Reflux  . Irritable Bowel Syndrome    HPI:   Brittney Martinez is a 63 y.o. female who presents for follow-up on GERD and IBS.  The patient was last seen in our office 11/19/2017 for GERD, IBS constipation type.  Previously tried and failed Amitiza 8 mcg, Linzess 145 mcg, Linzess 290 mcg, Trulance (adverse effect).  Stool softeners over-the-counter made her stools loose.  At her last visit still with lower abdominal pain which sometimes improves with a bowel movement.  Mixed constipation and diarrhea and typically has a single hard stool and and several episodes of diarrhea.  At that time she was using stool softeners and once a day does not work but twice a day causes diarrhea.  She was also taking Amitiza every other day which does not work well but twice a day causes diarrhea.  Has not tried it once a day.  GERD well managed on Nexium, occasional Tums for breakthrough.  No other GI complaints.  Colonoscopy up-to-date 2015 but since then her father was diagnosed with colon cancer and recall was adjusted to 5 years (due in 2020).  Recommended Amitiza 8 mcg once a day with a progress report in 2 weeks.  Follow-up in 3 months.  She called our office indicating the pharmacy would not fill her medication because it is not covered by her insurance.  Today she states she's doing ok. Was recently in the hospital for influenza B with sepsis and COPD exacerbation. She's much better recovered now. Constipation is a bit better. Having a bowel movement about every 2-3 days, stools firm but pass easily (no hard stools.) Currently taking Amitiza 8 mcg twice with good results. Will skip a dose when/if she (rarely) gets diarrhea. Still on Nexium and has added Pepcid. She has had a bone density scan recently which was normal. GERD generally well managed  on Nexium. She symptoms that she describes and sound like esophageal spasms about every 1-2 weeks with eating. When this happens she'll take TUMS and it helps. Intermittent/rare abdominal pain in the lower abdomen like she has to use the bathroom, but she doesn't.   Past Medical History:  Diagnosis Date  . Abdominal aortic stenosis   . Anal fissure   . Anxiety   . Asthma   . Complication of anesthesia   . COPD (chronic obstructive pulmonary disease) ([place])   . Depression    grief  . Diabetes mellitus   . Diverticulitis   . GERD (gastroesophageal reflux disease)   . Glaucoma   . Gout   . Hypertension   . IBS (irritable bowel syndrome)   . PONV (postoperative nausea and vomiting)   . RA (rheumatoid arthritis) ([place])   . Sleep apnea    cpap; patient has old CPAP and not using and is waiting on new one.  . Stroke ([place])    "mini-stroke"- no deficits  . TIA (transient ischemic attack)    2010. No deficits    Past Surgical History:  Procedure Laterality Date  . ABDOMINAL HYSTERECTOMY  1982   complete  . BREAST LUMPECTOMY     right-benign  . CATARACT EXTRACTION W/PHACO Right 05/13/2017   Procedure: CATARACT EXTRACTION PHACO AND INTRAOCULAR LENS PLACEMENT (IOC);  Surgeon: Tonny Branch, MD;  Location: [facility];  Service: Ophthalmology;  Laterality: Right;  CDE:  8.59  . CATARACT EXTRACTION W/PHACO Left 05/27/2017   Procedure: CATARACT EXTRACTION WITH  PHACOEMULSIFICATION AND INTRAOCULAR LENS PLACEMENT LEFT EYE;  Surgeon: Tonny Branch, MD;  Location: [facility];  Service: Ophthalmology;  Laterality: Left;  CDE: 5.66  . CHOLECYSTECTOMY  2003  . COLONOSCOPY  12/14/09   Dr. Gala Romney :anal papilla and hemorrhoids,diminutive hperplastic rectal polyps/normal colon  . COLONOSCOPY N/A 10/29/2013   [facility]:HYIF papilla and internal hemorrhoids; colonic polyps-removed as described above. I suspect benign anorectal bleeding in the setting of hemorrhoids and  possibly fissure. tubular adenoma. next TCS 10/2020.  .  ELBOW SURGERY     right  . ESOPHAGOGASTRODUODENOSCOPY  12/14/09   Dr. Gala Romney :schatzkis ring 38F, otherwise normal  . ESOPHAGOGASTRODUODENOSCOPY N/A 10/29/2013   RMR: Distal esophageal pseudodiverticulum/Nissen fundoplication  . FINGER SURGERY     on right middle, pointer, and index fingers  . FISSURECTOMY     several  . INCISIONAL HERNIA REPAIR N/A 03/22/2014   Procedure: [name] HERNIORRHAPHY;  Surgeon: Jamesetta So, MD;  Location: [facility];  Service: General;  Laterality: N/A;  . INSERTION OF MESH N/A 03/22/2014   Procedure: INSERTION OF MESH;  Surgeon: Jamesetta So, MD;  Location: [facility];  Service: General;  Laterality: N/A;  . NISSEN FUNDOPLICATION  [address]   [facility] [place]  . WRIST SURGERY     left    Current Outpatient Medications  Medication Sig Dispense Refill  . albuterol (PROVENTIL HFA;VENTOLIN HFA) 108 (90 BASE) MCG/ACT inhaler Inhale 2 puffs into the lungs every 6 (six) hours as needed for wheezing or shortness of breath.     Marland Kitchen albuterol (PROVENTIL) (2.5 MG/3ML) 0.083% nebulizer solution Take 2.5 mg by nebulization 3 (three) times daily as needed for wheezing or shortness of breath.    Marland Kitchen amLODipine (NORVASC) 5 MG tablet Take 5 mg by mouth every morning.     Marland Kitchen aspirin 81 MG tablet Take 81 mg by mouth daily.    Marland Kitchen atorvastatin (LIPITOR) 10 MG tablet Take 10 mg by mouth every evening.     . budesonide-formoterol (SYMBICORT) 160-4.5 MCG/ACT inhaler Inhale 1 puff into the lungs 2 (two) times daily.     . calcium carbonate (OS-CAL) 600 MG TABS Take 600 mg by mouth 2 (two) times daily with a meal.    . clopidogrel (PLAVIX) 75 MG tablet Take 75 mg by mouth daily.      . cycloSPORINE (RESTASIS) 0.05 % ophthalmic emulsion Place 1 drop into both eyes 2 (two) times daily.      Marland Kitchen doxepin (SINEQUAN) 75 MG capsule Take 75 mg by mouth at bedtime.      Marland Kitchen esomeprazole (NEXIUM) 40 MG capsule Take 1 capsule (40 mg total) by mouth 2 (two) times daily before a meal. (Patient taking differently: Take  40 mg by mouth at bedtime. ) 60 capsule 5  . etanercept (ENBREL) 50 MG/ML injection Inject 50 mg into the skin every Thursday.    . famotidine (PEPCID) 20 MG tablet Take 1 tablet (20 mg total) by mouth 2 (two) times daily. 18 tablet 0  . folic acid (FOLVITE) 1 MG tablet Take 1 mg by mouth daily.     Marland Kitchen glipiZIDE (GLUCOTROL) 10 MG tablet Take 10 mg by mouth 2 (two) times daily before a meal.    . HYDROcodone-homatropine (HYCODAN) 5-1.5 MG/5ML syrup Take 5 mLs by mouth every 6 (six) hours as needed for cough.   0  . hydrocortisone (ANUSOL-HC) 2.5 % rectal cream Place 1 application rectally  2 (two) times daily. (Patient taking differently: Place 1 application rectally 2 (two) times daily as needed for hemorrhoids or anal itching. ) 30 g 0  . hyoscyamine (LEVSIN SL) 0.125 MG SL tablet Place 1 tablet (0.125 mg total) under the tongue every 6 (six) hours as needed for cramping. 120 tablet 0  . ibuprofen (ADVIL,MOTRIN) 200 MG tablet Take 400 mg by mouth every 6 (six) hours as needed for headache or moderate pain.    Marland Kitchen insulin detemir (LEVEMIR) 100 UNIT/ML injection Inject 40 Units into the skin 2 (two) times daily.     Marland Kitchen lubiprostone (AMITIZA) 8 MCG capsule Take 1 capsule (8 mcg total) by mouth daily with breakfast. (Patient taking differently: Take 8 mcg by mouth every other day. ) 30 capsule 3  . methotrexate (RHEUMATREX) 2.5 MG tablet Take 10 mg by mouth See admin instructions. Take 10 mg mg by mouth twice weekly on Thursday and Fridays.  Caution:Chemotherapy. Protect from light.    . metoprolol (TOPROL-XL) 100 MG 24 hr tablet Take 100 mg by mouth every morning.     . Nitroglycerin (RECTIV) 0.4 % OINT Place 1 inch rectally 2 (two) times daily as needed (for itching).     No current facility-administered medications for this visit.     Allergies as of 05/05/2018 - Review Complete 05/05/2018  Allergen Reaction Noted  . Aspirin Nausea Only   . Propoxyphene n-acetaminophen Nausea And Vomiting   .  Zithromax [azithromycin] Nausea Only 10/01/2010  . Erythromycin Rash 02/05/2014    Family History  Problem Relation Age of Onset  . Colon cancer Father 53  . Hypertension Sister   . Hypertension Brother   . Kidney disease Brother     Social History   Socioeconomic History  . Marital status: Widowed    Spouse name: Not on file  . Number of children: Not on file  . Years of education: Not on file  . Highest education level: Not on file  Occupational History  . Occupation: disabled  Social Needs  . Financial resource strain: Not on file  . Food insecurity:    Worry: Not on file    Inability: Not on file  . Transportation needs:    Medical: Not on file    Non-medical: Not on file  Tobacco Use  . Smoking status: Former Smoker    Packs/day: 0.25    Years: 19.00    Pack years: 4.75    Types: Cigarettes    Last attempt to quit: 01/09/2007    Years since quitting: 11.3  . Smokeless tobacco: Never Used  Substance and Sexual Activity  . Alcohol use: No  . Drug use: No  . Sexual activity: Never  Lifestyle  . Physical activity:    Days per week: Not on file    Minutes per session: Not on file  . Stress: Not on file  Relationships  . Social connections:    Talks on phone: Not on file    Gets together: Not on file    Attends religious service: Not on file    Active member of club or organization: Not on file    Attends meetings of clubs or organizations: Not on file    Relationship status: Not on file  Other Topics Concern  . Not on file  Social History Narrative   1 son-healthy   1 daughter-MVA (drunk-driver)    Review of Systems: General: Negative for anorexia, weight loss, fever, chills, fatigue, weakness. Eyes: Negative for vision  changes.  ENT: Negative for hoarseness, difficulty swallowing. Query esophageal spasms as per HPI. CV: Negative for chest pain, angina, palpitations, peripheral edema.  Respiratory: Negative for dyspnea at rest, cough, sputum,  wheezing.  GI: See history of present illness. Endo: Negative for unusual weight change.  Heme: Negative for bruising or bleeding. Allergy: Negative for rash or hives.   Physical Exam: BP 119/78   Pulse 82   Temp (!) 97.4 F (36.3 C) (Oral)   Ht 5\' 3"  (1.6 m)   Wt 197 lb 6.4 oz (89.5 kg)   BMI 34.97 kg/m  General:   Alert and oriented. Pleasant and cooperative. Well-nourished and well-developed.  Eyes:  Without icterus, sclera clear and conjunctiva pink.  Ears:  Normal auditory acuity. Cardiovascular:  S1, S2 present without murmurs appreciated. Extremities without clubbing or edema. Respiratory:  Clear to auscultation bilaterally. No wheezes, rales, or rhonchi. No distress.  Gastrointestinal:  +BS, soft, and non-distended. Mild lower abdominal TTP. No HSM noted. No guarding or rebound. No masses appreciated.  Rectal:  Deferred  Musculoskalatal:  Symmetrical without gross deformities. Skin:  Intact without significant lesions or rashes. Neurologic:  Alert and oriented x4;  grossly normal neurologically. Psych:  Alert and cooperative. Normal mood and affect. Heme/Lymph/Immune: No excessive bruising noted.    05/05/2018 10:14 AM   Disclaimer: This note was dictated with voice recognition software. Similar sounding words can inadvertently be transcribed and may not be corrected upon review.

## 2018-05-09 ENCOUNTER — Ambulatory Visit (HOSPITAL_COMMUNITY): Payer: Medicare Other

## 2018-05-13 ENCOUNTER — Ambulatory Visit (HOSPITAL_COMMUNITY)
Admission: RE | Admit: 2018-05-13 | Discharge: 2018-05-13 | Disposition: A | Discharge status: Home / Self Care | Payer: Medicare Other | Source: Ambulatory Visit | Attending: Nurse Practitioner | Admitting: Nurse Practitioner

## 2018-05-13 DIAGNOSIS — K5909 Other constipation: Secondary | ICD-10-CM | POA: Diagnosis present

## 2018-05-13 DIAGNOSIS — K219 Gastro-esophageal reflux disease without esophagitis: Secondary | ICD-10-CM | POA: Diagnosis present

## 2018-07-14 ENCOUNTER — Other Ambulatory Visit: Payer: Self-pay | Admitting: Nurse Practitioner

## 2018-07-14 DIAGNOSIS — K5909 Other constipation: Secondary | ICD-10-CM

## 2018-07-14 DIAGNOSIS — K581 Irritable bowel syndrome with constipation: Secondary | ICD-10-CM

## 2018-07-14 DIAGNOSIS — K219 Gastro-esophageal reflux disease without esophagitis: Secondary | ICD-10-CM

## 2018-08-19 ENCOUNTER — Other Ambulatory Visit: Payer: Self-pay

## 2018-08-19 ENCOUNTER — Encounter: Payer: Self-pay | Admitting: Nurse Practitioner

## 2018-08-19 ENCOUNTER — Ambulatory Visit (INDEPENDENT_AMBULATORY_CARE_PROVIDER_SITE_OTHER): Payer: Medicare Other | Admitting: Nurse Practitioner

## 2018-08-19 VITALS — BP 117/74 | HR 90 | Temp 98.2°F | Ht 61.5 in | Wt 201.4 lb

## 2018-08-19 DIAGNOSIS — R1310 Dysphagia, unspecified: Secondary | ICD-10-CM

## 2018-08-19 DIAGNOSIS — Z800 Family history of malignant neoplasm of digestive organs: Secondary | ICD-10-CM

## 2018-08-19 DIAGNOSIS — K581 Irritable bowel syndrome with constipation: Secondary | ICD-10-CM | POA: Diagnosis not present

## 2018-08-19 DIAGNOSIS — R1319 Other dysphagia: Secondary | ICD-10-CM

## 2018-08-19 DIAGNOSIS — K219 Gastro-esophageal reflux disease without esophagitis: Secondary | ICD-10-CM

## 2018-08-19 NOTE — Progress Notes (Signed)
Referring Provider: Glenda Chroman, MD Primary Care Physician:  Glenda Chroman, MD Primary GI:  Dr. Gala Romney  Chief Complaint  Patient presents with  . Gastroesophageal Reflux    feels like lump is in throat when she eats and coughs a lot  . Irritable Bowel Syndrome    DOING OKAY    HPI:   Brittney Martinez is a 63 y.o. female who presents for follow-up on GERD and IBS.  The patient was last seen in our office 05/05/2018 for GERD and constipation.  Chronic history of IBS constipation type and GERD.  Previously tried and failed Amitiza 8 mcg, Linzess 145 mcg, Linzess 290 mcg, Trulance (adverse effect): Stool softeners OTC (cause loose stools).  Previously noted with over-the-counter stool softeners that she takes it once a day does not work and twice a day causes diarrhea.  Colonoscopy up-to-date 2015 however since then her father was diagnosed with colon cancer which would change her recall date to 2020.  At her last visit constipation was a bit better with a bowel movement every 2 to 3 days with stools that are firm but pass easily on Amitiza 8 mcg twice daily.  Will skip a dose rarely if she gets diarrhea.  GERD well managed on Nexium, recently completed bone density scan which was normal.  She describes symptoms reminiscent of esophageal spasm every couple weeks and when this happens she takes Tums and it helps.  Intermittent lower abdominal pain with urge to defecate, but no bowel movement.  Recommended continue medications, BPE, continue Amitiza, follow-up in 3 months.  BPE completed 05/13/2018 which found mild esophageal dysmotility and spontaneous GERD, area of smooth narrowing/under distention in the GE junction that may represent sequela of prior reflux esophagitis.  Today she states she's doing well overall. IBS-C doing well on Amitiza 8 mcg bid. Still struggling with a globus sensation, occasional solid food dysphagia with associated coughing. Has intermittent abdominal pain which has no  known triggers other than "worry." Typically self resolves. Occasionally bad. Denies N/V, hematochezia, melena, fever, chills, unintentional weight loss. GERD doing well on Nexium. Has allergies and asthma; states she gets a tickle, then a lump which triggers coughing. Denies URI or flu-like symptoms. Denies loss of sense of taste or smell. Denies chest pain, dyspnea, dizziness, lightheadedness, syncope, near syncope. Denies any other upper or lower GI symptoms.  Past Medical History:  Diagnosis Date  . Abdominal aortic stenosis   . Anal fissure   . Anxiety   . Asthma   . Complication of anesthesia   . COPD (chronic obstructive pulmonary disease) ([place])   . Depression    grief  . Diabetes mellitus   . Diverticulitis   . GERD (gastroesophageal reflux disease)   . Glaucoma   . Gout   . Hypertension   . IBS (irritable bowel syndrome)   . PONV (postoperative nausea and vomiting)   . RA (rheumatoid arthritis) ([place])   . Sleep apnea    cpap; patient has old CPAP and not using and is waiting on new one.  . Stroke ([place])    "mini-stroke"- no deficits  . TIA (transient ischemic attack)    2010. No deficits    Past Surgical History:  Procedure Laterality Date  . ABDOMINAL HYSTERECTOMY  1982   complete  . BREAST LUMPECTOMY     right-benign  . CATARACT EXTRACTION W/PHACO Right 05/13/2017   Procedure: CATARACT EXTRACTION PHACO AND INTRAOCULAR LENS PLACEMENT (IOC);  Surgeon: Tonny Branch, MD;  Location: [facility];  Service: Ophthalmology;  Laterality: Right;  CDE: 8.59  . CATARACT EXTRACTION W/PHACO Left 05/27/2017   Procedure: CATARACT EXTRACTION WITH  PHACOEMULSIFICATION AND INTRAOCULAR LENS PLACEMENT LEFT EYE;  Surgeon: Tonny Branch, MD;  Location: [facility];  Service: Ophthalmology;  Laterality: Left;  CDE: 5.66  . CHOLECYSTECTOMY  2003  . COLONOSCOPY  12/14/09   Dr. Gala Romney :anal papilla and hemorrhoids,diminutive hperplastic rectal polyps/normal colon  . COLONOSCOPY N/A 10/29/2013   HEN:IDPO  papilla and internal hemorrhoids; colonic polyps-removed as described above. I suspect benign anorectal bleeding in the setting of hemorrhoids and  possibly fissure. tubular adenoma. next TCS 10/2020.  . ELBOW SURGERY     right  . ESOPHAGOGASTRODUODENOSCOPY  12/14/09   Dr. Gala Romney :schatzkis ring 45F, otherwise normal  . ESOPHAGOGASTRODUODENOSCOPY N/A 10/29/2013   RMR: Distal esophageal pseudodiverticulum/Nissen fundoplication  . FINGER SURGERY     on right middle, pointer, and index fingers  . FISSURECTOMY     several  . INCISIONAL HERNIA REPAIR N/A 03/22/2014   Procedure: [name] HERNIORRHAPHY;  Surgeon: Jamesetta So, MD;  Location: [facility];  Service: General;  Laterality: N/A;  . INSERTION OF MESH N/A 03/22/2014   Procedure: INSERTION OF MESH;  Surgeon: Jamesetta So, MD;  Location: [facility];  Service: General;  Laterality: N/A;  . NISSEN FUNDOPLICATION  24[address]   [facility] [place]  . WRIST SURGERY     left    Current Outpatient Medications  Medication Sig Dispense Refill  . albuterol (PROVENTIL HFA;VENTOLIN HFA) 108 (90 BASE) MCG/ACT inhaler Inhale 2 puffs into the lungs every 6 (six) hours as needed for wheezing or shortness of breath.     Marland Kitchen albuterol (PROVENTIL) (2.5 MG/3ML) 0.083% nebulizer solution Take 2.5 mg by nebulization 3 (three) times daily as needed for wheezing or shortness of breath.    . AMITIZA 8 MCG capsule TAKE 1 CAPSULE BY MOUTH ONCE DAILY WITH  BREAKFAST 30 capsule 5  . amLODipine (NORVASC) 5 MG tablet Take 5 mg by mouth every morning.     Marland Kitchen aspirin 81 MG tablet Take 81 mg by mouth daily.    Marland Kitchen atorvastatin (LIPITOR) 10 MG tablet Take 10 mg by mouth every evening.     . budesonide-formoterol (SYMBICORT) 160-4.5 MCG/ACT inhaler Inhale 1 puff into the lungs 2 (two) times daily.     . calcium carbonate (OS-CAL) 600 MG TABS Take 600 mg by mouth 2 (two) times daily with a meal.    . clopidogrel (PLAVIX) 75 MG tablet Take 75 mg by mouth daily.      . cycloSPORINE (RESTASIS) 0.05  % ophthalmic emulsion Place 1 drop into both eyes 2 (two) times daily.      Marland Kitchen doxepin (SINEQUAN) 75 MG capsule Take 75 mg by mouth at bedtime.      Marland Kitchen esomeprazole (NEXIUM) 40 MG capsule Take 1 capsule (40 mg total) by mouth 2 (two) times daily before a meal. (Patient taking differently: Take 40 mg by mouth at bedtime. ) 60 capsule 5  . etanercept (ENBREL) 50 MG/ML injection Inject 50 mg into the skin every Thursday.    . famotidine (PEPCID) 20 MG tablet Take 1 tablet (20 mg total) by mouth 2 (two) times daily. 18 tablet 0  . folic acid (FOLVITE) 1 MG tablet Take 1 mg by mouth daily.     Marland Kitchen glipiZIDE (GLUCOTROL) 10 MG tablet Take 10 mg by mouth 2 (two) times daily before a meal.    . hydrocortisone (ANUSOL-HC) 2.5 %  rectal cream Place 1 application rectally 2 (two) times daily. (Patient taking differently: Place 1 application rectally 2 (two) times daily as needed for hemorrhoids or anal itching. ) 30 g 0  . hyoscyamine (LEVSIN SL) 0.125 MG SL tablet Place 1 tablet (0.125 mg total) under the tongue every 6 (six) hours as needed for cramping. 120 tablet 0  . ibuprofen (ADVIL,MOTRIN) 200 MG tablet Take 400 mg by mouth every 6 (six) hours as needed for headache or moderate pain.    Marland Kitchen insulin detemir (LEVEMIR) 100 UNIT/ML injection Inject 40 Units into the skin 2 (two) times daily.     . methotrexate (RHEUMATREX) 2.5 MG tablet Take 10 mg by mouth See admin instructions. Take 10 mg mg by mouth twice weekly on Thursday and Fridays.  Caution:Chemotherapy. Protect from light.    . metoprolol (TOPROL-XL) 100 MG 24 hr tablet Take 100 mg by mouth every morning.     . Nitroglycerin (RECTIV) 0.4 % OINT Place 1 inch rectally 2 (two) times daily as needed (for itching).    Marland Kitchen HYDROcodone-homatropine (HYCODAN) 5-1.5 MG/5ML syrup Take 5 mLs by mouth every 6 (six) hours as needed for cough.   0   No current facility-administered medications for this visit.     Allergies as of 08/19/2018 - Review Complete 08/19/2018    Allergen Reaction Noted  . Aspirin Nausea Only   . Propoxyphene n-acetaminophen Nausea And Vomiting   . Zithromax [azithromycin] Nausea Only 10/01/2010  . Erythromycin Rash 02/05/2014    Family History  Problem Relation Age of Onset  . Colon cancer Father 4  . Hypertension Sister   . Hypertension Brother   . Kidney disease Brother     Social History   Socioeconomic History  . Marital status: Widowed    Spouse name: Not on file  . Number of children: Not on file  . Years of education: Not on file  . Highest education level: Not on file  Occupational History  . Occupation: disabled  Social Needs  . Financial resource strain: Not on file  . Food insecurity    Worry: Not on file    Inability: Not on file  . Transportation needs    Medical: Not on file    Non-medical: Not on file  Tobacco Use  . Smoking status: Former Smoker    Packs/day: 0.25    Years: 19.00    Pack years: 4.75    Types: Cigarettes    Quit date: 01/09/2007    Years since quitting: 11.6  . Smokeless tobacco: Never Used  Substance and Sexual Activity  . Alcohol use: No  . Drug use: No  . Sexual activity: Never  Lifestyle  . Physical activity    Days per week: Not on file    Minutes per session: Not on file  . Stress: Not on file  Relationships  . Social Herbalist on phone: Not on file    Gets together: Not on file    Attends religious service: Not on file    Active member of club or organization: Not on file    Attends meetings of clubs or organizations: Not on file    Relationship status: Not on file  Other Topics Concern  . Not on file  Social History Narrative   1 son-healthy   1 daughter-MVA (drunk-driver)    Review of Systems: General: Negative for anorexia, weight loss, fever, chills, fatigue, weakness. Eyes: Negative for vision changes.  ENT: Negative for  hoarseness, difficulty swallowing , nasal congestion. CV: Negative for chest pain, angina, palpitations, dyspnea  on exertion, peripheral edema.  Respiratory: Negative for dyspnea at rest, dyspnea on exertion, cough, sputum, wheezing.  GI: See history of present illness. GU:  Negative for dysuria, hematuria, urinary incontinence, urinary frequency, nocturnal urination.  MS: Negative for joint pain, low back pain.  Derm: Negative for rash or itching.  Neuro: Negative for weakness, abnormal sensation, seizure, frequent headaches, memory loss, confusion.  Psych: Negative for anxiety, depression, suicidal ideation, hallucinations.  Endo: Negative for unusual weight change.  Heme: Negative for bruising or bleeding. Allergy: Negative for rash or hives.   Physical Exam: BP 117/74   Pulse 90   Temp 98.2 F (36.8 C) (Oral)   Ht 5' 1.5" (1.562 m)   Wt 201 lb 6.4 oz (91.4 kg)   BMI 37.44 kg/m  General:   Alert and oriented. Pleasant and cooperative. Well-nourished and well-developed.  Head:  Normocephalic and atraumatic. Eyes:  Without icterus, sclera clear and conjunctiva pink.  Ears:  Normal auditory acuity. Mouth:  No deformity or lesions, oral mucosa pink.  Throat/Neck:  Supple, without mass or thyromegaly. Cardiovascular:  S1, S2 present without murmurs appreciated. Normal pulses noted. Extremities without clubbing or edema. Respiratory:  Clear to auscultation bilaterally. No wheezes, rales, or rhonchi. No distress.  Gastrointestinal:  +BS, soft, non-tender and non-distended. No HSM noted. No guarding or rebound. No masses appreciated.  Rectal:  Deferred  Musculoskalatal:  Symmetrical without gross deformities. Normal posture. Skin:  Intact without significant lesions or rashes. Neurologic:  Alert and oriented x4;  grossly normal neurologically. Psych:  Alert and cooperative. Normal mood and affect. Heme/Lymph/Immune: No significant cervical adenopathy. No excessive bruising noted.    08/19/2018 10:24 AM   Disclaimer: This note was dictated with voice recognition software. Similar sounding  words can inadvertently be transcribed and may not be corrected upon review.

## 2018-08-19 NOTE — Assessment & Plan Note (Signed)
The patient previously was recommended to have a repeat colonoscopy in 10 years.  However, since then her father was diagnosed with colon cancer which would essentially change her interval to 5 years.  She is currently due at this time.  We will proceed with colonoscopy for further evaluation.  Proceed with TCS on propofol/MAC with Dr. Gala Romney in near future: the risks, benefits, and alternatives have been discussed with the patient in detail. The patient states understanding and desires to proceed.  The patient is currently on Plavix, hydrocodone.  No other anticoagulants, anxiolytics, chronic pain medications, or antidepressants.  We will plan for the procedure on propofol/MAC to promote adequate sedation.  No need to hold Plavix for simple colonoscopy, however it will be held due to the need for EGD with esophageal dilation same time.

## 2018-08-19 NOTE — Assessment & Plan Note (Addendum)
Patient has a myriad of upper GI/esophageal symptoms.  She describes somewhat of a globus sensation.  She is also describes spasming and occasional solid food dysphagia.  She has significant asthma and there is some wheezing today.  Query possible pulmonary etiology versus esophageal etiology.  She last had an EGD in 2015.  Chronic history of GERD.  Barium pill esophagram recently completed which found some smooth narrowing possibly sequela of previous GERD.  There is also some functional decline noted as well.  At this point given her ongoing symptoms we will plan for an upper endoscopy with possible dilation.  She is currently on Plavix with PMH of TIA.   We will ask the patient who manages her Plavix in order to get clearance to hold Plavix prior to her endoscopic procedure due to anticipated need of esophageal dilation.  This need to be held for 5 days.   Proceed with EGD +/- dilation on propofol/MAC with Dr. Gala Romney in near future: the risks, benefits, and alternatives have been discussed with the patient in detail. The patient states understanding and desires to proceed.  The patient is currently on Plavix, hydrocodone.  No other anticoagulants, anxiolytics, chronic pain medications, or antidepressants.  We will plan for the procedure on propofol/MAC to promote adequate sedation.

## 2018-08-19 NOTE — Patient Instructions (Signed)
Your health issues we discussed today were:   Irritable bowel syndrome with constipation: 1. I am glad you are doing better 2. Continue taking Amitiza 8 mcg twice daily 3. Call us if you have any severe worsening symptoms  Swallowing difficulties with a "odd sensation" in your swallowing tube and chronic cough with known GERD (reflux/heartburn): 1. We will schedule an upper endoscopy with possible dilation to evaluate and treat your symptoms 2. We will need to reach out to whoever manages your Plavix to discuss holding this for 5 days prior to your procedure 3. Call us if you have any severe worsening symptoms  Overall I recommend:  1. Continue taking your other medications 2. Call us if you have any questions or concerns 3. Return for follow-up in 4 months.   Because of recent events of COVID-19 ("Coronavirus"), follow CDC recommendations:  Wash your hand frequently Avoid touching your face Stay away from people who are sick If you have symptoms such as fever, cough, shortness of breath then call your healthcare provider for further guidance If you are sick, STAY AT HOME unless otherwise directed by your healthcare provider. Follow directions from state and national officials regarding staying safe   At [facility] we value your feedback. You may receive a survey about your visit today. Please share your experience as we strive to create trusting relationships with our patients to provide genuine, compassionate, quality care.  We appreciate your understanding and patience as we review any laboratory studies, imaging, and other diagnostic tests that are ordered as we care for you. Our office policy is 5 business days for review of these results, and any emergent or urgent results are addressed in a timely manner for your best interest. If you do not hear from our office in 1 week, please contact us.   We also encourage the use of MyChart, which contains your medical  information for your review as well. If you are not enrolled in this feature, an access code is on this after visit summary for your convenience. Thank you for allowing [place] to be involved in your care.  It was great to see you today!  I hope you have a great summer!!

## 2018-08-19 NOTE — Assessment & Plan Note (Signed)
IBS constipation type currently well managed on Amitiza 8 mcg twice daily.  Recommend she continue her current medication and follow-up as needed.

## 2018-08-19 NOTE — Assessment & Plan Note (Signed)
GERD symptoms currently well managed on Nexium.  Recommend she continue her current medications and follow-up as needed.

## 2018-08-19 NOTE — Progress Notes (Signed)
cc'ed to pcp °

## 2018-08-21 ENCOUNTER — Telehealth: Payer: Self-pay

## 2018-08-21 ENCOUNTER — Other Ambulatory Visit: Payer: Self-pay | Admitting: *Deleted

## 2018-08-21 DIAGNOSIS — K219 Gastro-esophageal reflux disease without esophagitis: Secondary | ICD-10-CM

## 2018-08-21 DIAGNOSIS — Z800 Family history of malignant neoplasm of digestive organs: Secondary | ICD-10-CM

## 2018-08-21 DIAGNOSIS — R1310 Dysphagia, unspecified: Secondary | ICD-10-CM

## 2018-08-21 DIAGNOSIS — R1319 Other dysphagia: Secondary | ICD-10-CM

## 2018-08-21 MED ORDER — CLENPIQ 10-3.5-12 MG-GM -GM/160ML PO SOLN: 1.0000 | Freq: Once | ORAL | 0 refills | Status: AC

## 2018-08-21 NOTE — Telephone Encounter (Signed)
Letter was faxed to Dr. Marcial Pacas office asking if it's ok for pt to hold Plavix 5 days prior to her TCS and EGD. Waiting on a response from PCP.

## 2018-08-21 NOTE — Telephone Encounter (Addendum)
Patient called back. TCS/EGD +/-DIL W/ propofol scheduled for 11/17/2018 at 12:00pm. Patient aware I will mail instructions with her pre-op appointment (confirmed address). She is also aware okay to hold plavix 5 days prior to procedure. Rx sent to [facility] [place] ([email or web address]). Orders entered  Randall Hiss, please advise on adjustments for diabetic medications. Thanks

## 2018-08-21 NOTE — Addendum Note (Signed)
Addended by: Inge Rise on: 08/21/2018 03:47 PM   Modules accepted: Orders

## 2018-08-21 NOTE — Telephone Encounter (Signed)
LMOVM

## 2018-08-21 NOTE — Telephone Encounter (Signed)
Received call from Dr. Marcial Pacas nurse from [facility], it's ok for pt to hold Plaix 5 days prior to her EGD/TCS.

## 2018-08-26 ENCOUNTER — Encounter: Payer: Self-pay | Admitting: *Deleted

## 2018-09-01 NOTE — Telephone Encounter (Signed)
I looked over her DM meds. Half night before, none morning of.

## 2018-09-01 NOTE — Telephone Encounter (Signed)
Instructions with pre-op have been mailed

## 2018-09-16 ENCOUNTER — Other Ambulatory Visit: Payer: Self-pay

## 2018-09-16 DIAGNOSIS — K5909 Other constipation: Secondary | ICD-10-CM

## 2018-09-16 DIAGNOSIS — K219 Gastro-esophageal reflux disease without esophagitis: Secondary | ICD-10-CM

## 2018-09-16 DIAGNOSIS — K581 Irritable bowel syndrome with constipation: Secondary | ICD-10-CM

## 2018-09-17 MED ORDER — LUBIPROSTONE 8 MCG PO CAPS: ORAL_CAPSULE | ORAL | 3 refills | Status: DC

## 2018-10-23 ENCOUNTER — Telehealth: Payer: Self-pay | Admitting: *Deleted

## 2018-10-23 NOTE — Telephone Encounter (Signed)
Patient called in asking for her prep instructions to be re mailed to her as she misplaced them. New instructions mailed.

## 2018-10-28 ENCOUNTER — Telehealth: Payer: Self-pay

## 2018-10-28 ENCOUNTER — Other Ambulatory Visit: Payer: Self-pay

## 2018-10-28 MED ORDER — CLENPIQ 10-3.5-12 MG-GM -GM/160ML PO SOLN: 1.0000 | Freq: Once | ORAL | 0 refills | Status: AC

## 2018-10-28 NOTE — Telephone Encounter (Signed)
Pt called office, pharmacy told her they didn't have rx for TCS prep. Rx resent to [facility].

## 2018-11-12 NOTE — Patient Instructions (Signed)
Your procedure is scheduled on: 11/17/2018  Report to [facility] at 7:00    AM.  Call this number if you have problems the morning of surgery: [phone]   Remember:              Follow Directions on the letter you received from Your Physician's office regarding the Bowel Prep  :  Take these medicines the morning of surgery with A SIP OF WATER: Amlodipine, Nexium,and Symbicort inhaler.  Use albuterol if needed.  Take only 1/2 dose of Levimir insulin 45 units the night before surgery.    Do not wear jewelry, make-up or nail polish.    Do not bring valuables to the hospital.  Contacts, dentures or bridgework may not be worn into surgery.  .   Patients discharged the day of surgery will not be allowed to drive home.     Colonoscopy, Adult, Care After This sheet gives you information about how to care for yourself after your procedure. Your health care provider may also give you more specific instructions. If you have problems or questions, contact your health care provider. What can I expect after the procedure? After the procedure, it is common to have:  A small amount of blood in your stool for 24 hours after the procedure.  Some gas.  Mild abdominal cramping or bloating.  Follow these instructions at home: General instructions   For the first 24 hours after the procedure: ? Do not drive or use machinery. ? Do not sign important documents. ? Do not drink alcohol. ? Do your regular daily activities at a slower pace than normal. ? Eat soft, easy-to-digest foods. ? Rest often.  Take over-the-counter or prescription medicines only as told by your health care provider.  It is up to you to get the results of your procedure. Ask your health care provider, or the department performing the procedure, when your results will be ready. Relieving cramping and bloating  Try walking around when you have cramps or feel bloated.  Apply heat to your abdomen as told by your health  care provider. Use a heat source that your health care provider recommends, such as a moist heat pack or a heating pad. ? Place a towel between your skin and the heat source. ? Leave the heat on for 20-30 minutes. ? Remove the heat if your skin turns bright red. This is especially important if you are unable to feel pain, heat, or cold. You may have a greater risk of getting burned. Eating and drinking  Drink enough fluid to keep your urine clear or pale yellow.  Resume your normal diet as instructed by your health care provider. Avoid heavy or fried foods that are hard to digest.  Avoid drinking alcohol for as long as instructed by your health care provider. Contact a health care provider if:  You have blood in your stool 2-3 days after the procedure. Get help right away if:  You have more than a small spotting of blood in your stool.  You pass large blood clots in your stool.  Your abdomen is swollen.  You have nausea or vomiting.  You have a fever.  You have increasing abdominal pain that is not relieved with medicine. This information is not intended to replace advice given to you by your health care provider. Make sure you discuss any questions you have with your health care provider. Document Released: 10/04/2003 Document Revised: 11/14/2015 Document Reviewed: 05/03/2015 Elsevier Interactive Patient Education  2018 [employer].  Upper Endoscopy, Adult, Care After This sheet gives you information about how to care for yourself after your procedure. Your health care provider may also give you more specific instructions. If you have problems or questions, contact your health care provider. What can I expect after the procedure? After the procedure, it is common to have:  A sore throat.  Mild stomach pain or discomfort.  Bloating.  Nausea. Follow these instructions at home:   Follow instructions from your health care provider about what to eat or drink after your  procedure.  Return to your normal activities as told by your health care provider. Ask your health care provider what activities are safe for you.  Take over-the-counter and prescription medicines only as told by your health care provider.  Do not drive for 24 hours if you were given a sedative during your procedure.  Keep all follow-up visits as told by your health care provider. This is important. Contact a health care provider if you have:  A sore throat that lasts longer than one day.  Trouble swallowing. Get help right away if:  You vomit blood or your vomit looks like coffee grounds.  You have: ? A fever. ? Bloody, black, or tarry stools. ? A severe sore throat or you cannot swallow. ? Difficulty breathing. ? Severe pain in your chest or abdomen. Summary  After the procedure, it is common to have a sore throat, mild stomach discomfort, bloating, and nausea.  Do not drive for 24 hours if you were given a sedative during the procedure.  Follow instructions from your health care provider about what to eat or drink after your procedure.  Return to your normal activities as told by your health care provider. This information is not intended to replace advice given to you by your health care provider. Make sure you discuss any questions you have with your health care provider. Document Released: 08/21/2011 Document Revised: 08/13/2017 Document Reviewed: 07/22/2017 Elsevier Patient Education  2020 [employer].

## 2018-11-13 ENCOUNTER — Other Ambulatory Visit: Payer: Self-pay

## 2018-11-13 ENCOUNTER — Encounter (HOSPITAL_COMMUNITY): Payer: Self-pay

## 2018-11-13 ENCOUNTER — Other Ambulatory Visit (HOSPITAL_COMMUNITY)
Admission: RE | Admit: 2018-11-13 | Discharge: 2018-11-13 | Disposition: A | Discharge status: Home / Self Care | Payer: Medicare Other | Source: Ambulatory Visit | Attending: Internal Medicine | Admitting: Internal Medicine

## 2018-11-13 ENCOUNTER — Encounter (HOSPITAL_COMMUNITY)
Admission: RE | Admit: 2018-11-13 | Discharge: 2018-11-13 | Disposition: A | Discharge status: Home / Self Care | Payer: Medicare Other | Source: Ambulatory Visit | Attending: Internal Medicine | Admitting: Internal Medicine

## 2018-11-13 DIAGNOSIS — Z20828 Contact with and (suspected) exposure to other viral communicable diseases: Secondary | ICD-10-CM | POA: Diagnosis not present

## 2018-11-13 DIAGNOSIS — Z01812 Encounter for preprocedural laboratory examination: Secondary | ICD-10-CM | POA: Diagnosis present

## 2018-11-13 LAB — SARS CORONAVIRUS 2 (TAT 6-24 HRS): SARS Coronavirus 2: NEGATIVE

## 2018-11-17 ENCOUNTER — Encounter (HOSPITAL_COMMUNITY)
Admission: RE | Disposition: A | Discharge status: Home / Self Care | Payer: Self-pay | Source: Home / Self Care | Attending: Internal Medicine

## 2018-11-17 ENCOUNTER — Ambulatory Visit (HOSPITAL_COMMUNITY)
Admission: RE | Admit: 2018-11-17 | Discharge: 2018-11-17 | Disposition: A | Discharge status: Home / Self Care | Payer: Medicare Other | Attending: Internal Medicine | Admitting: Internal Medicine

## 2018-11-17 ENCOUNTER — Encounter (HOSPITAL_COMMUNITY): Payer: Self-pay

## 2018-11-17 ENCOUNTER — Other Ambulatory Visit: Payer: Self-pay

## 2018-11-17 ENCOUNTER — Ambulatory Visit (HOSPITAL_COMMUNITY): Payer: Medicare Other | Admitting: Anesthesiology

## 2018-11-17 ENCOUNTER — Encounter: Payer: Self-pay | Admitting: Internal Medicine

## 2018-11-17 DIAGNOSIS — Z79899 Other long term (current) drug therapy: Secondary | ICD-10-CM | POA: Diagnosis not present

## 2018-11-17 DIAGNOSIS — Z1211 Encounter for screening for malignant neoplasm of colon: Secondary | ICD-10-CM | POA: Diagnosis not present

## 2018-11-17 DIAGNOSIS — Z7902 Long term (current) use of antithrombotics/antiplatelets: Secondary | ICD-10-CM | POA: Insufficient documentation

## 2018-11-17 DIAGNOSIS — K219 Gastro-esophageal reflux disease without esophagitis: Secondary | ICD-10-CM

## 2018-11-17 DIAGNOSIS — Z87891 Personal history of nicotine dependence: Secondary | ICD-10-CM | POA: Insufficient documentation

## 2018-11-17 DIAGNOSIS — Z7982 Long term (current) use of aspirin: Secondary | ICD-10-CM | POA: Insufficient documentation

## 2018-11-17 DIAGNOSIS — Z800 Family history of malignant neoplasm of digestive organs: Secondary | ICD-10-CM | POA: Insufficient documentation

## 2018-11-17 DIAGNOSIS — Z7951 Long term (current) use of inhaled steroids: Secondary | ICD-10-CM | POA: Diagnosis not present

## 2018-11-17 DIAGNOSIS — K640 First degree hemorrhoids: Secondary | ICD-10-CM | POA: Diagnosis not present

## 2018-11-17 DIAGNOSIS — Z8673 Personal history of transient ischemic attack (TIA), and cerebral infarction without residual deficits: Secondary | ICD-10-CM | POA: Diagnosis not present

## 2018-11-17 DIAGNOSIS — R1310 Dysphagia, unspecified: Secondary | ICD-10-CM | POA: Diagnosis present

## 2018-11-17 DIAGNOSIS — E119 Type 2 diabetes mellitus without complications: Secondary | ICD-10-CM | POA: Diagnosis not present

## 2018-11-17 DIAGNOSIS — I10 Essential (primary) hypertension: Secondary | ICD-10-CM | POA: Diagnosis not present

## 2018-11-17 DIAGNOSIS — G4730 Sleep apnea, unspecified: Secondary | ICD-10-CM | POA: Diagnosis not present

## 2018-11-17 DIAGNOSIS — Z794 Long term (current) use of insulin: Secondary | ICD-10-CM | POA: Diagnosis not present

## 2018-11-17 DIAGNOSIS — J449 Chronic obstructive pulmonary disease, unspecified: Secondary | ICD-10-CM | POA: Insufficient documentation

## 2018-11-17 DIAGNOSIS — R1319 Other dysphagia: Secondary | ICD-10-CM

## 2018-11-17 DIAGNOSIS — K319 Disease of stomach and duodenum, unspecified: Secondary | ICD-10-CM | POA: Insufficient documentation

## 2018-11-17 DIAGNOSIS — K3189 Other diseases of stomach and duodenum: Secondary | ICD-10-CM | POA: Diagnosis not present

## 2018-11-17 HISTORY — PX: MALONEY DILATION: SHX5535

## 2018-11-17 HISTORY — PX: ESOPHAGOGASTRODUODENOSCOPY (EGD) WITH PROPOFOL: SHX5813

## 2018-11-17 HISTORY — PX: COLONOSCOPY WITH PROPOFOL: SHX5780

## 2018-11-17 HISTORY — PX: BIOPSY: SHX5522

## 2018-11-17 LAB — GLUCOSE, CAPILLARY: Glucose-Capillary: 253 mg/dL — ABNORMAL HIGH (ref 70–99)

## 2018-11-17 SURGERY — COLONOSCOPY WITH PROPOFOL
Anesthesia: General

## 2018-11-17 MED ORDER — KETAMINE HCL 10 MG/ML IJ SOLN
INTRAMUSCULAR | Status: DC | PRN
  Administered 2018-11-17 (×3): 10 mg via INTRAVENOUS

## 2018-11-17 MED ORDER — LACTATED RINGERS IV SOLN
Freq: Once | INTRAVENOUS | Status: AC
  Administered 2018-11-17: 08:00:00 via INTRAVENOUS

## 2018-11-17 MED ORDER — IPRATROPIUM-ALBUTEROL 0.5-2.5 (3) MG/3ML IN SOLN
3.0000 mL | Freq: Once | RESPIRATORY_TRACT | Status: AC
  Administered 2018-11-17: 3 mL via RESPIRATORY_TRACT
  Filled 2018-11-17: qty 3

## 2018-11-17 MED ORDER — CHLORHEXIDINE GLUCONATE CLOTH 2 % EX PADS: 6.0000 | MEDICATED_PAD | Freq: Once | CUTANEOUS | Status: DC

## 2018-11-17 MED ORDER — PROPOFOL 10 MG/ML IV BOLUS
INTRAVENOUS | Status: DC | PRN
  Administered 2018-11-17: 10 mg via INTRAVENOUS

## 2018-11-17 MED ORDER — ONDANSETRON HCL 4 MG/2ML IJ SOLN: 4.0000 mg | Freq: Once | INTRAMUSCULAR | Status: DC | PRN

## 2018-11-17 MED ORDER — KETAMINE HCL 50 MG/5ML IJ SOSY
PREFILLED_SYRINGE | INTRAMUSCULAR | Status: AC
  Filled 2018-11-17: qty 5

## 2018-11-17 MED ORDER — PROPOFOL 500 MG/50ML IV EMUL
INTRAVENOUS | Status: DC | PRN
  Administered 2018-11-17: 150 ug/kg/min via INTRAVENOUS

## 2018-11-17 MED ORDER — LIDOCAINE HCL (CARDIAC) PF 100 MG/5ML IV SOSY
PREFILLED_SYRINGE | INTRAVENOUS | Status: DC | PRN
  Administered 2018-11-17: 40 mg via INTRAVENOUS

## 2018-11-17 MED ORDER — PHENYLEPHRINE HCL (PRESSORS) 10 MG/ML IV SOLN
INTRAVENOUS | Status: DC | PRN
  Administered 2018-11-17: 40 ug via INTRAVENOUS
  Administered 2018-11-17 (×2): 80 ug via INTRAVENOUS
  Administered 2018-11-17: 40 ug via INTRAVENOUS

## 2018-11-17 NOTE — Anesthesia Postprocedure Evaluation (Signed)
Anesthesia Post Note  Patient: Brittney Martinez  Procedure(s) Performed: COLONOSCOPY WITH PROPOFOL (N/A ) ESOPHAGOGASTRODUODENOSCOPY (EGD) WITH PROPOFOL (N/A ) MALONEY DILATION (N/A ) BIOPSY  Patient location during evaluation: PACU Anesthesia Type: General Level of consciousness: awake Pain management: pain level controlled Vital Signs Assessment: post-procedure vital signs reviewed and stable Respiratory status: spontaneous breathing Cardiovascular status: stable Postop Assessment: no apparent nausea or vomiting Anesthetic complications: no     Last Vitals:  Vitals:   11/17/18 0735 11/17/18 0914  BP: (!) 145/78 (P) 114/67  Pulse: 79 (P) 71  Resp: 18 (!) (P) 22  Temp:  (P) 36.7 C  SpO2: 97% (P) 94%    Last Pain:  Vitals:   11/17/18 0834  PainSc: 7                  Everette Rank

## 2018-11-17 NOTE — Discharge Instructions (Signed)
Hemorrhoids Hemorrhoids are swollen veins that may develop: In the butt (rectum). These are called internal hemorrhoids. Around the opening of the butt (anus). These are called external hemorrhoids. Hemorrhoids can cause pain, itching, or bleeding. Most of the time, they do not cause serious problems. They usually get better with diet changes, lifestyle changes, and other home treatments. What are the causes? This condition may be caused by: Having trouble pooping (constipation). Pushing hard (straining) to poop. Watery poop (diarrhea). Pregnancy. Being very overweight (obese). Sitting for long periods of time. Heavy lifting or other activity that causes you to strain. Anal sex. Riding a bike for a long period of time. What are the signs or symptoms? Symptoms of this condition include: Pain. Itching or soreness in the butt. Bleeding from the butt. Leaking poop. Swelling in the area. One or more lumps around the opening of your butt. How is this diagnosed? A doctor can often diagnose this condition by looking at the affected area. The doctor may also: Do an exam that involves feeling the area with a gloved hand (digital rectal exam). Examine the area inside your butt using a small tube (anoscope). Order blood tests. This may be done if you have lost a lot of blood. Have you get a test that involves looking inside the colon using a flexible tube with a camera on the end (sigmoidoscopy or colonoscopy). How is this treated? This condition can usually be treated at home. Your doctor may tell you to change what you eat, make lifestyle changes, or try home treatments. If these do not help, procedures can be done to remove the hemorrhoids or make them smaller. These may involve: Placing rubber bands at the base of the hemorrhoids to cut off their blood supply. Injecting medicine into the hemorrhoids to shrink them. Shining a type of light energy onto the hemorrhoids to cause them to fall  off. Doing surgery to remove the hemorrhoids or cut off their blood supply. Follow these instructions at home: Eating and drinking  Eat foods that have a lot of fiber in them. These include whole grains, beans, nuts, fruits, and vegetables. Ask your doctor about taking products that have added fiber (fibersupplements). Reduce the amount of fat in your diet. You can do this by: Eating low-fat dairy products. Eating less red meat. Avoiding processed foods. Drink enough fluid to keep your pee (urine) pale yellow. Managing pain and swelling  Take a warm-water bath (sitz bath) for 20 minutes to ease pain. Do this 3-4 times a day. You may do this in a bathtub or using a portable sitz bath that fits over the toilet. If told, put ice on the painful area. It may be helpful to use ice between your warm baths. Put ice in a plastic bag. Place a towel between your skin and the bag. Leave the ice on for 20 minutes, 2-3 times a day. General instructions Take over-the-counter and prescription medicines only as told by your doctor. Medicated creams and medicines may be used as told. Exercise often. Ask your doctor how much and what kind of exercise is best for you. Go to the bathroom when you have the urge to poop. Do not wait. Avoid pushing too hard when you poop. Keep your butt dry and clean. Use wet toilet paper or moist towelettes after pooping. Do not sit on the toilet for a long time. Keep all follow-up visits as told by your doctor. This is important. Contact a doctor if you: Have pain and  swelling that do not get better with treatment or medicine. Have trouble pooping. Cannot poop. Have pain or swelling outside the area of the hemorrhoids. Get help right away if you have: Bleeding that will not stop. Summary Hemorrhoids are swollen veins in the butt or around the opening of the butt. They can cause pain, itching, or bleeding. Eat foods that have a lot of fiber in them. These include  whole grains, beans, nuts, fruits, and vegetables. Take a warm-water bath (sitz bath) for 20 minutes to ease pain. Do this 3-4 times a day. This information is not intended to replace advice given to you by your health care provider. Make sure you discuss any questions you have with your health care provider. Document Released: 11/29/2007 Document Revised: 02/27/2018 Document Reviewed: 07/11/2017 Elsevier Patient Education  [place]. Gastroesophageal Reflux Disease, Adult Gastroesophageal reflux (GER) happens when acid from the stomach flows up into the tube that connects the mouth and the stomach (esophagus). Normally, food travels down the esophagus and stays in the stomach to be digested. However, when a person has GER, food and stomach acid sometimes move back up into the esophagus. If this becomes a more serious problem, the person may be diagnosed with a disease called gastroesophageal reflux disease (GERD). GERD occurs when the reflux: Happens often. Causes frequent or severe symptoms. Causes problems such as damage to the esophagus. When stomach acid comes in contact with the esophagus, the acid may cause soreness (inflammation) in the esophagus. Over time, GERD may create small holes (ulcers) in the lining of the esophagus. What are the causes? This condition is caused by a problem with the muscle between the esophagus and the stomach (lower esophageal sphincter, or LES). Normally, the LES muscle closes after food passes through the esophagus to the stomach. When the LES is weakened or abnormal, it does not close properly, and that allows food and stomach acid to go back up into the esophagus. The LES can be weakened by certain dietary substances, medicines, and medical conditions, including: Tobacco use. Pregnancy. Having a hiatal hernia. Alcohol use. Certain foods and beverages, such as coffee, chocolate, onions, and peppermint. What increases the risk? You are more likely to  develop this condition if you: Have an increased body weight. Have a connective tissue disorder. Use NSAID medicines. What are the signs or symptoms? Symptoms of this condition include: Heartburn. Difficult or painful swallowing. The feeling of having a lump in the throat. Abitter taste in the mouth. Bad breath. Having a large amount of saliva. Having an upset or bloated stomach. Belching. Chest pain. Different conditions can cause chest pain. Make sure you see your health care provider if you experience chest pain. Shortness of breath or wheezing. Ongoing (chronic) cough or a night-time cough. Wearing away of tooth enamel. Weight loss. How is this diagnosed? Your health care provider will take a medical history and perform a physical exam. To determine if you have mild or severe GERD, your health care provider may also monitor how you respond to treatment. You may also have tests, including: A test to examine your stomach and esophagus with a small camera (endoscopy). A test thatmeasures the acidity level in your esophagus. A test thatmeasures how much pressure is on your esophagus. A barium swallow or modified barium swallow test to show the shape, size, and functioning of your esophagus. How is this treated? The goal of treatment is to help relieve your symptoms and to prevent complications. Treatment for this condition  may vary depending on how severe your symptoms are. Your health care provider may recommend: Changes to your diet. Medicine. Surgery. Follow these instructions at home: Eating and drinking  Follow a diet as recommended by your health care provider. This may involve avoiding foods and drinks such as: Coffee and tea (with or without caffeine). Drinks that containalcohol. Energy drinks and sports drinks. Carbonated drinks or sodas. Chocolate and cocoa. Peppermint and mint flavorings. Garlic and onions. Horseradish. Spicy and acidic foods, including  peppers, chili powder, curry powder, vinegar, hot sauces, and barbecue sauce. Citrus fruit juices and citrus fruits, such as oranges, lemons, and limes. Tomato-based foods, such as red sauce, chili, salsa, and pizza with red sauce. Fried and fatty foods, such as donuts, french fries, potato chips, and high-fat dressings. High-fat meats, such as hot dogs and fatty cuts of red and white meats, such as rib eye steak, sausage, ham, and bacon. High-fat dairy items, such as whole milk, butter, and cream cheese. Eat small, frequent meals instead of large meals. Avoid drinking large amounts of liquid with your meals. Avoid eating meals during the 2-3 hours before bedtime. Avoid lying down right after you eat. Do not exercise right after you eat. Lifestyle  Do not use any products that contain nicotine or tobacco, such as cigarettes, e-cigarettes, and chewing tobacco. If you need help quitting, ask your health care provider. Try to reduce your stress by using methods such as yoga or meditation. If you need help reducing stress, ask your health care provider. If you are overweight, reduce your weight to an amount that is healthy for you. Ask your health care provider for guidance about a safe weight loss goal. General instructions Pay attention to any changes in your symptoms. Take over-the-counter and prescription medicines only as told by your health care provider. Do not take aspirin, ibuprofen, or other NSAIDs unless your health care provider told you to do so. Wear loose-fitting clothing. Do not wear anything tight around your waist that causes pressure on your abdomen. Raise (elevate) the head of your bed about 6 inches (15 cm). Avoid bending over if this makes your symptoms worse. Keep all follow-up visits as told by your health care provider. This is important. Contact a health care provider if: You have: New symptoms. Unexplained weight loss. Difficulty swallowing or it hurts to  swallow. Wheezing or a persistent cough. A hoarse voice. Your symptoms do not improve with treatment. Get help right away if you: Have pain in your arms, neck, jaw, teeth, or back. Feel sweaty, dizzy, or light-headed. Have chest pain or shortness of breath. Vomit and your vomit looks like blood or coffee grounds. Faint. Have stool that is bloody or black. Cannot swallow, drink, or eat. Summary Gastroesophageal reflux happens when acid from the stomach flows up into the esophagus. GERD is a disease in which the reflux happens often, causes frequent or severe symptoms, or causes problems such as damage to the esophagus. Treatment for this condition may vary depending on how severe your symptoms are. Your health care provider may recommend diet and lifestyle changes, medicine, or surgery. Contact a health care provider if you have new or worsening symptoms. Take over-the-counter and prescription medicines only as told by your health care provider. Do not take aspirin, ibuprofen, or other NSAIDs unless your health care provider told you to do so. Keep all follow-up visits as told by your health care provider. This is important. This information is not intended to replace advice given  to you by your health care provider. Make sure you discuss any questions you have with your health care provider. Document Released: 11/29/2004 Document Revised: 08/28/2017 Document Reviewed: 08/28/2017 Elsevier Patient Education  2020 [place].  Colonoscopy Discharge Instructions  Read the instructions outlined below and refer to this sheet in the next few weeks. These discharge instructions provide you with general information on caring for yourself after you leave the hospital. Your doctor may also give you specific instructions. While your treatment has been planned according to the most current medical practices available, unavoidable complications occasionally occur. If you have any problems or questions  after discharge, call Dr. Gala Romney at [phone]. ACTIVITY  You may resume your regular activity, but move at a slower pace for the next 24 hours.   Take frequent rest periods for the next 24 hours.   Walking will help get rid of the air and reduce the bloated feeling in your belly (abdomen).   No driving for 24 hours (because of the medicine (anesthesia) used during the test).    Do not sign any important legal documents or operate any machinery for 24 hours (because of the anesthesia used during the test).  NUTRITION  Drink plenty of fluids.   You may resume your normal diet as instructed by your doctor.   Begin with a light meal and progress to your normal diet. Heavy or fried foods are harder to digest and may make you feel sick to your stomach (nauseated).   Avoid alcoholic beverages for 24 hours or as instructed.  MEDICATIONS  You may resume your normal medications unless your doctor tells you otherwise.  WHAT YOU CAN EXPECT TODAY  Some feelings of bloating in the abdomen.   Passage of more gas than usual.   Spotting of blood in your stool or on the toilet paper.  IF YOU HAD POLYPS REMOVED DURING THE COLONOSCOPY:  No aspirin products for 7 days or as instructed.   No alcohol for 7 days or as instructed.   Eat a soft diet for the next 24 hours.  FINDING OUT THE RESULTS OF YOUR TEST Not all test results are available during your visit. If your test results are not back during the visit, make an appointment with your caregiver to find out the results. Do not assume everything is normal if you have not heard from your caregiver or the medical facility. It is important for you to follow up on all of your test results.  SEEK IMMEDIATE MEDICAL ATTENTION IF:  You have more than a spotting of blood in your stool.   Your belly is swollen (abdominal distention).   You are nauseated or vomiting.   You have a temperature over 101.   You have abdominal pain or discomfort that is  severe or gets worse throughout the day.  EGD Discharge instructions Please read the instructions outlined below and refer to this sheet in the next few weeks. These discharge instructions provide you with general information on caring for yourself after you leave the hospital. Your doctor may also give you specific instructions. While your treatment has been planned according to the most current medical practices available, unavoidable complications occasionally occur. If you have any problems or questions after discharge, please call your doctor. ACTIVITY  You may resume your regular activity but move at a slower pace for the next 24 hours.   Take frequent rest periods for the next 24 hours.   Walking will help expel (get rid of)  the air and reduce the bloated feeling in your abdomen.   No driving for 24 hours (because of the anesthesia (medicine) used during the test).   You may shower.   Do not sign any important legal documents or operate any machinery for 24 hours (because of the anesthesia used during the test).  NUTRITION  Drink plenty of fluids.   You may resume your normal diet.   Begin with a light meal and progress to your normal diet.   Avoid alcoholic beverages for 24 hours or as instructed by your caregiver.  MEDICATIONS  You may resume your normal medications unless your caregiver tells you otherwise.  WHAT YOU CAN EXPECT TODAY  You may experience abdominal discomfort such as a feeling of fullness or gas pains.  FOLLOW-UP  Your doctor will discuss the results of your test with you.  SEEK IMMEDIATE MEDICAL ATTENTION IF ANY OF THE FOLLOWING OCCUR:  Excessive nausea (feeling sick to your stomach) and/or vomiting.   Severe abdominal pain and distention (swelling).   Trouble swallowing.   Temperature over 101 F (37.8 C).   Rectal bleeding or vomiting of blood.    Stop Nexium; begin Protonix 40 mg twice daily  GERD information provided  Hemorrhoid  information provided  Follow-up on pathology  Repeat colonoscopy in 5 years  Office visit with [place] in 3 months  I called [phone] to speak with Ivin Booty at patient's request.  Got voicemail.  Did not leave a message.

## 2018-11-17 NOTE — Transfer of Care (Signed)
Immediate Anesthesia Transfer of Care Note  Patient: Brittney Martinez  Procedure(s) Performed: COLONOSCOPY WITH PROPOFOL (N/A ) ESOPHAGOGASTRODUODENOSCOPY (EGD) WITH PROPOFOL (N/A ) MALONEY DILATION (N/A ) BIOPSY  Patient Location: PACU  Anesthesia Type:MAC  Level of Consciousness: awake  Airway & Oxygen Therapy: Patient Spontanous Breathing  Post-op Assessment: Report given to RN and Post -op Vital signs reviewed and stable  Post vital signs: Reviewed and stable  Last Vitals:  Vitals Value Taken Time  BP 114/67 11/17/18 0915  Temp    Pulse 69 11/17/18 0916  Resp 24 11/17/18 0916  SpO2 94 % 11/17/18 0916  Vitals shown include unvalidated device data.  Last Pain:  Vitals:   11/17/18 0834  PainSc: 7          Complications: No apparent anesthesia complications

## 2018-11-17 NOTE — Anesthesia Preprocedure Evaluation (Signed)
Anesthesia Evaluation  Patient identified by MRN, date of birth, ID band Patient awake    Reviewed: Allergy & Precautions, NPO status   History of Anesthesia Complications (+) PONV and history of anesthetic complications  Airway Mallampati: III  TM Distance: >3 FB Neck ROM: Full    Dental  (+) Edentulous Lower, Edentulous Upper   Pulmonary asthma , sleep apnea and Continuous Positive Airway Pressure Ventilation , COPD, Patient abstained from smoking., former smoker,    breath sounds clear to auscultation       Cardiovascular hypertension, Pt. on medications and Pt. on home beta blockers  Rhythm:Regular Rate:Normal     Neuro/Psych PSYCHIATRIC DISORDERS Anxiety Depression TIACVA    GI/Hepatic GERD  Medicated,  Endo/Other  diabetes, Type 2, Insulin Dependent, Oral Hypoglycemic Agents  Renal/GU      Musculoskeletal  (+) Arthritis ,   Abdominal   Peds  Hematology   Anesthesia Other Findings   Reproductive/Obstetrics                             Anesthesia Physical Anesthesia Plan  ASA: III  Anesthesia Plan: General   Post-op Pain Management:    Induction: Intravenous  PONV Risk Score and Plan:   Airway Management Planned: Simple Face Mask, Nasal Cannula and Natural Airway  Additional Equipment:   Intra-op Plan:   Post-operative Plan:   Informed Consent: I have reviewed the patients History and Physical, chart, labs and discussed the procedure including the risks, benefits and alternatives for the proposed anesthesia with the patient or authorized representative who has indicated his/her understanding and acceptance.       Plan Discussed with: CRNA  Anesthesia Plan Comments:         Anesthesia Quick Evaluation

## 2018-11-17 NOTE — H&P (Signed)
@LOGO @   Primary Care Physician:  Glenda Chroman, MD Primary Gastroenterologist:  Dr. Gala Romney  Pre-Procedure History & Physical: HPI:  Brittney Martinez is a 63 y.o. female here for here for further evaluation of somewhat refractory GERD and esophageal dysphagia.  Family history unfortunately positive now and her father with colon cancer; also here for high rescreening colonoscopy at this time.  Past Medical History:  Diagnosis Date  . Abdominal aortic stenosis   . Anal fissure   . Anxiety   . Asthma   . Complication of anesthesia   . COPD (chronic obstructive pulmonary disease) ([place])   . Depression    grief  . Diabetes mellitus   . Diverticulitis   . GERD (gastroesophageal reflux disease)   . Glaucoma   . Gout   . Hypertension   . IBS (irritable bowel syndrome)   . PONV (postoperative nausea and vomiting)   . RA (rheumatoid arthritis) ([place])   . Sleep apnea    had one at one time but not anymore  . Stroke [facility])    "mini-stroke"- no deficits  . TIA (transient ischemic attack)    2010. No deficits    Past Surgical History:  Procedure Laterality Date  . ABDOMINAL HYSTERECTOMY  1982   complete  . BREAST LUMPECTOMY     right-benign  . CATARACT EXTRACTION W/PHACO Right 05/13/2017   Procedure: CATARACT EXTRACTION PHACO AND INTRAOCULAR LENS PLACEMENT (IOC);  Surgeon: Tonny Branch, MD;  Location: [facility];  Service: Ophthalmology;  Laterality: Right;  CDE: 8.59  . CATARACT EXTRACTION W/PHACO Left 05/27/2017   Procedure: CATARACT EXTRACTION WITH  PHACOEMULSIFICATION AND INTRAOCULAR LENS PLACEMENT LEFT EYE;  Surgeon: Tonny Branch, MD;  Location: [facility];  Service: Ophthalmology;  Laterality: Left;  CDE: 5.66  . CHOLECYSTECTOMY  2003  . COLONOSCOPY  12/14/09   Dr. Gala Romney :anal papilla and hemorrhoids,diminutive hperplastic rectal polyps/normal colon  . COLONOSCOPY N/A 10/29/2013   SAY:TKZS papilla and internal hemorrhoids; colonic polyps-removed as described above. I suspect benign anorectal  bleeding in the setting of hemorrhoids and  possibly fissure. tubular adenoma. next TCS 10/2020.  . ELBOW SURGERY     right  . ESOPHAGOGASTRODUODENOSCOPY  12/14/09   Dr. Gala Romney :schatzkis ring 85F, otherwise normal  . ESOPHAGOGASTRODUODENOSCOPY N/A 10/29/2013   RMR: Distal esophageal pseudodiverticulum/Nissen fundoplication  . FINGER SURGERY     on right middle, pointer, and index fingers  . FISSURECTOMY     several  . INCISIONAL HERNIA REPAIR N/A 03/22/2014   Procedure: [name] HERNIORRHAPHY;  Surgeon: Jamesetta So, MD;  Location: [facility];  Service: General;  Laterality: N/A;  . INSERTION OF MESH N/A 03/22/2014   Procedure: INSERTION OF MESH;  Surgeon: Jamesetta So, MD;  Location: [facility];  Service: General;  Laterality: N/A;  . NISSEN FUNDOPLICATION  0109   [facility] [place]  . WRIST SURGERY     left    Prior to Admission medications   Medication Sig Start Date End Date Taking? Authorizing Provider  albuterol (PROVENTIL HFA;VENTOLIN HFA) 108 (90 BASE) MCG/ACT inhaler Inhale 2 puffs into the lungs See admin instructions. Take 2 puffs twice a day and every six hours as needed for shortness of breath.   Yes [provider]  albuterol (PROVENTIL) (2.5 MG/3ML) 0.083% nebulizer solution Take 2.5 mg by nebulization every 6 (six) hours as needed for wheezing or shortness of breath.    Yes [provider]  allopurinol (ZYLOPRIM) 300 MG tablet Take 300 mg by mouth daily.  Yes [provider]  amLODipine (NORVASC) 5 MG tablet Take 5 mg by mouth every morning.  03/08/18  Yes [provider]  aspirin 81 MG tablet Take 81 mg by mouth daily.   Yes [provider]  atorvastatin (LIPITOR) 10 MG tablet Take 10 mg by mouth at bedtime.    Yes [provider]  budesonide-formoterol (SYMBICORT) 160-4.5 MCG/ACT inhaler Inhale 1 puff into the lungs 2 (two) times daily.    Yes [provider]  calcium carbonate (OS-CAL) 600 MG TABS Take 600 mg by mouth 2  (two) times daily with a meal.   Yes [provider]  clopidogrel (PLAVIX) 75 MG tablet Take 75 mg by mouth daily.     Yes [provider]  cycloSPORINE (RESTASIS) 0.05 % ophthalmic emulsion Place 1 drop into both eyes 2 (two) times daily.     Yes [provider]  doxepin (SINEQUAN) 75 MG capsule Take 75 mg by mouth at bedtime.     Yes [provider]  esomeprazole (NEXIUM) 40 MG capsule Take 1 capsule (40 mg total) by mouth 2 (two) times daily before a meal. 05/12/14  Yes Mahala Menghini, PA-C  etanercept (ENBREL) 50 MG/ML injection Inject 50 mg into the skin every Thursday.   Yes [provider]  folic acid (FOLVITE) 1 MG tablet Take 1 mg by mouth daily.    Yes [provider]  glipiZIDE (GLUCOTROL) 10 MG tablet Take 10 mg by mouth 2 (two) times daily before a meal.   Yes [provider]  hyoscyamine (LEVSIN SL) 0.125 MG SL tablet Place 1 tablet (0.125 mg total) under the tongue every 6 (six) hours as needed for cramping. 11/19/17  Yes Carlis Stable, NP  ibuprofen (ADVIL,MOTRIN) 200 MG tablet Take 400 mg by mouth every 6 (six) hours as needed for headache or moderate pain.   Yes [provider]  insulin detemir (LEVEMIR) 100 UNIT/ML injection Inject 90 Units into the skin daily.    Yes [provider]  methotrexate (RHEUMATREX) 2.5 MG tablet Take 7.5 mg by mouth See admin instructions. Take 3 tablets (7.5 mg) by mouth twice weekly on Thursday and Fridays.  Caution:Chemotherapy. Protect from light.   Yes [provider]  metoprolol (TOPROL-XL) 100 MG 24 hr tablet Take 100 mg by mouth every morning.    Yes [provider]  famotidine (PEPCID) 20 MG tablet Take 1 tablet (20 mg total) by mouth 2 (two) times daily. Patient taking differently: Take 20 mg by mouth 2 (two) times daily as needed for heartburn.  01/09/18   Rolland Porter, MD  hydrocortisone (ANUSOL-HC) 2.5 % rectal cream Place 1 application rectally 2  (two) times daily. Patient taking differently: Place 1 application rectally 2 (two) times daily as needed for hemorrhoids or anal itching.  11/01/14   Mahala Menghini, PA-C  lubiprostone (AMITIZA) 8 MCG capsule TAKE 1 CAPSULE BY MOUTH ONCE DAILY WITH  BREAKFAST Patient taking differently: Take 8 mcg by mouth daily with breakfast.  09/17/18   Carlis Stable, NP  nitroGLYCERIN (NITROGLYN) 2 % ointment Apply 1 inch topically every 8 (eight) hours as needed (hemorrhoids).    [provider]    Allergies as of 08/21/2018 - Review Complete 08/19/2018  Allergen Reaction Noted  . Aspirin Nausea Only   . Propoxyphene n-acetaminophen Nausea And Vomiting   . Zithromax [azithromycin] Nausea Only 10/01/2010  . Erythromycin Rash 02/05/2014    Family History  Problem Relation Age  of Onset  . Colon cancer Father 69  . Hypertension Sister   . Hypertension Brother   . Kidney disease Brother     Social History   Socioeconomic History  . Marital status: Widowed    Spouse name: Not on file  . Number of children: Not on file  . Years of education: Not on file  . Highest education level: Not on file  Occupational History  . Occupation: disabled  Social Needs  . Financial resource strain: Not on file  . Food insecurity    Worry: Not on file    Inability: Not on file  . Transportation needs    Medical: Not on file    Non-medical: Not on file  Tobacco Use  . Smoking status: Former Smoker    Packs/day: 0.25    Years: 19.00    Pack years: 4.75    Types: Cigarettes    Quit date: 01/09/2007    Years since quitting: 11.8  . Smokeless tobacco: Never Used  Substance and Sexual Activity  . Alcohol use: No  . Drug use: No  . Sexual activity: Never  Lifestyle  . Physical activity    Days per week: Not on file    Minutes per session: Not on file  . Stress: Not on file  Relationships  . Social Herbalist on phone: Not on file    Gets together: Not on file    Attends religious  service: Not on file    Active member of club or organization: Not on file    Attends meetings of clubs or organizations: Not on file    Relationship status: Not on file  . Intimate partner violence    Fear of current or ex partner: Not on file    Emotionally abused: Not on file    Physically abused: Not on file    Forced sexual activity: Not on file  Other Topics Concern  . Not on file  Social History Narrative   1 son-healthy   1 daughter-MVA (drunk-driver)    Review of Systems: See HPI, otherwise negative ROS  Physical Exam: BP (!) 145/78   Pulse 79   Resp 18   Ht 5' 2.5" (1.588 m)   Wt 88.5 kg   SpO2 97%   BMI 35.10 kg/m  General:   Alert,  Well-developed, well-nourished, pleasant and cooperative in NAD Neck:  Supple; no masses or thyromegaly. No significant cervical adenopathy. Lungs:  Clear throughout to auscultation.   No wheezes, crackles, or rhonchi. No acute distress. Heart:  Regular rate and rhythm; no murmurs, clicks, rubs,  or gallops. Abdomen: Non-distended, normal bowel sounds.  Soft and nontender without appreciable mass or hepatosplenomegaly.  Pulses:  Normal pulses noted. Extremities:  Without clubbing or edema.  Impression/Plan: 63 year old lady with longstanding GERD now with recurrent esophageal dysphagia.  Positive family history of colon cancer-here for high rescreening colonoscopy. The risks, benefits, limitations, imponderables and alternatives regarding both EGD and colonoscopy have been reviewed with the patient. Questions have been answered. All parties agreeable.      Notice: This dictation was prepared with Dragon dictation along with smaller phrase technology. Any transcriptional errors that result from this process are unintentional and may not be corrected upon review.

## 2018-11-17 NOTE — Op Note (Signed)
[facility] Patient Name: Brittney Martinez Procedure Date: 11/17/2018 8:44 AM MRN: [id number] Date of Birth: 1955-09-10 Attending MD: Norvel Richards , MD CSN: [id number] Age: 63 Admit Type: Outpatient Procedure:                Colonoscopy Indications:              Screening in patient at increased risk: Family                            history of 1st-degree relative with colorectal                            cancer Providers:                Norvel Richards, MD, Janeece Riggers, RN, Thomas Hoff., Technician Referring MD:              Medicines:                Propofol per Anesthesia Complications:            No immediate complications. Estimated Blood Loss:     Estimated blood loss: none. Estimated blood loss:                            none. Procedure:                Pre-Anesthesia Assessment:                           - Prior to the procedure, a History and Physical                            was performed, and patient medications and                            allergies were reviewed. The patient's tolerance of                            previous anesthesia was also reviewed. The risks                            and benefits of the procedure and the sedation                            options and risks were discussed with the patient.                            All questions were answered, and informed consent                            was obtained. Prior Anticoagulants: The patient has                            taken no previous anticoagulant or  antiplatelet                            agents. ASA Grade Assessment: II - A patient with                            mild systemic disease. After reviewing the risks                            and benefits, the patient was deemed in                            satisfactory condition to undergo the procedure.                           After obtaining informed consent, the colonoscope               was passed under direct vision. Throughout the                            procedure, the patient's blood pressure, pulse, and                            oxygen saturations were monitored continuously. The                            CF-HQ190L ([id number]) scope was introduced through                            the and advanced to the the cecum, identified by                            appendiceal orifice and ileocecal valve. The                            colonoscopy was performed without difficulty. The                            patient tolerated the procedure well. The quality                            of the bowel preparation was adequate. Scope In: 8:50:12 AM Scope Out: 9:05:22 AM Scope Withdrawal Time: 0 hours 10 minutes 35 seconds  Total Procedure Duration: 0 hours 15 minutes 10 seconds  Findings:      The perianal and digital rectal examinations were normal.      Non-bleeding internal hemorrhoids were found during retroflexion. The       hemorrhoids were moderate, medium-sized and Grade I (internal       hemorrhoids that do not prolapse).      The exam was otherwise without abnormality on direct and retroflexion       views. Impression:               - Non-bleeding internal hemorrhoids.                           -  The examination was otherwise normal on direct                            and retroflexion views.                           - No specimens collected. Moderate Sedation:      Moderate (conscious) sedation was personally administered by an       anesthesia professional. The following parameters were monitored: oxygen       saturation, heart rate, blood pressure, respiratory rate, EKG, adequacy       of pulmonary ventilation, and response to care. Recommendation:           - Written discharge instructions were provided to                            the patient.                           - The signs and symptoms of potential delayed                             complications were discussed with the patient.                           - Repeat colonoscopy in 5 years for screening                            purposes. See EGD report. Office visit with [place] in 3                            months Procedure Code(s):        --- Professional ---                           [phone], Colonoscopy, flexible; diagnostic, including                            collection of specimen(s) by brushing or washing,                            when performed (separate procedure) Diagnosis Code(s):        --- Professional ---                           Z80.0, Family history of malignant neoplasm of                            digestive organs                           K64.0, First degree hemorrhoids CPT copyright 2019 American [employer] Association. All rights reserved. The codes documented in this report are preliminary and upon coder review may  be revised to meet current compliance requirements. Cristopher Estimable. Chinyere Galiano, MD Norvel Richards, MD 11/17/2018 9:20:07 AM This report has been signed electronically. Number of Addenda:  0 

## 2018-11-17 NOTE — Op Note (Signed)
[facility] Patient Name: Brittney Martinez Procedure Date: 11/17/2018 8:12 AM MRN: [id number] Date of Birth: 03-Jan-1956 Attending MD: Norvel Richards , MD CSN: [id number] Age: 63 Admit Type: Outpatient Procedure:                Upper GI endoscopy Indications:              Dysphagia; refractory GERD Providers:                Norvel Richards, MD, Janeece Riggers, RN, Raphael Gibney, Technician Referring MD:              Medicines:                Propofol per Anesthesia Complications:            No immediate complications. Estimated Blood Loss:     Estimated blood loss was minimal. Procedure:                Pre-Anesthesia Assessment:                           - Prior to the procedure, a History and Physical                            was performed, and patient medications and                            allergies were reviewed. The patient's tolerance of                            previous anesthesia was also reviewed. The risks                            and benefits of the procedure and the sedation                            options and risks were discussed with the patient.                            All questions were answered, and informed consent                            was obtained. Prior Anticoagulants: The patient has                            taken no previous anticoagulant or antiplatelet                            agents. ASA Grade Assessment: II - A patient with                            mild systemic disease. After reviewing the risks  and benefits, the patient was deemed in                            satisfactory condition to undergo the procedure.                           After obtaining informed consent, the endoscope was                            passed under direct vision. Throughout the                            procedure, the patient's blood pressure, pulse, and                            oxygen  saturations were monitored continuously. The                            [id number]-[id number] ([id number]) scope was introduced through the                            mouth, and advanced to the second part of duodenum.                            The upper GI endoscopy was accomplished without                            difficulty. The patient tolerated the procedure                            well. Scope In: 8:33:42 AM Scope Out: 8:42:42 AM Total Procedure Duration: 0 hours 9 minutes 0 seconds  Findings:      The examined esophagus was normal.      Multiple localized, non-bleeding erosions were found in the gastric       antrum. No ulcer or infiltrating process seen.      The duodenal bulb and second portion of the duodenum were normal. The       scope was withdrawn. Dilation was performed with a Maloney dilator with       mild resistance at 56 Fr. The dilation site was examined following       endoscope reinsertion and showed no change. Estimated blood loss: none.       Finally, biopsies of the gastric mucosa taken. Impression:               - Normal esophagus. Dilated.                           - Non-bleeding erosive gastropathy. Status post                            biopsy                           - Normal duodenal bulb and second portion of the  duodenum.                           - Moderate Sedation:      Moderate (conscious) sedation was personally administered by an       anesthesia professional. The following parameters were monitored: oxygen       saturation, heart rate, blood pressure, respiratory rate, EKG, adequacy       of pulmonary ventilation, and response to care. Recommendation:           - Patient has a contact number available for                            emergencies. The signs and symptoms of potential                            delayed complications were discussed with the                            patient. Return to normal activities tomorrow.                             Written discharge instructions were provided to the                            patient.                           - Advance diet as tolerated. Stop Nexium; trial of                            Protonix 40 mg twice daily hemicolon office visit                            with [place] in 3 months. Follow-up pathology Procedure Code(s):        --- Professional ---                           [phone], Esophagogastroduodenoscopy, flexible,                            transoral; diagnostic, including collection of                            specimen(s) by brushing or washing, when performed                            (separate procedure)                           43450, Dilation of esophagus, by unguided sound or                            bougie, single or multiple passes Diagnosis Code(s):        --- Professional ---  K31.89, Other diseases of stomach and duodenum                           R13.10, Dysphagia, unspecified CPT copyright 2019 American Medical Association. All rights reserved. The codes documented in this report are preliminary and upon coder review may  be revised to meet current compliance requirements. Cristopher Estimable. Devonia Farro, MD Norvel Richards, MD 11/17/2018 8:47:58 AM This report has been signed electronically. Number of Addenda: 0

## 2018-11-20 ENCOUNTER — Encounter (HOSPITAL_COMMUNITY): Payer: Self-pay | Admitting: Internal Medicine

## 2018-12-13 ENCOUNTER — Emergency Department (HOSPITAL_COMMUNITY)
Admission: EM | Admit: 2018-12-13 | Discharge: 2018-12-13 | Disposition: A | Discharge status: Home / Self Care | Payer: Medicare Other | Attending: Emergency Medicine | Admitting: Emergency Medicine

## 2018-12-13 ENCOUNTER — Encounter (HOSPITAL_COMMUNITY): Payer: Self-pay | Admitting: *Deleted

## 2018-12-13 ENCOUNTER — Other Ambulatory Visit: Payer: Self-pay

## 2018-12-13 ENCOUNTER — Emergency Department (HOSPITAL_COMMUNITY): Payer: Medicare Other

## 2018-12-13 DIAGNOSIS — J449 Chronic obstructive pulmonary disease, unspecified: Secondary | ICD-10-CM | POA: Insufficient documentation

## 2018-12-13 DIAGNOSIS — Z8673 Personal history of transient ischemic attack (TIA), and cerebral infarction without residual deficits: Secondary | ICD-10-CM | POA: Diagnosis not present

## 2018-12-13 DIAGNOSIS — M5432 Sciatica, left side: Secondary | ICD-10-CM | POA: Insufficient documentation

## 2018-12-13 DIAGNOSIS — Z79899 Other long term (current) drug therapy: Secondary | ICD-10-CM | POA: Diagnosis not present

## 2018-12-13 DIAGNOSIS — M79605 Pain in left leg: Secondary | ICD-10-CM | POA: Insufficient documentation

## 2018-12-13 DIAGNOSIS — I10 Essential (primary) hypertension: Secondary | ICD-10-CM | POA: Diagnosis not present

## 2018-12-13 DIAGNOSIS — E119 Type 2 diabetes mellitus without complications: Secondary | ICD-10-CM | POA: Insufficient documentation

## 2018-12-13 DIAGNOSIS — Z87891 Personal history of nicotine dependence: Secondary | ICD-10-CM | POA: Diagnosis not present

## 2018-12-13 DIAGNOSIS — Z794 Long term (current) use of insulin: Secondary | ICD-10-CM | POA: Diagnosis not present

## 2018-12-13 DIAGNOSIS — Z7982 Long term (current) use of aspirin: Secondary | ICD-10-CM | POA: Diagnosis not present

## 2018-12-13 MED ORDER — METHOCARBAMOL 500 MG PO TABS: 500.0000 mg | ORAL_TABLET | Freq: Four times a day (QID) | ORAL | 0 refills | Status: DC

## 2018-12-13 MED ORDER — HYDROCODONE-ACETAMINOPHEN 5-325 MG PO TABS: 1.0000 | ORAL_TABLET | ORAL | 0 refills | Status: DC | PRN

## 2018-12-13 NOTE — Discharge Instructions (Addendum)
See your Physician for recheck on Monday °

## 2018-12-13 NOTE — ED Triage Notes (Signed)
Pt states she started having left leg pain yesterday. Reports starts in her low back and radiates down to her knee. Describes the pain as throbbing. Denies any recent injury. Reports she took a muscle relaxer last night without relief. Pt denies any known hx of sciatica.

## 2018-12-13 NOTE — ED Notes (Signed)
To CT

## 2018-12-13 NOTE — ED Provider Notes (Addendum)
[facility] EMERGENCY DEPARTMENT Provider Note   CSN: [id number] Arrival date & time: 12/13/18  1403     History   Chief Complaint Chief Complaint  Patient presents with  . Leg Pain    left    HPI Brittney Martinez is a 63 y.o. female.     The history is provided by the patient. No language interpreter was used.  Leg Pain Location:  Buttock Time since incident:  2 days Injury: no   Buttock location:  L buttock Pain details:    Quality:  Aching   Radiates to:  Does not radiate   Severity:  Moderate   Onset quality:  Gradual   Timing:  Constant   Progression:  Worsening Chronicity:  New Dislocation: no   Prior injury to area:  No Relieved by:  Nothing Worsened by:  Nothing Ineffective treatments:  None tried Associated symptoms: no back pain   Risk factors: no concern for non-accidental trauma    Pt reports she has pain in right posterior leg and buttock.  Pt reports some pain comes around to left groin area.  Pt reports she recently had a yeast infection and was treated with diflucan.  Pt reports pain with walking and moving.  Pt denies abdominal pain.  Pt reports she has a history of gout and arthritis.  No shortness of breath.  Pt denies any  Past Medical History:  Diagnosis Date  . Abdominal aortic stenosis   . Anal fissure   . Anxiety   . Asthma   . Complication of anesthesia   . COPD (chronic obstructive pulmonary disease) ([place])   . Depression    grief  . Diabetes mellitus   . Diverticulitis   . GERD (gastroesophageal reflux disease)   . Glaucoma   . Gout   . Hypertension   . IBS (irritable bowel syndrome)   . PONV (postoperative nausea and vomiting)   . RA (rheumatoid arthritis) ([place])   . Sleep apnea    had one at one time but not anymore  . Stroke [facility])    "mini-stroke"- no deficits  . TIA (transient ischemic attack)    2010. No deficits    Patient Active Problem List   Diagnosis Date Noted  . Rheumatoid arthritis ([place]) 03/20/2018  .  Hypokalemia 03/20/2018  . Type 2 diabetes mellitus without complication ([place]) 69/67/8938  . COPD with acute bronchitis ([place]) 03/20/2018  . Acute respiratory failure with hypoxia ([place]) 03/20/2018  . HTN (hypertension) 03/20/2018  . OSA on CPAP 03/20/2018  . Influenza B 03/19/2018  . Diarrhea 01/24/2016  . Melena 11/01/2014  . Incisional hernia 03/22/2014  . Hemorrhoids 02/05/2014  . Periumbilical hernia 12/19/5100  . Fatty liver 02/05/2014  . Anorectal fissure 11/30/2013  . IBS (irritable bowel syndrome) 09/29/2013  . Heme + stool 09/29/2013  . Rectal pain 09/29/2013  . Proctalgia 01/09/2011  . Obesity 01/09/2011  . Family hx of colon cancer 01/09/2011  . Intertriginous candidiasis 01/09/2011  . GERD 11/15/2009  . Constipation 11/15/2009  . RECTAL BLEEDING 11/15/2009  . Dysphagia 11/15/2009  . EPIGASTRIC PAIN 11/15/2009    Past Surgical History:  Procedure Laterality Date  . ABDOMINAL HYSTERECTOMY  1982   complete  . BIOPSY  11/17/2018   Procedure: BIOPSY;  Surgeon: Daneil Dolin, MD;  Location: [facility] ENDO SUITE;  Service: Endoscopy;;  . BREAST LUMPECTOMY     right-benign  . CATARACT EXTRACTION W/PHACO Right 05/13/2017   Procedure: CATARACT EXTRACTION PHACO AND INTRAOCULAR LENS PLACEMENT (  [place]);  Surgeon: Tonny Branch, MD;  Location: [facility] ORS;  Service: Ophthalmology;  Laterality: Right;  CDE: 8.59  . CATARACT EXTRACTION W/PHACO Left 05/27/2017   Procedure: CATARACT EXTRACTION WITH  PHACOEMULSIFICATION AND INTRAOCULAR LENS PLACEMENT LEFT EYE;  Surgeon: Tonny Branch, MD;  Location: [facility] ORS;  Service: Ophthalmology;  Laterality: Left;  CDE: 5.66  . CHOLECYSTECTOMY  2003  . COLONOSCOPY  12/14/09   Dr. Gala Romney :anal papilla and hemorrhoids,diminutive hperplastic rectal polyps/normal colon  . COLONOSCOPY N/A 10/29/2013   TIR:WERX papilla and internal hemorrhoids; colonic polyps-removed as described above. I suspect benign anorectal bleeding in the setting of hemorrhoids and  possibly  fissure. tubular adenoma. next TCS 10/2020.  Marland Kitchen COLONOSCOPY WITH PROPOFOL N/A 11/17/2018   Procedure: COLONOSCOPY WITH PROPOFOL;  Surgeon: Daneil Dolin, MD;  Location: [facility] ENDO SUITE;  Service: Endoscopy;  Laterality: N/A;  8:30am  . ELBOW SURGERY     right  . ESOPHAGOGASTRODUODENOSCOPY  12/14/09   Dr. Gala Romney :schatzkis ring 68F, otherwise normal  . ESOPHAGOGASTRODUODENOSCOPY N/A 10/29/2013   RMR: Distal esophageal pseudodiverticulum/Nissen fundoplication  . ESOPHAGOGASTRODUODENOSCOPY (EGD) WITH PROPOFOL N/A 11/17/2018   Procedure: ESOPHAGOGASTRODUODENOSCOPY (EGD) WITH PROPOFOL;  Surgeon: Daneil Dolin, MD;  Location: [facility] END[facility] SUITE;  Service: Endoscopy;  Laterality: N/A;  . FINGER SURGERY     on right middle, pointer, and index fingers  . FISSURECTOMY     several  . INCISIONAL HERNIA REPAIR N/A 03/22/2014   Procedure: [name] HERNIORRHAPHY;  Surgeon: Jamesetta So, MD;  Location: [facility] ORS;  Service: General;  Laterality: N/A;  . INSERTION OF MESH N/A 03/22/2014   Procedure: INSERTION OF MESH;  Surgeon: Jamesetta So, MD;  Location: [facility] ORS;  Service: General;  Laterality: N/A;  [name] DILATION N/A 11/17/2018   Procedure: Keturah Shavers;  Surgeon: Daneil Dolin, MD;  Location: [facility] ENDO SUITE;  Service: Endoscopy;  Laterality: N/A;  . NISSEN FUNDOPLICATION  [address] [place]  . WRIST SURGERY     left     OB History    Gravida  2   Para  2   Term  2   Preterm      AB      Living  2     SAB      TAB      Ectopic      Multiple      Live Births               Home Medications    Prior to Admission medications   Medication Sig Start Date End Date Taking? Authorizing Provider  albuterol (PROVENTIL HFA;VENTOLIN HFA) 108 (90 BASE) MCG/ACT inhaler Inhale 2 puffs into the lungs See admin instructions. Take 2 puffs twice a day and every six hours as needed for shortness of breath.    [provider]  albuterol (PROVENTIL) (2.5 MG/3ML) 0.083% nebulizer  solution Take 2.5 mg by nebulization every 6 (six) hours as needed for wheezing or shortness of breath.     [provider]  allopurinol (ZYLOPRIM) 300 MG tablet Take 300 mg by mouth daily.    [provider]  amLODipine (NORVASC) 5 MG tablet Take 5 mg by mouth every morning.  03/08/18   [provider]  aspirin 81 MG tablet Take 81 mg by mouth daily.    [provider]  atorvastatin (LIPITOR) 10 MG tablet Take 10 mg by mouth at bedtime.     [provider]  budesonide-formoterol (SYMBICORT) 160-4.5 MCG/ACT  inhaler Inhale 1 puff into the lungs 2 (two) times daily.     [provider]  calcium carbonate (OS-CAL) 600 MG TABS Take 600 mg by mouth 2 (two) times daily with a meal.    [provider]  clopidogrel (PLAVIX) 75 MG tablet Take 75 mg by mouth daily.      [provider]  cycloSPORINE (RESTASIS) 0.05 % ophthalmic emulsion Place 1 drop into both eyes 2 (two) times daily.      [provider]  doxepin (SINEQUAN) 75 MG capsule Take 75 mg by mouth at bedtime.      [provider]  esomeprazole (NEXIUM) 40 MG capsule Take 1 capsule (40 mg total) by mouth 2 (two) times daily before a meal. 05/12/14   Mahala Menghini, PA-C  etanercept (ENBREL) 50 MG/ML injection Inject 50 mg into the skin every Thursday.    [provider]  famotidine (PEPCID) 20 MG tablet Take 1 tablet (20 mg total) by mouth 2 (two) times daily. Patient taking differently: Take 20 mg by mouth 2 (two) times daily as needed for heartburn.  01/09/18   Rolland Porter, MD  folic acid (FOLVITE) 1 MG tablet Take 1 mg by mouth daily.     [provider]  glipiZIDE (GLUCOTROL) 10 MG tablet Take 10 mg by mouth 2 (two) times daily before a meal.    [provider]  hydrocortisone (ANUSOL-HC) 2.5 % rectal cream Place 1 application rectally 2 (two) times daily. Patient taking differently: Place 1 application rectally 2 (two) times daily as  needed for hemorrhoids or anal itching.  11/01/14   Mahala Menghini, PA-C  hyoscyamine (LEVSIN SL) 0.125 MG SL tablet Place 1 tablet (0.125 mg total) under the tongue every 6 (six) hours as needed for cramping. 11/19/17   Carlis Stable, NP  ibuprofen (ADVIL,MOTRIN) 200 MG tablet Take 400 mg by mouth every 6 (six) hours as needed for headache or moderate pain.    [provider]  insulin detemir (LEVEMIR) 100 UNIT/ML injection Inject 90 Units into the skin daily.     [provider]  lubiprostone (AMITIZA) 8 MCG capsule TAKE 1 CAPSULE BY MOUTH ONCE DAILY WITH  BREAKFAST Patient taking differently: Take 8 mcg by mouth daily with breakfast.  09/17/18   Carlis Stable, NP  methotrexate (RHEUMATREX) 2.5 MG tablet Take 7.5 mg by mouth See admin instructions. Take 3 tablets (7.5 mg) by mouth twice weekly on Thursday and Fridays.  Caution:Chemotherapy. Protect from light.    [provider]  metoprolol (TOPROL-XL) 100 MG 24 hr tablet Take 100 mg by mouth every morning.     [provider]  nitroGLYCERIN (NITROGLYN) 2 % ointment Apply 1 inch topically every 8 (eight) hours as needed (hemorrhoids).    [provider]    Family History Family History  Problem Relation Age of Onset  . Colon cancer Father 56  . Hypertension Sister   . Hypertension Brother   . Kidney disease Brother     Social History Social History   Tobacco Use  . Smoking status: Former Smoker    Packs/day: 0.25    Years: 19.00    Pack years: 4.75    Types: Cigarettes    Quit date: 01/09/2007    Years since quitting: 11.9  . Smokeless tobacco: Never Used  Substance Use Topics  . Alcohol use: No  . Drug use: No     Allergies   Aspirin, Propoxyphene n-acetaminophen, Zithromax [  azithromycin], and Erythromycin   Review of Systems Review of Systems  Musculoskeletal: Negative for back pain.     Physical Exam Updated Vital Signs BP (!) 154/105 (BP Location: Right Arm)   Pulse 89    Temp 98.1 F (36.7 C) (Oral)   Resp 18   Ht 5\' 2"  (1.575 m)   Wt 88.5 kg   SpO2 95%   BMI 35.67 kg/m   Physical Exam Vitals signs and nursing note reviewed.  Constitutional:      Appearance: She is well-developed.  HENT:     Head: Normocephalic.  Neck:     Musculoskeletal: Normal range of motion.  Cardiovascular:     Rate and Rhythm: Normal rate and regular rhythm.  Pulmonary:     Effort: Pulmonary effort is normal.  Abdominal:     General: Abdomen is flat. There is no distension.  Musculoskeletal: Normal range of motion.     Comments: Tender left sciatic notch  Skin:    General: Skin is warm.  Neurological:     Mental Status: She is alert and oriented to person, place, and time.      ED Treatments / Results  Labs (all labs ordered are listed, but only abnormal results are displayed) Labs Reviewed - No data to display  EKG None  Radiology No results found.  Procedures Procedures (including critical care time)  Medications Ordered in ED Medications - No data to display   Initial Impression / Assessment and Plan / ED Course  I have reviewed the triage vital signs and the nursing notes.  Pertinent labs & imaging results that were available during my care of the patient were reviewed by me and considered in my medical decision making (see chart for details).       MDM  Ls spine shows degenerative changes.  I suspect sciatica.  Pt counseled on sciatica.  Pt given Rx for hydrocodone and robaxin.  Pt advised to see her MD for recheck next week     Final Clinical Impressions(s) / ED Diagnoses   Final diagnoses:  Left leg pain  Sciatica of left side    ED Discharge Orders         Ordered    HYDROcodone-acetaminophen (NORCO/VICODIN) 5-325 MG tablet  Every 4 hours PRN     12/13/18 1722    methocarbamol (ROBAXIN) 500 MG tablet  4 times daily     12/13/18 1737    HYDROcodone-acetaminophen (NORCO) 5-325 MG tablet  Every 4 hours PRN     12/13/18 1739         An After Visit Summary was printed and given to the patient.    Sidney Ace 12/13/18 1742    Fredia Sorrow, MD 12/15/18 1809    Fransico Meadow, PA-C 12/23/18 1245    Fredia Sorrow, MD 12/30/18 [phone]

## 2018-12-13 NOTE — ED Notes (Signed)
Pt reports picking up child yesterday  "I must have turned wrong"   Immediate groin pain to L groin area  Took ibuprofen which resolved, then this morning pt awakened with groin and L upper leg pain  Points from Lower back around to lateral L thigh as pain site   Took more ibuprofen and muscle relaxer without relief - here for eval

## 2018-12-13 NOTE — ED Triage Notes (Signed)
Patient with left leg pain that begins in the back down to the left knee for one day.  Patient denies injury.

## 2018-12-13 NOTE — ED Notes (Signed)
KS in to assess

## 2018-12-15 MED FILL — Hydrocodone-Acetaminophen Tab 5-325 MG: ORAL | Qty: 6 | Status: AC

## 2018-12-23 ENCOUNTER — Ambulatory Visit (INDEPENDENT_AMBULATORY_CARE_PROVIDER_SITE_OTHER): Payer: Medicare Other | Admitting: Gastroenterology

## 2018-12-23 ENCOUNTER — Other Ambulatory Visit: Payer: Self-pay

## 2018-12-23 ENCOUNTER — Encounter: Payer: Self-pay | Admitting: Gastroenterology

## 2018-12-23 VITALS — BP 110/70 | HR 73 | Temp 98.5°F | Ht 62.5 in | Wt 194.0 lb

## 2018-12-23 DIAGNOSIS — K589 Irritable bowel syndrome without diarrhea: Secondary | ICD-10-CM | POA: Diagnosis not present

## 2018-12-23 DIAGNOSIS — K641 Second degree hemorrhoids: Secondary | ICD-10-CM

## 2018-12-23 DIAGNOSIS — K219 Gastro-esophageal reflux disease without esophagitis: Secondary | ICD-10-CM

## 2018-12-23 MED ORDER — HYDROCORTISONE (PERIANAL) 2.5 % EX CREA: 1.0000 "application " | TOPICAL_CREAM | Freq: Two times a day (BID) | CUTANEOUS | 1 refills | Status: DC

## 2018-12-23 MED ORDER — HYOSCYAMINE SULFATE 0.125 MG SL SUBL: 0.1250 mg | SUBLINGUAL_TABLET | Freq: Four times a day (QID) | SUBLINGUAL | 1 refills | Status: DC | PRN

## 2018-12-23 NOTE — Progress Notes (Signed)
Primary Care Physician: Glenda Chroman, MD  Primary Gastroenterologist:  Garfield Cornea, MD   Chief Complaint  Patient presents with  . Gastroesophageal Reflux    bleeding and burning hemorrhoids,fu from procedure    HPI: Brittney Martinez is a 63 y.o. female here for follow-up.  She was seen back in June for GERD and IBS.  Due for colonoscopy at that time because of family history of colon cancer, father.  Because of persistent dysphagia that she was scheduled for EGD as well.  BPN March showed mild esophageal dysmotility and spontaneous GERD, area of smooth narrowing/underdistention at the GE junction may represent sequelae of prior reflux esophagitis.  EGD and colonoscopy in September 2020.  Esophagus was normal status post dilation.  She had nonbleeding erosive gastropathy with benign biopsy.  On colonoscopy she had nonbleeding internal hemorrhoids, grade 1, medium in size.  Otherwise normal exam.  Next colonoscopy planned for 5 years due to family history of colon cancer.  Patient's primary complaints today are regarding hemorrhoids.  She has been using baby rash cream that seems to help more than over-the-counter hemorrhoid cream.  She believes her hemorrhoid has prolapsed out from time to time now.  Denies any bleeding.  No significant pain.  We discussed possibility of hemorrhoid banding but at this time she has other issues she is dealing with and wants to postpone.  She would like another prescription for Levsin sublingual for her IBS.     Current Outpatient Medications  Medication Sig Dispense Refill  . albuterol (PROVENTIL HFA;VENTOLIN HFA) 108 (90 BASE) MCG/ACT inhaler 2 puffs as needed for wheezing or shortness of breath. Take 2 puffs twice a day and every six hours as needed for shortness of breath.    Marland Kitchen albuterol (PROVENTIL) (2.5 MG/3ML) 0.083% nebulizer solution Take 2.5 mg by nebulization as needed for wheezing or shortness of breath.     . allopurinol (ZYLOPRIM) 300 MG  tablet Take 300 mg by mouth daily.    Marland Kitchen amLODipine (NORVASC) 5 MG tablet Take 5 mg by mouth every morning.     Marland Kitchen aspirin 81 MG tablet Take 81 mg by mouth daily.    Marland Kitchen atorvastatin (LIPITOR) 10 MG tablet Take 10 mg by mouth at bedtime.     . budesonide-formoterol (SYMBICORT) 160-4.5 MCG/ACT inhaler Inhale 1 puff into the lungs as needed.     . calcium carbonate (OS-CAL) 600 MG TABS Take 600 mg by mouth 2 (two) times daily with a meal.    . clopidogrel (PLAVIX) 75 MG tablet Take 75 mg by mouth daily.      . cycloSPORINE (RESTASIS) 0.05 % ophthalmic emulsion Place 1 drop into both eyes 2 (two) times daily.      Marland Kitchen doxepin (SINEQUAN) 75 MG capsule Take 75 mg by mouth at bedtime.      Marland Kitchen etanercept (ENBREL) 50 MG/ML injection Inject 50 mg into the skin every Thursday.    . folic acid (FOLVITE) 1 MG tablet Take 1 mg by mouth daily.     Marland Kitchen glipiZIDE (GLUCOTROL) 10 MG tablet Take 10 mg by mouth 2 (two) times daily before a meal.    . hydrocortisone (ANUSOL-HC) 2.5 % rectal cream Place 1 application rectally 2 (two) times daily. (Patient taking differently: Place 1 application rectally as needed for hemorrhoids or anal itching. ) 30 g 0  . ibuprofen (ADVIL,MOTRIN) 200 MG tablet Take 400 mg by mouth as needed for headache or moderate pain.     Marland Kitchen  insulin detemir (LEVEMIR) 100 UNIT/ML injection Inject 90 Units into the skin daily.     Marland Kitchen lubiprostone (AMITIZA) 8 MCG capsule TAKE 1 CAPSULE BY MOUTH ONCE DAILY WITH  BREAKFAST (Patient taking differently: Take 8 mcg by mouth every other day. ) 180 capsule 3  . methotrexate (RHEUMATREX) 2.5 MG tablet Take 7.5 mg by mouth See admin instructions. Take 3 tablets (7.5 mg) by mouth twice weekly on Thursday and Fridays.  Caution:Chemotherapy. Protect from light.    . metoprolol (TOPROL-XL) 100 MG 24 hr tablet Take 100 mg by mouth every morning.     . nitroGLYCERIN (NITROGLYN) 2 % ointment Apply 1 inch topically every 8 (eight) hours as needed (hemorrhoids).    .  pantoprazole (PROTONIX) 40 MG tablet Take 40 mg by mouth 2 (two) times daily.     No current facility-administered medications for this visit.     Allergies as of 12/23/2018 - Review Complete 12/23/2018  Allergen Reaction Noted  . Aspirin Nausea Only   . Propoxyphene n-acetaminophen Nausea And Vomiting   . Zithromax [azithromycin] Nausea Only 10/01/2010  . Erythromycin Rash 02/05/2014    ROS:  General: Negative for anorexia, weight loss, fever, chills, fatigue, weakness. ENT: Negative for hoarseness, difficulty swallowing , nasal congestion. CV: Negative for chest pain, angina, palpitations, dyspnea on exertion, peripheral edema.  Respiratory: Negative for dyspnea at rest, dyspnea on exertion, cough, sputum, wheezing.  GI: See history of present illness. GU:  Negative for dysuria, hematuria, urinary incontinence, urinary frequency, nocturnal urination.  Endo: Negative for unusual weight change.    Physical Examination:   BP 110/70   Pulse 73   Temp 98.5 F (36.9 C)   Ht 5' 2.5" (1.588 m)   Wt 194 lb (88 kg)   BMI 34.92 kg/m   General: Well-nourished, well-developed in no acute distress.  Eyes: No icterus. Mouth: Oropharyngeal mucosa moist and pink , no lesions erythema or exudate. Lungs: Clear to auscultation bilaterally.  Heart: Regular rate and rhythm, no murmurs rubs or gallops.  Abdomen: Bowel sounds are normal, nontender, nondistended, no hepatosplenomegaly or masses, no abdominal bruits or hernia , no rebound or guarding.   Extremities: No lower extremity edema. No clubbing or deformities. Neuro: Alert and oriented x 4   Skin: Warm and dry, no jaundice.   Psych: Alert and cooperative, normal mood and affect.   Imaging Studies: Dg Lumbar Spine Complete  Result Date: 12/13/2018 CLINICAL DATA:  Patient with left leg pain that begins in the back down to the left knee for one day. Patient denies injury. Pt states she is having pain in her left leg that radiates down  to her lower back onset for over a day. EXAM: LUMBAR SPINE - COMPLETE 4+ VIEW COMPARISON:  None. FINDINGS: Normal alignment. Vertebral body heights are maintained without evidence of fracture. Mild intervertebral disc space loss at L5-S1. The remaining intervertebral disc spaces are well maintained. Lower lumbar facet hypertrophy. Visualized portions of the pelvis are unremarkable. The SI joints are open. Nonobstructive bowel gas pattern. Extensive calcification in the abdominal aorta. IMPRESSION: 1. No acute osseous abnormality in the lumbar spine. 2. Lower lumbar degenerative changes and facet hypertrophy. 3. Extensive aortic atherosclerosis. Electronically Signed   By: Audie Pinto M.D.   On: 12/13/2018 16:58

## 2018-12-23 NOTE — Patient Instructions (Signed)
1. Continue pantoprazole. You can try backing down to ONCE daily before breakfast. If you have recurrent abdominal pain, reflux on a regular basis, then go back to TWICE daily before breakfast and evening meal.  2. Trial of anusol cream twice a day for 14 days, apply to external rectal area and inside rectum for hemorrhoids.  3. Levsin up to four times daily for abdominal cramping. HOLD for constipation.  4. If you decide the hemorrhoid cream is not controlling your hemorrhoids symptoms, you cal call to schedule hemorrhoid banding. Make sure you mention reason for visit to be hemorrhoid banding as only Dr. Gala Romney and Roseanne Kaufman, NP do the procedure here.  5. Return to the office in one year or call sooner if needed.

## 2018-12-26 ENCOUNTER — Telehealth: Payer: Self-pay | Admitting: Gastroenterology

## 2018-12-26 ENCOUNTER — Encounter: Payer: Self-pay | Admitting: Gastroenterology

## 2018-12-26 NOTE — Telephone Encounter (Signed)
Please make recall for colonoscopy in September 2025.

## 2018-12-26 NOTE — Assessment & Plan Note (Signed)
Trial of anusol cream twice a day for 14 days, apply to external rectal area and inside rectum for hemorrhoids.  We discussed hemorrhoid banding options.  She will call when she is ready.  Otherwise we will see her back in a year.

## 2018-12-26 NOTE — Assessment & Plan Note (Signed)
Continue Amitiza 8 mcg every other day.  She has some occasional abdominal cramping.  May use Levsin up to four times daily for abdominal cramping. HOLD for constipation.  Return to the office in 1 year.

## 2018-12-26 NOTE — Assessment & Plan Note (Signed)
1. Symptoms well controlled.  Try backing down to once daily pantoprazole but if recurrent abdominal pain/reflux on a regular basis she can go back to twice daily.  Return to the office in 1 year.

## 2018-12-29 NOTE — Telephone Encounter (Signed)
Reminder in epic °

## 2019-03-18 ENCOUNTER — Other Ambulatory Visit (HOSPITAL_COMMUNITY): Payer: Self-pay | Admitting: Neurology

## 2019-03-18 DIAGNOSIS — M5416 Radiculopathy, lumbar region: Secondary | ICD-10-CM | POA: Diagnosis not present

## 2019-03-18 DIAGNOSIS — G894 Chronic pain syndrome: Secondary | ICD-10-CM | POA: Diagnosis not present

## 2019-03-18 DIAGNOSIS — M5002 Cervical disc disorder with myelopathy, mid-cervical region, unspecified level: Secondary | ICD-10-CM | POA: Diagnosis not present

## 2019-03-18 DIAGNOSIS — M542 Cervicalgia: Secondary | ICD-10-CM

## 2019-03-18 DIAGNOSIS — M05659 Rheumatoid arthritis of unspecified hip with involvement of other organs and systems: Secondary | ICD-10-CM | POA: Diagnosis not present

## 2019-03-20 DIAGNOSIS — Z299 Encounter for prophylactic measures, unspecified: Secondary | ICD-10-CM | POA: Diagnosis not present

## 2019-03-20 DIAGNOSIS — Z87891 Personal history of nicotine dependence: Secondary | ICD-10-CM | POA: Diagnosis not present

## 2019-03-20 DIAGNOSIS — M069 Rheumatoid arthritis, unspecified: Secondary | ICD-10-CM | POA: Diagnosis not present

## 2019-03-20 DIAGNOSIS — J441 Chronic obstructive pulmonary disease with (acute) exacerbation: Secondary | ICD-10-CM | POA: Diagnosis not present

## 2019-03-20 DIAGNOSIS — J449 Chronic obstructive pulmonary disease, unspecified: Secondary | ICD-10-CM | POA: Diagnosis not present

## 2019-03-23 ENCOUNTER — Ambulatory Visit (INDEPENDENT_AMBULATORY_CARE_PROVIDER_SITE_OTHER): Payer: Medicare Other | Admitting: Nurse Practitioner

## 2019-03-23 ENCOUNTER — Ambulatory Visit (HOSPITAL_COMMUNITY)
Admission: RE | Admit: 2019-03-23 | Discharge: 2019-03-23 | Disposition: A | Discharge status: Home / Self Care | Payer: Medicare Other | Source: Ambulatory Visit | Attending: Neurology | Admitting: Neurology

## 2019-03-23 ENCOUNTER — Encounter: Payer: Self-pay | Admitting: Nurse Practitioner

## 2019-03-23 ENCOUNTER — Other Ambulatory Visit: Payer: Self-pay

## 2019-03-23 ENCOUNTER — Encounter (HOSPITAL_COMMUNITY): Payer: Self-pay

## 2019-03-23 VITALS — BP 108/65 | HR 71 | Temp 97.0°F | Ht 62.5 in | Wt 192.4 lb

## 2019-03-23 DIAGNOSIS — K219 Gastro-esophageal reflux disease without esophagitis: Secondary | ICD-10-CM | POA: Diagnosis not present

## 2019-03-23 DIAGNOSIS — R1319 Other dysphagia: Secondary | ICD-10-CM

## 2019-03-23 DIAGNOSIS — K589 Irritable bowel syndrome without diarrhea: Secondary | ICD-10-CM

## 2019-03-23 DIAGNOSIS — M542 Cervicalgia: Secondary | ICD-10-CM | POA: Diagnosis not present

## 2019-03-23 DIAGNOSIS — R1310 Dysphagia, unspecified: Secondary | ICD-10-CM

## 2019-03-23 MED ORDER — DEXILANT 60 MG PO CPDR: 60.0000 mg | DELAYED_RELEASE_CAPSULE | Freq: Every day | ORAL | 3 refills | Status: DC

## 2019-03-23 NOTE — Assessment & Plan Note (Signed)
Doing well on current regimen.  Continue Amitiza and Levsin as needed.  Notify us of any problems.  Follow-up in 2 months.

## 2019-03-23 NOTE — Progress Notes (Signed)
Referring Provider: Glenda Chroman, MD Primary Care Physician:  Glenda Chroman, MD Primary GI:  Dr. Gala Romney  Chief Complaint  Patient presents with  . Dysphagia    trouble with foods/liquids x 1 week    HPI:   Brittney Martinez is a 64 y.o. female who presents for follow-up on dysphagia.  The patient was last seen in our office 12/23/2018 for GERD, hemorrhoids, IBS.  Previously underwent EGD and colonoscopy in September 2020 with normal esophagus status post dilation, nonbleeding erosive gastropathy with benign biopsy.  Colonoscopy found nonbleeding internal hemorrhoids noted to be grade 1, otherwise normal exam.  Recommended repeat colonoscopy in 5 years due to family history of colon cancer.  At her last visit she complained of hemorrhoids using a baby rash cream that seemed to help more than over-the-counter hemorrhoid cream.  She believes her hemorrhoid prolapsed out from time to time but no obvious bleeding.  No significant pain.  Discussed potential for hemorrhoid banding but elected to postpone due to other issues going on.  She would like another prescription for Levsin sublingual for her IBS.  Plan to continue pantoprazole, trial of Anusol rectal cream for 14 days, Levsin refill (hold for constipation).  Follow-up for hemorrhoid banding if desired.  Follow-up in 1 year otherwise.  Today she states she's doing ok overall. Began having recurrent solid food dysphagia about a week ago. Also some issues with liquids. Solid food dysphagia a couple times a week. She is having intermittent breakthrough GERD about 3 times a week. Avoids known triggers. She is on Protonix 40 mg bid. Denies abdominal pain. Does have nausea associated with her GERD flares. Denies vomiting. Denies hematochezia, melena, fever, chills, unintentional weight loss. Denies URI or flu-like symptoms. Denies loss of sense of taste or smell. Denies chest pain, dyspnea, dizziness, lightheadedness, syncope, near syncope. Denies any  other upper or lower GI symptoms.  Bowels doing well on Amitiza. Levsin as needed.   Past Medical History:  Diagnosis Date  . Abdominal aortic stenosis   . Anal fissure   . Anxiety   . Asthma   . Complication of anesthesia   . COPD (chronic obstructive pulmonary disease) ([place])   . Depression    grief  . Diabetes mellitus   . Diverticulitis   . GERD (gastroesophageal reflux disease)   . Glaucoma   . Gout   . Hypertension   . IBS (irritable bowel syndrome)   . PONV (postoperative nausea and vomiting)   . RA (rheumatoid arthritis) ([place])   . Sleep apnea    had one at one time but not anymore  . Stroke [facility])    "mini-stroke"- no deficits  . TIA (transient ischemic attack)    2010. No deficits    Past Surgical History:  Procedure Laterality Date  . ABDOMINAL HYSTERECTOMY  1982   complete  . BIOPSY  11/17/2018   Procedure: BIOPSY;  Surgeon: Daneil Dolin, MD;  Location: [facility];  Service: Endoscopy;;  . BREAST LUMPECTOMY     right-benign  . CATARACT EXTRACTION W/PHACO Right 05/13/2017   Procedure: CATARACT EXTRACTION PHACO AND INTRAOCULAR LENS PLACEMENT (IOC);  Surgeon: Tonny Branch, MD;  Location: [facility] ORS;  Service: Ophthalmology;  Laterality: Right;  CDE: 8.59  . CATARACT EXTRACTION W/PHACO Left 05/27/2017   Procedure: CATARACT EXTRACTION WITH  PHACOEMULSIFICATION AND INTRAOCULAR LENS PLACEMENT LEFT EYE;  Surgeon: Tonny Branch, MD;  Location: [facility];  Service: Ophthalmology;  Laterality: Left;  CDE: 5.66  .  CHOLECYSTECTOMY  2003  . COLONOSCOPY  12/14/09   Dr. Gala Romney :anal papilla and hemorrhoids,diminutive hperplastic rectal polyps/normal colon  . COLONOSCOPY N/A 10/29/2013   YBO:FBPZ papilla and internal hemorrhoids; colonic polyps-removed as described above. I suspect benign anorectal bleeding in the setting of hemorrhoids and  possibly fissure. tubular adenoma. next TCS 10/2020.  Marland Kitchen COLONOSCOPY WITH PROPOFOL N/A 11/17/2018   Dr. Gala Romney: Nonbleeding internal hemorrhoids,  hemorrhoids were moderate and grade.  Exam otherwise normal.  Colonoscopy in 5 years.  . ELBOW SURGERY     right  . ESOPHAGOGASTRODUODENOSCOPY  12/14/09   Dr. Gala Romney :schatzkis ring 4F, otherwise normal  . ESOPHAGOGASTRODUODENOSCOPY N/A 10/29/2013   RMR: Distal esophageal pseudodiverticulum/Nissen fundoplication  . ESOPHAGOGASTRODUODENOSCOPY (EGD) WITH PROPOFOL N/A 11/17/2018   Dr. Gala Romney: Normal esophagus status post dilation.  Nonbleeding erosive gastropathy with benign biopsy.  Marland Kitchen FINGER SURGERY     on right middle, pointer, and index fingers  . FISSURECTOMY     several  . INCISIONAL HERNIA REPAIR N/A 03/22/2014   Procedure: [name] HERNIORRHAPHY;  Surgeon: Jamesetta So, MD;  Location: [facility];  Service: General;  Laterality: N/A;  . INSERTION OF MESH N/A 03/22/2014   Procedure: INSERTION OF MESH;  Surgeon: Jamesetta So, MD;  Location: [facility];  Service: General;  Laterality: N/A;  [name] DILATION N/A 11/17/2018   Procedure: Keturah Shavers;  Surgeon: Daneil Dolin, MD;  Location: [facility];  Service: Endoscopy;  Laterality: N/A;  . NISSEN FUNDOPLICATION  [address] [place]  . WRIST SURGERY     left    Current Outpatient Medications  Medication Sig Dispense Refill  . albuterol (PROVENTIL HFA;VENTOLIN HFA) 108 (90 BASE) MCG/ACT inhaler 2 puffs as needed for wheezing or shortness of breath. Take 2 puffs twice a day and every six hours as needed for shortness of breath.    Marland Kitchen albuterol (PROVENTIL) (2.5 MG/3ML) 0.083% nebulizer solution Take 2.5 mg by nebulization as needed for wheezing or shortness of breath.     . allopurinol (ZYLOPRIM) 300 MG tablet Take 300 mg by mouth daily.    Marland Kitchen amLODipine (NORVASC) 5 MG tablet Take 5 mg by mouth every morning.     Marland Kitchen aspirin 81 MG tablet Take 81 mg by mouth daily.    Marland Kitchen atorvastatin (LIPITOR) 10 MG tablet Take 10 mg by mouth at bedtime.     . benzonatate (TESSALON) 200 MG capsule Take 200 mg by mouth every 8 (eight) hours as needed for  cough.    . budesonide-formoterol (SYMBICORT) 160-4.5 MCG/ACT inhaler Inhale 1 puff into the lungs as needed.     . calcium carbonate (OS-CAL) 600 MG TABS Take 600 mg by mouth 2 (two) times daily with a meal.    . ciprofloxacin (CIPRO) 500 MG tablet Take 500 mg by mouth 2 (two) times daily.    . clopidogrel (PLAVIX) 75 MG tablet Take 75 mg by mouth daily.      . cycloSPORINE (RESTASIS) 0.05 % ophthalmic emulsion Place 1 drop into both eyes 2 (two) times daily.      Marland Kitchen doxepin (SINEQUAN) 75 MG capsule Take 75 mg by mouth at bedtime.      Marland Kitchen etanercept (ENBREL) 50 MG/ML injection Inject 50 mg into the skin every Thursday.    . folic acid (FOLVITE) 1 MG tablet Take 1 mg by mouth daily.     Marland Kitchen glipiZIDE (GLUCOTROL) 10 MG tablet Take 10 mg by mouth 2 (two) times daily before a meal.    .  hydrocortisone (ANUSOL-HC) 2.5 % rectal cream Place 1 application rectally 2 (two) times daily. For 14 days 30 g 1  . hyoscyamine (LEVSIN SL) 0.125 MG SL tablet Place 1 tablet (0.125 mg total) under the tongue every 6 (six) hours as needed for cramping. 90 tablet 1  . ibuprofen (ADVIL,MOTRIN) 200 MG tablet Take 400 mg by mouth as needed for headache or moderate pain.     Marland Kitchen insulin detemir (LEVEMIR) 100 UNIT/ML injection Inject 90 Units into the skin daily.     Marland Kitchen lubiprostone (AMITIZA) 8 MCG capsule TAKE 1 CAPSULE BY MOUTH ONCE DAILY WITH  BREAKFAST (Patient taking differently: Take 8 mcg by mouth every other day. ) 180 capsule 3  . methotrexate (RHEUMATREX) 2.5 MG tablet Take 7.5 mg by mouth See admin instructions. Take 3 tablets (7.5 mg) by mouth twice weekly on Thursday and Fridays.  Caution:Chemotherapy. Protect from light.    . metoprolol (TOPROL-XL) 100 MG 24 hr tablet Take 100 mg by mouth every morning.     . nitroGLYCERIN (NITROGLYN) 2 % ointment Apply 1 inch topically every 8 (eight) hours as needed (hemorrhoids).    . pantoprazole (PROTONIX) 40 MG tablet Take 40 mg by mouth 2 (two) times daily.    . predniSONE  (DELTASONE) 10 MG tablet Take 10 mg by mouth 2 (two) times daily with a meal.     No current facility-administered medications for this visit.    Allergies as of 03/23/2019 - Review Complete 03/23/2019  Allergen Reaction Noted  . Aspirin Nausea Only   . Propoxyphene n-acetaminophen Nausea And Vomiting   . Zithromax [azithromycin] Nausea Only 10/01/2010  . Erythromycin Rash 02/05/2014    Family History  Problem Relation Age of Onset  . Colon cancer Father 74  . Hypertension Sister   . Hypertension Brother   . Kidney disease Brother     Social History   Socioeconomic History  . Marital status: Widowed    Spouse name: Not on file  . Number of children: Not on file  . Years of education: Not on file  . Highest education level: Not on file  Occupational History  . Occupation: disabled  Tobacco Use  . Smoking status: Former Smoker    Packs/day: 0.25    Years: 19.00    Pack years: 4.75    Types: Cigarettes    Quit date: 01/09/2007    Years since quitting: 12.2  . Smokeless tobacco: Never Used  Substance and Sexual Activity  . Alcohol use: No  . Drug use: No  . Sexual activity: Never  Other Topics Concern  . Not on file  Social History Narrative   1 son-healthy   1 daughter-MVA (drunk-driver)   Social Determinants of Health   Financial Resource Strain:   . Difficulty of Paying Living Expenses: Not on file  Food Insecurity:   . Worried About Charity fundraiser in the Last Year: Not on file  . Ran Out of Food in the Last Year: Not on file  Transportation Needs:   . Lack of Transportation (Medical): Not on file  . Lack of Transportation (Non-Medical): Not on file  Physical Activity:   . Days of Exercise per Week: Not on file  . Minutes of Exercise per Session: Not on file  Stress:   . Feeling of Stress : Not on file  Social Connections:   . Frequency of Communication with Friends and Family: Not on file  . Frequency of Social Gatherings with Friends and Family:  Not on file  . Attends Religious Services: Not on file  . Active Member of Clubs or Organizations: Not on file  . Attends Archivist Meetings: Not on file  . Marital Status: Not on file    Review of Systems: General: Negative for anorexia, weight loss, fever, chills, fatigue, weakness. ENT: Negative for hoarseness, difficulty swallowing. CV: Negative for chest pain, angina, palpitations, peripheral edema.  Respiratory: Negative for dyspnea at rest, cough, sputum, wheezing.  GI: See history of present illness. Endo: Negative for unusual weight change.  Heme: Negative for bruising or bleeding. Allergy: Negative for rash or hives.   Physical Exam: BP 108/65   Pulse 71   Temp (!) 97 F (36.1 C) (Oral)   Ht 5' 2.5" (1.588 m)   Wt 192 lb 6.4 oz (87.3 kg)   BMI 34.63 kg/m  General:   Alert and oriented. Pleasant and cooperative. Well-nourished and well-developed.  Eyes:  Without icterus, sclera clear and conjunctiva pink.  Ears:  Normal auditory acuity. Cardiovascular:  S1, S2 present without murmurs appreciated. Extremities without clubbing or edema. Respiratory:  Clear to auscultation bilaterally. No wheezes, rales, or rhonchi. No distress.  Gastrointestinal:  +BS, soft, non-tender and non-distended. No HSM noted. No guarding or rebound. No masses appreciated.  Rectal:  Deferred  Musculoskalatal:  Symmetrical without gross deformities. Neurologic:  Alert and oriented x4;  grossly normal neurologically. Psych:  Alert and cooperative. Normal mood and affect. Heme/Lymph/Immune: No excessive bruising noted.    03/23/2019 11:33 AM   Disclaimer: This note was dictated with voice recognition software. Similar sounding words can inadvertently be transcribed and may not be corrected upon review.

## 2019-03-23 NOTE — Assessment & Plan Note (Signed)
Some worsening/breakthrough GERD symptoms likely complicating her solid food dysphagia.  She is on Protonix 40 mg twice a day and has breakthrough symptoms at least a few times a week.  At this point I will start her on Dexilant 60 mg daily as she has already tried Protonix, Nexium, Prevacid.  We will provide her samples if they are available and requested progress report in 1 to 2 weeks.  If effective we can send in a prescription to her pharmacy.  Follow-up in 2 months.

## 2019-03-23 NOTE — Progress Notes (Signed)
Cc'ed to pcp °

## 2019-03-23 NOTE — Assessment & Plan Note (Signed)
Recurrent solid food and will dysphagia a few times a week associated with chronic/persistent GERD symptoms.  Has tried and failed Protonix, AcipHex, Nexium.  Currently on Protonix 40 mg twice daily and still with breakthrough symptoms.  I will trial her on Dexilant as per above.  In the interim because she just had an EGD in September 2020 we will evaluate her swallowing with a barium pill esophagram.  I also recommended dysphagia 3 diet and I reviewed with her verbally chewing and swallowing precautions.  I will provide printed instructions as well.  Call us for any worsening or severe symptoms.  ER precautions given for dysphasia that does not resolve.  Return for follow-up in 2 months.

## 2019-03-23 NOTE — Patient Instructions (Addendum)
Your health issues we discussed today were:   GERD (reflux/heartburn) with swallowing difficulties (dysphagia): 1. Unfortunately we do not have samples of Dexilant.  I sent a prescription to your pharmacy for 30-day supply with 3 refills in order to try Dexilant 2. Call us in 1 to 2 weeks and let us know if it is helping your GERD/reflux symptoms 3. As we discussed, you should take your time eating with small bites.  Further instructions are printed below to help prevent swallowing issues 4. I have entered the order for your swallowing test.  Somebody should be calling you to schedule the appointment 5. Call us if he has any worsening or severe symptoms. 6. As we discussed, frequent sudden onset forward or backward for at least 3 hours you can proceed to the emergency room for evaluation  Overall I recommend:  1. Continue your other current medications 2. Return for follow-up in 2 months 3. Call us if you have any questions or concerns.   ---------------------------------------------------------------  COVID-19 Vaccine Information can be found at: [email or web address].co.uk For questions related to vaccine distribution or appointments, please email [email or web address] or call [phone].   ---------------------------------------------------------------  At [facility] we value your feedback. You may receive a survey about your visit today. Please share your experience as we strive to create trusting relationships with our patients to provide genuine, compassionate, quality care.  We appreciate your understanding and patience as we review any laboratory studies, imaging, and other diagnostic tests that are ordered as we care for you. Our office policy is 5 business days for review of these results, and any emergent or urgent results are addressed in a timely manner for your best interest. If you do not hear from our office  in 1 week, please contact us.   We also encourage the use of MyChart, which contains your medical information for your review as well. If you are not enrolled in this feature, an access code is on this after visit summary for your convenience. Thank you for allowing [place] to be involved in your care.  It was great to see you today!  I hope you have a Happy New Year!!       Dysphagia Eating Plan, Bite Size Food This diet plan is for people with moderate swallowing problems who have transitioned from pureed and minced foods. Bite size foods are soft and cut into small chunks so that they can be swallowed safely. On this eating plan, you may be instructed to drink liquids that are thickened. Work with your health care provider and your diet and nutrition specialist (dietitian) to make sure that you are following the diet safely and getting all the nutrients you need. What are tips for following this plan? General guidelines for foods   You may eat foods that are tender, soft, and moist.  Always test food texture before taking a bite. Poke food with a fork or spoon to make sure it is tender.  Food should be easy to cut and shew. Avoid large pieces of food that require a lot of chewing.  Take small bites. Each bite should be smaller than your thumb nail (about 57mm by 15 mm).  If you were on pureed and minced food diet plans, you may eat any of the foods included in those diets.  Avoid foods that are very dry, hard, sticky, chewy, coarse, or crunchy.  If instructed by your health care provider, thicken liquids. Follow your health care provider's instructions for what products to  use, how to do this, and to what thickness. ? Your health care provider may recommend using a commercial thickener, rice cereal, or potato flakes. Ask your health care provider to recommend thickeners. ? Thickened liquids are usually a "pudding-like" consistency, or they may be as thick as honey or thick enough to eat  with a spoon. Cooking  To moisten foods, you may add liquids while you are blending, mashing, or grinding your foods to the right consistency. These liquids include gravies, sauces, vegetable or fruit juice, milk, half and half, or water.  Strain extra liquid from foods before eating.  Reheat foods slowly to prevent a tough crust from forming.  Prepare foods in advance. Meal planning  Eat a variety of foods to get all the nutrients you need.  Some foods may be tolerated better than others. Work with your dietitian to identify which foods are safest for you to eat.  Follow your meal plan as told by your dietitian. What foods are allowed? Grains Moist breads without nuts or seeds. Biscuits, muffins, pancakes, and waffles that are well-moistened with syrup, jelly, margarine, or butter. Cooked cereals. Moist bread stuffing. Moist rice. Well-moistened cold cereal with small chunks. Well-cooked pasta, noodles, rice, and bread dressing in small pieces and thick sauce. Soft dumplings or spaetzle in small pieces and butter or gravy. Vegetables Soft, well-cooked vegetables in small pieces. Soft-cooked, mashed potatoes. Thickened vegetable juice. Fruits Canned or cooked fruits that are soft or moist and do not have skin or seeds. Fresh, soft bananas. Thickened fruit juices. Meat and other protein foods Tender, moist meats or poultry in small pieces. Moist meatballs or meatloaf. Fish without bones. Eggs or egg substitutes in small pieces. Tofu. Tempeh and meat alternatives in small pieces. Well-cooked, tender beans, peas, baked beans, and other legumes. Dairy Thickened milk. Cream cheese. Yogurt. Cottage cheese. Sour cream. Small pieces of soft cheese. Fats and oils Butter. Oils. Margarine. Mayonnaise. Gravy. Spreads. Sweets and desserts Soft, smooth, moist desserts. Pudding. Custard. Moist cakes. Jam. Jelly. Honey. Preserves. Ask your health care provider whether you can have frozen  desserts. Seasoning and other foods All seasonings and sweeteners. All sauces with small chunks. Prepared tuna, egg, or chicken salad without raw fruits or vegetables. Moist casseroles with small, tender pieces of meat. Soups with tender meat. What foods are not allowed? Grains Coarse or dry cereals. Dry breads. Toast. Crackers. Tough, crusty breads, such as [place] bread and baguettes. Dry pancakes, waffles, and muffins. Sticky rice. Dry bread stuffing. Granola. Popcorn. Chips. Vegetables All raw vegetables. Cooked corn. Rubbery or stiff cooked vegetables. Stringy vegetables, such as celery. Tough, crisp fried potatoes. Potato skins. Fruits Hard, crunchy, stringy, high-pulp, and juicy raw fruits such as apples, pineapple, papaya, and watermelon. Small, round fruits, such as grapes. Dried fruit and fruit leather. Meat and other protein foods Large pieces of meat. Dry, tough meats, such as bacon, sausage, and hot dogs. Chicken, [place], or fish with skin and bones. Crunchy peanut butter. Nuts. Seeds. Nut and seed butters. Dairy Yogurt with nuts, seeds, or large chunks. Large chunks of cheese. Frozen desserts and milk consistency not allowed by your dietitian. Sweets and desserts Dry cakes. Chewy or dry cookies. Any desserts with nuts, seeds, dry fruits, coconut, pineapple, or anything dry, sticky, or hard. Chewy caramel. Licorice. Taffy-type candies. Ask your health care provider whether you can have frozen desserts. Seasoning and other foods Soups with tough or large chunks of meats, poultry, or vegetables. Corn or clam chowder. Smoothies with  large chunks of fruit. Summary  Bite size foods can be helpful for people with moderate swallowing problems.  On the dysphagia eating plan, you may eat foods that are soft, moist, and cut into pieces smaller than 26mm by 58mm.  You may be instructed to thicken liquids. Follow your health care provider's instructions about how to do this and to what  consistency. This information is not intended to replace advice given to you by your health care provider. Make sure you discuss any questions you have with your health care provider. Document Revised: 06/12/2018 Document Reviewed: 06/01/2016 Elsevier Patient Education  [place].

## 2019-03-25 ENCOUNTER — Other Ambulatory Visit (HOSPITAL_BASED_OUTPATIENT_CLINIC_OR_DEPARTMENT_OTHER): Payer: Self-pay

## 2019-03-25 ENCOUNTER — Encounter (HOSPITAL_BASED_OUTPATIENT_CLINIC_OR_DEPARTMENT_OTHER): Payer: Self-pay

## 2019-03-25 DIAGNOSIS — E668 Other obesity: Secondary | ICD-10-CM

## 2019-04-03 ENCOUNTER — Other Ambulatory Visit: Payer: Self-pay

## 2019-04-03 ENCOUNTER — Ambulatory Visit (HOSPITAL_COMMUNITY)
Admission: RE | Admit: 2019-04-03 | Discharge: 2019-04-03 | Disposition: A | Discharge status: Home / Self Care | Payer: Medicare Other | Source: Ambulatory Visit | Attending: Nurse Practitioner | Admitting: Nurse Practitioner

## 2019-04-03 DIAGNOSIS — K589 Irritable bowel syndrome without diarrhea: Secondary | ICD-10-CM | POA: Insufficient documentation

## 2019-04-03 DIAGNOSIS — R1310 Dysphagia, unspecified: Secondary | ICD-10-CM | POA: Diagnosis present

## 2019-04-03 DIAGNOSIS — R1319 Other dysphagia: Secondary | ICD-10-CM

## 2019-04-03 DIAGNOSIS — K219 Gastro-esophageal reflux disease without esophagitis: Secondary | ICD-10-CM | POA: Diagnosis not present

## 2019-04-10 ENCOUNTER — Other Ambulatory Visit (HOSPITAL_COMMUNITY)
Admission: RE | Admit: 2019-04-10 | Discharge: 2019-04-10 | Disposition: A | Discharge status: Home / Self Care | Payer: Medicare Other | Source: Ambulatory Visit | Attending: Neurology | Admitting: Neurology

## 2019-04-10 ENCOUNTER — Other Ambulatory Visit: Payer: Self-pay

## 2019-04-10 DIAGNOSIS — Z01812 Encounter for preprocedural laboratory examination: Secondary | ICD-10-CM | POA: Diagnosis not present

## 2019-04-10 DIAGNOSIS — Z20822 Contact with and (suspected) exposure to covid-19: Secondary | ICD-10-CM | POA: Insufficient documentation

## 2019-04-10 DIAGNOSIS — R519 Headache, unspecified: Secondary | ICD-10-CM | POA: Diagnosis not present

## 2019-04-10 DIAGNOSIS — Z299 Encounter for prophylactic measures, unspecified: Secondary | ICD-10-CM | POA: Diagnosis not present

## 2019-04-10 DIAGNOSIS — D849 Immunodeficiency, unspecified: Secondary | ICD-10-CM | POA: Diagnosis not present

## 2019-04-10 DIAGNOSIS — Z87891 Personal history of nicotine dependence: Secondary | ICD-10-CM | POA: Diagnosis not present

## 2019-04-10 DIAGNOSIS — J440 Chronic obstructive pulmonary disease with acute lower respiratory infection: Secondary | ICD-10-CM | POA: Diagnosis not present

## 2019-04-10 LAB — SARS CORONAVIRUS 2 (TAT 6-24 HRS): SARS Coronavirus 2: NEGATIVE

## 2019-04-14 ENCOUNTER — Other Ambulatory Visit: Payer: Self-pay

## 2019-04-14 ENCOUNTER — Ambulatory Visit: Payer: Medicare Other | Attending: Neurology | Admitting: Neurology

## 2019-04-14 DIAGNOSIS — G4733 Obstructive sleep apnea (adult) (pediatric): Secondary | ICD-10-CM | POA: Insufficient documentation

## 2019-04-14 DIAGNOSIS — Z7952 Long term (current) use of systemic steroids: Secondary | ICD-10-CM | POA: Diagnosis not present

## 2019-04-14 DIAGNOSIS — Z794 Long term (current) use of insulin: Secondary | ICD-10-CM | POA: Diagnosis not present

## 2019-04-14 DIAGNOSIS — E668 Other obesity: Secondary | ICD-10-CM | POA: Diagnosis present

## 2019-04-14 DIAGNOSIS — G4761 Periodic limb movement disorder: Secondary | ICD-10-CM | POA: Insufficient documentation

## 2019-04-14 DIAGNOSIS — Z7951 Long term (current) use of inhaled steroids: Secondary | ICD-10-CM | POA: Insufficient documentation

## 2019-04-14 DIAGNOSIS — Z7982 Long term (current) use of aspirin: Secondary | ICD-10-CM | POA: Insufficient documentation

## 2019-04-14 DIAGNOSIS — Z79899 Other long term (current) drug therapy: Secondary | ICD-10-CM | POA: Diagnosis not present

## 2019-04-14 DIAGNOSIS — Z6835 Body mass index (BMI) 35.0-35.9, adult: Secondary | ICD-10-CM | POA: Diagnosis not present

## 2019-04-20 NOTE — Procedures (Signed)
[place] A. Merlene Laughter, MD     [email or web address]             NOCTURNAL POLYSOMNOGRAPHY   LOCATION: [facility]  Patient Name: Brittney Martinez, Brittney Martinez Date: 04/14/2019 Gender: Female D.O.B: 1955-04-01 Age (years): 7 Referring Provider: Phillips Odor MD, ABSM Height (inches): 63 Interpreting Physician: Phillips Odor MD, ABSM Weight (lbs): 192 RPSGT: Rosebud Poles BMI: 35 MRN: [id number] Neck Size: 16.00 CLINICAL INFORMATION Sleep Study Type: NPSG     Indication for sleep study: N/A     Epworth Sleepiness Score: 12     SLEEP STUDY TECHNIQUE As per the AASM Manual for the Scoring of Sleep and Associated Events v2.3 (April 2016) with a hypopnea requiring 4% desaturations.  The channels recorded and monitored were frontal, central and occipital EEG, electrooculogram (EOG), submentalis EMG (chin), nasal and oral airflow, thoracic and abdominal wall motion, anterior tibialis EMG, snore microphone, electrocardiogram, and pulse oximetry.  MEDICATIONS Medications self-administered by patient taken the night of the study : N/A  Current Outpatient Medications:  .  albuterol (PROVENTIL HFA;VENTOLIN HFA) 108 (90 BASE) MCG/ACT inhaler, 2 puffs as needed for wheezing or shortness of breath. Take 2 puffs twice a day and every six hours as needed for shortness of breath., Disp: , Rfl:  .  albuterol (PROVENTIL) (2.5 MG/3ML) 0.083% nebulizer solution, Take 2.5 mg by nebulization as needed for wheezing or shortness of breath. , Disp: , Rfl:  .  allopurinol (ZYLOPRIM) 300 MG tablet, Take 300 mg by mouth daily., Disp: , Rfl:  .  amLODipine (NORVASC) 5 MG tablet, Take 5 mg by mouth every morning. , Disp: , Rfl:  .  aspirin 81 MG tablet, Take 81 mg by mouth daily., Disp: , Rfl:  .  atorvastatin (LIPITOR) 10 MG tablet, Take 10 mg by mouth at bedtime. , Disp: , Rfl:  .  benzonatate (TESSALON) 200 MG capsule, Take 200 mg by mouth every 8 (eight) hours as needed for cough., Disp:  , Rfl:  .  budesonide-formoterol (SYMBICORT) 160-4.5 MCG/ACT inhaler, Inhale 1 puff into the lungs as needed. , Disp: , Rfl:  .  calcium carbonate (OS-CAL) 600 MG TABS, Take 600 mg by mouth 2 (two) times daily with a meal., Disp: , Rfl:  .  ciprofloxacin (CIPRO) 500 MG tablet, Take 500 mg by mouth 2 (two) times daily., Disp: , Rfl:  .  clopidogrel (PLAVIX) 75 MG tablet, Take 75 mg by mouth daily.  , Disp: , Rfl:  .  cycloSPORINE (RESTASIS) 0.05 % ophthalmic emulsion, Place 1 drop into both eyes 2 (two) times daily.  , Disp: , Rfl:  .  dexlansoprazole (DEXILANT) 60 MG capsule, Take 1 capsule (60 mg total) by mouth daily., Disp: 30 capsule, Rfl: 3 .  doxepin (SINEQUAN) 75 MG capsule, Take 75 mg by mouth at bedtime.  , Disp: , Rfl:  .  etanercept (ENBREL) 50 MG/ML injection, Inject 50 mg into the skin every Thursday., Disp: , Rfl:  .  folic acid (FOLVITE) 1 MG tablet, Take 1 mg by mouth daily. , Disp: , Rfl:  .  glipiZIDE (GLUCOTROL) 10 MG tablet, Take 10 mg by mouth 2 (two) times daily before a meal., Disp: , Rfl:  .  hydrocortisone (ANUSOL-HC) 2.5 % rectal cream, Place 1 application rectally 2 (two) times daily. For 14 days, Disp: 30 g, Rfl: 1 .  hyoscyamine (LEVSIN SL) 0.125 MG SL tablet, Place 1 tablet (0.125 mg total) under the tongue every 6 (six) hours as needed for  cramping., Disp: 90 tablet, Rfl: 1 .  ibuprofen (ADVIL,MOTRIN) 200 MG tablet, Take 400 mg by mouth as needed for headache or moderate pain. , Disp: , Rfl:  .  insulin detemir (LEVEMIR) 100 UNIT/ML injection, Inject 90 Units into the skin daily. , Disp: , Rfl:  .  lubiprostone (AMITIZA) 8 MCG capsule, TAKE 1 CAPSULE BY MOUTH ONCE DAILY WITH  BREAKFAST (Patient taking differently: Take 8 mcg by mouth every other day. ), Disp: 180 capsule, Rfl: 3 .  methotrexate (RHEUMATREX) 2.5 MG tablet, Take 7.5 mg by mouth See admin instructions. Take 3 tablets (7.5 mg) by mouth twice weekly on Thursday and Fridays.  Caution:Chemotherapy. Protect  from light., Disp: , Rfl:  .  metoprolol (TOPROL-XL) 100 MG 24 hr tablet, Take 100 mg by mouth every morning. , Disp: , Rfl:  .  nitroGLYCERIN (NITROGLYN) 2 % ointment, Apply 1 inch topically every 8 (eight) hours as needed (hemorrhoids)., Disp: , Rfl:  .  pantoprazole (PROTONIX) 40 MG tablet, Take 40 mg by mouth 2 (two) times daily., Disp: , Rfl:  .  predniSONE (DELTASONE) 10 MG tablet, Take 10 mg by mouth 2 (two) times daily with a meal., Disp: , Rfl:      SLEEP ARCHITECTURE The study was initiated at 10:30:46 PM and ended at 4:58:24 AM.  Sleep onset time was 13.3 minutes and the sleep efficiency was 83.7%%. The total sleep time was 324.5 minutes.  Stage REM latency was 188.5 minutes.  The patient spent 9.9%% of the night in stage N1 sleep, 60.6%% in stage N2 sleep, 17.4%% in stage N3 and 12.2% in REM.  Alpha intrusion was absent.  Supine sleep was 66.39%.  RESPIRATORY PARAMETERS The overall apnea/hypopnea index (AHI) was 10.4 per hour. There were 8 total apneas, including 7 obstructive, 1 central and 0 mixed apneas. There were 48 hypopneas and 0 RERAs.  The AHI during Stage REM sleep was 53.2 per hour.  AHI while supine was 14.2 per hour.  The mean oxygen saturation was 87.6%. The minimum SpO2 during sleep was 79.0%. Persistent low oxygen in the low 90s and the upper 80s are observed that are not associated with the apneic events.  loud snoring was noted during this study.  CARDIAC DATA The 2 lead EKG demonstrated sinus rhythm. The mean heart rate was 69.4 beats per minute. Other EKG findings include: None.  LEG MOVEMENT DATA Moderate periodic limb movement disorder is noted by visual inspection.  IMPRESSIONS 1. Mild mostly REM related obstructive sleep apnea is observed during this recording. AutoPAP 8-14 is recommended.  2. Persistent low oxygen in the low 90s and the upper 80s are observed that are not associated with the apneic events. This suggest hypoventilation  syndrome.  Nocturnal oximetry is recommended after the patient has been on positive pressure for over a a month. 3.  Moderate periodic limb movement disorder is noted.  Delano Metz, MD Diplomate, American Board of Sleep Medicine.   ELECTRONICALLY SIGNED ON:  04/20/2019, 12:24 PM [place] PH: [phone]   FX: [phone] [place]

## 2019-05-13 DIAGNOSIS — M545 Low back pain: Secondary | ICD-10-CM | POA: Diagnosis not present

## 2019-05-13 DIAGNOSIS — M0560 Rheumatoid arthritis of unspecified site with involvement of other organs and systems: Secondary | ICD-10-CM | POA: Diagnosis not present

## 2019-05-13 DIAGNOSIS — M5000 Cervical disc disorder with myelopathy, unspecified cervical region: Secondary | ICD-10-CM | POA: Diagnosis not present

## 2019-05-13 DIAGNOSIS — G894 Chronic pain syndrome: Secondary | ICD-10-CM | POA: Diagnosis not present

## 2019-05-13 DIAGNOSIS — G4733 Obstructive sleep apnea (adult) (pediatric): Secondary | ICD-10-CM | POA: Diagnosis not present

## 2019-05-15 DIAGNOSIS — M109 Gout, unspecified: Secondary | ICD-10-CM | POA: Diagnosis not present

## 2019-05-15 DIAGNOSIS — M0579 Rheumatoid arthritis with rheumatoid factor of multiple sites without organ or systems involvement: Secondary | ICD-10-CM | POA: Diagnosis not present

## 2019-05-15 DIAGNOSIS — Z79899 Other long term (current) drug therapy: Secondary | ICD-10-CM | POA: Diagnosis not present

## 2019-05-15 DIAGNOSIS — Z13820 Encounter for screening for osteoporosis: Secondary | ICD-10-CM | POA: Diagnosis not present

## 2019-05-21 ENCOUNTER — Encounter: Payer: Self-pay | Admitting: Nurse Practitioner

## 2019-05-21 ENCOUNTER — Ambulatory Visit (INDEPENDENT_AMBULATORY_CARE_PROVIDER_SITE_OTHER): Payer: Medicare Other | Admitting: Nurse Practitioner

## 2019-05-21 ENCOUNTER — Other Ambulatory Visit: Payer: Self-pay

## 2019-05-21 VITALS — BP 121/75 | HR 78 | Temp 97.2°F | Ht 63.0 in | Wt 193.8 lb

## 2019-05-21 DIAGNOSIS — R1319 Other dysphagia: Secondary | ICD-10-CM

## 2019-05-21 DIAGNOSIS — K219 Gastro-esophageal reflux disease without esophagitis: Secondary | ICD-10-CM

## 2019-05-21 DIAGNOSIS — R1310 Dysphagia, unspecified: Secondary | ICD-10-CM | POA: Diagnosis not present

## 2019-05-21 NOTE — Assessment & Plan Note (Signed)
Persistent GERD despite Dexilant.  Partially failed Nissen fundoplication noted on BPE.  Recommend she follow-up with surgeon for options regarding revision.  In the meantime continue current medications.  I will add Carafate 1 g 3 times daily as needed.  Call with any problems related to liquid medication we can consider tablets for affordability.  Follow-up in our office in 6 months.

## 2019-05-21 NOTE — Patient Instructions (Signed)
Your health issues we discussed today were:   GERD (reflux/heartburn) with dysphagia (swallowing difficulties): 1. I am glad your swallowing difficulties have improved 2. Continue taking Dexilant once a day 3. I will send in Carafate 1 g to take 3 times a day as needed for "breakthrough heartburn" despite Dexilant 4. Call us if there are problems with the cost of the liquid medicine and we can send in the tablet form instead 5. Follow-up with your surgeon about the failed Nissen fundoplication (reflux surgery). Tell the surgeon "the swallowing study showed my 'Nissan Fundoplication' is partially failed, can it be revised to help with my current reflux symptoms?"  Overall I recommend:  1. Continue your other current medications 2. Return for follow-up in 6 months 3. Call us if you have any questions or concerns   ---------------------------------------------------------------  COVID-19 Vaccine Information can be found at: [email or web address] For questions related to vaccine distribution or appointments, please email [email or web address] or call [phone].   ---------------------------------------------------------------   At [facility] we value your feedback. You may receive a survey about your visit today. Please share your experience as we strive to create trusting relationships with our patients to provide genuine, compassionate, quality care.  We appreciate your understanding and patience as we review any laboratory studies, imaging, and other diagnostic tests that are ordered as we care for you. Our office policy is 5 business days for review of these results, and any emergent or urgent results are addressed in a timely manner for your best interest. If you do not hear from our office in 1 week, please contact us.   We also encourage the use of MyChart, which contains your medical information for your review  as well. If you are not enrolled in this feature, an access code is on this after visit summary for your convenience. Thank you for allowing [place] to be involved in your care.  It was great to see you today!  I hope you have a great day!!

## 2019-05-21 NOTE — Progress Notes (Signed)
Referring Provider: Glenda Chroman, MD Primary Care Physician:  Glenda Chroman, MD Primary GI:  Dr. Gala Romney  Chief Complaint  Patient presents with  . Gastroesophageal Reflux    reports reflux still coming back up    HPI:   Brittney Martinez is a 64 y.o. female who presents for follow-up on dysphagia.  The patient was last seen in our office 03/23/2019 for GERD, dysphagia, IBS.  EGD and colonoscopy up-to-date September 2020 with normal esophagus status post dilation, nonbleeding erosive gastropathy with benign biopsy.  Colonoscopy with nonbleeding internal hemorrhoids otherwise normal.  Recommended 5-year repeat exam (2026).  Previously noted hemorrhoids do better with over-the-counter rash cream rather than hemorrhoid cream.  Query possible hemorrhoid banding but she elected to postpone for that time.  At her last visit she noted recurrent solid food dysphagia about a week ago and some issues with liquids.  Solid food dysphagia couple times a week with intermittent breakthrough GERD about 3 times a week.  Avoid triggers, on Protonix 40 mg twice daily, no abdominal pain.  Nausea associated with GERD flares but no vomiting.  No other overt GI complaints.  Bowels doing well on Amitiza and Levsin as needed.  Recommended trial of Dexilant, progress report 1 to 2 weeks, chewing/swallowing precautions reviewed, BPE, follow-up in 2 months.  BPE completed 04/03/2019 which found/laryngeal penetration without aspiration, question partially failed fundoplication wrap with an indentation seen only along the single side of the GE junction without GERD or hiatal hernia.  Recommended she follow-up with the surgeon who completed her fundoplication for possible redo.  Today she states she's doing ok overall. She's still having GERD despite Dexilant. Hasn't spoken to surgeon about this. She had this done at [facility]. She has plans to speak to the surgeon. Dysphagia is improved, no breakthrough. Denies abdominal pain. She  admits regular nausea with GERD flares, but no vomiting. Denies hematochezia, melena, fever, chills, unintentional weight loss. Denies URI or flu-like symptoms. Denies loss of sense of taste or smell. Has had a COVID-19 test last about a month ago which was negative. Has not had the COVID-19 vaccine yet and wants to but isn't sure about if she can on prednisone. We searched and no obvious contraindications. I recommended she get on a waiting list and should be off prednisone in about a week. Denies chest pain, dyspnea, dizziness, lightheadedness, syncope, near syncope. Denies any other upper or lower GI symptoms.  Past Medical History:  Diagnosis Date  . Abdominal aortic stenosis   . Anal fissure   . Anxiety   . Asthma   . Complication of anesthesia   . COPD (chronic obstructive pulmonary disease) ([place])   . Depression    grief  . Diabetes mellitus   . Diverticulitis   . GERD (gastroesophageal reflux disease)   . Glaucoma   . Gout   . Hypertension   . IBS (irritable bowel syndrome)   . PONV (postoperative nausea and vomiting)   . RA (rheumatoid arthritis) ([place])   . Sleep apnea    had one at one time but not anymore  . Stroke [facility])    "mini-stroke"- no deficits  . TIA (transient ischemic attack)    2010. No deficits    Past Surgical History:  Procedure Laterality Date  . ABDOMINAL HYSTERECTOMY  1982   complete  . BIOPSY  11/17/2018   Procedure: BIOPSY;  Surgeon: Daneil Dolin, MD;  Location: [facility];  Service: Endoscopy;;  . BREAST  LUMPECTOMY     right-benign  . CATARACT EXTRACTION W/PHACO Right 05/13/2017   Procedure: CATARACT EXTRACTION PHACO AND INTRAOCULAR LENS PLACEMENT (IOC);  Surgeon: Tonny Branch, MD;  Location: [facility] ORS;  Service: Ophthalmology;  Laterality: Right;  CDE: 8.59  . CATARACT EXTRACTION W/PHACO Left 05/27/2017   Procedure: CATARACT EXTRACTION WITH  PHACOEMULSIFICATION AND INTRAOCULAR LENS PLACEMENT LEFT EYE;  Surgeon: Tonny Branch, MD;  Location: [facility];   Service: Ophthalmology;  Laterality: Left;  CDE: 5.66  . CHOLECYSTECTOMY  2003  . COLONOSCOPY  12/14/09   Dr. Gala Romney :anal papilla and hemorrhoids,diminutive hperplastic rectal polyps/normal colon  . COLONOSCOPY N/A 10/29/2013   HKV:QQVZ papilla and internal hemorrhoids; colonic polyps-removed as described above. I suspect benign anorectal bleeding in the setting of hemorrhoids and  possibly fissure. tubular adenoma. next TCS 10/2020.  Marland Kitchen COLONOSCOPY WITH PROPOFOL N/A 11/17/2018   Dr. Gala Romney: Nonbleeding internal hemorrhoids, hemorrhoids were moderate and grade.  Exam otherwise normal.  Colonoscopy in 5 years.  . ELBOW SURGERY     right  . ESOPHAGOGASTRODUODENOSCOPY  12/14/09   Dr. Gala Romney :schatzkis ring 49F, otherwise normal  . ESOPHAGOGASTRODUODENOSCOPY N/A 10/29/2013   RMR: Distal esophageal pseudodiverticulum/Nissen fundoplication  . ESOPHAGOGASTRODUODENOSCOPY (EGD) WITH PROPOFOL N/A 11/17/2018   Dr. Gala Romney: Normal esophagus status post dilation.  Nonbleeding erosive gastropathy with benign biopsy.  Marland Kitchen FINGER SURGERY     on right middle, pointer, and index fingers  . FISSURECTOMY     several  . INCISIONAL HERNIA REPAIR N/A 03/22/2014   Procedure: [name] HERNIORRHAPHY;  Surgeon: Jamesetta So, MD;  Location: [facility];  Service: General;  Laterality: N/A;  . INSERTION OF MESH N/A 03/22/2014   Procedure: INSERTION OF MESH;  Surgeon: Jamesetta So, MD;  Location: [facility];  Service: General;  Laterality: N/A;  [name] DILATION N/A 11/17/2018   Procedure: Keturah Shavers;  Surgeon: Daneil Dolin, MD;  Location: [facility];  Service: Endoscopy;  Laterality: N/A;  . NISSEN FUNDOPLICATION  [id number] [place]  . WRIST SURGERY     left    Current Outpatient Medications  Medication Sig Dispense Refill  . albuterol (PROVENTIL HFA;VENTOLIN HFA) 108 (90 BASE) MCG/ACT inhaler 2 puffs as needed for wheezing or shortness of breath. Take 2 puffs twice a day and every six hours as needed for shortness  of breath.    Marland Kitchen albuterol (PROVENTIL) (2.5 MG/3ML) 0.083% nebulizer solution Take 2.5 mg by nebulization as needed for wheezing or shortness of breath.     . allopurinol (ZYLOPRIM) 300 MG tablet Take 300 mg by mouth daily.    Marland Kitchen amLODipine (NORVASC) 5 MG tablet Take 5 mg by mouth every morning.     Marland Kitchen aspirin 81 MG tablet Take 81 mg by mouth daily.    Marland Kitchen atorvastatin (LIPITOR) 10 MG tablet Take 10 mg by mouth at bedtime.     . benzonatate (TESSALON) 200 MG capsule Take 200 mg by mouth every 8 (eight) hours as needed for cough.    . budesonide-formoterol (SYMBICORT) 160-4.5 MCG/ACT inhaler Inhale 1 puff into the lungs as needed.     . calcium carbonate (OS-CAL) 600 MG TABS Take 600 mg by mouth 2 (two) times daily with a meal.    . clopidogrel (PLAVIX) 75 MG tablet Take 75 mg by mouth daily.      . cycloSPORINE (RESTASIS) 0.05 % ophthalmic emulsion Place 1 drop into both eyes 2 (two) times daily.      Marland Kitchen dexlansoprazole (DEXILANT) 60 MG  capsule Take 1 capsule (60 mg total) by mouth daily. 30 capsule 3  . doxepin (SINEQUAN) 75 MG capsule Take 75 mg by mouth at bedtime.      Marland Kitchen etanercept (ENBREL) 50 MG/ML injection Inject 50 mg into the skin every Thursday.    . folic acid (FOLVITE) 1 MG tablet Take 1 mg by mouth daily.     Marland Kitchen glipiZIDE (GLUCOTROL) 10 MG tablet Take 10 mg by mouth 2 (two) times daily before a meal.    . hydrocortisone (ANUSOL-HC) 2.5 % rectal cream Place 1 application rectally 2 (two) times daily. For 14 days 30 g 1  . hyoscyamine (LEVSIN SL) 0.125 MG SL tablet Place 1 tablet (0.125 mg total) under the tongue every 6 (six) hours as needed for cramping. 90 tablet 1  . ibuprofen (ADVIL,MOTRIN) 200 MG tablet Take 400 mg by mouth as needed for headache or moderate pain.     Marland Kitchen insulin detemir (LEVEMIR) 100 UNIT/ML injection Inject 90 Units into the skin daily.     Marland Kitchen lubiprostone (AMITIZA) 8 MCG capsule TAKE 1 CAPSULE BY MOUTH ONCE DAILY WITH  BREAKFAST (Patient taking differently: Take 8 mcg  by mouth every other day. ) 180 capsule 3  . methotrexate (RHEUMATREX) 2.5 MG tablet Take 7.5 mg by mouth See admin instructions. Take 3 tablets (7.5 mg) by mouth twice weekly on Thursday and Fridays.  Caution:Chemotherapy. Protect from light.    . metoprolol (TOPROL-XL) 100 MG 24 hr tablet Take 100 mg by mouth every morning.     . nitroGLYCERIN (NITROGLYN) 2 % ointment Apply 1 inch topically every 8 (eight) hours as needed (hemorrhoids).    . predniSONE (DELTASONE) 10 MG tablet Take 5 mg by mouth 2 (two) times daily with a meal.      No current facility-administered medications for this visit.    Allergies as of 05/21/2019 - Review Complete 05/21/2019  Allergen Reaction Noted  . Aspirin Nausea Only   . Propoxyphene n-acetaminophen Nausea And Vomiting   . Zithromax [azithromycin] Nausea Only 10/01/2010  . Erythromycin Rash 02/05/2014    Family History  Problem Relation Age of Onset  . Colon cancer Father 71  . Hypertension Sister   . Hypertension Brother   . Kidney disease Brother     Social History   Socioeconomic History  . Marital status: Widowed    Spouse name: Not on file  . Number of children: Not on file  . Years of education: Not on file  . Highest education level: Not on file  Occupational History  . Occupation: disabled  Tobacco Use  . Smoking status: Former Smoker    Packs/day: 0.25    Years: 19.00    Pack years: 4.75    Types: Cigarettes    Quit date: 01/09/2007    Years since quitting: 12.3  . Smokeless tobacco: Never Used  Substance and Sexual Activity  . Alcohol use: No  . Drug use: No  . Sexual activity: Never  Other Topics Concern  . Not on file  Social History Narrative   1 son-healthy   1 daughter-MVA (drunk-driver)   Social Determinants of Health   Financial Resource Strain:   . Difficulty of Paying Living Expenses:   Food Insecurity:   . Worried About Charity fundraiser in the Last Year:   . Arboriculturist in the Last Year:     Transportation Needs:   . Film/video editor (Medical):   Marland Kitchen Lack of Transportation (Non-Medical):  Physical Activity:   . Days of Exercise per Week:   . Minutes of Exercise per Session:   Stress:   . Feeling of Stress :   Social Connections:   . Frequency of Communication with Friends and Family:   . Frequency of Social Gatherings with Friends and Family:   . Attends Religious Services:   . Active Member of Clubs or Organizations:   . Attends Archivist Meetings:   Marland Kitchen Marital Status:     Review of Systems: General: Negative for anorexia, weight loss, fever, chills, fatigue, weakness. ENT: Negative for hoarseness, difficulty swallowing. CV: Negative for chest pain, angina, palpitations, peripheral edema.  Respiratory: Negative for dyspnea at rest, cough, sputum, wheezing.  GI: See history of present illness. Endo: Negative for unusual weight change.  Heme: Negative for bruising or bleeding. Allergy: Negative for rash or hives.   Physical Exam: BP 121/75   Pulse 78   Temp (!) 97.2 F (36.2 C) (Oral)   Ht 5\' 3"  (1.6 m)   Wt 193 lb 12.8 oz (87.9 kg)   BMI 34.33 kg/m  General:   Alert and oriented. Pleasant and cooperative. Well-nourished and well-developed.  Eyes:  Without icterus, sclera clear and conjunctiva pink.  Ears:  Normal auditory acuity. Cardiovascular:  S1, S2 present without murmurs appreciated. Extremities without clubbing or edema. Respiratory:  Clear to auscultation bilaterally. No wheezes, rales, or rhonchi. No distress.  Gastrointestinal:  +BS, soft, non-tender and non-distended. No HSM noted. No guarding or rebound. No masses appreciated.  Rectal:  Deferred  Musculoskalatal:  Symmetrical without gross deformities. Neurologic:  Alert and oriented x4;  grossly normal neurologically. Psych:  Alert and cooperative. Normal mood and affect. Heme/Lymph/Immune: No excessive bruising noted.    05/21/2019 11:31 AM   Disclaimer: This note was  dictated with voice recognition software. Similar sounding words can inadvertently be transcribed and may not be corrected upon review.

## 2019-05-21 NOTE — Assessment & Plan Note (Signed)
Symptoms essentially resolved at this time.  Continue to monitor and notify us of any worsening symptoms.  Recommend she follow-up with her surgeon regarding apparent partially failed Nissen fundoplication.  Follow-up in 6 months in our office.

## 2019-05-25 ENCOUNTER — Other Ambulatory Visit: Payer: Self-pay

## 2019-05-25 DIAGNOSIS — K589 Irritable bowel syndrome without diarrhea: Secondary | ICD-10-CM

## 2019-05-25 DIAGNOSIS — R1310 Dysphagia, unspecified: Secondary | ICD-10-CM

## 2019-05-25 DIAGNOSIS — R1319 Other dysphagia: Secondary | ICD-10-CM

## 2019-05-25 DIAGNOSIS — K219 Gastro-esophageal reflux disease without esophagitis: Secondary | ICD-10-CM

## 2019-05-27 MED ORDER — DEXILANT 60 MG PO CPDR: 60.0000 mg | DELAYED_RELEASE_CAPSULE | Freq: Every day | ORAL | 3 refills | Status: DC

## 2019-05-29 DIAGNOSIS — E119 Type 2 diabetes mellitus without complications: Secondary | ICD-10-CM | POA: Diagnosis not present

## 2019-05-29 DIAGNOSIS — I10 Essential (primary) hypertension: Secondary | ICD-10-CM | POA: Diagnosis not present

## 2019-06-03 ENCOUNTER — Telehealth: Payer: Self-pay | Admitting: Internal Medicine

## 2019-06-03 DIAGNOSIS — Z961 Presence of intraocular lens: Secondary | ICD-10-CM | POA: Diagnosis not present

## 2019-06-03 DIAGNOSIS — H26499 Other secondary cataract, unspecified eye: Secondary | ICD-10-CM | POA: Diagnosis not present

## 2019-06-03 DIAGNOSIS — Z7984 Long term (current) use of oral hypoglycemic drugs: Secondary | ICD-10-CM | POA: Diagnosis not present

## 2019-06-03 DIAGNOSIS — H524 Presbyopia: Secondary | ICD-10-CM | POA: Diagnosis not present

## 2019-06-03 DIAGNOSIS — E119 Type 2 diabetes mellitus without complications: Secondary | ICD-10-CM | POA: Diagnosis not present

## 2019-06-03 NOTE — Telephone Encounter (Signed)
Patient called making sure her prescription was sent to [facility] in [place], it was for reflux but she could not remember the name

## 2019-06-03 NOTE — Telephone Encounter (Signed)
Left a message for pt. Dexilant 60 mg was sent to pts pharmacy. Pt was advised to call back if pt isn't able to get medication.

## 2019-06-15 ENCOUNTER — Ambulatory Visit: Payer: Medicare Other | Attending: Internal Medicine

## 2019-06-15 DIAGNOSIS — Z23 Encounter for immunization: Secondary | ICD-10-CM

## 2019-06-15 NOTE — Progress Notes (Signed)
   Covid-19 Vaccination Clinic  Name:  ASPASIA RUDE    MRN: [id number] DOB: 1955/03/19  06/15/2019  Ms. Estrada was observed post Covid-19 immunization for 30 minutes based on pre-vaccination screening without incident. She was provided with Vaccine Information Sheet and instruction to access the V-Safe system.   Ms. Duma was instructed to call 911 with any severe reactions post vaccine: Marland Kitchen Difficulty breathing  . Swelling of face and throat  . A fast heartbeat  . A bad rash all over body  . Dizziness and weakness   Immunizations Administered    Name Date Dose VIS Date Route   Pfizer COVID-19 Vaccine 06/15/2019 12:19 PM 0.3 mL 02/13/2019 Intramuscular   Manufacturer: [employer], [employer]   Lot: [id number]   [place]: [id number]

## 2019-07-06 ENCOUNTER — Ambulatory Visit: Payer: Medicare Other | Attending: Internal Medicine

## 2019-07-06 DIAGNOSIS — Z23 Encounter for immunization: Secondary | ICD-10-CM

## 2019-07-06 NOTE — Progress Notes (Signed)
   Covid-19 Vaccination Clinic  Name:  Brittney Martinez    MRN: [id number] DOB: 19-Aug-1955  07/06/2019  [name] was observed post Covid-19 immunization for 15 minutes without incident. She was provided with Vaccine Information Sheet and instruction to access the V-Safe system.   [name] was instructed to call 911 with any severe reactions post vaccine: Marland Kitchen Difficulty breathing  . Swelling of face and throat  . A fast heartbeat  . A bad rash all over body  . Dizziness and weakness   Immunizations Administered    Name Date Dose VIS Date Route   Pfizer COVID-19 Vaccine 07/06/2019  1:03 PM 0.3 mL 04/29/2018 Intramuscular   Manufacturer: [place]   Lot: [id number]   [place]: [id number]

## 2019-07-17 DIAGNOSIS — M0579 Rheumatoid arthritis with rheumatoid factor of multiple sites without organ or systems involvement: Secondary | ICD-10-CM | POA: Diagnosis not present

## 2019-07-17 DIAGNOSIS — Z13820 Encounter for screening for osteoporosis: Secondary | ICD-10-CM | POA: Diagnosis not present

## 2019-07-17 DIAGNOSIS — M109 Gout, unspecified: Secondary | ICD-10-CM | POA: Diagnosis not present

## 2019-07-17 DIAGNOSIS — Z79899 Other long term (current) drug therapy: Secondary | ICD-10-CM | POA: Diagnosis not present

## 2019-07-21 DIAGNOSIS — M545 Low back pain: Secondary | ICD-10-CM | POA: Diagnosis not present

## 2019-07-21 DIAGNOSIS — M0560 Rheumatoid arthritis of unspecified site with involvement of other organs and systems: Secondary | ICD-10-CM | POA: Diagnosis not present

## 2019-07-21 DIAGNOSIS — G4733 Obstructive sleep apnea (adult) (pediatric): Secondary | ICD-10-CM | POA: Diagnosis not present

## 2019-07-21 DIAGNOSIS — Z79891 Long term (current) use of opiate analgesic: Secondary | ICD-10-CM | POA: Diagnosis not present

## 2019-07-21 DIAGNOSIS — G894 Chronic pain syndrome: Secondary | ICD-10-CM | POA: Diagnosis not present

## 2019-08-02 DIAGNOSIS — I10 Essential (primary) hypertension: Secondary | ICD-10-CM | POA: Diagnosis not present

## 2019-08-02 DIAGNOSIS — E119 Type 2 diabetes mellitus without complications: Secondary | ICD-10-CM | POA: Diagnosis not present

## 2019-08-06 DIAGNOSIS — R748 Abnormal levels of other serum enzymes: Secondary | ICD-10-CM | POA: Diagnosis not present

## 2019-08-06 DIAGNOSIS — K760 Fatty (change of) liver, not elsewhere classified: Secondary | ICD-10-CM | POA: Diagnosis not present

## 2019-08-18 DIAGNOSIS — Z299 Encounter for prophylactic measures, unspecified: Secondary | ICD-10-CM | POA: Diagnosis not present

## 2019-08-18 DIAGNOSIS — J45909 Unspecified asthma, uncomplicated: Secondary | ICD-10-CM | POA: Diagnosis not present

## 2019-08-18 DIAGNOSIS — J449 Chronic obstructive pulmonary disease, unspecified: Secondary | ICD-10-CM | POA: Diagnosis not present

## 2019-08-18 DIAGNOSIS — D849 Immunodeficiency, unspecified: Secondary | ICD-10-CM | POA: Diagnosis not present

## 2019-08-18 DIAGNOSIS — Z87891 Personal history of nicotine dependence: Secondary | ICD-10-CM | POA: Diagnosis not present

## 2019-08-18 DIAGNOSIS — J069 Acute upper respiratory infection, unspecified: Secondary | ICD-10-CM | POA: Diagnosis not present

## 2019-08-27 ENCOUNTER — Other Ambulatory Visit: Payer: Self-pay | Admitting: Nurse Practitioner

## 2019-08-27 DIAGNOSIS — K219 Gastro-esophageal reflux disease without esophagitis: Secondary | ICD-10-CM

## 2019-08-27 DIAGNOSIS — K581 Irritable bowel syndrome with constipation: Secondary | ICD-10-CM

## 2019-08-27 DIAGNOSIS — K5909 Other constipation: Secondary | ICD-10-CM

## 2019-09-02 DIAGNOSIS — J449 Chronic obstructive pulmonary disease, unspecified: Secondary | ICD-10-CM | POA: Diagnosis not present

## 2019-09-02 DIAGNOSIS — J441 Chronic obstructive pulmonary disease with (acute) exacerbation: Secondary | ICD-10-CM | POA: Diagnosis not present

## 2019-09-02 DIAGNOSIS — I10 Essential (primary) hypertension: Secondary | ICD-10-CM | POA: Diagnosis not present

## 2019-09-02 DIAGNOSIS — Z299 Encounter for prophylactic measures, unspecified: Secondary | ICD-10-CM | POA: Diagnosis not present

## 2019-09-02 DIAGNOSIS — E119 Type 2 diabetes mellitus without complications: Secondary | ICD-10-CM | POA: Diagnosis not present

## 2019-09-02 DIAGNOSIS — J90 Pleural effusion, not elsewhere classified: Secondary | ICD-10-CM | POA: Diagnosis not present

## 2019-09-02 DIAGNOSIS — E1165 Type 2 diabetes mellitus with hyperglycemia: Secondary | ICD-10-CM | POA: Diagnosis not present

## 2019-09-02 DIAGNOSIS — R05 Cough: Secondary | ICD-10-CM | POA: Diagnosis not present

## 2019-09-02 DIAGNOSIS — R0602 Shortness of breath: Secondary | ICD-10-CM | POA: Diagnosis not present

## 2019-09-02 DIAGNOSIS — M069 Rheumatoid arthritis, unspecified: Secondary | ICD-10-CM | POA: Diagnosis not present

## 2019-09-16 DIAGNOSIS — M0560 Rheumatoid arthritis of unspecified site with involvement of other organs and systems: Secondary | ICD-10-CM | POA: Diagnosis not present

## 2019-09-16 DIAGNOSIS — M545 Low back pain: Secondary | ICD-10-CM | POA: Diagnosis not present

## 2019-09-16 DIAGNOSIS — G894 Chronic pain syndrome: Secondary | ICD-10-CM | POA: Diagnosis not present

## 2019-09-16 DIAGNOSIS — G4733 Obstructive sleep apnea (adult) (pediatric): Secondary | ICD-10-CM | POA: Diagnosis not present

## 2019-09-16 DIAGNOSIS — M5000 Cervical disc disorder with myelopathy, unspecified cervical region: Secondary | ICD-10-CM | POA: Diagnosis not present

## 2019-09-16 DIAGNOSIS — Z79891 Long term (current) use of opiate analgesic: Secondary | ICD-10-CM | POA: Diagnosis not present

## 2019-09-30 DIAGNOSIS — Z79899 Other long term (current) drug therapy: Secondary | ICD-10-CM | POA: Diagnosis not present

## 2019-09-30 DIAGNOSIS — I10 Essential (primary) hypertension: Secondary | ICD-10-CM | POA: Diagnosis not present

## 2019-09-30 DIAGNOSIS — R5383 Other fatigue: Secondary | ICD-10-CM | POA: Diagnosis not present

## 2019-09-30 DIAGNOSIS — E7800 Pure hypercholesterolemia, unspecified: Secondary | ICD-10-CM | POA: Diagnosis not present

## 2019-09-30 DIAGNOSIS — Z0000 Encounter for general adult medical examination without abnormal findings: Secondary | ICD-10-CM | POA: Diagnosis not present

## 2019-09-30 DIAGNOSIS — Z299 Encounter for prophylactic measures, unspecified: Secondary | ICD-10-CM | POA: Diagnosis not present

## 2019-09-30 DIAGNOSIS — Z7189 Other specified counseling: Secondary | ICD-10-CM | POA: Diagnosis not present

## 2019-09-30 DIAGNOSIS — E1165 Type 2 diabetes mellitus with hyperglycemia: Secondary | ICD-10-CM | POA: Diagnosis not present

## 2019-10-02 DIAGNOSIS — I10 Essential (primary) hypertension: Secondary | ICD-10-CM | POA: Diagnosis not present

## 2019-10-02 DIAGNOSIS — E119 Type 2 diabetes mellitus without complications: Secondary | ICD-10-CM | POA: Diagnosis not present

## 2019-10-05 DIAGNOSIS — Z1231 Encounter for screening mammogram for malignant neoplasm of breast: Secondary | ICD-10-CM | POA: Diagnosis not present

## 2019-10-14 DIAGNOSIS — D849 Immunodeficiency, unspecified: Secondary | ICD-10-CM | POA: Diagnosis not present

## 2019-10-14 DIAGNOSIS — Z794 Long term (current) use of insulin: Secondary | ICD-10-CM | POA: Diagnosis not present

## 2019-10-14 DIAGNOSIS — M069 Rheumatoid arthritis, unspecified: Secondary | ICD-10-CM | POA: Diagnosis not present

## 2019-10-14 DIAGNOSIS — I10 Essential (primary) hypertension: Secondary | ICD-10-CM | POA: Diagnosis not present

## 2019-10-14 DIAGNOSIS — Z299 Encounter for prophylactic measures, unspecified: Secondary | ICD-10-CM | POA: Diagnosis not present

## 2019-10-26 DIAGNOSIS — E119 Type 2 diabetes mellitus without complications: Secondary | ICD-10-CM | POA: Diagnosis not present

## 2019-10-26 DIAGNOSIS — I10 Essential (primary) hypertension: Secondary | ICD-10-CM | POA: Diagnosis not present

## 2019-10-29 DIAGNOSIS — Z79899 Other long term (current) drug therapy: Secondary | ICD-10-CM | POA: Diagnosis not present

## 2019-10-29 DIAGNOSIS — M0579 Rheumatoid arthritis with rheumatoid factor of multiple sites without organ or systems involvement: Secondary | ICD-10-CM | POA: Diagnosis not present

## 2019-10-29 DIAGNOSIS — M109 Gout, unspecified: Secondary | ICD-10-CM | POA: Diagnosis not present

## 2019-10-29 DIAGNOSIS — Z13820 Encounter for screening for osteoporosis: Secondary | ICD-10-CM | POA: Diagnosis not present

## 2019-11-17 ENCOUNTER — Other Ambulatory Visit: Payer: Self-pay

## 2019-11-17 ENCOUNTER — Encounter: Payer: Self-pay | Admitting: Nurse Practitioner

## 2019-11-17 ENCOUNTER — Ambulatory Visit (INDEPENDENT_AMBULATORY_CARE_PROVIDER_SITE_OTHER): Payer: Medicare Other | Admitting: Nurse Practitioner

## 2019-11-17 VITALS — BP 124/81 | HR 76 | Temp 97.0°F | Ht 62.0 in | Wt 187.6 lb

## 2019-11-17 DIAGNOSIS — R1310 Dysphagia, unspecified: Secondary | ICD-10-CM

## 2019-11-17 DIAGNOSIS — K589 Irritable bowel syndrome without diarrhea: Secondary | ICD-10-CM | POA: Diagnosis not present

## 2019-11-17 DIAGNOSIS — K219 Gastro-esophageal reflux disease without esophagitis: Secondary | ICD-10-CM | POA: Diagnosis not present

## 2019-11-17 DIAGNOSIS — R1319 Other dysphagia: Secondary | ICD-10-CM

## 2019-11-17 MED ORDER — SUCRALFATE 1 GM/10ML PO SUSP: 1.0000 g | Freq: Four times a day (QID) | ORAL | 1 refills | Status: DC

## 2019-11-17 MED ORDER — HYOSCYAMINE SULFATE 0.125 MG SL SUBL: 0.1250 mg | SUBLINGUAL_TABLET | Freq: Four times a day (QID) | SUBLINGUAL | 1 refills | Status: DC | PRN

## 2019-11-17 NOTE — Progress Notes (Signed)
Referring Provider: Glenda Chroman, MD Primary Care Physician:  Glenda Chroman, MD Primary GI:  Dr. Gala Romney  Chief Complaint  Patient presents with  . Gastroesophageal Reflux    f/u. still has acid come back up at times  . Dysphagia    comes/goes     HPI:   Brittney Martinez is a 64 y.o. female who presents for follow-up on GERD and dysphagia. The patient was last seen in our office 05/21/2019 for the same.  EGD and colonoscopy up-to-date September 2020 with normal esophagus status post dilation, nonbleeding erosive gastropathy with benign biopsy.  Colonoscopy with nonbleeding internal hemorrhoids otherwise normal.  Recommended 5-year repeat colonoscopy in 2026.  History of hemorrhoids that do better with over-the-counter rash cream, previously elected to postpone hemorrhoid banding.  She did have recurrent dysphagia after her EGD with dilation with noted breakthrough GERD 3 times a week on Protonix 40 mg twice daily.  BPE found laryngeal penetration without aspiration, question of partially failed fundoplication wrap with indentation seen only along the side of the GE junction without GERD or hiatal hernia and recommend she follow-up with the surgeon who completed her fundoplication for possible redo.  At her last visit still having GERD despite Dexilant, has not seen the surgeon yet (procedures performed at [facility]).  She is planning to follow-up with surgery.  Dysphagia improved without breakthrough.  Some nausea with GERD flares but no vomiting.  No other overt GI complaints.  Discussed recommendation for COVID-19 vaccination.  No other overt GI complaints.  Recommended continue Dexilant daily, Carafate 3 times a day as needed for breakthrough, follow-up with surgery about failed Nissen fundoplication, follow-up in 6 months in our office.  Today she states she is doing okay overall. Still having intermittent GERD and dysphagia symptoms. She saw the surgeon who told her that redoing the surgery  would likely cause more pain. Has breakthrough GERD about 3 times a week. Dysphagia is intermittent, cannot name a specific frequency or trigger. Nausea associated with GERD symptoms. Currently on Dexilant 60 mg daily; never got Carafate from the pharmacy. Denies other abdominal pain, vomiting, fever, chills, unintentional weight loss, hematochezia. Has intermittent dark stools. Denies URI or flu-like symptoms. Denies loss of sense of taste or smell. The patient has received COVID-19 vaccination(s). Denies chest pain, dyspnea, dizziness, lightheadedness, syncope, near syncope. Denies any other upper or lower GI symptoms.  Needs a refill of Levsin, which works well for abdominal cramping.  Past Medical History:  Diagnosis Date  . Abdominal aortic stenosis   . Anal fissure   . Anxiety   . Asthma   . Complication of anesthesia   . COPD (chronic obstructive pulmonary disease) ([place])   . Depression    grief  . Diabetes mellitus   . Diverticulitis   . GERD (gastroesophageal reflux disease)   . Glaucoma   . Gout   . Hypertension   . IBS (irritable bowel syndrome)   . PONV (postoperative nausea and vomiting)   . RA (rheumatoid arthritis) ([place])   . Sleep apnea    had one at one time but not anymore  . Stroke [facility])    "mini-stroke"- no deficits  . TIA (transient ischemic attack)    2010. No deficits    Past Surgical History:  Procedure Laterality Date  . ABDOMINAL HYSTERECTOMY  1982   complete  . BIOPSY  11/17/2018   Procedure: BIOPSY;  Surgeon: Daneil Dolin, MD;  Location: [facility];  Service:  Endoscopy;;  . BREAST LUMPECTOMY     right-benign  . CATARACT EXTRACTION W/PHACO Right 05/13/2017   Procedure: CATARACT EXTRACTION PHACO AND INTRAOCULAR LENS PLACEMENT (IOC);  Surgeon: Tonny Branch, MD;  Location: [facility] ORS;  Service: Ophthalmology;  Laterality: Right;  CDE: 8.59  . CATARACT EXTRACTION W/PHACO Left 05/27/2017   Procedure: CATARACT EXTRACTION WITH  PHACOEMULSIFICATION AND  INTRAOCULAR LENS PLACEMENT LEFT EYE;  Surgeon: Tonny Branch, MD;  Location: [facility] ORS;  Service: Ophthalmology;  Laterality: Left;  CDE: 5.66  . CHOLECYSTECTOMY  2003  . COLONOSCOPY  12/14/09   Dr. Gala Romney :anal papilla and hemorrhoids,diminutive hperplastic rectal polyps/normal colon  . COLONOSCOPY N/A 10/29/2013   YOV:ZCHY papilla and internal hemorrhoids; colonic polyps-removed as described above. I suspect benign anorectal bleeding in the setting of hemorrhoids and  possibly fissure. tubular adenoma. next TCS 10/2020.  Marland Kitchen COLONOSCOPY WITH PROPOFOL N/A 11/17/2018   Dr. Gala Romney: Nonbleeding internal hemorrhoids, hemorrhoids were moderate and grade.  Exam otherwise normal.  Colonoscopy in 5 years.  . ELBOW SURGERY     right  . ESOPHAGOGASTRODUODENOSCOPY  12/14/09   Dr. Gala Romney :schatzkis ring 78F, otherwise normal  . ESOPHAGOGASTRODUODENOSCOPY N/A 10/29/2013   RMR: Distal esophageal pseudodiverticulum/Nissen fundoplication  . ESOPHAGOGASTRODUODENOSCOPY (EGD) WITH PROPOFOL N/A 11/17/2018   Dr. Gala Romney: Normal esophagus status post dilation.  Nonbleeding erosive gastropathy with benign biopsy.  Marland Kitchen FINGER SURGERY     on right middle, pointer, and index fingers  . FISSURECTOMY     several  . INCISIONAL HERNIA REPAIR N/A 03/22/2014   Procedure: [name] HERNIORRHAPHY;  Surgeon: Jamesetta So, MD;  Location: [facility] ORS;  Service: General;  Laterality: N/A;  . INSERTION OF MESH N/A 03/22/2014   Procedure: INSERTION OF MESH;  Surgeon: Jamesetta So, MD;  Location: [facility] ORS;  Service: General;  Laterality: N/A;  [name] DILATION N/A 11/17/2018   Procedure: Keturah Shavers;  Surgeon: Daneil Dolin, MD;  Location: [facility];  Service: Endoscopy;  Laterality: N/A;  . NISSEN FUNDOPLICATION  [address] [place]  . WRIST SURGERY     left    Current Outpatient Medications  Medication Sig Dispense Refill  . Abatacept (ORENCIA) 125 MG/ML SOSY Inject into the skin once a week.    Marland Kitchen albuterol (PROVENTIL HFA;VENTOLIN  HFA) 108 (90 BASE) MCG/ACT inhaler 2 puffs as needed for wheezing or shortness of breath. Take 2 puffs twice a day and every six hours as needed for shortness of breath.    Marland Kitchen albuterol (PROVENTIL) (2.5 MG/3ML) 0.083% nebulizer solution Take 2.5 mg by nebulization as needed for wheezing or shortness of breath.     . allopurinol (ZYLOPRIM) 300 MG tablet Take 300 mg by mouth daily.    Marland Kitchen amLODipine (NORVASC) 5 MG tablet Take 5 mg by mouth every morning.     Marland Kitchen aspirin 81 MG tablet Take 81 mg by mouth daily.    Marland Kitchen atorvastatin (LIPITOR) 10 MG tablet Take 10 mg by mouth at bedtime.     . budesonide-formoterol (SYMBICORT) 160-4.5 MCG/ACT inhaler Inhale 1 puff into the lungs as needed.     . calcium carbonate (OS-CAL) 600 MG TABS Take 600 mg by mouth 2 (two) times daily with a meal.    . clopidogrel (PLAVIX) 75 MG tablet Take 75 mg by mouth daily.      . cycloSPORINE (RESTASIS) 0.05 % ophthalmic emulsion Place 1 drop into both eyes 2 (two) times daily.      Marland Kitchen dexlansoprazole (DEXILANT) 60 MG capsule Take  1 capsule (60 mg total) by mouth daily. 90 capsule 3  . doxepin (SINEQUAN) 75 MG capsule Take 75 mg by mouth at bedtime.      . folic acid (FOLVITE) 1 MG tablet Take 1 mg by mouth daily.     Marland Kitchen glipiZIDE (GLUCOTROL) 10 MG tablet Take 10 mg by mouth 2 (two) times daily before a meal.    . hyoscyamine (LEVSIN SL) 0.125 MG SL tablet Place 1 tablet (0.125 mg total) under the tongue every 6 (six) hours as needed for cramping. 90 tablet 1  . ibuprofen (ADVIL,MOTRIN) 200 MG tablet Take 400 mg by mouth as needed for headache or moderate pain.     Marland Kitchen insulin detemir (LEVEMIR) 100 UNIT/ML injection Inject 90 Units into the skin daily.     Marland Kitchen lubiprostone (AMITIZA) 8 MCG capsule TAKE 1 CAPSULE BY MOUTH  ONCE DAILY WITH BREAKFAST 90 capsule 3  . methotrexate (RHEUMATREX) 2.5 MG tablet Take 7.5 mg by mouth See admin instructions. Take 3 tablets (7.5 mg) by mouth once a week.  Caution:Chemotherapy. Protect from light.    .  metoprolol (TOPROL-XL) 100 MG 24 hr tablet Take 100 mg by mouth every morning.     . nitroGLYCERIN (NITROGLYN) 2 % ointment Apply 1 inch topically every 8 (eight) hours as needed (hemorrhoids).    . predniSONE (DELTASONE) 10 MG tablet Take 5 mg by mouth 2 (two) times daily with a meal.     . benzonatate (TESSALON) 200 MG capsule Take 200 mg by mouth every 8 (eight) hours as needed for cough. (Patient not taking: Reported on 11/17/2019)    . etanercept (ENBREL) 50 MG/ML injection Inject 50 mg into the skin every Thursday. (Patient not taking: Reported on 11/17/2019)    . hydrocortisone (ANUSOL-HC) 2.5 % rectal cream Place 1 application rectally 2 (two) times daily. For 14 days (Patient not taking: Reported on 11/17/2019) 30 g 1  . sucralfate (CARAFATE) 1 GM/10ML suspension Take 10 mLs (1 g total) by mouth 4 (four) times daily. 420 mL 1   No current facility-administered medications for this visit.    Allergies as of 11/17/2019 - Review Complete 11/17/2019  Allergen Reaction Noted  . Aspirin Nausea Only   . Propoxyphene n-acetaminophen Nausea And Vomiting   . Zithromax [azithromycin] Nausea Only 10/01/2010  . Erythromycin Rash 02/05/2014    Family History  Problem Relation Age of Onset  . Colon cancer Father 51  . Hypertension Sister   . Hypertension Brother   . Kidney disease Brother     Social History   Socioeconomic History  . Marital status: Widowed    Spouse name: Not on file  . Number of children: Not on file  . Years of education: Not on file  . Highest education level: Not on file  Occupational History  . Occupation: disabled  Tobacco Use  . Smoking status: Former Smoker    Packs/day: 0.25    Years: 19.00    Pack years: 4.75    Types: Cigarettes    Quit date: 01/09/2007    Years since quitting: 12.8  . Smokeless tobacco: Never Used  Vaping Use  . Vaping Use: Never used  Substance and Sexual Activity  . Alcohol use: No  . Drug use: No  . Sexual activity: Never    Other Topics Concern  . Not on file  Social History Narrative   1 son-healthy   1 daughter-MVA (drunk-driver)   Social Determinants of Health   Financial Resource Strain:   .  Difficulty of Paying Living Expenses: Not on file  Food Insecurity:   . Worried About Charity fundraiser in the Last Year: Not on file  . Ran Out of Food in the Last Year: Not on file  Transportation Needs:   . Lack of Transportation (Medical): Not on file  . Lack of Transportation (Non-Medical): Not on file  Physical Activity:   . Days of Exercise per Week: Not on file  . Minutes of Exercise per Session: Not on file  Stress:   . Feeling of Stress : Not on file  Social Connections:   . Frequency of Communication with Friends and Family: Not on file  . Frequency of Social Gatherings with Friends and Family: Not on file  . Attends Religious Services: Not on file  . Active Member of Clubs or Organizations: Not on file  . Attends Archivist Meetings: Not on file  . Marital Status: Not on file    Subjective: Review of Systems  Constitutional: Negative for chills, fever, malaise/fatigue and weight loss.  HENT: Negative for congestion and sore throat.   Respiratory: Negative for cough and shortness of breath.   Cardiovascular: Negative for chest pain and palpitations.  Gastrointestinal: Negative for abdominal pain, blood in stool, diarrhea, melena, nausea and vomiting.  Musculoskeletal: Negative for joint pain and myalgias.  Skin: Negative for rash.  Neurological: Negative for dizziness and weakness.  Endo/Heme/Allergies: Does not bruise/bleed easily.  Psychiatric/Behavioral: Negative for depression. The patient is not nervous/anxious.   All other systems reviewed and are negative.    Objective: BP 124/81   Pulse 76   Temp (!) 97 F (36.1 C) (Oral)   Ht 5\' 2"  (1.575 m)   Wt 187 lb 9.6 oz (85.1 kg)   BMI 34.31 kg/m  Physical Exam Vitals and nursing note reviewed.  Constitutional:       General: She is not in acute distress.    Appearance: Normal appearance. She is well-developed. She is not ill-appearing, toxic-appearing or diaphoretic.  HENT:     Head: Normocephalic and atraumatic.     Nose: No congestion or rhinorrhea.  Eyes:     General: No scleral icterus. Cardiovascular:     Rate and Rhythm: Normal rate and regular rhythm.     Heart sounds: Normal heart sounds.  Pulmonary:     Effort: Pulmonary effort is normal. No respiratory distress.     Breath sounds: Normal breath sounds.  Abdominal:     General: Bowel sounds are normal.     Palpations: Abdomen is soft. There is no hepatomegaly, splenomegaly or mass.     Tenderness: There is no abdominal tenderness. There is no guarding or rebound.     Hernia: No hernia is present.  Skin:    General: Skin is warm and dry.     Coloration: Skin is not jaundiced.     Findings: No rash.  Neurological:     General: No focal deficit present.     Mental Status: She is alert and oriented to person, place, and time.  Psychiatric:        Attention and Perception: Attention normal.        Mood and Affect: Mood normal.        Speech: Speech normal.        Behavior: Behavior normal.        Thought Content: Thought content normal.        Cognition and Memory: Cognition and memory normal.  Assessment:  Very pleasant 64 year old female with chronic GERD and dysphagia.  Significant work-up as outlined in HPI found likely partially failed Nissen fundoplication.  She did follow-up with surgeon who recommended against repeat surgery as this would likely cause worsening pain and symptoms.  At this point we can try to manage her symptoms medically.  GERD: Currently on Dexilant doing well.  Previously recommended Carafate for breakthrough symptoms, although the pharmacy stated they never received the prescription although we send it twice.  I will again send this to the pharmacy and have the patient verify that it was sent.  If  further problems can give a phone order to her pharmacy.  Currently with symptoms about 3 times a week, hopefully Carafate will help with this.  Dysphagia: We discussed better control of GERD will likely help her dysphagia symptoms.  BPE as outlined in HPI.  I discussed dysphagia prevention and will print off educational information for her  IBS: Abdominal cramping doing well with Levsin and she has requested a refill which have sent to her pharmacy.  She typically requires a dose every couple weeks.  Notify us of any further problems.   Plan: 1. Refill Levsin 2. Continue current medications 3. Added Carafate, sent to the pharmacy 4. Educational material for dysphagia prevention 5. Call for worsening symptoms 6. Follow-up in 6 months.    Thank you for allowing [place] to participate in the care of [place], DNP, AGNP-C Adult & Gerontological Nurse Practitioner [facility]   11/17/2019 3:15 PM   Disclaimer: This note was dictated with voice recognition software. Similar sounding words can inadvertently be transcribed and may not be corrected upon review.

## 2019-11-17 NOTE — Patient Instructions (Addendum)
Your health issues we discussed today were:   GERD (reflux/heartburn) with dysphagia (swallowing difficulties): 1. As we discussed, we can try to manage her symptoms medically being that the surgeon feels surgery would not likely help 2. Continue taking Dexilant daily 3. As you saw, I sent a prescription for Carafate to your pharmacy.  You can take this for breakthrough symptoms as needed 4. Let us know if the liquid medications are expensive we can send in a tablet form that you can crush and mix with 2 tablespoons of water to drink 5. I will print information on foods to avoid to help with your swallowing difficulties 6. Call us for any worsening or severe symptoms  Abdominal cramping: 1. I sent a refill of your Levsin to the pharmacy 2. Call us if you have any worsening or significant symptoms  Overall I recommend:  1. Continue your other current medications 2. Return for follow-up in 6 months 3. Call us if you have any questions or concerns   ---------------------------------------------------------------  I am glad you have gotten your COVID-19 vaccination!  Even though you are fully vaccinated you should continue to follow CDC and state/local guidelines.  ---------------------------------------------------------------   At [facility] we value your feedback. You may receive a survey about your visit today. Please share your experience as we strive to create trusting relationships with our patients to provide genuine, compassionate, quality care.  We appreciate your understanding and patience as we review any laboratory studies, imaging, and other diagnostic tests that are ordered as we care for you. Our office policy is 5 business days for review of these results, and any emergent or urgent results are addressed in a timely manner for your best interest. If you do not hear from our office in 1 week, please contact us.   We also encourage the use of MyChart, which  contains your medical information for your review as well. If you are not enrolled in this feature, an access code is on this after visit summary for your convenience. Thank you for allowing [place] to be involved in your care.  It was great to see you today!  I hope you have a great rest of your summer!!      Dysphagia Eating Plan, Bite Size Food This diet plan is for people with moderate swallowing problems who have transitioned from pureed and minced foods. Bite size foods are soft and cut into small chunks so that they can be swallowed safely. On this eating plan, you may be instructed to drink liquids that are thickened. Work with your health care provider and your diet and nutrition specialist (dietitian) to make sure that you are following the diet safely and getting all the nutrients you need. What are tips for following this plan? General guidelines for foods   You may eat foods that are tender, soft, and moist.  Always test food texture before taking a bite. Poke food with a fork or spoon to make sure it is tender.  Food should be easy to cut and shew. Avoid large pieces of food that require a lot of chewing.  Take small bites. Each bite should be smaller than your thumb nail (about 67mm by 15 mm).  If you were on pureed and minced food diet plans, you may eat any of the foods included in those diets.  Avoid foods that are very dry, hard, sticky, chewy, coarse, or crunchy.  If instructed by your health care provider, thicken liquids. Follow your health  care provider's instructions for what products to use, how to do this, and to what thickness. ? Your health care provider may recommend using a commercial thickener, rice cereal, or potato flakes. Ask your health care provider to recommend thickeners. ? Thickened liquids are usually a "pudding-like" consistency, or they may be as thick as honey or thick enough to eat with a spoon. Cooking  To moisten foods, you may add liquids  while you are blending, mashing, or grinding your foods to the right consistency. These liquids include gravies, sauces, vegetable or fruit juice, milk, half and half, or water.  Strain extra liquid from foods before eating.  Reheat foods slowly to prevent a tough crust from forming.  Prepare foods in advance. Meal planning  Eat a variety of foods to get all the nutrients you need.  Some foods may be tolerated better than others. Work with your dietitian to identify which foods are safest for you to eat.  Follow your meal plan as told by your dietitian. What foods are allowed? Grains Moist breads without nuts or seeds. Biscuits, muffins, pancakes, and waffles that are well-moistened with syrup, jelly, margarine, or butter. Cooked cereals. Moist bread stuffing. Moist rice. Well-moistened cold cereal with small chunks. Well-cooked pasta, noodles, rice, and bread dressing in small pieces and thick sauce. Soft dumplings or spaetzle in small pieces and butter or gravy. Vegetables Soft, well-cooked vegetables in small pieces. Soft-cooked, mashed potatoes. Thickened vegetable juice. Fruits Canned or cooked fruits that are soft or moist and do not have skin or seeds. Fresh, soft bananas. Thickened fruit juices. Meat and other protein foods Tender, moist meats or poultry in small pieces. Moist meatballs or meatloaf. Fish without bones. Eggs or egg substitutes in small pieces. Tofu. Tempeh and meat alternatives in small pieces. Well-cooked, tender beans, peas, baked beans, and other legumes. Dairy Thickened milk. Cream cheese. Yogurt. Cottage cheese. Sour cream. Small pieces of soft cheese. Fats and oils Butter. Oils. Margarine. Mayonnaise. Gravy. Spreads. Sweets and desserts Soft, smooth, moist desserts. Pudding. Custard. Moist cakes. Jam. Jelly. Honey. Preserves. Ask your health care provider whether you can have frozen desserts. Seasoning and other foods All seasonings and sweeteners. All  sauces with small chunks. Prepared tuna, egg, or chicken salad without raw fruits or vegetables. Moist casseroles with small, tender pieces of meat. Soups with tender meat. What foods are not allowed? Grains Coarse or dry cereals. Dry breads. Toast. Crackers. Tough, crusty breads, such as [place] bread and baguettes. Dry pancakes, waffles, and muffins. Sticky rice. Dry bread stuffing. Granola. Popcorn. Chips. Vegetables All raw vegetables. Cooked corn. Rubbery or stiff cooked vegetables. Stringy vegetables, such as celery. Tough, crisp fried potatoes. Potato skins. Fruits Hard, crunchy, stringy, high-pulp, and juicy raw fruits such as apples, pineapple, papaya, and watermelon. Small, round fruits, such as grapes. Dried fruit and fruit leather. Meat and other protein foods Large pieces of meat. Dry, tough meats, such as bacon, sausage, and hot dogs. Chicken, [place], or fish with skin and bones. Crunchy peanut butter. Nuts. Seeds. Nut and seed butters. Dairy Yogurt with nuts, seeds, or large chunks. Large chunks of cheese. Frozen desserts and milk consistency not allowed by your dietitian. Sweets and desserts Dry cakes. Chewy or dry cookies. Any desserts with nuts, seeds, dry fruits, coconut, pineapple, or anything dry, sticky, or hard. Chewy caramel. Licorice. Taffy-type candies. Ask your health care provider whether you can have frozen desserts. Seasoning and other foods Soups with tough or large chunks of meats, poultry, or  vegetables. Corn or clam chowder. Smoothies with large chunks of fruit. Summary  Bite size foods can be helpful for people with moderate swallowing problems.  On the dysphagia eating plan, you may eat foods that are soft, moist, and cut into pieces smaller than 64mm by 27mm.  You may be instructed to thicken liquids. Follow your health care provider's instructions about how to do this and to what consistency. This information is not intended to replace advice given to you  by your health care provider. Make sure you discuss any questions you have with your health care provider. Document Revised: 06/12/2018 Document Reviewed: 06/01/2016 Elsevier Patient Education  [place].

## 2019-11-18 NOTE — Progress Notes (Signed)
Cc'ed to pcp °

## 2019-11-20 DIAGNOSIS — Z79899 Other long term (current) drug therapy: Secondary | ICD-10-CM | POA: Diagnosis not present

## 2019-12-03 DIAGNOSIS — E119 Type 2 diabetes mellitus without complications: Secondary | ICD-10-CM | POA: Diagnosis not present

## 2019-12-03 DIAGNOSIS — I10 Essential (primary) hypertension: Secondary | ICD-10-CM | POA: Diagnosis not present

## 2019-12-15 DIAGNOSIS — M5459 Other low back pain: Secondary | ICD-10-CM | POA: Diagnosis not present

## 2019-12-15 DIAGNOSIS — G894 Chronic pain syndrome: Secondary | ICD-10-CM | POA: Diagnosis not present

## 2019-12-15 DIAGNOSIS — M5000 Cervical disc disorder with myelopathy, unspecified cervical region: Secondary | ICD-10-CM | POA: Diagnosis not present

## 2019-12-15 DIAGNOSIS — M0560 Rheumatoid arthritis of unspecified site with involvement of other organs and systems: Secondary | ICD-10-CM | POA: Diagnosis not present

## 2019-12-15 DIAGNOSIS — G4733 Obstructive sleep apnea (adult) (pediatric): Secondary | ICD-10-CM | POA: Diagnosis not present

## 2019-12-22 DIAGNOSIS — N390 Urinary tract infection, site not specified: Secondary | ICD-10-CM | POA: Diagnosis not present

## 2019-12-22 DIAGNOSIS — J440 Chronic obstructive pulmonary disease with acute lower respiratory infection: Secondary | ICD-10-CM | POA: Diagnosis not present

## 2019-12-22 DIAGNOSIS — Z299 Encounter for prophylactic measures, unspecified: Secondary | ICD-10-CM | POA: Diagnosis not present

## 2019-12-22 DIAGNOSIS — J209 Acute bronchitis, unspecified: Secondary | ICD-10-CM | POA: Diagnosis not present

## 2020-01-01 DIAGNOSIS — E119 Type 2 diabetes mellitus without complications: Secondary | ICD-10-CM | POA: Diagnosis not present

## 2020-01-01 DIAGNOSIS — I10 Essential (primary) hypertension: Secondary | ICD-10-CM | POA: Diagnosis not present

## 2020-01-13 DIAGNOSIS — E1165 Type 2 diabetes mellitus with hyperglycemia: Secondary | ICD-10-CM | POA: Diagnosis not present

## 2020-01-13 DIAGNOSIS — Z299 Encounter for prophylactic measures, unspecified: Secondary | ICD-10-CM | POA: Diagnosis not present

## 2020-01-13 DIAGNOSIS — I10 Essential (primary) hypertension: Secondary | ICD-10-CM | POA: Diagnosis not present

## 2020-01-13 DIAGNOSIS — Z794 Long term (current) use of insulin: Secondary | ICD-10-CM | POA: Diagnosis not present

## 2020-01-13 DIAGNOSIS — R945 Abnormal results of liver function studies: Secondary | ICD-10-CM | POA: Diagnosis not present

## 2020-01-16 ENCOUNTER — Ambulatory Visit: Payer: Medicare Other | Attending: Internal Medicine

## 2020-01-16 DIAGNOSIS — Z23 Encounter for immunization: Secondary | ICD-10-CM

## 2020-01-16 NOTE — Progress Notes (Signed)
   Covid-19 Vaccination Clinic  Name:  LAURIA DEPOY    MRN: [id number] DOB: 1955/04/08  01/16/2020  Ms. Gunnerson was observed post Covid-19 immunization for 15 minutes without incident. She was provided with Vaccine Information Sheet and instruction to access the V-Safe system.   Ms. Akridge was instructed to call 911 with any severe reactions post vaccine: Marland Kitchen Difficulty breathing  . Swelling of face and throat  . A fast heartbeat  . A bad rash all over body  . Dizziness and weakness   Immunizations Administered    Name Date Dose VIS Date Route   Pfizer COVID-19 Vaccine 01/16/2020  2:59 PM 0.3 mL 12/23/2019 Intramuscular   Manufacturer: [place]   Lot: [id number]   [place]: [id number]

## 2020-01-18 DIAGNOSIS — G4733 Obstructive sleep apnea (adult) (pediatric): Secondary | ICD-10-CM | POA: Diagnosis not present

## 2020-02-01 DIAGNOSIS — M0579 Rheumatoid arthritis with rheumatoid factor of multiple sites without organ or systems involvement: Secondary | ICD-10-CM | POA: Diagnosis not present

## 2020-02-02 DIAGNOSIS — I10 Essential (primary) hypertension: Secondary | ICD-10-CM | POA: Diagnosis not present

## 2020-02-02 DIAGNOSIS — E119 Type 2 diabetes mellitus without complications: Secondary | ICD-10-CM | POA: Diagnosis not present

## 2020-02-17 DIAGNOSIS — G4733 Obstructive sleep apnea (adult) (pediatric): Secondary | ICD-10-CM | POA: Diagnosis not present

## 2020-02-23 DIAGNOSIS — M0560 Rheumatoid arthritis of unspecified site with involvement of other organs and systems: Secondary | ICD-10-CM | POA: Diagnosis not present

## 2020-02-23 DIAGNOSIS — M5000 Cervical disc disorder with myelopathy, unspecified cervical region: Secondary | ICD-10-CM | POA: Diagnosis not present

## 2020-02-23 DIAGNOSIS — G894 Chronic pain syndrome: Secondary | ICD-10-CM | POA: Diagnosis not present

## 2020-02-23 DIAGNOSIS — G4733 Obstructive sleep apnea (adult) (pediatric): Secondary | ICD-10-CM | POA: Diagnosis not present

## 2020-02-23 DIAGNOSIS — M5459 Other low back pain: Secondary | ICD-10-CM | POA: Diagnosis not present

## 2020-03-03 DIAGNOSIS — E119 Type 2 diabetes mellitus without complications: Secondary | ICD-10-CM | POA: Diagnosis not present

## 2020-03-03 DIAGNOSIS — I10 Essential (primary) hypertension: Secondary | ICD-10-CM | POA: Diagnosis not present

## 2020-03-07 DIAGNOSIS — J440 Chronic obstructive pulmonary disease with acute lower respiratory infection: Secondary | ICD-10-CM | POA: Diagnosis not present

## 2020-03-07 DIAGNOSIS — J209 Acute bronchitis, unspecified: Secondary | ICD-10-CM | POA: Diagnosis not present

## 2020-03-07 DIAGNOSIS — Z299 Encounter for prophylactic measures, unspecified: Secondary | ICD-10-CM | POA: Diagnosis not present

## 2020-03-07 DIAGNOSIS — Z87891 Personal history of nicotine dependence: Secondary | ICD-10-CM | POA: Diagnosis not present

## 2020-03-07 DIAGNOSIS — J069 Acute upper respiratory infection, unspecified: Secondary | ICD-10-CM | POA: Diagnosis not present

## 2020-03-07 DIAGNOSIS — D849 Immunodeficiency, unspecified: Secondary | ICD-10-CM | POA: Diagnosis not present

## 2020-03-18 DIAGNOSIS — M069 Rheumatoid arthritis, unspecified: Secondary | ICD-10-CM | POA: Diagnosis not present

## 2020-03-18 DIAGNOSIS — Z299 Encounter for prophylactic measures, unspecified: Secondary | ICD-10-CM | POA: Diagnosis not present

## 2020-03-18 DIAGNOSIS — J209 Acute bronchitis, unspecified: Secondary | ICD-10-CM | POA: Diagnosis not present

## 2020-03-18 DIAGNOSIS — D849 Immunodeficiency, unspecified: Secondary | ICD-10-CM | POA: Diagnosis not present

## 2020-03-18 DIAGNOSIS — J440 Chronic obstructive pulmonary disease with acute lower respiratory infection: Secondary | ICD-10-CM | POA: Diagnosis not present

## 2020-03-19 DIAGNOSIS — G4733 Obstructive sleep apnea (adult) (pediatric): Secondary | ICD-10-CM | POA: Diagnosis not present

## 2020-04-04 DIAGNOSIS — E785 Hyperlipidemia, unspecified: Secondary | ICD-10-CM | POA: Diagnosis not present

## 2020-04-04 DIAGNOSIS — Z13820 Encounter for screening for osteoporosis: Secondary | ICD-10-CM | POA: Diagnosis not present

## 2020-04-04 DIAGNOSIS — M79643 Pain in unspecified hand: Secondary | ICD-10-CM | POA: Diagnosis not present

## 2020-04-04 DIAGNOSIS — M109 Gout, unspecified: Secondary | ICD-10-CM | POA: Diagnosis not present

## 2020-04-04 DIAGNOSIS — M0579 Rheumatoid arthritis with rheumatoid factor of multiple sites without organ or systems involvement: Secondary | ICD-10-CM | POA: Diagnosis not present

## 2020-04-04 DIAGNOSIS — Z23 Encounter for immunization: Secondary | ICD-10-CM | POA: Diagnosis not present

## 2020-04-04 DIAGNOSIS — Z79899 Other long term (current) drug therapy: Secondary | ICD-10-CM | POA: Diagnosis not present

## 2020-04-04 DIAGNOSIS — E119 Type 2 diabetes mellitus without complications: Secondary | ICD-10-CM | POA: Diagnosis not present

## 2020-04-04 DIAGNOSIS — K760 Fatty (change of) liver, not elsewhere classified: Secondary | ICD-10-CM | POA: Diagnosis not present

## 2020-04-04 DIAGNOSIS — E1169 Type 2 diabetes mellitus with other specified complication: Secondary | ICD-10-CM | POA: Diagnosis not present

## 2020-04-13 DIAGNOSIS — G4733 Obstructive sleep apnea (adult) (pediatric): Secondary | ICD-10-CM | POA: Diagnosis not present

## 2020-04-13 DIAGNOSIS — Z79891 Long term (current) use of opiate analgesic: Secondary | ICD-10-CM | POA: Diagnosis not present

## 2020-04-13 DIAGNOSIS — M5459 Other low back pain: Secondary | ICD-10-CM | POA: Diagnosis not present

## 2020-04-13 DIAGNOSIS — G4700 Insomnia, unspecified: Secondary | ICD-10-CM | POA: Diagnosis not present

## 2020-04-13 DIAGNOSIS — M5000 Cervical disc disorder with myelopathy, unspecified cervical region: Secondary | ICD-10-CM | POA: Diagnosis not present

## 2020-04-13 DIAGNOSIS — M0560 Rheumatoid arthritis of unspecified site with involvement of other organs and systems: Secondary | ICD-10-CM | POA: Diagnosis not present

## 2020-04-13 DIAGNOSIS — G894 Chronic pain syndrome: Secondary | ICD-10-CM | POA: Diagnosis not present

## 2020-04-13 DIAGNOSIS — K5903 Drug induced constipation: Secondary | ICD-10-CM | POA: Diagnosis not present

## 2020-04-19 DIAGNOSIS — E1165 Type 2 diabetes mellitus with hyperglycemia: Secondary | ICD-10-CM | POA: Diagnosis not present

## 2020-04-19 DIAGNOSIS — Z299 Encounter for prophylactic measures, unspecified: Secondary | ICD-10-CM | POA: Diagnosis not present

## 2020-04-19 DIAGNOSIS — G4733 Obstructive sleep apnea (adult) (pediatric): Secondary | ICD-10-CM | POA: Diagnosis not present

## 2020-04-19 DIAGNOSIS — J449 Chronic obstructive pulmonary disease, unspecified: Secondary | ICD-10-CM | POA: Diagnosis not present

## 2020-04-19 DIAGNOSIS — I10 Essential (primary) hypertension: Secondary | ICD-10-CM | POA: Diagnosis not present

## 2020-04-19 DIAGNOSIS — M069 Rheumatoid arthritis, unspecified: Secondary | ICD-10-CM | POA: Diagnosis not present

## 2020-05-02 DIAGNOSIS — E119 Type 2 diabetes mellitus without complications: Secondary | ICD-10-CM | POA: Diagnosis not present

## 2020-05-16 DIAGNOSIS — M79643 Pain in unspecified hand: Secondary | ICD-10-CM | POA: Diagnosis not present

## 2020-05-16 DIAGNOSIS — K760 Fatty (change of) liver, not elsewhere classified: Secondary | ICD-10-CM | POA: Diagnosis not present

## 2020-05-16 DIAGNOSIS — E785 Hyperlipidemia, unspecified: Secondary | ICD-10-CM | POA: Diagnosis not present

## 2020-05-16 DIAGNOSIS — M0579 Rheumatoid arthritis with rheumatoid factor of multiple sites without organ or systems involvement: Secondary | ICD-10-CM | POA: Diagnosis not present

## 2020-05-16 DIAGNOSIS — Z13820 Encounter for screening for osteoporosis: Secondary | ICD-10-CM | POA: Diagnosis not present

## 2020-05-16 DIAGNOSIS — Z23 Encounter for immunization: Secondary | ICD-10-CM | POA: Diagnosis not present

## 2020-05-16 DIAGNOSIS — Z79899 Other long term (current) drug therapy: Secondary | ICD-10-CM | POA: Diagnosis not present

## 2020-05-16 DIAGNOSIS — E1169 Type 2 diabetes mellitus with other specified complication: Secondary | ICD-10-CM | POA: Diagnosis not present

## 2020-05-16 DIAGNOSIS — M109 Gout, unspecified: Secondary | ICD-10-CM | POA: Diagnosis not present

## 2020-05-18 ENCOUNTER — Encounter: Payer: Self-pay | Admitting: Nurse Practitioner

## 2020-05-18 ENCOUNTER — Other Ambulatory Visit: Payer: Self-pay

## 2020-05-18 ENCOUNTER — Ambulatory Visit (INDEPENDENT_AMBULATORY_CARE_PROVIDER_SITE_OTHER): Payer: Medicare Other | Admitting: Nurse Practitioner

## 2020-05-18 VITALS — BP 110/73 | HR 97 | Temp 96.8°F | Ht 62.0 in | Wt 180.4 lb

## 2020-05-18 DIAGNOSIS — K589 Irritable bowel syndrome without diarrhea: Secondary | ICD-10-CM

## 2020-05-18 DIAGNOSIS — K219 Gastro-esophageal reflux disease without esophagitis: Secondary | ICD-10-CM

## 2020-05-18 DIAGNOSIS — K5909 Other constipation: Secondary | ICD-10-CM

## 2020-05-18 MED ORDER — HYOSCYAMINE SULFATE 0.125 MG SL SUBL: 0.1250 mg | SUBLINGUAL_TABLET | Freq: Four times a day (QID) | SUBLINGUAL | 3 refills | Status: DC | PRN

## 2020-05-18 NOTE — Progress Notes (Signed)
Referring Provider: Glenda Chroman, MD Primary Care Physician:  Glenda Chroman, MD Primary GI:  Dr. Gala Romney  Chief Complaint  Patient presents with  . Gastroesophageal Reflux    Worse at night, burning sensation  . Irritable Bowel Syndrome    Issues comes/goes with constipation    HPI:   Brittney Martinez is a 66 y.o. female who presents for follow-up on GERD and IBS-C.  Patient was last seen in our office 11/17/2019 for the same as well as dysphagia.  Noted history of GERD and constipation.  EGD and colonoscopy up-to-date September 2020 with normal esophagus status post dilation, nonbleeding erosive gastropathy with benign biopsy.  Colonoscopy with nonbleeding internal hemorrhoids otherwise normal and recommended 5-year repeat in 2026.  Hemorrhoids typically do well with topical therapy and previously elected to defer hemorrhoid banding.  Previously on Protonix with persistent symptoms.  Sometime ago (March 2021) recommended Dexilant and Carafate for breakthrough.  Noted history of failed Nissen fundoplication.  At her last visit still with intermittent GERD and dysphagia.  Saw surgeon who stated that redoing surgery would likely cause more pain.  Breakthrough GERD 3 times a week, dysphagia intermittent but cannot identify a specific trigger.  Currently on Dexilant, never got Carafate from the pharmacy.  No other overt GI complaints.  Requesting a refill of Levsin that works well for abdominal cramping.  Recommended refill Levsin, continue current meds, added Carafate which was sent to the pharmacy, dysphagia prevention educational material provided, follow-up in 6 months, call for worsening symptoms.  No further communication from the patient since her last visit.  Today she states she doing okay overall. She still has GERD most evenings. Typically goes to bed around midnight, last meal 4:00 pm. Hasn't tried elevating HOB. Has been using Carafate which helps. Uses Carafate bid, takes it in the  evening, but not before bed. Constipation is intermittent, still on Amitiza. Constipation breakthrough about 1-2 times a week. She will use Metamucil which can cuse diarrhea, hasn't tried a half dose. Denies ongoing abdominal pain, N/V, hematochezia, melena, fever, chills, unintentional weight loss. Denies URI or flu-like symptoms. Denies loss of sense of taste or smell. The patient has received COVID-19 vaccination(s). They have had a booster dose as well. Denies chest pain, dyspnea, dizziness, lightheadedness, syncope, near syncope. Denies any other upper or lower GI symptoms.  Past Medical History:  Diagnosis Date  . Abdominal aortic stenosis   . Anal fissure   . Anxiety   . Asthma   . Complication of anesthesia   . COPD (chronic obstructive pulmonary disease) ([place])   . Depression    grief  . Diabetes mellitus   . Diverticulitis   . GERD (gastroesophageal reflux disease)   . Glaucoma   . Gout   . Hypertension   . IBS (irritable bowel syndrome)   . PONV (postoperative nausea and vomiting)   . RA (rheumatoid arthritis) ([place])   . Sleep apnea    had one at one time but not anymore  . Stroke [facility])    "mini-stroke"- no deficits  . TIA (transient ischemic attack)    2010. No deficits    Past Surgical History:  Procedure Laterality Date  . ABDOMINAL HYSTERECTOMY  1982   complete  . BIOPSY  11/17/2018   Procedure: BIOPSY;  Surgeon: Daneil Dolin, MD;  Location: [facility];  Service: Endoscopy;;  . BREAST LUMPECTOMY     right-benign  . CATARACT EXTRACTION W/PHACO Right 05/13/2017  Procedure: CATARACT EXTRACTION PHACO AND INTRAOCULAR LENS PLACEMENT (IOC);  Surgeon: Tonny Branch, MD;  Location: [facility] ORS;  Service: Ophthalmology;  Laterality: Right;  CDE: 8.59  . CATARACT EXTRACTION W/PHACO Left 05/27/2017   Procedure: CATARACT EXTRACTION WITH  PHACOEMULSIFICATION AND INTRAOCULAR LENS PLACEMENT LEFT EYE;  Surgeon: Tonny Branch, MD;  Location: [facility] OR[facility];  Service: Ophthalmology;  Laterality:  Left;  CDE: 5.66  . CHOLECYSTECTOMY  2003  . COLONOSCOPY  12/14/09   Dr. Gala Romney :anal papilla and hemorrhoids,diminutive hperplastic rectal polyps/normal colon  . COLONOSCOPY N/A 10/29/2013   UEA:VWUJ papilla and internal hemorrhoids; colonic polyps-removed as described above. I suspect benign anorectal bleeding in the setting of hemorrhoids and  possibly fissure. tubular adenoma. next TCS 10/2020.  Marland Kitchen COLONOSCOPY WITH PROPOFOL N/A 11/17/2018   Dr. Gala Romney: Nonbleeding internal hemorrhoids, hemorrhoids were moderate and grade.  Exam otherwise normal.  Colonoscopy in 5 years.  . ELBOW SURGERY     right  . ESOPHAGOGASTRODUODENOSCOPY  12/14/09   Dr. Gala Romney :schatzkis ring 77F, otherwise normal  . ESOPHAGOGASTRODUODENOSCOPY N/A 10/29/2013   RMR: Distal esophageal pseudodiverticulum/Nissen fundoplication  . ESOPHAGOGASTRODUODENOSCOPY (EGD) WITH PROPOFOL N/A 11/17/2018   Dr. Gala Romney: Normal esophagus status post dilation.  Nonbleeding erosive gastropathy with benign biopsy.  Marland Kitchen FINGER SURGERY     on right middle, pointer, and index fingers  . FISSURECTOMY     several  . INCISIONAL HERNIA REPAIR N/A 03/22/2014   Procedure: [name] HERNIORRHAPHY;  Surgeon: Jamesetta So, MD;  Location: [facility] OR[facility];  Service: General;  Laterality: N/A;  . INSERTION OF MESH N/A 03/22/2014   Procedure: INSERTION OF MESH;  Surgeon: Jamesetta So, MD;  Location: [facility] ORS;  Service: General;  Laterality: N/A;  [name] DILATION N/A 11/17/2018   Procedure: Keturah Shavers;  Surgeon: Daneil Dolin, MD;  Location: [facility];  Service: Endoscopy;  Laterality: N/A;  . NISSEN FUNDOPLICATION  [address]   [address] [place]  . WRIST SURGERY     left    Current Outpatient Medications  Medication Sig Dispense Refill  . Abatacept (ORENCIA) 125 MG/ML SOSY Inject into the skin once a week.    Marland Kitchen albuterol (PROVENTIL HFA;VENTOLIN HFA) 108 (90 BASE) MCG/ACT inhaler 2 puffs as needed for wheezing or shortness of breath. Take 2 puffs twice a day and  every six hours as needed for shortness of breath.    Marland Kitchen albuterol (PROVENTIL) (2.5 MG/3ML) 0.083% nebulizer solution Take 2.5 mg by nebulization as needed for wheezing or shortness of breath.     . allopurinol (ZYLOPRIM) 300 MG tablet Take 300 mg by mouth daily.    Marland Kitchen amLODipine (NORVASC) 5 MG tablet Take 5 mg by mouth every morning.     Marland Kitchen aspirin 81 MG tablet Take 81 mg by mouth daily.    Marland Kitchen atorvastatin (LIPITOR) 10 MG tablet Take 10 mg by mouth at bedtime.     . budesonide-formoterol (SYMBICORT) 160-4.5 MCG/ACT inhaler Inhale 1 puff into the lungs as needed.     . calcium carbonate (OS-CAL) 600 MG TABS Take 600 mg by mouth 2 (two) times daily with a meal.    . clopidogrel (PLAVIX) 75 MG tablet Take 75 mg by mouth daily.    . cycloSPORINE (RESTASIS) 0.05 % ophthalmic emulsion Place 1 drop into both eyes 2 (two) times daily.    Marland Kitchen dexlansoprazole (DEXILANT) 60 MG capsule Take 1 capsule (60 mg total) by mouth daily. 90 capsule 3  . doxepin (SINEQUAN) 75 MG capsule Take 75 mg by mouth  at bedtime.    Marland Kitchen etanercept (ENBREL) 50 MG/ML injection Inject 50 mg into the skin every Thursday.    . folic acid (FOLVITE) 1 MG tablet Take 1 mg by mouth daily.     Marland Kitchen glipiZIDE (GLUCOTROL) 10 MG tablet Take 10 mg by mouth 2 (two) times daily before a meal.    . hyoscyamine (LEVSIN SL) 0.125 MG SL tablet Place 1 tablet (0.125 mg total) under the tongue every 6 (six) hours as needed for cramping. 90 tablet 1  . ibuprofen (ADVIL,MOTRIN) 200 MG tablet Take 400 mg by mouth as needed for headache or moderate pain.     Marland Kitchen insulin detemir (LEVEMIR) 100 UNIT/ML injection Inject 86 Units into the skin daily.    Marland Kitchen lubiprostone (AMITIZA) 8 MCG capsule TAKE 1 CAPSULE BY MOUTH  ONCE DAILY WITH BREAKFAST 90 capsule 3  . metoprolol (TOPROL-XL) 100 MG 24 hr tablet Take 100 mg by mouth every morning.    . nitroGLYCERIN (NITROGLYN) 2 % ointment Apply 1 inch topically every 8 (eight) hours as needed (hemorrhoids).    . sucralfate  (CARAFATE) 1 GM/10ML suspension Take 10 mLs (1 g total) by mouth 4 (four) times daily. (Patient taking differently: Take 1 g by mouth 4 (four) times daily. As needed) 420 mL 1  . benzonatate (TESSALON) 200 MG capsule Take 200 mg by mouth every 8 (eight) hours as needed for cough. (Patient not taking: No sig reported)    . hydrocortisone (ANUSOL-HC) 2.5 % rectal cream Place 1 application rectally 2 (two) times daily. For 14 days (Patient not taking: No sig reported) 30 g 1  . methotrexate (RHEUMATREX) 2.5 MG tablet Take 7.5 mg by mouth See admin instructions. Take 3 tablets (7.5 mg) by mouth once a week.  Caution:Chemotherapy. Protect from light. (Patient not taking: Reported on 05/18/2020)    . predniSONE (DELTASONE) 10 MG tablet Take 5 mg by mouth 2 (two) times daily with a meal.  (Patient not taking: Reported on 05/18/2020)     No current facility-administered medications for this visit.    Allergies as of 05/18/2020 - Review Complete 05/18/2020  Allergen Reaction Noted  . Aspirin Nausea Only   . Propoxyphene n-acetaminophen Nausea And Vomiting   . Zithromax [azithromycin] Nausea Only 10/01/2010  . Erythromycin Rash 02/05/2014    Family History  Problem Relation Age of Onset  . Colon cancer Father 72  . Hypertension Sister   . Hypertension Brother   . Kidney disease Brother     Social History   Socioeconomic History  . Marital status: Widowed    Spouse name: Not on file  . Number of children: Not on file  . Years of education: Not on file  . Highest education level: Not on file  Occupational History  . Occupation: disabled  Tobacco Use  . Smoking status: Former Smoker    Packs/day: 0.25    Years: 19.00    Pack years: 4.75    Types: Cigarettes    Quit date: 01/09/2007    Years since quitting: 13.3  . Smokeless tobacco: Never Used  Vaping Use  . Vaping Use: Never used  Substance and Sexual Activity  . Alcohol use: No  . Drug use: No  . Sexual activity: Never  Other  Topics Concern  . Not on file  Social History Narrative   1 son-healthy   1 daughter-MVA (drunk-driver)   Social Determinants of Health   Financial Resource Strain: Not on file  Food Insecurity: Not on  file  Transportation Needs: Not on file  Physical Activity: Not on file  Stress: Not on file  Social Connections: Not on file    Subjective: Review of Systems  Constitutional: Negative for chills, fever, malaise/fatigue and weight loss.  HENT: Negative for congestion and sore throat.   Respiratory: Negative for cough and shortness of breath.   Cardiovascular: Negative for chest pain and palpitations.  Gastrointestinal: Positive for constipation and heartburn. Negative for abdominal pain, blood in stool, diarrhea, melena, nausea and vomiting.  Musculoskeletal: Negative for joint pain and myalgias.  Skin: Negative for rash.  Neurological: Negative for dizziness and weakness.  Endo/Heme/Allergies: Does not bruise/bleed easily.  Psychiatric/Behavioral: Negative for depression. The patient is not nervous/anxious.   All other systems reviewed and are negative.    Objective: BP 110/73   Pulse 97   Temp (!) 96.8 F (36 C)   Ht 5\' 2"  (1.575 m)   Wt 180 lb 6.4 oz (81.8 kg)   BMI 33.00 kg/m  Physical Exam Vitals and nursing note reviewed.  Constitutional:      General: She is not in acute distress.    Appearance: Normal appearance. She is well-developed. She is obese. She is not ill-appearing, toxic-appearing or diaphoretic.  HENT:     Head: Normocephalic and atraumatic.     Nose: No congestion or rhinorrhea.  Eyes:     General: No scleral icterus. Cardiovascular:     Rate and Rhythm: Normal rate and regular rhythm.     Heart sounds: Normal heart sounds.  Pulmonary:     Effort: Pulmonary effort is normal. No respiratory distress.     Breath sounds: Normal breath sounds.  Abdominal:     General: Bowel sounds are normal.     Palpations: Abdomen is soft. There is no  hepatomegaly, splenomegaly or mass.     Tenderness: There is no abdominal tenderness. There is no guarding or rebound.     Hernia: No hernia is present.  Skin:    General: Skin is warm and dry.     Coloration: Skin is not jaundiced.     Findings: No rash.  Neurological:     General: No focal deficit present.     Mental Status: She is alert and oriented to person, place, and time.  Psychiatric:        Attention and Perception: Attention normal.        Mood and Affect: Mood normal.        Speech: Speech normal.        Behavior: Behavior normal.        Thought Content: Thought content normal.        Cognition and Memory: Cognition and memory normal.      Assessment:  Very pleasant 65 year old female with a history of GERD, IBS constipation type with associated abdominal pain.  Clinically she is doing pretty well.  Still some breakthrough GERD symptoms, typically in the evenings.  She does take Carafate 1-2 times a day.  When she does take it in the evening she takes a quarter dinnertime.  Recommended trying to take Carafate 1 hour before bed to prevent/ameliorate nighttime GERD symptoms.  Abdominal pain doing pretty well, rare occurrences which she takes Levsin.  She has IBS constipation type which is doing pretty well with typically one episode of breakthrough constipation a week for which she takes Metamucil, although a dose of Metamucil does cause diarrhea.  We discussed using half dose instead to see if this can help  manage her breakthrough symptoms without causing diarrhea.   Plan: 1. Continue current medications 2. Switch Carafate to 1 hour before bed rather than evening time 3. Try using a half a dose of Metamucil as needed for breakthrough constipation (rather than a full dose) 4. Call for any worsening problems 5. Follow-up 6 months    Thank you for allowing [place] to participate in the care of [place], DNP, AGNP-C Adult & Gerontological Nurse  Practitioner [facility]   05/18/2020 1:46 PM   Disclaimer: This note was dictated with voice recognition software. Similar sounding words can inadvertently be transcribed and may not be corrected upon review.

## 2020-05-18 NOTE — Patient Instructions (Signed)
Your health issues we discussed today were:   GERD (reflux/heartburn): 1. I am sorry you are still having symptoms in the evening 2. As we discussed, continue taking Dexilant 3. When you take your Carafate, rather than taking it in the evening/dinnertime, try taking it 1 hour before bed 4. Because of you have any worsening or severe symptoms  Abdominal pain: 1. I refilled your Levsin for you 2. Let us know if you have any worsening abdominal pain  Constipation: 1. Continue using Amitiza 2. You can use half a dose of Metamucil as needed because of full dose caused diarrhea 3. Call us for any worsening or severe symptoms  Overall I recommend:  1. Continue your other current medications 2. Return for follow-up in 6 months 3. Call for any questions or concerns   ---------------------------------------------------------------  I am glad you have gotten your COVID-19 vaccination!  Even though you are fully vaccinated you should continue to follow CDC and state/local guidelines.  ---------------------------------------------------------------   At [facility] we value your feedback. You may receive a survey about your visit today. Please share your experience as we strive to create trusting relationships with our patients to provide genuine, compassionate, quality care.  We appreciate your understanding and patience as we review any laboratory studies, imaging, and other diagnostic tests that are ordered as we care for you. Our office policy is 5 business days for review of these results, and any emergent or urgent results are addressed in a timely manner for your best interest. If you do not hear from our office in 1 week, please contact us.   We also encourage the use of MyChart, which contains your medical information for your review as well. If you are not enrolled in this feature, an access code is on this after visit summary for your convenience. Thank you for  allowing [place] to be involved in your care.  It was great to see you today!  I hope you have a great spring!!

## 2020-05-19 DIAGNOSIS — Z299 Encounter for prophylactic measures, unspecified: Secondary | ICD-10-CM | POA: Diagnosis not present

## 2020-05-19 DIAGNOSIS — J069 Acute upper respiratory infection, unspecified: Secondary | ICD-10-CM | POA: Diagnosis not present

## 2020-05-19 DIAGNOSIS — D849 Immunodeficiency, unspecified: Secondary | ICD-10-CM | POA: Diagnosis not present

## 2020-05-19 DIAGNOSIS — J209 Acute bronchitis, unspecified: Secondary | ICD-10-CM | POA: Diagnosis not present

## 2020-05-19 DIAGNOSIS — J440 Chronic obstructive pulmonary disease with acute lower respiratory infection: Secondary | ICD-10-CM | POA: Diagnosis not present

## 2020-05-26 ENCOUNTER — Telehealth: Payer: Self-pay

## 2020-05-26 NOTE — Telephone Encounter (Signed)
PA was submitted for Hyoscyamine 0.125 mg. Waiting on an approval or denial.

## 2020-06-02 DIAGNOSIS — E119 Type 2 diabetes mellitus without complications: Secondary | ICD-10-CM | POA: Diagnosis not present

## 2020-06-07 DIAGNOSIS — I10 Essential (primary) hypertension: Secondary | ICD-10-CM | POA: Diagnosis not present

## 2020-06-07 DIAGNOSIS — J209 Acute bronchitis, unspecified: Secondary | ICD-10-CM | POA: Diagnosis not present

## 2020-06-07 DIAGNOSIS — Z299 Encounter for prophylactic measures, unspecified: Secondary | ICD-10-CM | POA: Diagnosis not present

## 2020-06-07 DIAGNOSIS — E1165 Type 2 diabetes mellitus with hyperglycemia: Secondary | ICD-10-CM | POA: Diagnosis not present

## 2020-06-07 DIAGNOSIS — R5383 Other fatigue: Secondary | ICD-10-CM | POA: Diagnosis not present

## 2020-06-14 DIAGNOSIS — M109 Gout, unspecified: Secondary | ICD-10-CM | POA: Diagnosis not present

## 2020-06-14 DIAGNOSIS — M0579 Rheumatoid arthritis with rheumatoid factor of multiple sites without organ or systems involvement: Secondary | ICD-10-CM | POA: Diagnosis not present

## 2020-06-14 DIAGNOSIS — Z79899 Other long term (current) drug therapy: Secondary | ICD-10-CM | POA: Diagnosis not present

## 2020-06-14 DIAGNOSIS — Z13820 Encounter for screening for osteoporosis: Secondary | ICD-10-CM | POA: Diagnosis not present

## 2020-07-01 DIAGNOSIS — E119 Type 2 diabetes mellitus without complications: Secondary | ICD-10-CM | POA: Diagnosis not present

## 2020-07-02 DIAGNOSIS — J440 Chronic obstructive pulmonary disease with acute lower respiratory infection: Secondary | ICD-10-CM | POA: Diagnosis not present

## 2020-07-02 DIAGNOSIS — E7800 Pure hypercholesterolemia, unspecified: Secondary | ICD-10-CM | POA: Diagnosis not present

## 2020-07-02 DIAGNOSIS — I10 Essential (primary) hypertension: Secondary | ICD-10-CM | POA: Diagnosis not present

## 2020-07-02 DIAGNOSIS — E1165 Type 2 diabetes mellitus with hyperglycemia: Secondary | ICD-10-CM | POA: Diagnosis not present

## 2020-07-04 ENCOUNTER — Telehealth: Payer: Self-pay | Admitting: Internal Medicine

## 2020-07-04 NOTE — Telephone Encounter (Signed)
PATIENT SAID SOMEONE CALLED HER, PLEASE CALL BACK

## 2020-07-08 NOTE — Telephone Encounter (Signed)
Left a detailed message for pt. Hyoscyamine 0.125Mg  was called in for pt 05/2020. Medication was denied. Pt can use a GoodRx card to lower the cost. Mailed GoodRx card to pt to use.

## 2020-07-12 DIAGNOSIS — H16223 Keratoconjunctivitis sicca, not specified as Sjogren's, bilateral: Secondary | ICD-10-CM | POA: Diagnosis not present

## 2020-07-12 DIAGNOSIS — Z794 Long term (current) use of insulin: Secondary | ICD-10-CM | POA: Diagnosis not present

## 2020-07-12 DIAGNOSIS — E109 Type 1 diabetes mellitus without complications: Secondary | ICD-10-CM | POA: Diagnosis not present

## 2020-07-12 DIAGNOSIS — H26493 Other secondary cataract, bilateral: Secondary | ICD-10-CM | POA: Diagnosis not present

## 2020-07-12 DIAGNOSIS — Z961 Presence of intraocular lens: Secondary | ICD-10-CM | POA: Diagnosis not present

## 2020-07-12 DIAGNOSIS — H524 Presbyopia: Secondary | ICD-10-CM | POA: Diagnosis not present

## 2020-07-19 DIAGNOSIS — I10 Essential (primary) hypertension: Secondary | ICD-10-CM | POA: Diagnosis not present

## 2020-07-19 DIAGNOSIS — M069 Rheumatoid arthritis, unspecified: Secondary | ICD-10-CM | POA: Diagnosis not present

## 2020-07-19 DIAGNOSIS — J449 Chronic obstructive pulmonary disease, unspecified: Secondary | ICD-10-CM | POA: Diagnosis not present

## 2020-07-19 DIAGNOSIS — E1165 Type 2 diabetes mellitus with hyperglycemia: Secondary | ICD-10-CM | POA: Diagnosis not present

## 2020-07-19 DIAGNOSIS — Z299 Encounter for prophylactic measures, unspecified: Secondary | ICD-10-CM | POA: Diagnosis not present

## 2020-08-01 DIAGNOSIS — E1165 Type 2 diabetes mellitus with hyperglycemia: Secondary | ICD-10-CM | POA: Diagnosis not present

## 2020-08-01 DIAGNOSIS — J440 Chronic obstructive pulmonary disease with acute lower respiratory infection: Secondary | ICD-10-CM | POA: Diagnosis not present

## 2020-08-01 DIAGNOSIS — E7800 Pure hypercholesterolemia, unspecified: Secondary | ICD-10-CM | POA: Diagnosis not present

## 2020-08-01 DIAGNOSIS — I10 Essential (primary) hypertension: Secondary | ICD-10-CM | POA: Diagnosis not present

## 2020-08-02 DIAGNOSIS — E119 Type 2 diabetes mellitus without complications: Secondary | ICD-10-CM | POA: Diagnosis not present

## 2020-09-01 DIAGNOSIS — E119 Type 2 diabetes mellitus without complications: Secondary | ICD-10-CM | POA: Diagnosis not present

## 2020-09-13 DIAGNOSIS — Z13820 Encounter for screening for osteoporosis: Secondary | ICD-10-CM | POA: Diagnosis not present

## 2020-09-13 DIAGNOSIS — Z780 Asymptomatic menopausal state: Secondary | ICD-10-CM | POA: Diagnosis not present

## 2020-09-13 DIAGNOSIS — M109 Gout, unspecified: Secondary | ICD-10-CM | POA: Diagnosis not present

## 2020-09-13 DIAGNOSIS — Z79899 Other long term (current) drug therapy: Secondary | ICD-10-CM | POA: Diagnosis not present

## 2020-09-13 DIAGNOSIS — M0579 Rheumatoid arthritis with rheumatoid factor of multiple sites without organ or systems involvement: Secondary | ICD-10-CM | POA: Diagnosis not present

## 2020-09-28 DIAGNOSIS — J449 Chronic obstructive pulmonary disease, unspecified: Secondary | ICD-10-CM | POA: Diagnosis not present

## 2020-09-28 DIAGNOSIS — G4700 Insomnia, unspecified: Secondary | ICD-10-CM | POA: Diagnosis not present

## 2020-09-28 DIAGNOSIS — G894 Chronic pain syndrome: Secondary | ICD-10-CM | POA: Diagnosis not present

## 2020-09-28 DIAGNOSIS — M5000 Cervical disc disorder with myelopathy, unspecified cervical region: Secondary | ICD-10-CM | POA: Diagnosis not present

## 2020-09-28 DIAGNOSIS — K5903 Drug induced constipation: Secondary | ICD-10-CM | POA: Diagnosis not present

## 2020-09-28 DIAGNOSIS — Z79891 Long term (current) use of opiate analgesic: Secondary | ICD-10-CM | POA: Diagnosis not present

## 2020-09-28 DIAGNOSIS — M5459 Other low back pain: Secondary | ICD-10-CM | POA: Diagnosis not present

## 2020-09-28 DIAGNOSIS — G4733 Obstructive sleep apnea (adult) (pediatric): Secondary | ICD-10-CM | POA: Diagnosis not present

## 2020-09-28 DIAGNOSIS — M0560 Rheumatoid arthritis of unspecified site with involvement of other organs and systems: Secondary | ICD-10-CM | POA: Diagnosis not present

## 2020-09-30 DIAGNOSIS — E119 Type 2 diabetes mellitus without complications: Secondary | ICD-10-CM | POA: Diagnosis not present

## 2020-10-02 DIAGNOSIS — J440 Chronic obstructive pulmonary disease with acute lower respiratory infection: Secondary | ICD-10-CM | POA: Diagnosis not present

## 2020-10-02 DIAGNOSIS — E119 Type 2 diabetes mellitus without complications: Secondary | ICD-10-CM | POA: Diagnosis not present

## 2020-10-02 DIAGNOSIS — I10 Essential (primary) hypertension: Secondary | ICD-10-CM | POA: Diagnosis not present

## 2020-10-02 DIAGNOSIS — E1165 Type 2 diabetes mellitus with hyperglycemia: Secondary | ICD-10-CM | POA: Diagnosis not present

## 2020-10-02 DIAGNOSIS — E7800 Pure hypercholesterolemia, unspecified: Secondary | ICD-10-CM | POA: Diagnosis not present

## 2020-10-06 DIAGNOSIS — J209 Acute bronchitis, unspecified: Secondary | ICD-10-CM | POA: Diagnosis not present

## 2020-10-06 DIAGNOSIS — Z87891 Personal history of nicotine dependence: Secondary | ICD-10-CM | POA: Diagnosis not present

## 2020-10-06 DIAGNOSIS — Z299 Encounter for prophylactic measures, unspecified: Secondary | ICD-10-CM | POA: Diagnosis not present

## 2020-10-06 DIAGNOSIS — M069 Rheumatoid arthritis, unspecified: Secondary | ICD-10-CM | POA: Diagnosis not present

## 2020-10-06 DIAGNOSIS — J440 Chronic obstructive pulmonary disease with acute lower respiratory infection: Secondary | ICD-10-CM | POA: Diagnosis not present

## 2020-10-06 DIAGNOSIS — Z1231 Encounter for screening mammogram for malignant neoplasm of breast: Secondary | ICD-10-CM | POA: Diagnosis not present

## 2020-10-06 DIAGNOSIS — I10 Essential (primary) hypertension: Secondary | ICD-10-CM | POA: Diagnosis not present

## 2020-10-27 DIAGNOSIS — H01001 Unspecified blepharitis right upper eyelid: Secondary | ICD-10-CM | POA: Diagnosis not present

## 2020-10-27 DIAGNOSIS — H01004 Unspecified blepharitis left upper eyelid: Secondary | ICD-10-CM | POA: Diagnosis not present

## 2020-10-27 DIAGNOSIS — H01002 Unspecified blepharitis right lower eyelid: Secondary | ICD-10-CM | POA: Diagnosis not present

## 2020-10-27 DIAGNOSIS — H26492 Other secondary cataract, left eye: Secondary | ICD-10-CM | POA: Diagnosis not present

## 2020-11-01 DIAGNOSIS — I10 Essential (primary) hypertension: Secondary | ICD-10-CM | POA: Diagnosis not present

## 2020-11-01 DIAGNOSIS — Z299 Encounter for prophylactic measures, unspecified: Secondary | ICD-10-CM | POA: Diagnosis not present

## 2020-11-01 DIAGNOSIS — E1165 Type 2 diabetes mellitus with hyperglycemia: Secondary | ICD-10-CM | POA: Diagnosis not present

## 2020-11-01 DIAGNOSIS — M069 Rheumatoid arthritis, unspecified: Secondary | ICD-10-CM | POA: Diagnosis not present

## 2020-11-01 DIAGNOSIS — Z794 Long term (current) use of insulin: Secondary | ICD-10-CM | POA: Diagnosis not present

## 2020-11-01 DIAGNOSIS — J449 Chronic obstructive pulmonary disease, unspecified: Secondary | ICD-10-CM | POA: Diagnosis not present

## 2020-11-02 DIAGNOSIS — E119 Type 2 diabetes mellitus without complications: Secondary | ICD-10-CM | POA: Diagnosis not present

## 2020-11-22 NOTE — Progress Notes (Signed)
Referring Provider: Glenda Chroman, MD Primary Care Physician:  Glenda Chroman, MD Primary GI Physician: Dr. Gala Romney  Chief Complaint  Patient presents with   Constipation   Gastroesophageal Reflux    Little, mostly at night    HPI:   Brittney Martinez is a 65 y.o. female with history of IBS-C, GERD, failed Nissen fundoplication, internal hemorrhoids, and dysphagia.  EGD and colonoscopy on file from September 2020.  EGD with normal esophagus s/p empiric dilation, erosive gastropathy with benign biopsy.  Colonoscopy with nonbleeding internal hemorrhoids, due for repeat in 2025 due to family history of colon cancer. BPE January 2021 with question of partially failed fundoplication wrap, normal esophageal distention, minimal esophageal dysmotility, 12.5 mm barium tablet passed without obstruction.  Per prior documentation, patient apparently followed up with her surgeon who stated redoing fundoplication would likely cause more pain.  Last seen in our office 05/18/2020. GERD most evenings. Has been using Carafate which helps in addition to [place]. On Amitiza, constipation breakthrough about 1-2 times a week. Metamucil can cuse diarrhea.  Levsin controls abdominal pain well.  No other significant GI symptoms.  No alarm symptoms.  Recommended taking Carafate 1 hour before bed to prevent nocturnal GERD symptoms.  Continue Amitiza, Levsin, and try one half dose of Metamucil to manage breakthrough constipation.  Follow-up in 6 months.  Today:   GERD: Symptoms most every evening.  Associated nausea a few times a week without vomiting.  Denies associated abdominal pain.  Taking Dexilant daily. Carafate every 2-3 days.  When taking Carafate each evening as Randall Hiss had recommended, her symptoms did improve, but she slacked off on this.  Previously tried pantoprazole and Nexium.  States Nexium works fairly well, but insurance would only pay for once a day.  400 mg ibuprofen about 3 times a week for  arthritis.  IBS-C: Completed an antibiotic about 3 weeks ago for Bronchitis. Noticed increased constipation at that time.  Currently taking Amitiza 8 mcg BID and metamucil 1/2 dose every other day. Bowels are moving twice a day but are like "little rocks". Little lower abdominal discomfort that improves with bowel movements.  She also takes Levsin as needed for her lower abdominal pain.  Reports she has been taking it a little more frequently over the last few weeks.  No blood in the stool or black stool.   Down 13 pounds over the last 6 months.  Reports this is intentional with limiting sweets and snack foods in efforts to control her diabetes.  Currently eating 3 meals a day.  Rarely snacks.  States she can eat more, but is intentionally limiting her intake.   Past Medical History:  Diagnosis Date   Abdominal aortic stenosis    Anal fissure    Anxiety    Asthma    Complication of anesthesia    COPD (chronic obstructive pulmonary disease) (HCC)    Depression    grief   Diabetes mellitus    Diverticulitis    GERD (gastroesophageal reflux disease)    Glaucoma    Gout    Hypertension    IBS (irritable bowel syndrome)    PONV (postoperative nausea and vomiting)    RA (rheumatoid arthritis) ()    Sleep apnea    had one at one time but not anymore   Stroke ([place])    "mini-stroke"- no deficits   TIA (transient ischemic attack)    2010. No deficits    Past Surgical History:  Procedure Laterality  Date   ABDOMINAL HYSTERECTOMY  1982   complete   BIOPSY  11/17/2018   Procedure: BIOPSY;  Surgeon: Daneil Dolin, MD;  [facility];  Service: Endoscopy;;   BREAST LUMPECTOMY     right-benign   CATARACT EXTRACTION W/PHACO Right 05/13/2017   Procedure: CATARACT EXTRACTION PHACO AND INTRAOCULAR LENS PLACEMENT ([place]);  Surgeon: Tonny Branch, MD;  Location: [facility];  Service: Ophthalmology;  Laterality: Right;  CDE: 8.59   CATARACT EXTRACTION W/PHACO Left 05/27/2017   Procedure:  CATARACT EXTRACTION WITH  PHACOEMULSIFICATION AND INTRAOCULAR LENS PLACEMENT LEFT EYE;  Surgeon: Tonny Branch, MD;  Location: [facility];  Service: Ophthalmology;  Laterality: Left;  CDE: 5.66   CHOLECYSTECTOMY  2003   COLONOSCOPY  12/14/09   Dr. Gala Romney :anal papilla and hemorrhoids,diminutive hperplastic rectal polyps/normal colon   COLONOSCOPY N/A 10/29/2013   [facility]:KGSU papilla and internal hemorrhoids; colonic polyps-removed as described above. I suspect benign anorectal bleeding in the setting of hemorrhoids and  possibly fissure. tubular adenoma. next TCS 10/2020.   COLONOSCOPY WITH PROPOFOL N/A 11/17/2018   Dr. Gala Romney: Nonbleeding internal hemorrhoids, hemorrhoids were moderate and grade.  Exam otherwise normal.  Colonoscopy in 5 years.   ELBOW SURGERY     right   ESOPHAGOGASTRODUODENOSCOPY  12/14/09   Dr. Gala Romney :schatzkis ring 37F, otherwise normal   ESOPHAGOGASTRODUODENOSCOPY N/A 10/29/2013   RMR: Distal esophageal pseudodiverticulum/Nissen fundoplication   ESOPHAGOGASTRODUODENOSCOPY (EGD) WITH PROPOFOL N/A 11/17/2018   Dr. Gala Romney: Normal esophagus status post dilation.  Nonbleeding erosive gastropathy with benign biopsy.   FINGER SURGERY     on right middle, pointer, and index fingers   FISSURECTOMY     several   INCISIONAL HERNIA REPAIR N/A 03/22/2014   Procedure: [name] HERNIORRHAPHY;  Surgeon: Jamesetta So, MD;  Location: [facility];  Service: General;  Laterality: N/A;   INSERTION OF MESH N/A 03/22/2014   Procedure: INSERTION OF MESH;  Surgeon: Jamesetta So, MD;  Location: [facility];  Service: General;  Laterality: N/A;   MALONEY DILATION N/A 11/17/2018   Procedure: [name] DILATION;  Surgeon: Daneil Dolin, MD;  Location: [facility];  Service: Endoscopy;  Laterality: N/A;   NISSEN FUNDOPLICATION  1103   [facility]   WRIST SURGERY     left    Current Outpatient Medications  Medication Sig Dispense Refill   Abatacept (ORENCIA) 125 MG/ML SOSY Inject into the skin once a week.      albuterol (PROVENTIL HFA;VENTOLIN HFA) 108 (90 BASE) MCG/ACT inhaler 2 puffs as needed for wheezing or shortness of breath. Take 2 puffs twice a day and every six hours as needed for shortness of breath.     albuterol (PROVENTIL) (2.5 MG/3ML) 0.083% nebulizer solution Take 2.5 mg by nebulization as needed for wheezing or shortness of breath.      allopurinol (ZYLOPRIM) 300 MG tablet Take 300 mg by mouth daily.     amLODipine (NORVASC) 5 MG tablet Take 5 mg by mouth every morning.      aspirin 81 MG tablet Take 81 mg by mouth daily.     atorvastatin (LIPITOR) 10 MG tablet Take 10 mg by mouth at bedtime.      budesonide-formoterol (SYMBICORT) 160-4.5 MCG/ACT inhaler Inhale 1 puff into the lungs as needed.      calcium carbonate (OS-CAL) 600 MG TABS Take 600 mg by mouth 2 (two) times daily with a meal.     clopidogrel (PLAVIX) 75 MG tablet Take 75 mg by mouth daily.  cycloSPORINE (RESTASIS) 0.05 % ophthalmic emulsion Place 1 drop into both eyes 2 (two) times daily.     dexlansoprazole (DEXILANT) 60 MG capsule Take 1 capsule (60 mg total) by mouth daily. 90 capsule 3   doxepin (SINEQUAN) 75 MG capsule Take 75 mg by mouth at bedtime.     etanercept (ENBREL) 50 MG/ML injection Inject 50 mg into the skin every 14 (fourteen) days.     folic acid (FOLVITE) 1 MG tablet Take 1 mg by mouth daily.      glipiZIDE (GLUCOTROL) 10 MG tablet Take 10 mg by mouth 2 (two) times daily before a meal.     hyoscyamine (LEVSIN SL) 0.125 MG SL tablet Place 1 tablet (0.125 mg total) under the tongue every 6 (six) hours as needed for cramping. 90 tablet 3   ibuprofen (ADVIL,MOTRIN) 200 MG tablet Take 400 mg by mouth as needed for headache or moderate pain.      insulin detemir (LEVEMIR) 100 UNIT/ML injection Inject 40 Units into the skin daily.     lubiprostone (AMITIZA) 8 MCG capsule TAKE 1 CAPSULE BY MOUTH  ONCE DAILY WITH BREAKFAST 90 capsule 3   metoprolol (TOPROL-XL) 100 MG 24 hr tablet Take 100 mg by mouth every  morning.     nitroGLYCERIN (NITROGLYN) 2 % ointment Apply 1 inch topically every 8 (eight) hours as needed (hemorrhoids).     sucralfate (CARAFATE) 1 GM/10ML suspension Take 10 mLs (1 g total) by mouth 4 (four) times daily. (Patient taking differently: Take 1 g by mouth 4 (four) times daily. As needed) 420 mL 1   No current facility-administered medications for this visit.    Allergies as of 11/23/2020 - Review Complete 11/23/2020  Allergen Reaction Noted   Aspirin Nausea Only    Propoxyphene n-acetaminophen Nausea And Vomiting    Zithromax [azithromycin] Nausea Only 10/01/2010   Erythromycin Rash 02/05/2014    Family History  Problem Relation Age of Onset   Colon cancer Father 9   Hypertension Sister    Hypertension Brother    Kidney disease Brother     Social History   Socioeconomic History   Marital status: Widowed    Spouse name: Not on file   Number of children: Not on file   Years of education: Not on file   Highest education level: Not on file  Occupational History   Occupation: disabled  Tobacco Use   Smoking status: Former    Packs/day: 0.25    Years: 19.00    Pack years: 4.75    Types: Cigarettes    Quit date: 01/09/2007    Years since quitting: 13.8   Smokeless tobacco: Never  Vaping Use   Vaping Use: Never used  Substance and Sexual Activity   Alcohol use: No   Drug use: No   Sexual activity: Never  Other Topics Concern   Not on file  Social History Narrative   1 son-healthy   1 daughter-MVA (drunk-driver)   Social Determinants of Health   Financial Resource Strain: Not on file  Food Insecurity: Not on file  Transportation Needs: Not on file  Physical Activity: Not on file  Stress: Not on file  Social Connections: Not on file    Review of Systems: Gen: Denies fever, chills, cold or flulike symptoms, presyncope, syncope. CV: Denies chest pain, palpitations. Resp: Denies dyspnea or cough.Marland Kitchen GI: See HPI Heme: See HPI  Physical Exam: BP  119/79   Pulse 95   Temp (!) 96.9 F (36.1 C) (  Temporal)   Ht 5\' 2"  (1.575 m)   Wt 167 lb 3.2 oz (75.8 kg)   BMI 30.58 kg/m  General:   Alert and oriented. No distress noted. Pleasant and cooperative.  Head:  Normocephalic and atraumatic. Eyes:  Conjuctiva clear without scleral icterus. Heart:  S1, S2 present without murmurs appreciated. Lungs:  Clear to auscultation bilaterally. Few crackles in the right lower lung base. No wheezes or rhonchi. No distress.  Abdomen:  +BS, soft, non-tender and non-distended. No rebound or guarding. No HSM or masses noted. Msk:  Symmetrical without gross deformities. Normal posture. Extremities:  Without edema. Neurologic:  Alert and  oriented x4 Psych:  Normal mood and affect.   Assessment:  65 year old female with history of IBS-C, GERD, failed Niesen fundoplication, internal hemorrhoids, and history of dysphagia s/p empiric dilation in 2020 presenting today for follow-up on GERD and IBS-7.  GERD:  Not adequately managed.  Breakthrough symptoms after dinner several days of the week.  Associated intermittent nausea without vomiting.  No alarm symptoms.  Currently on Dexilant 60 mg daily and Carafate every 2 to 3 days.  Symptoms previously under better control when taking Carafate nightly.  She is also been on pantoprazole and Nexium in the past.  Reports Nexium worked fairly well, but insurance would not cover twice daily dosing.  We will have her resume Carafate nightly for now and continue Dexilant.  If she continues with breakthrough symptoms, consider changing PPI.  IBS-C:  Increased constipation over the last 2-3 weeks with small hard stools twice daily.  Currently taking Amitiza 8 mcg twice daily, Metamucil one half dose every other day, and Levsin as needed for associated lower abdominal cramping prior to bowel movements that improves thereafter.  She does report some increased use of Levsin recently which I suspect is contributing to to increased  constipation.  She has no alarm symptoms.  Her colonoscopy is up-to-date, due for repeat in 2025.  Recommended limiting use of Levsin and increasing Metamucil to a full dose daily.  She is to call if ongoing symptoms in 2 to 4 weeks.   Plan: Continue Dexilant 60 mg daily. Resume Carafate every evening before dinner. Reinforced GERD diet/lifestyle. Limit all NSAIDs and use Tylenol for pain. Continue Amitiza 8 mcg twice daily with meals. Increase Metamucil to 1 full dose daily. Limit Levsin for now in the setting of worsening constipation. Requested patient call in 2 to 4 weeks if he continues with breakthrough reflux/nausea symptoms or ongoing constipation. Follow-up in 4 months.    Aliene Altes, PA-C [facility] 11/23/2020

## 2020-11-23 ENCOUNTER — Encounter: Payer: Self-pay | Admitting: Gastroenterology

## 2020-11-23 ENCOUNTER — Other Ambulatory Visit: Payer: Self-pay

## 2020-11-23 ENCOUNTER — Ambulatory Visit: Payer: Medicare Other | Admitting: Nurse Practitioner

## 2020-11-23 ENCOUNTER — Ambulatory Visit (INDEPENDENT_AMBULATORY_CARE_PROVIDER_SITE_OTHER): Payer: Medicare Other | Admitting: Gastroenterology

## 2020-11-23 VITALS — BP 119/79 | HR 95 | Temp 96.9°F | Ht 62.0 in | Wt 167.2 lb

## 2020-11-23 DIAGNOSIS — K219 Gastro-esophageal reflux disease without esophagitis: Secondary | ICD-10-CM | POA: Diagnosis not present

## 2020-11-23 DIAGNOSIS — K581 Irritable bowel syndrome with constipation: Secondary | ICD-10-CM

## 2020-11-23 NOTE — Patient Instructions (Signed)
For constipation: Continue taking Amitiza 8 mcg twice daily with breakfast and dinner. Increase Metamucil to a full dose daily to see if this will improve your constipation without causing diarrhea. Try limiting Levsin as this will cause constipation as well. If you continue with constipation in 2-4 weeks, please let me know and we can increase Amitiza.  For reflux/nausea: Continue Dexilant 60 mg daily. Resume taking Carafate each evening prior to dinner to see if this will prevent your heartburn/nausea symptoms. If you continue to have problems with breakthrough reflux/nausea in 2-4 weeks, please let me know and we can make further adjustments.  Follow a GERD diet:  Avoid fried, fatty, greasy, spicy, citrus foods. Avoid caffeine and carbonated beverages. Avoid chocolate. Try eating 4-6 small meals a day rather than 3 large meals. Do not eat within 3 hours of laying down. Prop head of bed up on wood or bricks to create a 6 inch incline.  Limit ibuprofen and all other NSAID products including Aleve, Advil, naproxen, BC powders, Goody powders, and anything that says "NSAID" on the package.  Use Tylenol for pain.  No more than 3000 mg/day.  We will plan to see back in about 4 months.  Do not hesitate to call if you have any questions or concerns prior to your next visit.   It was a pleasure meeting you today!  Aliene Altes, PA-C [facility]astro[facility]

## 2020-12-02 DIAGNOSIS — E1165 Type 2 diabetes mellitus with hyperglycemia: Secondary | ICD-10-CM | POA: Diagnosis not present

## 2020-12-02 DIAGNOSIS — E119 Type 2 diabetes mellitus without complications: Secondary | ICD-10-CM | POA: Diagnosis not present

## 2020-12-02 DIAGNOSIS — I10 Essential (primary) hypertension: Secondary | ICD-10-CM | POA: Diagnosis not present

## 2020-12-05 DIAGNOSIS — R296 Repeated falls: Secondary | ICD-10-CM | POA: Diagnosis not present

## 2020-12-05 DIAGNOSIS — I6381 Other cerebral infarction due to occlusion or stenosis of small artery: Secondary | ICD-10-CM | POA: Diagnosis not present

## 2020-12-05 DIAGNOSIS — I69359 Hemiplegia and hemiparesis following cerebral infarction affecting unspecified side: Secondary | ICD-10-CM | POA: Diagnosis not present

## 2020-12-05 DIAGNOSIS — G4733 Obstructive sleep apnea (adult) (pediatric): Secondary | ICD-10-CM | POA: Diagnosis not present

## 2020-12-06 ENCOUNTER — Other Ambulatory Visit (HOSPITAL_COMMUNITY): Payer: Self-pay | Admitting: Neurology

## 2020-12-06 ENCOUNTER — Other Ambulatory Visit: Payer: Self-pay | Admitting: Neurology

## 2020-12-06 DIAGNOSIS — I6381 Other cerebral infarction due to occlusion or stenosis of small artery: Secondary | ICD-10-CM

## 2020-12-19 ENCOUNTER — Other Ambulatory Visit: Payer: Self-pay

## 2020-12-19 ENCOUNTER — Ambulatory Visit (HOSPITAL_COMMUNITY)
Admission: RE | Admit: 2020-12-19 | Discharge: 2020-12-19 | Disposition: A | Discharge status: Home / Self Care | Payer: Medicare Other | Source: Ambulatory Visit | Attending: Neurology | Admitting: Neurology

## 2020-12-19 DIAGNOSIS — I6381 Other cerebral infarction due to occlusion or stenosis of small artery: Secondary | ICD-10-CM | POA: Insufficient documentation

## 2020-12-19 DIAGNOSIS — R42 Dizziness and giddiness: Secondary | ICD-10-CM | POA: Diagnosis not present

## 2020-12-19 DIAGNOSIS — R29898 Other symptoms and signs involving the musculoskeletal system: Secondary | ICD-10-CM | POA: Diagnosis not present

## 2020-12-19 DIAGNOSIS — R531 Weakness: Secondary | ICD-10-CM | POA: Diagnosis not present

## 2020-12-20 DIAGNOSIS — Z23 Encounter for immunization: Secondary | ICD-10-CM | POA: Diagnosis not present

## 2020-12-20 DIAGNOSIS — M109 Gout, unspecified: Secondary | ICD-10-CM | POA: Diagnosis not present

## 2020-12-20 DIAGNOSIS — Z13820 Encounter for screening for osteoporosis: Secondary | ICD-10-CM | POA: Diagnosis not present

## 2020-12-20 DIAGNOSIS — M0579 Rheumatoid arthritis with rheumatoid factor of multiple sites without organ or systems involvement: Secondary | ICD-10-CM | POA: Diagnosis not present

## 2020-12-20 DIAGNOSIS — Z79899 Other long term (current) drug therapy: Secondary | ICD-10-CM | POA: Diagnosis not present

## 2020-12-28 DIAGNOSIS — G4733 Obstructive sleep apnea (adult) (pediatric): Secondary | ICD-10-CM | POA: Diagnosis not present

## 2020-12-28 DIAGNOSIS — G4700 Insomnia, unspecified: Secondary | ICD-10-CM | POA: Diagnosis not present

## 2020-12-28 DIAGNOSIS — Z79891 Long term (current) use of opiate analgesic: Secondary | ICD-10-CM | POA: Diagnosis not present

## 2020-12-28 DIAGNOSIS — M5459 Other low back pain: Secondary | ICD-10-CM | POA: Diagnosis not present

## 2020-12-28 DIAGNOSIS — M5000 Cervical disc disorder with myelopathy, unspecified cervical region: Secondary | ICD-10-CM | POA: Diagnosis not present

## 2020-12-28 DIAGNOSIS — J449 Chronic obstructive pulmonary disease, unspecified: Secondary | ICD-10-CM | POA: Diagnosis not present

## 2020-12-28 DIAGNOSIS — I6930 Unspecified sequelae of cerebral infarction: Secondary | ICD-10-CM | POA: Diagnosis not present

## 2020-12-28 DIAGNOSIS — M0560 Rheumatoid arthritis of unspecified site with involvement of other organs and systems: Secondary | ICD-10-CM | POA: Diagnosis not present

## 2020-12-28 DIAGNOSIS — K5903 Drug induced constipation: Secondary | ICD-10-CM | POA: Diagnosis not present

## 2020-12-28 DIAGNOSIS — G894 Chronic pain syndrome: Secondary | ICD-10-CM | POA: Diagnosis not present

## 2021-01-02 DIAGNOSIS — E7800 Pure hypercholesterolemia, unspecified: Secondary | ICD-10-CM | POA: Diagnosis not present

## 2021-01-02 DIAGNOSIS — I10 Essential (primary) hypertension: Secondary | ICD-10-CM | POA: Diagnosis not present

## 2021-01-02 DIAGNOSIS — E1165 Type 2 diabetes mellitus with hyperglycemia: Secondary | ICD-10-CM | POA: Diagnosis not present

## 2021-01-02 DIAGNOSIS — J440 Chronic obstructive pulmonary disease with acute lower respiratory infection: Secondary | ICD-10-CM | POA: Diagnosis not present

## 2021-01-02 DIAGNOSIS — E119 Type 2 diabetes mellitus without complications: Secondary | ICD-10-CM | POA: Diagnosis not present

## 2021-01-20 DIAGNOSIS — I10 Essential (primary) hypertension: Secondary | ICD-10-CM | POA: Diagnosis not present

## 2021-01-20 DIAGNOSIS — Z0000 Encounter for general adult medical examination without abnormal findings: Secondary | ICD-10-CM | POA: Diagnosis not present

## 2021-01-20 DIAGNOSIS — Z79899 Other long term (current) drug therapy: Secondary | ICD-10-CM | POA: Diagnosis not present

## 2021-01-20 DIAGNOSIS — E7800 Pure hypercholesterolemia, unspecified: Secondary | ICD-10-CM | POA: Diagnosis not present

## 2021-01-20 DIAGNOSIS — R5383 Other fatigue: Secondary | ICD-10-CM | POA: Diagnosis not present

## 2021-01-20 DIAGNOSIS — Z299 Encounter for prophylactic measures, unspecified: Secondary | ICD-10-CM | POA: Diagnosis not present

## 2021-01-20 DIAGNOSIS — Z7189 Other specified counseling: Secondary | ICD-10-CM | POA: Diagnosis not present

## 2021-01-20 DIAGNOSIS — E0590 Thyrotoxicosis, unspecified without thyrotoxic crisis or storm: Secondary | ICD-10-CM | POA: Diagnosis not present

## 2021-02-01 DIAGNOSIS — E119 Type 2 diabetes mellitus without complications: Secondary | ICD-10-CM | POA: Diagnosis not present

## 2021-02-08 DIAGNOSIS — E7800 Pure hypercholesterolemia, unspecified: Secondary | ICD-10-CM | POA: Diagnosis not present

## 2021-02-08 DIAGNOSIS — E1165 Type 2 diabetes mellitus with hyperglycemia: Secondary | ICD-10-CM | POA: Diagnosis not present

## 2021-02-08 DIAGNOSIS — I10 Essential (primary) hypertension: Secondary | ICD-10-CM | POA: Diagnosis not present

## 2021-02-08 DIAGNOSIS — Z794 Long term (current) use of insulin: Secondary | ICD-10-CM | POA: Diagnosis not present

## 2021-02-08 DIAGNOSIS — Z299 Encounter for prophylactic measures, unspecified: Secondary | ICD-10-CM | POA: Diagnosis not present

## 2021-02-16 ENCOUNTER — Encounter: Payer: Self-pay | Admitting: Gastroenterology

## 2021-02-16 DIAGNOSIS — Z299 Encounter for prophylactic measures, unspecified: Secondary | ICD-10-CM | POA: Diagnosis not present

## 2021-02-16 DIAGNOSIS — R590 Localized enlarged lymph nodes: Secondary | ICD-10-CM | POA: Diagnosis not present

## 2021-02-16 DIAGNOSIS — I10 Essential (primary) hypertension: Secondary | ICD-10-CM | POA: Diagnosis not present

## 2021-02-16 DIAGNOSIS — Z713 Dietary counseling and surveillance: Secondary | ICD-10-CM | POA: Diagnosis not present

## 2021-03-03 DIAGNOSIS — E119 Type 2 diabetes mellitus without complications: Secondary | ICD-10-CM | POA: Diagnosis not present

## 2021-03-03 DIAGNOSIS — J440 Chronic obstructive pulmonary disease with acute lower respiratory infection: Secondary | ICD-10-CM | POA: Diagnosis not present

## 2021-03-03 DIAGNOSIS — D849 Immunodeficiency, unspecified: Secondary | ICD-10-CM | POA: Diagnosis not present

## 2021-03-03 DIAGNOSIS — M069 Rheumatoid arthritis, unspecified: Secondary | ICD-10-CM | POA: Diagnosis not present

## 2021-03-16 DIAGNOSIS — J449 Chronic obstructive pulmonary disease, unspecified: Secondary | ICD-10-CM | POA: Diagnosis not present

## 2021-03-16 DIAGNOSIS — J209 Acute bronchitis, unspecified: Secondary | ICD-10-CM | POA: Diagnosis not present

## 2021-03-16 DIAGNOSIS — Z299 Encounter for prophylactic measures, unspecified: Secondary | ICD-10-CM | POA: Diagnosis not present

## 2021-03-16 DIAGNOSIS — J440 Chronic obstructive pulmonary disease with acute lower respiratory infection: Secondary | ICD-10-CM | POA: Diagnosis not present

## 2021-03-16 DIAGNOSIS — I10 Essential (primary) hypertension: Secondary | ICD-10-CM | POA: Diagnosis not present

## 2021-03-21 DIAGNOSIS — G894 Chronic pain syndrome: Secondary | ICD-10-CM | POA: Diagnosis not present

## 2021-03-21 DIAGNOSIS — R69 Illness, unspecified: Secondary | ICD-10-CM | POA: Diagnosis not present

## 2021-03-23 DIAGNOSIS — M0579 Rheumatoid arthritis with rheumatoid factor of multiple sites without organ or systems involvement: Secondary | ICD-10-CM | POA: Diagnosis not present

## 2021-03-23 DIAGNOSIS — Z13820 Encounter for screening for osteoporosis: Secondary | ICD-10-CM | POA: Diagnosis not present

## 2021-03-23 DIAGNOSIS — R69 Illness, unspecified: Secondary | ICD-10-CM | POA: Diagnosis not present

## 2021-03-23 DIAGNOSIS — M109 Gout, unspecified: Secondary | ICD-10-CM | POA: Diagnosis not present

## 2021-03-23 DIAGNOSIS — Z79899 Other long term (current) drug therapy: Secondary | ICD-10-CM | POA: Diagnosis not present

## 2021-04-02 DIAGNOSIS — E119 Type 2 diabetes mellitus without complications: Secondary | ICD-10-CM | POA: Diagnosis not present

## 2021-04-04 DIAGNOSIS — M069 Rheumatoid arthritis, unspecified: Secondary | ICD-10-CM | POA: Diagnosis not present

## 2021-04-04 DIAGNOSIS — D849 Immunodeficiency, unspecified: Secondary | ICD-10-CM | POA: Diagnosis not present

## 2021-04-04 DIAGNOSIS — J440 Chronic obstructive pulmonary disease with acute lower respiratory infection: Secondary | ICD-10-CM | POA: Diagnosis not present

## 2021-04-19 ENCOUNTER — Ambulatory Visit: Payer: Medicare Other | Admitting: Gastroenterology

## 2021-04-28 ENCOUNTER — Other Ambulatory Visit: Payer: Self-pay | Admitting: Internal Medicine

## 2021-04-28 ENCOUNTER — Other Ambulatory Visit (HOSPITAL_COMMUNITY): Payer: Self-pay | Admitting: Internal Medicine

## 2021-04-28 DIAGNOSIS — R590 Localized enlarged lymph nodes: Secondary | ICD-10-CM

## 2021-05-02 DIAGNOSIS — E119 Type 2 diabetes mellitus without complications: Secondary | ICD-10-CM | POA: Diagnosis not present

## 2021-05-16 DIAGNOSIS — Z794 Long term (current) use of insulin: Secondary | ICD-10-CM | POA: Diagnosis not present

## 2021-05-16 DIAGNOSIS — I10 Essential (primary) hypertension: Secondary | ICD-10-CM | POA: Diagnosis not present

## 2021-05-16 DIAGNOSIS — Z299 Encounter for prophylactic measures, unspecified: Secondary | ICD-10-CM | POA: Diagnosis not present

## 2021-05-16 DIAGNOSIS — E1165 Type 2 diabetes mellitus with hyperglycemia: Secondary | ICD-10-CM | POA: Diagnosis not present

## 2021-05-16 DIAGNOSIS — J42 Unspecified chronic bronchitis: Secondary | ICD-10-CM | POA: Diagnosis not present

## 2021-05-24 ENCOUNTER — Ambulatory Visit (HOSPITAL_COMMUNITY)
Admission: RE | Admit: 2021-05-24 | Discharge: 2021-05-24 | Disposition: A | Discharge status: Home / Self Care | Payer: Medicare Other | Source: Ambulatory Visit | Attending: Internal Medicine | Admitting: Internal Medicine

## 2021-05-24 ENCOUNTER — Other Ambulatory Visit: Payer: Self-pay

## 2021-05-24 ENCOUNTER — Encounter (HOSPITAL_COMMUNITY): Payer: Self-pay | Admitting: Radiology

## 2021-05-24 DIAGNOSIS — K7689 Other specified diseases of liver: Secondary | ICD-10-CM | POA: Diagnosis not present

## 2021-05-24 DIAGNOSIS — I700 Atherosclerosis of aorta: Secondary | ICD-10-CM | POA: Diagnosis not present

## 2021-05-24 DIAGNOSIS — M5136 Other intervertebral disc degeneration, lumbar region: Secondary | ICD-10-CM | POA: Diagnosis not present

## 2021-05-24 DIAGNOSIS — R590 Localized enlarged lymph nodes: Secondary | ICD-10-CM

## 2021-05-24 DIAGNOSIS — I2510 Atherosclerotic heart disease of native coronary artery without angina pectoris: Secondary | ICD-10-CM | POA: Diagnosis not present

## 2021-05-24 DIAGNOSIS — J929 Pleural plaque without asbestos: Secondary | ICD-10-CM | POA: Diagnosis not present

## 2021-05-24 DIAGNOSIS — R221 Localized swelling, mass and lump, neck: Secondary | ICD-10-CM | POA: Diagnosis not present

## 2021-05-24 DIAGNOSIS — J432 Centrilobular emphysema: Secondary | ICD-10-CM | POA: Diagnosis not present

## 2021-05-24 LAB — POCT I-STAT CREATININE: Creatinine, Ser: 0.9 mg/dL (ref 0.44–1.00)

## 2021-05-24 MED ORDER — IOHEXOL 300 MG/ML  SOLN
100.0000 mL | Freq: Once | INTRAMUSCULAR | Status: AC | PRN
  Administered 2021-05-24: 130 mL via INTRAVENOUS

## 2021-05-30 DIAGNOSIS — R1310 Dysphagia, unspecified: Secondary | ICD-10-CM | POA: Diagnosis not present

## 2021-05-30 DIAGNOSIS — C787 Secondary malignant neoplasm of liver and intrahepatic bile duct: Secondary | ICD-10-CM | POA: Diagnosis not present

## 2021-05-30 DIAGNOSIS — I10 Essential (primary) hypertension: Secondary | ICD-10-CM | POA: Diagnosis not present

## 2021-05-30 DIAGNOSIS — Z299 Encounter for prophylactic measures, unspecified: Secondary | ICD-10-CM | POA: Diagnosis not present

## 2021-05-30 DIAGNOSIS — F17210 Nicotine dependence, cigarettes, uncomplicated: Secondary | ICD-10-CM | POA: Diagnosis not present

## 2021-06-01 DIAGNOSIS — E119 Type 2 diabetes mellitus without complications: Secondary | ICD-10-CM | POA: Diagnosis not present

## 2021-06-06 DIAGNOSIS — R590 Localized enlarged lymph nodes: Secondary | ICD-10-CM | POA: Insufficient documentation

## 2021-06-06 DIAGNOSIS — R160 Hepatomegaly, not elsewhere classified: Secondary | ICD-10-CM | POA: Insufficient documentation

## 2021-06-07 ENCOUNTER — Encounter: Payer: Self-pay | Admitting: *Deleted

## 2021-06-07 ENCOUNTER — Inpatient Hospital Stay (HOSPITAL_COMMUNITY): Payer: Medicare Other | Attending: Hematology | Admitting: Hematology

## 2021-06-07 ENCOUNTER — Inpatient Hospital Stay (HOSPITAL_COMMUNITY): Payer: Medicare Other

## 2021-06-07 ENCOUNTER — Encounter (HOSPITAL_COMMUNITY): Payer: Self-pay | Admitting: Hematology

## 2021-06-07 VITALS — BP 135/76 | HR 93 | Temp 98.2°F | Resp 18 | Ht 61.54 in | Wt 176.3 lb

## 2021-06-07 DIAGNOSIS — Z803 Family history of malignant neoplasm of breast: Secondary | ICD-10-CM | POA: Diagnosis not present

## 2021-06-07 DIAGNOSIS — R590 Localized enlarged lymph nodes: Secondary | ICD-10-CM | POA: Diagnosis not present

## 2021-06-07 DIAGNOSIS — F17210 Nicotine dependence, cigarettes, uncomplicated: Secondary | ICD-10-CM

## 2021-06-07 DIAGNOSIS — C787 Secondary malignant neoplasm of liver and intrahepatic bile duct: Secondary | ICD-10-CM | POA: Diagnosis not present

## 2021-06-07 DIAGNOSIS — C801 Malignant (primary) neoplasm, unspecified: Secondary | ICD-10-CM | POA: Insufficient documentation

## 2021-06-07 DIAGNOSIS — R160 Hepatomegaly, not elsewhere classified: Secondary | ICD-10-CM

## 2021-06-07 DIAGNOSIS — Z808 Family history of malignant neoplasm of other organs or systems: Secondary | ICD-10-CM

## 2021-06-07 DIAGNOSIS — R932 Abnormal findings on diagnostic imaging of liver and biliary tract: Secondary | ICD-10-CM | POA: Diagnosis not present

## 2021-06-07 DIAGNOSIS — R93422 Abnormal radiologic findings on diagnostic imaging of left kidney: Secondary | ICD-10-CM | POA: Diagnosis not present

## 2021-06-07 DIAGNOSIS — K769 Liver disease, unspecified: Secondary | ICD-10-CM

## 2021-06-07 DIAGNOSIS — Z800 Family history of malignant neoplasm of digestive organs: Secondary | ICD-10-CM | POA: Diagnosis not present

## 2021-06-07 DIAGNOSIS — R9389 Abnormal findings on diagnostic imaging of other specified body structures: Secondary | ICD-10-CM | POA: Diagnosis not present

## 2021-06-07 LAB — COMPREHENSIVE METABOLIC PANEL
ALT: 54 U/L — ABNORMAL HIGH (ref 0–44)
AST: 46 U/L — ABNORMAL HIGH (ref 15–41)
Albumin: 3.9 g/dL (ref 3.5–5.0)
Alkaline Phosphatase: 55 U/L (ref 38–126)
Anion gap: 8 (ref 5–15)
BUN: 10 mg/dL (ref 8–23)
CO2: 25 mmol/L (ref 22–32)
Calcium: 9.9 mg/dL (ref 8.9–10.3)
Chloride: 108 mmol/L (ref 98–111)
Creatinine, Ser: 0.89 mg/dL (ref 0.44–1.00)
GFR, Estimated: >60 mL/min (ref >60)
Glucose, Bld: 169 mg/dL — ABNORMAL HIGH (ref 70–99)
Potassium: 4.6 mmol/L (ref 3.5–5.1)
Sodium: 141 mmol/L (ref 135–145)
Total Bilirubin: 0.7 mg/dL (ref 0.3–1.2)
Total Protein: 7.5 g/dL (ref 6.5–8.1)

## 2021-06-07 LAB — CBC WITH DIFFERENTIAL/PLATELET
Abs Immature Granulocytes: 0.01 10*3/uL (ref 0.00–0.07)
Basophils Absolute: 0 10*3/uL (ref 0.0–0.1)
Basophils Relative: 1 %
Eosinophils Absolute: 0.1 10*3/uL (ref 0.0–0.5)
Eosinophils Relative: 1 %
HCT: 47.4 % — ABNORMAL HIGH (ref 36.0–46.0)
Hemoglobin: 15.2 g/dL — ABNORMAL HIGH (ref 12.0–15.0)
Immature Granulocytes: 0 %
Lymphocytes Relative: 26 %
Lymphs Abs: 1.4 10*3/uL (ref 0.7–4.0)
MCH: 30 pg (ref 26.0–34.0)
MCHC: 32.1 g/dL (ref 30.0–36.0)
MCV: 93.5 fL (ref 80.0–100.0)
Monocytes Absolute: 0.4 10*3/uL (ref 0.1–1.0)
Monocytes Relative: 7 %
Neutro Abs: 3.4 10*3/uL (ref 1.7–7.7)
Neutrophils Relative %: 65 %
Platelets: 199 10*3/uL (ref 150–400)
RBC: 5.07 MIL/uL (ref 3.87–5.11)
RDW: 16.1 % — ABNORMAL HIGH (ref 11.5–15.5)
WBC: 5.2 10*3/uL (ref 4.0–10.5)
nRBC: 0 % (ref 0.0–0.2)

## 2021-06-07 LAB — LACTATE DEHYDROGENASE: LDH: 324 U/L — ABNORMAL HIGH (ref 98–192)

## 2021-06-07 NOTE — Patient Instructions (Addendum)
[place] at [facility] ?Discharge Instructions ? ?You were seen and examined today by Dr. Delton Coombes. Dr. Delton Coombes is a medical oncologist, meaning that he specializes in the treatment of cancer diagnoses. Dr. Delton Coombes discussed your past medical history, family history of cancers, and the events that led to you being here today. ? ?You were referred to Dr. Delton Coombes due to an abnormal CT scan. The CT scan revealed numerous masses in your liver concerning for cancer as well as enlarged lymph nodes that are concerning for cancer. It is unclear where the cancer has arisen from but it looks like it has spread to the liver and lymph nodes. ? ?Dr. Delton Coombes has recommended additional lab work today. He will be ordering tumor markers. Tumor markers can help to identify where the cancer may have started but they are not diagnostic. You will need a biopsy to identify exactly what is going on, and this will hopefully identify the exact origin. ? ?Dr. Delton Coombes has also recommended a PET scan. A PET scan is a specialized CT scan that illuminates where there is cancer present in the body.  ? ?Follow-up with Dr. Delton Coombes approximately one week after the biopsy. ? ? ?Thank you for choosing [place] at [facility] to provide your oncology and hematology care.  To afford each patient quality time with our provider, please arrive at least 15 minutes before your scheduled appointment time.  ? ?If you have a lab appointment with the [place] please come in thru the Main Entrance and check in at the main information desk. ? ?You need to re-schedule your appointment should you arrive 10 or more minutes late.  We strive to give you quality time with our providers, and arriving late affects you and other patients whose appointments are after yours.  Also, if you no show three or more times for appointments you may be dismissed from the clinic at the providers discretion.      ?Again, thank you for choosing [facility].  Our hope is that these requests will decrease the amount of time that you wait before being seen by our physicians.       ?_____________________________________________________________ ? ?Should you have questions after your visit to [facility], please contact our office at [phone] and follow the prompts.  Our office hours are 8:00 a.m. and 4:30 p.m. Monday - Friday.  Please note that voicemails left after 4:00 p.m. may not be returned until the following business day.  We are closed weekends and major holidays.  You do have access to a nurse 24-7, just call the main number to the clinic [phone] and do not press any options, hold on the line and a nurse will answer the phone.   ? ?For prescription refill requests, have your pharmacy contact our office and allow 72 hours.   ? ?Due to Covid, you will need to wear a mask upon entering the hospital. If you do not have a mask, a mask will be given to you at the Main Entrance upon arrival. For doctor visits, patients may have 1 support person age 44 or older with them. For treatment visits, patients can not have anyone with them due to social distancing guidelines and our immunocompromised population.  ? ? ? ?

## 2021-06-07 NOTE — Progress Notes (Unsigned)
Arne Cleveland, MD  Roosvelt Maser ?Ok  ? ?[place] core biopsy Liver lesion/met  ? ?DDH   ?  ?   ?Previous Messages ?  ?----- Message -----  ?From: Roosvelt Maser  ?Sent: 06/07/2021   2:12 PM EDT  ?To: [facility] Procedure Requests  ?Subject: CT Biopsy                                      ? ? ? ?CT Biopsy  ? ??liver cancer, numerous areas of lymphadenopathy including L lower neck  ? ? ?CT done 3/25  ? ?Dr. Derek Jack  ?[phone]  ?

## 2021-06-07 NOTE — Progress Notes (Signed)
[facility] [address], [place] [address]   CLINIC:  Medical Oncology/Hematology  CONSULT NOTE  Patient Care Team: Glenda Chroman, MD as PCP - General (Internal Medicine) Gala Romney, Cristopher Estimable, MD (Gastroenterology)  CHIEF COMPLAINTS/PURPOSE OF CONSULTATION:  Evaluation of liver masses  HISTORY OF PRESENTING ILLNESS:  [name] 66 y.o. female is here because of evaluation of liver masses, at the request of Dr. Woody Seller.  Today she reports feeling good, and she is accompanied by her daughter-in-law. She reports swelling on the left side of her neck starting in November 2022. She has been on Ozempic for 8 months and has lost 26 lbs. Her daughter reports she has been increasingly fatigues. She denies fevers and night sweats. She reports a history of acid reflux. She denies trouble swallowing and new pains. She has had previous colonoscopy and endoscopy the last of which was on 11/17/2018. She has tingling/numbness in her legs which she reports is due to a herniated disc in her back. She underwent a hysterectomy at 66 years old, but she is unaware of why the procedure was done. She denies previous history of cancer, CVA, and MI. She reports a previous TIA. She denies hematuria and hematochezia.   She lives with her daughter-in-law and son. She has been on disability for IBS since 2001; previously she worked in Engineer, manufacturing. She denies chemical exposure. She currently smokes 1/3 ppd, and she has smoked for 29 years. Her sister had breast cancer, her father had colon cancer, a brother had throat cancer, a paternal aunt had bone cancer, and a paternal aunt had liver cancer.  MEDICAL HISTORY:  Past Medical History:  Diagnosis Date   Abdominal aortic stenosis    Anal fissure    Anxiety    Asthma    Complication of anesthesia    COPD (chronic obstructive pulmonary disease) ([place])    Depression    grief   Diabetes mellitus    Diverticulitis    GERD  (gastroesophageal reflux disease)    Glaucoma    Gout    Hypertension    IBS (irritable bowel syndrome)    PONV (postoperative nausea and vomiting)    RA (rheumatoid arthritis) ([place])    Sleep apnea    had one at one time but not anymore   Stroke ([place])    "mini-stroke"- no deficits   TIA (transient ischemic attack)    2010. No deficits    SURGICAL HISTORY: Past Surgical History:  Procedure Laterality Date   ABDOMINAL HYSTERECTOMY  1982   complete   BIOPSY  11/17/2018   Procedure: BIOPSY;  Surgeon: Daneil Dolin, MD;  Location: [facility];  Service: Endoscopy;;   BREAST LUMPECTOMY     right-benign   CATARACT EXTRACTION W/PHACO Right 05/13/2017   Procedure: CATARACT EXTRACTION PHACO AND INTRAOCULAR LENS PLACEMENT ([place]);  Surgeon: Tonny Branch, MD;  Location: [facility];  Service: Ophthalmology;  Laterality: Right;  CDE: 8.59   CATARACT EXTRACTION W/PHACO Left 05/27/2017   Procedure: CATARACT EXTRACTION WITH  PHACOEMULSIFICATION AND INTRAOCULAR LENS PLACEMENT LEFT EYE;  Surgeon: Tonny Branch, MD;  Location: [facility];  Service: Ophthalmology;  Laterality: Left;  CDE: 5.66   CHOLECYSTECTOMY  2003   COLONOSCOPY  12/14/09   Dr. Gala Romney :anal papilla and hemorrhoids,diminutive hperplastic rectal polyps/normal colon   COLONOSCOPY N/A 10/29/2013   [facility]:ALPF papilla and internal hemorrhoids; colonic polyps-removed as described above. I suspect benign anorectal bleeding in the setting of hemorrhoids and  possibly fissure. tubular adenoma. next TCS 10/2020.   COLONOSCOPY WITH PROPOFOL N/A 11/17/2018   Dr. Gala Romney: Nonbleeding internal hemorrhoids, hemorrhoids were moderate and grade.  Exam otherwise normal.  Colonoscopy in 5 years.   ELBOW SURGERY     right   ESOPHAGOGASTRODUODENOSCOPY  12/14/09   Dr. Gala Romney :schatzkis ring 26F, otherwise normal   ESOPHAGOGASTRODUODENOSCOPY N/A 10/29/2013   [facility]: Distal esophageal pseudodiverticulum/Nissen fundoplication   ESOPHAGOGASTRODUODENOSCOPY (EGD) WITH PROPOFOL N/A  11/17/2018   Dr. Gala Romney: Normal esophagus status post dilation.  Nonbleeding erosive gastropathy with benign biopsy.   FINGER SURGERY     on right middle, pointer, and index fingers   FISSURECTOMY     several   INCISIONAL HERNIA REPAIR N/A 03/22/2014   Procedure: [name] HERNIORRHAPHY;  Surgeon: Jamesetta So, MD;  Location: [facility];  Service: General;  Laterality: N/A;   INSERTION OF MESH N/A 03/22/2014   Procedure: INSERTION OF MESH;  Surgeon: Jamesetta So, MD;  Location: [facility];  Service: General;  Laterality: N/A;   MALONEY DILATION N/A 11/17/2018   Procedure: [name] DILATION;  Surgeon: Daneil Dolin, MD;  Location: [facility];  Service: Endoscopy;  Laterality: N/A;   NISSEN FUNDOPLICATION  [id number]   [facility]   WRIST SURGERY     left    SOCIAL HISTORY: Social History   Socioeconomic History   Marital status: Widowed    Spouse name: Not on file   Number of children: Not on file   Years of education: Not on file   Highest education level: Not on file  Occupational History   Occupation: disabled  Tobacco Use   Smoking status: Former    Packs/day: 0.25    Years: 19.00    Pack years: 4.75    Types: Cigarettes    Quit date: 01/09/2007    Years since quitting: 14.4   Smokeless tobacco: Never  Vaping Use   Vaping Use: Never used  Substance and Sexual Activity   Alcohol use: No   Drug use: No   Sexual activity: Never  Other Topics Concern   Not on file  Social History Narrative   1 son-healthy   1 daughter-MVA (drunk-driver)   Social Determinants of Health   Financial Resource Strain: Not on file  Food Insecurity: Not on file  Transportation Needs: Not on file  Physical Activity: Not on file  Stress: Not on file  Social Connections: Not on file  Intimate Partner Violence: Not on file    FAMILY HISTORY: Family History  Problem Relation Age of Onset   Colon cancer Father 64   Hypertension Sister    Hypertension Brother    Kidney disease Brother      ALLERGIES:  is allergic to aspirin, lisinopril, propoxyphene n-acetaminophen, zithromax [azithromycin], and erythromycin.  MEDICATIONS:  Current Outpatient Medications  Medication Sig Dispense Refill   ACTEMRA ACTPEN 162 MG/0.9ML SOAJ Inject into the skin.     albuterol (PROVENTIL HFA;VENTOLIN HFA) 108 (90 BASE) MCG/ACT inhaler 2 puffs as needed for wheezing or shortness of breath. Take 2 puffs twice a day and every six hours as needed for shortness of breath.     albuterol (PROVENTIL) (2.5 MG/3ML) 0.083% nebulizer solution Take 2.5 mg by nebulization as needed for wheezing or shortness of breath.      allopurinol (ZYLOPRIM) 300 MG tablet Take 300 mg by mouth daily.     amLODipine (NORVASC) 5 MG tablet Take 5 mg by mouth every morning.      aspirin  81 MG tablet Take 81 mg by mouth daily.     atorvastatin (LIPITOR) 10 MG tablet Take 10 mg by mouth at bedtime.      budesonide-formoterol (SYMBICORT) 160-4.5 MCG/ACT inhaler Inhale 1 puff into the lungs as needed.      calcium carbonate (OS-CAL) 600 MG TABS Take 600 mg by mouth 2 (two) times daily with a meal.     clopidogrel (PLAVIX) 75 MG tablet Take 75 mg by mouth daily.     cycloSPORINE (RESTASIS) 0.05 % ophthalmic emulsion Place 1 drop into both eyes 2 (two) times daily.     dexlansoprazole (DEXILANT) 60 MG capsule Take 1 capsule (60 mg total) by mouth daily. 90 capsule 3   doxepin (SINEQUAN) 75 MG capsule Take 75 mg by mouth at bedtime.     glipiZIDE (GLUCOTROL) 10 MG tablet Take 10 mg by mouth 2 (two) times daily before a meal.     hyoscyamine (LEVSIN SL) 0.125 MG SL tablet Place 1 tablet (0.125 mg total) under the tongue every 6 (six) hours as needed for cramping. 90 tablet 3   ibuprofen (ADVIL,MOTRIN) 200 MG tablet Take 400 mg by mouth as needed for headache or moderate pain.      insulin detemir (LEVEMIR) 100 UNIT/ML injection Inject 40 Units into the skin daily.     lubiprostone (AMITIZA) 8 MCG capsule TAKE 1 CAPSULE BY MOUTH  ONCE  DAILY WITH BREAKFAST 90 capsule 3   metoprolol (TOPROL-XL) 100 MG 24 hr tablet Take 100 mg by mouth every morning.     nitroGLYCERIN (NITROGLYN) 2 % ointment Apply 1 inch topically every 8 (eight) hours as needed (hemorrhoids).     OZEMPIC, 0.25 OR 0.5 MG/DOSE, 2 MG/1.5ML SOPN Inject 0.5 mg into the skin once a week.     No current facility-administered medications for this visit.    REVIEW OF SYSTEMS:   Review of Systems  Constitutional:  Positive for fatigue. Negative for appetite change, fever and unexpected weight change (-26 lbs).  HENT:   Positive for lump/mass (L side of neck). Negative for trouble swallowing.   Gastrointestinal:  Positive for nausea. Negative for blood in stool.  Endocrine: Negative for hot flashes.  Genitourinary:  Negative for hematuria.   Neurological:  Positive for numbness.  Psychiatric/Behavioral:  Positive for sleep disturbance.   All other systems reviewed and are negative.   PHYSICAL EXAMINATION: ECOG PERFORMANCE STATUS: 1 - Symptomatic but completely ambulatory  Vitals:   06/07/21 1320  BP: 135/76  Pulse: 93  Resp: 18  Temp: 98.2 F (36.8 C)  SpO2: 96%   Filed Weights   06/07/21 1320  Weight: 176 lb 4.8 oz (80 kg)   Physical Exam Vitals reviewed.  Constitutional:      Appearance: Normal appearance. She is obese.  Cardiovascular:     Rate and Rhythm: Normal rate and regular rhythm.     Pulses: Normal pulses.     Heart sounds: Normal heart sounds.  Pulmonary:     Effort: Pulmonary effort is normal.     Breath sounds: Normal breath sounds.  Abdominal:     Palpations: Abdomen is soft. There is no hepatomegaly, splenomegaly or mass.     Tenderness: There is no abdominal tenderness.  Musculoskeletal:     Right lower leg: No edema.     Left lower leg: No edema.  Lymphadenopathy:     Cervical: No cervical adenopathy.     Right cervical: No superficial cervical adenopathy.    Left cervical:  No superficial cervical adenopathy.      Upper Body:     Right upper body: No supraclavicular or axillary adenopathy.     Left upper body: Supraclavicular adenopathy (2 cm) present. No axillary adenopathy.     Lower Body: No right inguinal adenopathy. No left inguinal adenopathy.  Neurological:     General: No focal deficit present.     Mental Status: She is alert and oriented to person, place, and time.  Psychiatric:        Mood and Affect: Mood normal.        Behavior: Behavior normal.     LABORATORY DATA:  I have reviewed the data as listed    Latest Ref Rng & Units 03/21/2018    5:24 AM 03/20/2018    5:33 AM 03/19/2018    5:04 PM  CBC  WBC 4.0 - 10.5 K/uL 9.5   8.8   11.6    Hemoglobin 12.0 - 15.0 g/dL 12.1   10.8   13.5    Hematocrit 36.0 - 46.0 % 39.2   34.4   43.1    Platelets 150 - 400 K/uL 249   195   274        Latest Ref Rng & Units 05/24/2021   12:37 PM 03/21/2018    5:24 AM 03/20/2018    5:33 AM  CMP  Glucose 70 - 99 mg/dL  279   142    BUN 8 - 23 mg/dL  11   8    Creatinine 0.44 - 1.00 mg/dL 0.90   0.67   0.58    Sodium 135 - 145 mmol/L  138   140    Potassium 3.5 - 5.1 mmol/L  4.7   3.6    Chloride 98 - 111 mmol/L  111   114    CO2 22 - 32 mmol/L  21   22    Calcium 8.9 - 10.3 mg/dL  7.8   7.4      RADIOGRAPHIC STUDIES: I have personally reviewed the radiological images as listed and agreed with the findings in the report. CT SOFT TISSUE NECK W CONTRAST  Result Date: 05/26/2021 CLINICAL DATA:  Left-sided neck swelling.  Rule out adenopathy EXAM: CT NECK WITH CONTRAST TECHNIQUE: Multidetector CT imaging of the neck was performed using the standard protocol following the bolus administration of intravenous contrast. RADIATION DOSE REDUCTION: This exam was performed according to the departmental dose-optimization program which includes automated exposure control, adjustment of the mA and/or kV according to patient size and/or use of iterative reconstruction technique. CONTRAST:  183mL OMNIPAQUE IOHEXOL  300 MG/ML  SOLN COMPARISON:  CT angio neck 08/04/2008 FINDINGS: Pharynx and larynx: Normal. No mass or swelling. Salivary glands: No inflammation, mass, or stone. Thyroid: Negative Lymph nodes: No enlarged lymph nodes in the upper neck. In the lower neck, there is a 11 mm lymph node posterior to the right clavicle. Posterior to the left clavicle there is a 13.5 mm lymph node and a 10 mm lymph node. Lateral to the thyroid on the left there is a 12 mm lymph node, and 8 mm lymph node. Vascular: Normal vascular enhancement. Limited intracranial: Negative Visualized orbits: No orbital lesion.  Bilateral cataract extraction Mastoids and visualized paranasal sinuses: Paranasal sinuses clear. Mastoid clear. Skeleton: No acute skeletal abnormality. Upper chest: Chest CT from today reported separately Other: None IMPRESSION: Cluster of mildly prominent lymph nodes in the lower neck bilaterally left greater than right. These have developed since the  prior CT of 2010. These could be reactive lymph nodes or related to malignancy. Correlate with chest CT today reported separately. Electronically Signed   By: Franchot Gallo M.D.   On: 05/26/2021 14:33   CT CHEST ABDOMEN PELVIS W CONTRAST  Result Date: 05/27/2021 CLINICAL DATA:  Left neck swelling, possible adenopathy. EXAM: CT CHEST, ABDOMEN, AND PELVIS WITH CONTRAST TECHNIQUE: Multidetector CT imaging of the chest, abdomen and pelvis was performed following the standard protocol during bolus administration of intravenous contrast. RADIATION DOSE REDUCTION: This exam was performed according to the departmental dose-optimization program which includes automated exposure control, adjustment of the mA and/or kV according to patient size and/or use of iterative reconstruction technique. CONTRAST:  183mL OMNIPAQUE IOHEXOL 300 MG/ML  SOLN COMPARISON:  CT abdomen and pelvis 10/01/2010 FINDINGS: CT CHEST FINDINGS Cardiovascular: Coronary arcs crash that diffuse coronary artery  calcifications and moderate aortic calcifications. Heart is normal size. Aorta normal caliber. Mediastinum/Nodes: Mediastinal adenopathy. Largest right paratracheal lymph node has a short axis diameter of 2 cm. Subcarinal lymph node has a short axis diameter of 13 mm. Left hilar adenopathy with short axis diameter of 15 mm. No right hilar or axillary adenopathy. Trachea and esophagus are unremarkable. Thyroid unremarkable. Lungs/Pleura: Areas of peripheral interstitial thickening compatible with scarring/early fibrosis. No confluent airspace opacities. No suspicious pulmonary nodules. No effusions. Mild centrilobular emphysema. Musculoskeletal: Chest wall soft tissues are unremarkable. No acute bony abnormality or suspicious focal abnormality. CT ABDOMEN PELVIS FINDINGS Hepatobiliary: Numerous low-density lesions throughout the liver most compatible with metastases. The largest is in the left hepatic lobe measuring 4.1 cm. Index right hepatic lesion measures up to 2.8 cm. Several other similarly sized and smaller lesions. Prior cholecystectomy. Pancreas: No focal abnormality or ductal dilatation. Spleen: No focal abnormality.  Normal size. Adrenals/Urinary Tract: No adrenal abnormality. No focal renal abnormality. No stones or hydronephrosis. Urinary bladder is unremarkable. Stomach/Bowel: Normal appendix. Stomach, large and small bowel grossly unremarkable. Vascular/Lymphatic: Heavily calcified aorta and iliac vessels. No evidence of aneurysm or adenopathy. Reproductive: Prior hysterectomy.  No adnexal masses. Other: No free fluid or free air. Musculoskeletal: No acute bony abnormality. Degenerative disc and facet disease in the lumbar spine. IMPRESSION: Mediastinal and left hilar adenopathy concerning for metastatic disease. Numerous enhancing low-density lesions throughout the liver measuring up to 4.1 cm concerning for metastases. Primary malignancy not readily apparent. Coronary artery disease, aortic  atherosclerosis. Emphysema. These results will be called to the ordering clinician or representative by the Radiologist Assistant, and communication documented in the PACS or [employer]. Electronically Signed   By: Rolm Baptise M.D.   On: 05/27/2021 10:02    ASSESSMENT:  Liver masses/mediastinal and left supraclavicular adenopathy: - She has felt left supraclavicular lymph node since November 2022. - She reported 26 pound weight loss in the last 6 to 8 months.  She reports she has been on Ozempic for the last 8 months.  Denies any fevers or night sweats although reports severe tiredness which is worsening.  No new pains. - She had a hysterectomy at age 55 due to cancer.  Unclear uterine or cervical.  Did not require any further therapies. - EGD/colonoscopy on 11/17/2018: Nonbleeding internal hemorrhoids, normal colon, normal esophagus, nonbleeding erosive gastropathy, normal duodenum. - CT soft tissue neck (05/24/2021): Cluster of mildly prominent lymph nodes in the lower neck bilaterally left greater than right. - CT CAP (05/24/2021): Mediastinal and left hilar adenopathy.  Numerous enhancing low-density lesions throughout the liver measuring up to 4.1 cm.  Social/family history: - She lives at home with her son and daughter-in-law.  She has been on disability secondary to IBS since 2001.  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  She is a current active smoker, 1/3 pack/day for the last 30 years.   PLAN:  Liver masses/mediastinal and left supraclavicular adenopathy: - I have reviewed images of the CT neck, CAP with the patient and her daughter-in-law in detail. - We discussed the differential diagnosis in detail. - Recommend PET CT scan and further work-up with tumor markers and LDH. - Left supraclavicular lymph node is easily accessible.  Would recommend US guided biopsy. - RTC after biopsy.   All questions were answered. The patient knows to call  the clinic with any problems, questions or concerns.   Derek Jack, MD, 06/07/21 2:11 PM  [place] [phone]   I, Thana Ates, am acting as a scribe for Dr. Derek Jack.  I, Derek Jack MD, have reviewed the above documentation for accuracy and completeness, and I agree with the above.

## 2021-06-08 ENCOUNTER — Encounter (HOSPITAL_COMMUNITY): Payer: Self-pay

## 2021-06-08 LAB — CANCER ANTIGEN 19-9: CA 19-9: 73 U/mL — ABNORMAL HIGH (ref 0–35)

## 2021-06-08 LAB — AFP TUMOR MARKER: AFP, Serum, Tumor Marker: 4.2 ng/mL (ref 0.0–9.2)

## 2021-06-08 LAB — CA 125: Cancer Antigen (CA) 125: 30.5 U/mL (ref 0.0–38.1)

## 2021-06-08 LAB — CEA: CEA: 24.3 ng/mL — ABNORMAL HIGH (ref 0.0–4.7)

## 2021-06-08 NOTE — Progress Notes (Signed)
I met with the patient and her family during and following initial visit with Dr. Delton Coombes. Provided my contact information and encouraged patient and family to call with questions or concerns. ?

## 2021-06-14 DIAGNOSIS — J449 Chronic obstructive pulmonary disease, unspecified: Secondary | ICD-10-CM | POA: Diagnosis not present

## 2021-06-14 DIAGNOSIS — G894 Chronic pain syndrome: Secondary | ICD-10-CM | POA: Diagnosis not present

## 2021-06-14 DIAGNOSIS — I69398 Other sequelae of cerebral infarction: Secondary | ICD-10-CM | POA: Diagnosis not present

## 2021-06-14 DIAGNOSIS — M5416 Radiculopathy, lumbar region: Secondary | ICD-10-CM | POA: Diagnosis not present

## 2021-06-14 DIAGNOSIS — M05659 Rheumatoid arthritis of unspecified hip with involvement of other organs and systems: Secondary | ICD-10-CM | POA: Diagnosis not present

## 2021-06-21 ENCOUNTER — Other Ambulatory Visit: Payer: Self-pay | Admitting: Student

## 2021-06-21 DIAGNOSIS — Z79899 Other long term (current) drug therapy: Secondary | ICD-10-CM | POA: Diagnosis not present

## 2021-06-21 DIAGNOSIS — M109 Gout, unspecified: Secondary | ICD-10-CM | POA: Diagnosis not present

## 2021-06-21 DIAGNOSIS — M0579 Rheumatoid arthritis with rheumatoid factor of multiple sites without organ or systems involvement: Secondary | ICD-10-CM | POA: Diagnosis not present

## 2021-06-21 DIAGNOSIS — Z13820 Encounter for screening for osteoporosis: Secondary | ICD-10-CM | POA: Diagnosis not present

## 2021-06-22 ENCOUNTER — Encounter (HOSPITAL_COMMUNITY)
Admission: RE | Admit: 2021-06-22 | Discharge: 2021-06-22 | Disposition: A | Discharge status: Home / Self Care | Payer: Medicare Other | Source: Ambulatory Visit | Attending: Hematology | Admitting: Hematology

## 2021-06-22 ENCOUNTER — Other Ambulatory Visit: Payer: Self-pay

## 2021-06-22 ENCOUNTER — Ambulatory Visit (HOSPITAL_COMMUNITY)
Admission: RE | Admit: 2021-06-22 | Discharge: 2021-06-22 | Disposition: A | Discharge status: Home / Self Care | Payer: Medicare Other | Source: Ambulatory Visit | Attending: Hematology | Admitting: Hematology

## 2021-06-22 DIAGNOSIS — K769 Liver disease, unspecified: Secondary | ICD-10-CM

## 2021-06-22 DIAGNOSIS — K589 Irritable bowel syndrome without diarrhea: Secondary | ICD-10-CM | POA: Diagnosis not present

## 2021-06-22 DIAGNOSIS — R590 Localized enlarged lymph nodes: Secondary | ICD-10-CM

## 2021-06-22 DIAGNOSIS — R160 Hepatomegaly, not elsewhere classified: Secondary | ICD-10-CM

## 2021-06-22 DIAGNOSIS — I10 Essential (primary) hypertension: Secondary | ICD-10-CM | POA: Insufficient documentation

## 2021-06-22 DIAGNOSIS — J439 Emphysema, unspecified: Secondary | ICD-10-CM | POA: Diagnosis not present

## 2021-06-22 DIAGNOSIS — E119 Type 2 diabetes mellitus without complications: Secondary | ICD-10-CM | POA: Diagnosis not present

## 2021-06-22 DIAGNOSIS — Z8673 Personal history of transient ischemic attack (TIA), and cerebral infarction without residual deficits: Secondary | ICD-10-CM | POA: Diagnosis not present

## 2021-06-22 DIAGNOSIS — I700 Atherosclerosis of aorta: Secondary | ICD-10-CM | POA: Insufficient documentation

## 2021-06-22 DIAGNOSIS — R918 Other nonspecific abnormal finding of lung field: Secondary | ICD-10-CM | POA: Diagnosis not present

## 2021-06-22 DIAGNOSIS — K7290 Hepatic failure, unspecified without coma: Secondary | ICD-10-CM | POA: Diagnosis not present

## 2021-06-22 DIAGNOSIS — I2510 Atherosclerotic heart disease of native coronary artery without angina pectoris: Secondary | ICD-10-CM | POA: Diagnosis not present

## 2021-06-22 DIAGNOSIS — C229 Malignant neoplasm of liver, not specified as primary or secondary: Secondary | ICD-10-CM | POA: Diagnosis not present

## 2021-06-22 DIAGNOSIS — C787 Secondary malignant neoplasm of liver and intrahepatic bile duct: Secondary | ICD-10-CM | POA: Diagnosis not present

## 2021-06-22 LAB — CBC
HCT: 41.9 % (ref 36.0–46.0)
Hemoglobin: 13.5 g/dL (ref 12.0–15.0)
MCH: 29.7 pg (ref 26.0–34.0)
MCHC: 32.2 g/dL (ref 30.0–36.0)
MCV: 92.3 fL (ref 80.0–100.0)
Platelets: 184 10*3/uL (ref 150–400)
RBC: 4.54 MIL/uL (ref 3.87–5.11)
RDW: 14.7 % (ref 11.5–15.5)
WBC: 5.6 10*3/uL (ref 4.0–10.5)
nRBC: 0 % (ref 0.0–0.2)

## 2021-06-22 LAB — PROTIME-INR
INR: 1 (ref 0.8–1.2)
Prothrombin Time: 13.5 seconds (ref 11.4–15.2)

## 2021-06-22 LAB — GLUCOSE, CAPILLARY
Glucose-Capillary: 54 mg/dL — ABNORMAL LOW (ref 70–99)
Glucose-Capillary: 97 mg/dL (ref 70–99)

## 2021-06-22 MED ORDER — MIDAZOLAM HCL 2 MG/2ML IJ SOLN
INTRAMUSCULAR | Status: AC
  Filled 2021-06-22: qty 2

## 2021-06-22 MED ORDER — FENTANYL CITRATE (PF) 100 MCG/2ML IJ SOLN
INTRAMUSCULAR | Status: AC
  Filled 2021-06-22: qty 2

## 2021-06-22 MED ORDER — FLUDEOXYGLUCOSE F - 18 (FDG) INJECTION
9.5700 | Freq: Once | INTRAVENOUS | Status: AC | PRN
  Administered 2021-06-22: 9.57 via INTRAVENOUS

## 2021-06-22 MED ORDER — LIDOCAINE HCL (PF) 1 % IJ SOLN
INTRAMUSCULAR | Status: AC
  Filled 2021-06-22: qty 30

## 2021-06-22 MED ORDER — FENTANYL CITRATE (PF) 100 MCG/2ML IJ SOLN
INTRAMUSCULAR | Status: AC | PRN
Start: 2021-06-22 — End: 2021-06-22
  Administered 2021-06-22: 25 ug via INTRAVENOUS

## 2021-06-22 MED ORDER — MIDAZOLAM HCL 2 MG/2ML IJ SOLN
INTRAMUSCULAR | Status: AC | PRN
  Administered 2021-06-22: 1 mg via INTRAVENOUS

## 2021-06-22 MED ORDER — SODIUM CHLORIDE 0.9 % IV SOLN: INTRAVENOUS | Status: DC

## 2021-06-22 MED ORDER — DEXTROSE 50 % IV SOLN
INTRAVENOUS | Status: AC
  Administered 2021-06-22: 25 mL via INTRAVENOUS
  Filled 2021-06-22: qty 50

## 2021-06-22 MED ORDER — DEXTROSE 50 % IV SOLN: 25.0000 mL | Freq: Once | INTRAVENOUS | Status: AC

## 2021-06-22 MED ORDER — GELATIN ABSORBABLE 12-7 MM EX MISC
CUTANEOUS | Status: AC
  Filled 2021-06-22: qty 1

## 2021-06-22 NOTE — Progress Notes (Signed)
CBG was 54 upon arrival to [facility]. 25 ml of D50 given. Recheck CBG was 97. ?

## 2021-06-22 NOTE — H&P (Signed)
Chief Complaint: Patient was seen in consultation today for image guided liver lesion biopsy at the request of [place]  Referring Physician(s): Katragadda,Sreedhar  Supervising Physician: Corrie Mckusick  Patient Status: [facility] - Out-pt  History of Present Illness: [facility] is a 66 y.o. female with PMHs of HTN, TIA on 2010, DM, COPD, IBS, and recent finding of liver lesions.   Patient noticed swelling on her neck and underwent CT soft tissue neck on 05/26/21 which showed LANs concerning for malignancy. Subsequent CT CAP with on 05/27/21 showed:   Mediastinal and left hilar adenopathy concerning for metastatic disease.   Numerous enhancing low-density lesions throughout the liver measuring up to 4.1 cm concerning for metastases.   Primary malignancy not readily apparent.   Coronary artery disease, aortic atherosclerosis.   Emphysema.  Patient was referred to hem/onc for further evaluation, and lover lesion biopsy was recommended. After thorough discussion and shared decision making, patient decided to proceed with the biopsy.  IR was requested for image guided liver lesion biopsy.  Patient laying in bed, not in acute distress.  Denise headache, fever, chills, shortness of breath, cough, chest pain, abdominal pain, nausea ,vomiting, and bleeding.   Past Medical History:  Diagnosis Date   Abdominal aortic stenosis    Anal fissure    Anxiety    Asthma    Complication of anesthesia    COPD (chronic obstructive pulmonary disease) (HCC)    Depression    grief   Diabetes mellitus    Diverticulitis    GERD (gastroesophageal reflux disease)    Glaucoma    Gout    Hypertension    IBS (irritable bowel syndrome)    PONV (postoperative nausea and vomiting)    RA (rheumatoid arthritis) ([place])    Sleep apnea    had one at one time but not anymore   Stroke ([place])    "mini-stroke"- no deficits   TIA (transient ischemic attack)    2010. No deficits    Past  Surgical History:  Procedure Laterality Date   ABDOMINAL HYSTERECTOMY  1982   complete   BIOPSY  11/17/2018   Procedure: BIOPSY;  Surgeon: Daneil Dolin, MD;  Location: [facility];  Service: Endoscopy;;   BREAST LUMPECTOMY     right-benign   CATARACT EXTRACTION W/PHACO Right 05/13/2017   Procedure: CATARACT EXTRACTION PHACO AND INTRAOCULAR LENS PLACEMENT ([place]);  Surgeon: Tonny Branch, MD;  Location: [facility] ORS;  Service: Ophthalmology;  Laterality: Right;  CDE: 8.59   CATARACT EXTRACTION W/PHACO Left 05/27/2017   Procedure: CATARACT EXTRACTION WITH  PHACOEMULSIFICATION AND INTRAOCULAR LENS PLACEMENT LEFT EYE;  Surgeon: Tonny Branch, MD;  Location: [facility] ORS;  Service: Ophthalmology;  Laterality: Left;  CDE: 5.66   CHOLECYSTECTOMY  2003   COLONOSCOPY  12/14/09   Dr. Gala Romney :anal papilla and hemorrhoids,diminutive hperplastic rectal polyps/normal colon   COLONOSCOPY N/A 10/29/2013   WUJ:WJXB papilla and internal hemorrhoids; colonic polyps-removed as described above. I suspect benign anorectal bleeding in the setting of hemorrhoids and  possibly fissure. tubular adenoma. next TCS 10/2020.   COLONOSCOPY WITH PROPOFOL N/A 11/17/2018   Dr. Gala Romney: Nonbleeding internal hemorrhoids, hemorrhoids were moderate and grade.  Exam otherwise normal.  Colonoscopy in 5 years.   ELBOW SURGERY     right   ESOPHAGOGASTRODUODENOSCOPY  12/14/09   Dr. Gala Romney :schatzkis ring 72F, otherwise normal   ESOPHAGOGASTRODUODENOSCOPY N/A 10/29/2013   RMR: Distal esophageal pseudodiverticulum/Nissen fundoplication   ESOPHAGOGASTRODUODENOSCOPY (EGD) WITH PROPOFOL N/A 11/17/2018   Dr. Gala Romney: Normal  esophagus status post dilation.  Nonbleeding erosive gastropathy with benign biopsy.   FINGER SURGERY     on right middle, pointer, and index fingers   FISSURECTOMY     several   INCISIONAL HERNIA REPAIR N/A 03/22/2014   Procedure: [name] HERNIORRHAPHY;  Surgeon: Jamesetta So, MD;  Location: [facility];  Service: General;  Laterality:  N/A;   INSERTION OF MESH N/A 03/22/2014   Procedure: INSERTION OF MESH;  Surgeon: Jamesetta So, MD;  Location: [facility];  Service: General;  Laterality: N/A;   MALONEY DILATION N/A 11/17/2018   Procedure: [name] DILATION;  Surgeon: Daneil Dolin, MD;  Location: [facility];  Service: Endoscopy;  Laterality: N/A;   NISSEN FUNDOPLICATION  1025   [place]   WRIST SURGERY     left    Allergies: Lisinopril, Aspirin, Propoxyphene n-acetaminophen, Zithromax [azithromycin], and Erythromycin  Medications: Prior to Admission medications   Medication Sig Start Date End Date Taking? Authorizing Provider  ACTEMRA ACTPEN 162 MG/0.9ML SOAJ Inject 162 mg into the skin every Thursday. 06/01/21  Yes [provider]  albuterol (PROVENTIL HFA;VENTOLIN HFA) 108 (90 BASE) MCG/ACT inhaler Inhale 2 puffs into the lungs every 6 (six) hours as needed for wheezing or shortness of breath.   Yes [provider]  albuterol (PROVENTIL) (2.5 MG/3ML) 0.083% nebulizer solution Take 2.5 mg by nebulization every 6 (six) hours as needed for wheezing or shortness of breath.   Yes [provider]  allopurinol (ZYLOPRIM) 300 MG tablet Take 300 mg by mouth daily.   Yes [provider]  amLODipine (NORVASC) 5 MG tablet Take 5 mg by mouth every morning.  03/08/18  Yes [provider]  aspirin 81 MG tablet Take 81 mg by mouth daily.   Yes [provider]  atorvastatin (LIPITOR) 10 MG tablet Take 10 mg by mouth at bedtime.    Yes [provider]  budesonide-formoterol (SYMBICORT) 160-4.5 MCG/ACT inhaler Inhale 2 puffs into the lungs 2 (two) times daily as needed.   Yes [provider]  calcium carbonate (OS-CAL) 600 MG TABS Take 600 mg by mouth 2 (two) times daily with a meal.   Yes [provider]  cycloSPORINE (RESTASIS) 0.05 % ophthalmic emulsion Place 1 drop into both eyes 2 (two) times daily.   Yes [provider]  diphenhydrAMINE  (BENADRYL) 25 MG tablet Take 25 mg by mouth daily as needed for allergies.   Yes [provider]  doxepin (SINEQUAN) 75 MG capsule Take 75 mg by mouth at bedtime.   Yes [provider]  esomeprazole (NEXIUM) 40 MG capsule Take 40 mg by mouth See admin instructions. Take 40 mg daily, may take a second 40 mg capsule as needed for heartburn   Yes [provider]  glipiZIDE (GLUCOTROL XL) 10 MG 24 hr tablet Take 10 mg by mouth 2 (two) times daily.   Yes [provider]  hyoscyamine (LEVSIN SL) 0.125 MG SL tablet Place 1 tablet (0.125 mg total) under the tongue every 6 (six) hours as needed for cramping. 05/18/20  Yes Carlis Stable, NP  ibuprofen (ADVIL,MOTRIN) 200 MG tablet Take 200-800 mg by mouth every 8 (eight) hours as needed for moderate pain.   Yes [provider]  insulin detemir (LEVEMIR) 100 UNIT/ML injection Inject 10 Units into the skin daily with lunch.   Yes [provider]  lubiprostone (AMITIZA) 8 MCG capsule TAKE 1 CAPSULE BY MOUTH  ONCE DAILY WITH BREAKFAST Patient taking differently:  Take 8 mcg by mouth daily as needed for constipation. 08/27/19  Yes Erenest Rasher, PA-C  metoprolol (TOPROL-XL) 100 MG 24 hr tablet Take 100 mg by mouth every morning.   Yes [provider]  clopidogrel (PLAVIX) 75 MG tablet Take 75 mg by mouth daily.    [provider]  nitroGLYCERIN (NITROGLYN) 2 % ointment Apply 1 inch topically every 8 (eight) hours as needed (hemorrhoids).    [provider]     Family History  Problem Relation Age of Onset   Colon cancer Father 16   Hypertension Sister    Hypertension Brother    Kidney disease Brother     Social History   Socioeconomic History   Marital status: Widowed    Spouse name: Not on file   Number of children: Not on file   Years of education: Not on file   Highest education level: Not on file  Occupational History   Occupation: disabled  Tobacco Use   Smoking  status: Former    Packs/day: 0.25    Years: 19.00    Pack years: 4.75    Types: Cigarettes    Quit date: 01/09/2007    Years since quitting: 14.4   Smokeless tobacco: Never  Vaping Use   Vaping Use: Never used  Substance and Sexual Activity   Alcohol use: No   Drug use: No   Sexual activity: Never  Other Topics Concern   Not on file  Social History Narrative   1 son-healthy   1 daughter-MVA (drunk-driver)   Social Determinants of Health   Financial Resource Strain: Not on file  Food Insecurity: Not on file  Transportation Needs: Not on file  Physical Activity: Not on file  Stress: Not on file  Social Connections: Not on file     Review of Systems: A 12 point ROS discussed and pertinent positives are indicated in the HPI above.  All other systems are negative.  Vital Signs: BP (!) 151/84   Pulse 92   Temp 98.1 F (36.7 C) (Oral)   Resp 17   Ht 5\' 1"  (1.549 m)   Wt 186 lb (84.4 kg)   SpO2 97%   BMI 35.14 kg/m    Physical Exam Vitals reviewed.  Constitutional:      General: She is not in acute distress.    Appearance: Normal appearance. She is not ill-appearing.  HENT:     Head: Normocephalic and atraumatic.     Mouth/Throat:     Mouth: Mucous membranes are moist.  Cardiovascular:     Rate and Rhythm: Normal rate and regular rhythm.     Heart sounds: Normal heart sounds.  Pulmonary:     Effort: Pulmonary effort is normal.     Breath sounds: Wheezing present.     Comments: Wheezing on LLL  Abdominal:     General: Abdomen is flat. Bowel sounds are normal.     Palpations: Abdomen is soft.  Musculoskeletal:     Cervical back: Neck supple.  Skin:    General: Skin is warm and dry.     Coloration: Skin is not jaundiced or pale.  Neurological:     Mental Status: She is alert and oriented to person, place, and time.  Psychiatric:        Mood and Affect: Mood normal.        Behavior: Behavior normal.        Judgment: Judgment normal.    MD  Evaluation Airway: WNL Heart:  WNL Abdomen: WNL Chest/ Lungs: WNL ASA  Classification: 3 Mallampati/Airway Score: Two  Imaging: CT SOFT TISSUE NECK W CONTRAST  Result Date: 05/26/2021 CLINICAL DATA:  Left-sided neck swelling.  Rule out adenopathy EXAM: CT NECK WITH CONTRAST TECHNIQUE: Multidetector CT imaging of the neck was performed using the standard protocol following the bolus administration of intravenous contrast. RADIATION DOSE REDUCTION: This exam was performed according to the departmental dose-optimization program which includes automated exposure control, adjustment of the mA and/or kV according to patient size and/or use of iterative reconstruction technique. CONTRAST:  191mL OMNIPAQUE IOHEXOL 300 MG/ML  SOLN COMPARISON:  CT angio neck 08/04/2008 FINDINGS: Pharynx and larynx: Normal. No mass or swelling. Salivary glands: No inflammation, mass, or stone. Thyroid: Negative Lymph nodes: No enlarged lymph nodes in the upper neck. In the lower neck, there is a 11 mm lymph node posterior to the right clavicle. Posterior to the left clavicle there is a 13.5 mm lymph node and a 10 mm lymph node. Lateral to the thyroid on the left there is a 12 mm lymph node, and 8 mm lymph node. Vascular: Normal vascular enhancement. Limited intracranial: Negative Visualized orbits: No orbital lesion.  Bilateral cataract extraction Mastoids and visualized paranasal sinuses: Paranasal sinuses clear. Mastoid clear. Skeleton: No acute skeletal abnormality. Upper chest: Chest CT from today reported separately Other: None IMPRESSION: Cluster of mildly prominent lymph nodes in the lower neck bilaterally left greater than right. These have developed since the prior CT of 2010. These could be reactive lymph nodes or related to malignancy. Correlate with chest CT today reported separately. Electronically Signed   By: Franchot Gallo M.D.   On: 05/26/2021 14:33   CT CHEST ABDOMEN PELVIS W CONTRAST  Result Date:  05/27/2021 CLINICAL DATA:  Left neck swelling, possible adenopathy. EXAM: CT CHEST, ABDOMEN, AND PELVIS WITH CONTRAST TECHNIQUE: Multidetector CT imaging of the chest, abdomen and pelvis was performed following the standard protocol during bolus administration of intravenous contrast. RADIATION DOSE REDUCTION: This exam was performed according to the departmental dose-optimization program which includes automated exposure control, adjustment of the mA and/or kV according to patient size and/or use of iterative reconstruction technique. CONTRAST:  149mL OMNIPAQUE IOHEXOL 300 MG/ML  SOLN COMPARISON:  CT abdomen and pelvis 10/01/2010 FINDINGS: CT CHEST FINDINGS Cardiovascular: Coronary arcs crash that diffuse coronary artery calcifications and moderate aortic calcifications. Heart is normal size. Aorta normal caliber. Mediastinum/Nodes: Mediastinal adenopathy. Largest right paratracheal lymph node has a short axis diameter of 2 cm. Subcarinal lymph node has a short axis diameter of 13 mm. Left hilar adenopathy with short axis diameter of 15 mm. No right hilar or axillary adenopathy. Trachea and esophagus are unremarkable. Thyroid unremarkable. Lungs/Pleura: Areas of peripheral interstitial thickening compatible with scarring/early fibrosis. No confluent airspace opacities. No suspicious pulmonary nodules. No effusions. Mild centrilobular emphysema. Musculoskeletal: Chest wall soft tissues are unremarkable. No acute bony abnormality or suspicious focal abnormality. CT ABDOMEN PELVIS FINDINGS Hepatobiliary: Numerous low-density lesions throughout the liver most compatible with metastases. The largest is in the left hepatic lobe measuring 4.1 cm. Index right hepatic lesion measures up to 2.8 cm. Several other similarly sized and smaller lesions. Prior cholecystectomy. Pancreas: No focal abnormality or ductal dilatation. Spleen: No focal abnormality.  Normal size. Adrenals/Urinary Tract: No adrenal abnormality. No focal  renal abnormality. No stones or hydronephrosis. Urinary bladder is unremarkable. Stomach/Bowel: Normal appendix. Stomach, large and small bowel grossly unremarkable. Vascular/Lymphatic: Heavily calcified aorta and iliac vessels. No evidence of aneurysm or adenopathy. Reproductive:  Prior hysterectomy.  No adnexal masses. Other: No free fluid or free air. Musculoskeletal: No acute bony abnormality. Degenerative disc and facet disease in the lumbar spine. IMPRESSION: Mediastinal and left hilar adenopathy concerning for metastatic disease. Numerous enhancing low-density lesions throughout the liver measuring up to 4.1 cm concerning for metastases. Primary malignancy not readily apparent. Coronary artery disease, aortic atherosclerosis. Emphysema. These results will be called to the ordering clinician or representative by the Radiologist Assistant, and communication documented in the PACS or [employer]. Electronically Signed   By: Rolm Baptise M.D.   On: 05/27/2021 10:02    Labs:  CBC: Recent Labs    06/07/21 1419 06/22/21 1056  WBC 5.2 5.6  HGB 15.2* 13.5  HCT 47.4* 41.9  PLT 199 184    COAGS: Recent Labs    06/22/21 1056  INR 1.0    BMP: Recent Labs    05/24/21 1237 06/07/21 1419  NA  --  141  K  --  4.6  CL  --  108  CO2  --  25  GLUCOSE  --  169*  BUN  --  10  CALCIUM  --  9.9  CREATININE 0.90 0.89  GFRNONAA  --  >60    LIVER FUNCTION TESTS: Recent Labs    06/07/21 1419  BILITOT 0.7  AST 46*  ALT 54*  ALKPHOS 55  PROT 7.5  ALBUMIN 3.9    TUMOR MARKERS: No results for input(s): AFPTM, CEA, CA199, CHROMGRNA in the last 8760 hours.  Assessment and Plan: 66 y.o. female with recent finding of liver lesions, who is in need of biopsy for further evaluation and management.   IR was requested for image guided liver lesion biopsy.  Case was reviewed and approved by Dr. Vernard Gambles.   Patient presents to [facility] IR for the procedure.  NPO since MN  VSS CBC all w/in  normal limit  INR 1.0 On Plavix 75 and ASA 81 mg, patient was instructed to hold Plavix 5 days, last Plavix was taken on Friday 4/14, but patient states that was not instructed to hold aspirin, last taken yesterday. Discussed with Dr. Earleen Newport, ok to proceed, patient was notified of slightly higher bleeding risk if proceed. Patient wishes to proceed with the biopsy.   Patient was informed that she can resume her Plavix and aspirin tonight or tomorrow.   Risks and benefits of liver lesion bx was discussed with the patient and/or patient's family including, but not limited to bleeding, infection, damage to adjacent structures or low yield requiring additional tests.  All of the questions were answered and there is agreement to proceed.  Consent signed and in chart.   Thank you for this interesting consult.  I greatly enjoyed meeting PAMELYN BANCROFT and look forward to participating in their care.  A copy of this report was sent to the requesting provider on this date.  Electronically Signed: Tera Mater, PA-C 06/22/2021, 12:05 PM   I spent a total of  30 Minutes   in face to face in clinical consultation, greater than 50% of which was counseling/coordinating care for liver lesion biopsy.  This chart was dictated using voice recognition software.  Despite best efforts to proofread,  errors can occur which can change the documentation meaning.

## 2021-06-22 NOTE — Procedures (Signed)
Interventional Radiology Procedure Note ? ?Procedure: US guided biopsy of left liver mass.   ?Complications: None ?EBL: None ?Recommendations: ?- Bedrest 2 hours.   ?- Routine wound care ?- Follow up pathology ?- Advance diet  ? ?Signed, ? ?Corrie Mckusick, DO ? ? ?

## 2021-06-25 NOTE — Progress Notes (Signed)
Referring Provider: Glenda Chroman, MD Primary Care Physician:  Glenda Chroman, MD Primary GI Physician: Dr. Gala Romney  Chief Complaint  Patient presents with   Follow-up    HPI:   Brittney Martinez is a 66 y.o. female with history of IBS-C, GERD, failed Nissen fundoplication, internal hemorrhoids, and dysphagia.  EGD and colonoscopy on file from September 2020.  EGD with normal esophagus s/p empiric dilation, erosive gastropathy with benign biopsy.  Colonoscopy with nonbleeding internal hemorrhoids, due for repeat in 2025 due to family history of colon cancer. BPE January 2021 with question of partially failed fundoplication wrap, normal esophageal distention, minimal esophageal dysmotility, 12.5 mm barium tablet passed without obstruction. Per prior documentation, patient apparently followed up with her surgeon who stated redoing fundoplication would likely cause more pain.  She is presenting today for follow-up.  Last seen in our office 11/23/2020.  She is having reflux symptoms most every evening with associated nausea few times a week without vomiting on Dexilant daily and Carafate every 2 to 3 days.  Noted when previously taking Carafate every evening, symptoms have improved.  Previously failed Protonix.  Stated Nexium works fairly well previously, but insurance would only pay for once daily.  Taking 400 mg of ibuprofen 3 times a week.  Constipation not adequately managed on Amitiza 8 mcg twice daily and Metamucil one half dose every other day.  Having 1-2 bowel movements daily, but they were like "little rocks".  Also taking Levsin as needed for occasional lower abdominal pain.  Noted 13 pound weight loss over the last 6 months, but patient reported this was intentional through dietary changes and efforts to control her diabetes.  Recommended resuming Carafate nightly, continue Dexilant, limit Levsin, increase Metamucil to full dose daily, continue Amitiza, and call if ongoing constipation symptoms in  2 to 4 weeks.  In the interim, patient underwent CT of her neck due to left-sided neck swelling, found to have cluster of mildly prominent lymph nodes in the lower neck bilaterally, left greater than right.  Also had CT chest/abdomen/pelvis showing mediastinal and left hilar adenopathy concerning for metastatic disease, numerous low-density lesions throughout the liver most compatible with metastasis, primary malignancy not readily apparent.  Labs in early April with CA 19-9 elevated at 73, CEA elevated at 24.3, CA125 and AFP within normal limits.  LFTs mildly elevated with AST 46, ALT 54, hemoglobin and platelets within normal limits, INR within normal limits.  She underwent ultrasound-guided core biopsy of the liver on 4/20 with results pending.  Also had a PET scan on 4/20 with hypermetabolic lymphadenopathy throughout the neck and chest, diffuse hypermetabolic liver metastasis, 9 mm hypermetabolic lesion in the lower pole of left kidney, suspicious for renal cell carcinoma.  She is scheduled for follow-up with Dr. Delton Coombes on 06/29/2021.   Today: Reports she is feeling well overall.  Waiting on her follow-up with Dr. Delton Coombes on 4/27 to discuss treatment options for her metastatic cancer.  GERD symptoms are improved.  She still taking Dexilant daily and Tums as needed a couple times a week.  She is not taking Carafate.  Reports her nocturnal symptoms and nausea has resolved.  No vomiting.  She is limiting spicy foods.  Occasional sensation of food getting hung in her esophagus once or twice a month, but not as bad as it used to be.  Does not feel she needs dilation at this time.    Bowels are moving well, only taking Amitiza once or twice  a week.  Typically having 1-2 soft, formed, productive bowel movements daily without BRBPR or melena.  Reports he started on iron about 8 months ago by her PCP and her stools are dark, but not black.  Reports has not changed much constipation but she is eating more  fruits.  She does continue to take Levsin as needed, typically cramping which occurs when her bowels fluctuate/constipation occurs.   Patient reports her PCP told her she needed an EGD because of her liver.  States Dr. Delton Coombes told her to hold off on EGD as she may not need this.  Gained 7 lbs since I saw her in September 2022.    Past Medical History:  Diagnosis Date   Abdominal aortic stenosis    Anal fissure    Anxiety    Asthma    Complication of anesthesia    COPD (chronic obstructive pulmonary disease) (HCC)    Depression    grief   Diabetes mellitus    Diverticulitis    GERD (gastroesophageal reflux disease)    Glaucoma    Gout    Hypertension    IBS (irritable bowel syndrome)    PONV (postoperative nausea and vomiting)    RA (rheumatoid arthritis) ([place])    Sleep apnea    had one at one time but not anymore   Stroke ([place])    "mini-stroke"- no deficits   TIA (transient ischemic attack)    2010. No deficits    Past Surgical History:  Procedure Laterality Date   ABDOMINAL HYSTERECTOMY  1982   complete   BIOPSY  11/17/2018   Procedure: BIOPSY;  Surgeon: Daneil Dolin, MD;  [facility]: [facility];  Service: Endoscopy;;   BREAST LUMPECTOMY     right-benign   CATARACT EXTRACTION W/PHACO Right 05/13/2017   Procedure: CATARACT EXTRACTION PHACO AND INTRAOCULAR LENS PLACEMENT ([place]);  Surgeon: Tonny Branch, MD;  Location: [facility];  Service: Ophthalmology;  Laterality: Right;  CDE: 8.59   CATARACT EXTRACTION W/PHACO Left 05/27/2017   Procedure: CATARACT EXTRACTION WITH  PHACOEMULSIFICATION AND INTRAOCULAR LENS PLACEMENT LEFT EYE;  Surgeon: Tonny Branch, MD;  Location: [facility];  Service: Ophthalmology;  Laterality: Left;  CDE: 5.66   CHOLECYSTECTOMY  2003   COLONOSCOPY  12/14/09   Dr. Gala Romney :anal papilla and hemorrhoids,diminutive hperplastic rectal polyps/normal colon   COLONOSCOPY N/A 10/29/2013   WGN:FAOZ papilla and internal hemorrhoids; colonic polyps-removed as  described above. I suspect benign anorectal bleeding in the setting of hemorrhoids and  possibly fissure. tubular adenoma. next TCS 10/2020.   COLONOSCOPY WITH PROPOFOL N/A 11/17/2018   Dr. Gala Romney: Nonbleeding internal hemorrhoids, hemorrhoids were moderate and grade.  Exam otherwise normal.  Colonoscopy in 5 years.   ELBOW SURGERY     right   ESOPHAGOGASTRODUODENOSCOPY  12/14/09   Dr. Gala Romney :schatzkis ring 34F, otherwise normal   ESOPHAGOGASTRODUODENOSCOPY N/A 10/29/2013   RMR: Distal esophageal pseudodiverticulum/Nissen fundoplication   ESOPHAGOGASTRODUODENOSCOPY (EGD) WITH PROPOFOL N/A 11/17/2018   Dr. Gala Romney: Normal esophagus status post dilation.  Nonbleeding erosive gastropathy with benign biopsy.   FINGER SURGERY     on right middle, pointer, and index fingers   FISSURECTOMY     several   INCISIONAL HERNIA REPAIR N/A 03/22/2014   Procedure: [name] HERNIORRHAPHY;  Surgeon: Jamesetta So, MD;  Location: [facility];  Service: General;  Laterality: N/A;   INSERTION OF MESH N/A 03/22/2014   Procedure: INSERTION OF MESH;  Surgeon: Jamesetta So, MD;  Location: [facility];  Service: General;  Laterality: N/A;   MALONEY DILATION N/A 11/17/2018   Procedure: [name] DILATION;  Surgeon: Daneil Dolin, MD;  Location: [facility];  Service: Endoscopy;  Laterality: N/A;   NISSEN FUNDOPLICATION  [id number]   [facility]   WRIST SURGERY     left    Current Outpatient Medications  Medication Sig Dispense Refill   albuterol (PROVENTIL HFA;VENTOLIN HFA) 108 (90 BASE) MCG/ACT inhaler Inhale 2 puffs into the lungs every 6 (six) hours as needed for wheezing or shortness of breath.     albuterol (PROVENTIL) (2.5 MG/3ML) 0.083% nebulizer solution Take 2.5 mg by nebulization every 6 (six) hours as needed for wheezing or shortness of breath.     allopurinol (ZYLOPRIM) 300 MG tablet Take 300 mg by mouth daily.     amLODipine (NORVASC) 5 MG tablet Take 5 mg by mouth every morning.      aspirin 81 MG tablet Take 81 mg  by mouth daily.     atorvastatin (LIPITOR) 10 MG tablet Take 10 mg by mouth at bedtime.      budesonide-formoterol (SYMBICORT) 160-4.5 MCG/ACT inhaler Inhale 2 puffs into the lungs 2 (two) times daily as needed.     calcium carbonate (OS-CAL) 600 MG TABS Take 600 mg by mouth 2 (two) times daily with a meal.     clopidogrel (PLAVIX) 75 MG tablet Take 75 mg by mouth daily. Is back on now     cycloSPORINE (RESTASIS) 0.05 % ophthalmic emulsion Place 1 drop into both eyes 2 (two) times daily.     dexlansoprazole (DEXILANT) 60 MG capsule Take 60 mg by mouth daily.     diphenhydrAMINE (BENADRYL) 25 MG tablet Take 25 mg by mouth daily as needed for allergies.     doxepin (SINEQUAN) 75 MG capsule Take 75 mg by mouth at bedtime.     Ferrous Sulfate (IRON) 325 (65 Fe) MG TABS Take by mouth.     glipiZIDE (GLUCOTROL XL) 10 MG 24 hr tablet Take 10 mg by mouth 2 (two) times daily.     hyoscyamine (LEVSIN SL) 0.125 MG SL tablet Place 1 tablet (0.125 mg total) under the tongue every 6 (six) hours as needed for cramping. 90 tablet 0   ibuprofen (ADVIL,MOTRIN) 200 MG tablet Take 200-800 mg by mouth every 8 (eight) hours as needed for moderate pain.     insulin detemir (LEVEMIR) 100 UNIT/ML injection Inject 10 Units into the skin daily with lunch.     lubiprostone (AMITIZA) 8 MCG capsule TAKE 1 CAPSULE BY MOUTH  ONCE DAILY WITH BREAKFAST 90 capsule 3   metoprolol (TOPROL-XL) 100 MG 24 hr tablet Take 100 mg by mouth every morning.     nitroGLYCERIN (NITROGLYN) 2 % ointment Apply 1 inch topically every 8 (eight) hours as needed (hemorrhoids).     ACTEMRA ACTPEN 162 MG/0.9ML SOAJ Inject 162 mg into the skin every Thursday. (Patient not taking: Reported on 06/26/2021)     No current facility-administered medications for this visit.    Allergies as of 06/26/2021 - Review Complete 06/26/2021  Allergen Reaction Noted   Lisinopril Anaphylaxis 03/23/2021   Aspirin Nausea Only    Propoxyphene n-acetaminophen Nausea And  Vomiting    Zithromax [azithromycin] Nausea Only 10/01/2010   Erythromycin Rash 02/05/2014    Family History  Problem Relation Age of Onset   Colon cancer Father 71   Hypertension Sister    Hypertension Brother    Kidney disease Brother     Social History   Socioeconomic  History   Marital status: Widowed    Spouse name: Not on file   Number of children: Not on file   Years of education: Not on file   Highest education level: Not on file  Occupational History   Occupation: disabled  Tobacco Use   Smoking status: Former    Packs/day: 0.25    Years: 19.00    Pack years: 4.75    Types: Cigarettes    Quit date: 01/09/2007    Years since quitting: 14.4   Smokeless tobacco: Never  Vaping Use   Vaping Use: Never used  Substance and Sexual Activity   Alcohol use: No   Drug use: No   Sexual activity: Never  Other Topics Concern   Not on file  Social History Narrative   1 son-healthy   1 daughter-MVA (drunk-driver)   Social Determinants of Health   Financial Resource Strain: Not on file  Food Insecurity: Not on file  Transportation Needs: Not on file  Physical Activity: Not on file  Stress: Not on file  Social Connections: Not on file    Review of Systems: Gen: See HPI CV: Denies chest pain, palpitations. Resp: Denies dyspnea or cough. GI: See HPI Heme: See HPI  Physical Exam: BP 132/74   Pulse 90   Temp 97.6 F (36.4 C) (Temporal)   Ht 5\' 1"  (1.549 m)   Wt 174 lb 6.4 oz (79.1 kg)   BMI 32.95 kg/m  General:   Alert and oriented. No distress noted. Pleasant and cooperative.  Head:  Normocephalic and atraumatic. Eyes:  Conjuctiva clear without scleral icterus. Heart:  S1, S2 present without murmurs appreciated. Lungs:  Clear to auscultation bilaterally. No wheezes, rales, or rhonchi. No distress.  Abdomen:  +BS, soft, non-tender and non-distended. No rebound or guarding. No HSM or masses noted. Msk:  Symmetrical without gross deformities. Normal  posture. Extremities:  Without edema. Neurologic:  Alert and  oriented x4 Psych:  Normal mood and affect.    Assessment:  66 year old female with history of IBS-C, GERD, failed Niesen fundoplication, internal hemorrhoids, dysphagia, and recently diagnosed with metastatic disease, possibly secondary to primary renal cell carcinoma with liver metastasis and lymphadenopathy, following with Dr. Delton Coombes, presenting today for follow-up of GERD, IBS-C, and dysphagia.   GERD: Fairly well controlled on Dexilant 60 mg daily and Tums as needed a couple times a week.   Dysphagia: Chronic intermittent solid food dysphagia, but overall improved with better management of GERD.  Symptoms only occurring 1 or 2 times a month, and patient prefers to continue to monitor this for now.  Last EGD in September 2020 with normal esophagus s/p empiric dilation with improvement, but fairly quick return of symptoms within about 4 months.  BPE January 2021 with question of partially failed fundoplication wrap, normal esophageal distention, minimal esophageal dysmotility, 12.5 mm barium tablet passed without obstruction.   IBS-C:  Doing well, now only taking Amitiza 8 mcg as needed, once or twice a week.  Started eating more fruit and this has helped with constipation symptoms.  She does still use Levsin as needed for occasional abdominal pain, but only a couple times a month.  Denies BRBPR or melena.   Cancer with metastatic disease: In early April, found to have cluster of mildly prominent lymph nodes in the lower neck bilaterally, mediastinal and left hilar adenopathy concerning for metastatic disease, numerous low-density lesions throughout the liver most compatible with metastasis.  PET scan 4/20 with hypermetabolic lymphadenopathy throughout the neck and  chest, diffuse hypermetabolic liver metastasis, 9 mm hypermetabolic lesion in the lower pole of the left kidney suspicious for renal cell carcinoma.  Also underwent  liver biopsy on 4/20 with surgical pathology pending.  LFTs recently mildly elevated with AST 46, ALT 54.  Hemoglobin and platelets within normal limits.  CA 19-9 elevated at 73, CEA elevated at 24.3, CA125 and AFP within normal limits.  Patient reports PCP recommended EGD due to liver lesions, but states Dr. Delton Coombes advised her to hold off on this until her follow-up with him on 4/27.  I will reach out to Dr. Delton Coombes to review her case and see if EGD and/or early interval colonoscopy are recommended.   ?Low iron:  Patient states about 8 months ago, she was told she had low iron by her PCP and was started on oral iron daily. No labs in our EMR reflecting this.  No documentation of anemia.  Plan:  Continue Dexilant 60 mg daily. Continue Tums as needed. Reinforced GERD diet/lifestyle.  Separate written instructions provided. Continue Amitiza 8 mcg as needed. Continue Levsin as needed. I will review case with Dr. Delton Coombes to determine if EGD or early interval colonoscopy are needed. Request prior labs from PCP. Follow-up date TBD pending review with Dr. Delton Coombes.   Aliene Altes, PA-C [facility] 06/26/2021

## 2021-06-26 ENCOUNTER — Encounter: Payer: Self-pay | Admitting: Gastroenterology

## 2021-06-26 ENCOUNTER — Ambulatory Visit (INDEPENDENT_AMBULATORY_CARE_PROVIDER_SITE_OTHER): Payer: Medicare Other | Admitting: Gastroenterology

## 2021-06-26 VITALS — BP 132/74 | HR 90 | Temp 97.6°F | Ht 61.0 in | Wt 174.4 lb

## 2021-06-26 DIAGNOSIS — K589 Irritable bowel syndrome without diarrhea: Secondary | ICD-10-CM | POA: Diagnosis not present

## 2021-06-26 DIAGNOSIS — C801 Malignant (primary) neoplasm, unspecified: Secondary | ICD-10-CM | POA: Insufficient documentation

## 2021-06-26 DIAGNOSIS — K219 Gastro-esophageal reflux disease without esophagitis: Secondary | ICD-10-CM

## 2021-06-26 DIAGNOSIS — R1310 Dysphagia, unspecified: Secondary | ICD-10-CM

## 2021-06-26 LAB — SURGICAL PATHOLOGY

## 2021-06-26 MED ORDER — HYOSCYAMINE SULFATE 0.125 MG SL SUBL: 0.1250 mg | SUBLINGUAL_TABLET | Freq: Four times a day (QID) | SUBLINGUAL | 0 refills | Status: DC | PRN

## 2021-06-26 NOTE — Patient Instructions (Signed)
Continue Dexilant 60 mg daily. ? ?Follow a GERD diet:  ?Avoid fried, fatty, greasy, spicy, citrus foods. ?Avoid caffeine and carbonated beverages. ?Avoid chocolate. ?Try eating 4-6 small meals a day rather than 3 large meals. ?Do not eat within 3 hours of laying down. ?Prop head of bed up on wood or bricks to create a 6 inch incline. ? ?Continue Amitiza 8 mcg as needed for constipation. ? ?Continue Levsin as needed for occasional abdominal cramping. ? ?I am going to review your case with Dr. Delton Coombes.  We will reach out to you probably early next week with any additional recommendations. ? ?It was good to see you again today! I will be praying for you!  ? ?Aliene Altes, PA-C ?[facility] ? ?

## 2021-06-27 ENCOUNTER — Ambulatory Visit (HOSPITAL_COMMUNITY): Payer: Medicare Other | Admitting: Hematology

## 2021-06-29 ENCOUNTER — Inpatient Hospital Stay (HOSPITAL_COMMUNITY): Payer: Medicare Other

## 2021-06-29 ENCOUNTER — Inpatient Hospital Stay (HOSPITAL_BASED_OUTPATIENT_CLINIC_OR_DEPARTMENT_OTHER): Payer: Medicare Other | Admitting: Hematology

## 2021-06-29 VITALS — BP 162/78 | HR 111 | Temp 97.0°F | Resp 18 | Ht 61.0 in | Wt 174.5 lb

## 2021-06-29 DIAGNOSIS — R160 Hepatomegaly, not elsewhere classified: Secondary | ICD-10-CM | POA: Diagnosis not present

## 2021-06-29 DIAGNOSIS — R93422 Abnormal radiologic findings on diagnostic imaging of left kidney: Secondary | ICD-10-CM | POA: Diagnosis not present

## 2021-06-29 DIAGNOSIS — C787 Secondary malignant neoplasm of liver and intrahepatic bile duct: Secondary | ICD-10-CM | POA: Diagnosis not present

## 2021-06-29 DIAGNOSIS — R9389 Abnormal findings on diagnostic imaging of other specified body structures: Secondary | ICD-10-CM | POA: Diagnosis not present

## 2021-06-29 DIAGNOSIS — Z808 Family history of malignant neoplasm of other organs or systems: Secondary | ICD-10-CM | POA: Diagnosis not present

## 2021-06-29 DIAGNOSIS — R932 Abnormal findings on diagnostic imaging of liver and biliary tract: Secondary | ICD-10-CM | POA: Diagnosis not present

## 2021-06-29 DIAGNOSIS — R590 Localized enlarged lymph nodes: Secondary | ICD-10-CM

## 2021-06-29 DIAGNOSIS — C801 Malignant (primary) neoplasm, unspecified: Secondary | ICD-10-CM | POA: Diagnosis not present

## 2021-06-29 DIAGNOSIS — Z803 Family history of malignant neoplasm of breast: Secondary | ICD-10-CM | POA: Diagnosis not present

## 2021-06-29 DIAGNOSIS — F17210 Nicotine dependence, cigarettes, uncomplicated: Secondary | ICD-10-CM | POA: Diagnosis not present

## 2021-06-29 DIAGNOSIS — Z800 Family history of malignant neoplasm of digestive organs: Secondary | ICD-10-CM | POA: Diagnosis not present

## 2021-06-29 LAB — CBC WITH DIFFERENTIAL/PLATELET
Abs Immature Granulocytes: 0.01 10*3/uL (ref 0.00–0.07)
Basophils Absolute: 0.1 10*3/uL (ref 0.0–0.1)
Basophils Relative: 1 %
Eosinophils Absolute: 0.3 10*3/uL (ref 0.0–0.5)
Eosinophils Relative: 4 %
HCT: 46.6 % — ABNORMAL HIGH (ref 36.0–46.0)
Hemoglobin: 15.2 g/dL — ABNORMAL HIGH (ref 12.0–15.0)
Immature Granulocytes: 0 %
Lymphocytes Relative: 42 %
Lymphs Abs: 3.2 10*3/uL (ref 0.7–4.0)
MCH: 30.1 pg (ref 26.0–34.0)
MCHC: 32.6 g/dL (ref 30.0–36.0)
MCV: 92.3 fL (ref 80.0–100.0)
Monocytes Absolute: 0.9 10*3/uL (ref 0.1–1.0)
Monocytes Relative: 11 %
Neutro Abs: 3.2 10*3/uL (ref 1.7–7.7)
Neutrophils Relative %: 42 %
Platelets: 250 10*3/uL (ref 150–400)
RBC: 5.05 MIL/uL (ref 3.87–5.11)
RDW: 14.9 % (ref 11.5–15.5)
WBC: 7.7 10*3/uL (ref 4.0–10.5)
nRBC: 0 % (ref 0.0–0.2)

## 2021-06-29 LAB — COMPREHENSIVE METABOLIC PANEL
ALT: 37 U/L (ref 0–44)
AST: 30 U/L (ref 15–41)
Albumin: 4.2 g/dL (ref 3.5–5.0)
Alkaline Phosphatase: 52 U/L (ref 38–126)
Anion gap: 9 (ref 5–15)
BUN: 13 mg/dL (ref 8–23)
CO2: 26 mmol/L (ref 22–32)
Calcium: 9.5 mg/dL (ref 8.9–10.3)
Chloride: 108 mmol/L (ref 98–111)
Creatinine, Ser: 0.69 mg/dL (ref 0.44–1.00)
GFR, Estimated: >60 mL/min (ref >60)
Glucose, Bld: 84 mg/dL (ref 70–99)
Potassium: 3.6 mmol/L (ref 3.5–5.1)
Sodium: 143 mmol/L (ref 135–145)
Total Bilirubin: 0.6 mg/dL (ref 0.3–1.2)
Total Protein: 7.9 g/dL (ref 6.5–8.1)

## 2021-06-29 MED ORDER — PROCHLORPERAZINE MALEATE 10 MG PO TABS: 10.0000 mg | ORAL_TABLET | Freq: Four times a day (QID) | ORAL | 3 refills | Status: DC | PRN

## 2021-06-29 NOTE — Patient Instructions (Addendum)
[place] at [facility] ?Discharge Instructions ? ? ?You were seen and examined today by Dr. Delton Coombes. ? ?He reviewed the results of your biopsy and PET scan. The biopsy shows that there is cancer, but they cannot tell from where it originated. The PET scan shows areas in the chest and liver lighting up, but we cannot treat the cancer until we know where it is coming from. ? ?We will send special testing on your biopsy to see what treatment options we have to treat this cancer. This will take about 2 weeks to get the results.  ? ?We will see you back after we get these results.  ? ? ?Thank you for choosing [place] at [facility] to provide your oncology and hematology care.  To afford each patient quality time with our provider, please arrive at least 15 minutes before your scheduled appointment time.  ? ?If you have a lab appointment with the [place] please come in thru the Main Entrance and check in at the main information desk. ? ?You need to re-schedule your appointment should you arrive 10 or more minutes late.  We strive to give you quality time with our providers, and arriving late affects you and other patients whose appointments are after yours.  Also, if you no show three or more times for appointments you may be dismissed from the clinic at the providers discretion.     ?Again, thank you for choosing [facility].  Our hope is that these requests will decrease the amount of time that you wait before being seen by our physicians.       ?_____________________________________________________________ ? ?Should you have questions after your visit to [facility], please contact our office at [phone] and follow the prompts.  Our office hours are 8:00 a.m. and 4:30 p.m. Monday - Friday.  Please note that voicemails left after 4:00 p.m. may not be returned until the following business day.  We are closed weekends and major  holidays.  You do have access to a nurse 24-7, just call the main number to the clinic [phone] and do not press any options, hold on the line and a nurse will answer the phone.   ? ?For prescription refill requests, have your pharmacy contact our office and allow 72 hours.   ? ?Due to Covid, you will need to wear a mask upon entering the hospital. If you do not have a mask, a mask will be given to you at the Main Entrance upon arrival. For doctor visits, patients may have 1 support person age 68 or older with them. For treatment visits, patients can not have anyone with them due to social distancing guidelines and our immunocompromised population.  ? ?   ?

## 2021-06-29 NOTE — Progress Notes (Signed)
[employer] testing requested on [id number] per Dr. Delton Coombes ?

## 2021-06-29 NOTE — Progress Notes (Signed)
[facility] [place], [place] [address]   CLINIC:  Medical Oncology/Hematology  PCP:  Glenda Chroman, MD [address] / [place] [id number] [phone]   REASON FOR VISIT:  Metastatic poorly differentiated carcinoma  PRIOR THERAPY: none  NGS Results: not done  CURRENT THERAPY: under work-up  BRIEF ONCOLOGIC HISTORY:  Oncology History   No history exists.    CANCER STAGING:  Cancer Staging  No matching staging information was found for the patient.  INTERVAL HISTORY:  Brittney Martinez, a 66 y.o. female, returns for routine follow-up of her liver masses. Brittney Martinez was last seen on 06/07/2021.   Today she reports feeling good. She reports right shoulder pain starting yesterday. She denies vomiting. She reports she quit smoking 6 months ago, and previously she smoked 1/2 ppd. She reports she underwent total abdominal hysterectomy in 1982 due to "spots" found on her cervix. She reports a history of arthritis. She reports occasional nausea.   REVIEW OF SYSTEMS:  Review of Systems  Constitutional:  Negative for appetite change and fatigue.  HENT:   Positive for sore throat.   Gastrointestinal:  Positive for nausea. Negative for vomiting.  Musculoskeletal:  Positive for arthralgias (R shoulder).  Neurological:  Positive for dizziness.  All other systems reviewed and are negative.  PAST MEDICAL/SURGICAL HISTORY:  Past Medical History:  Diagnosis Date   Abdominal aortic stenosis    Anal fissure    Anxiety    Asthma    Complication of anesthesia    COPD (chronic obstructive pulmonary disease) ([place])    Depression    grief   Diabetes mellitus    Diverticulitis    GERD (gastroesophageal reflux disease)    Glaucoma    Gout    Hypertension    IBS (irritable bowel syndrome)    PONV (postoperative nausea and vomiting)    RA (rheumatoid arthritis) ([place])    Sleep apnea    had one at one time but not anymore   Stroke ([place])    "mini-stroke"- no  deficits   TIA (transient ischemic attack)    2010. No deficits   Past Surgical History:  Procedure Laterality Date   ABDOMINAL HYSTERECTOMY  1982   complete   BIOPSY  11/17/2018   Procedure: BIOPSY;  Surgeon: Daneil Dolin, MD;  [facility]: [facility];  Service: Endoscopy;;   BREAST LUMPECTOMY     right-benign   CATARACT EXTRACTION W/PHACO Right 05/13/2017   Procedure: CATARACT EXTRACTION PHACO AND INTRAOCULAR LENS PLACEMENT ([place]);  Surgeon: Tonny Branch, MD;  Location: [facility];  Service: Ophthalmology;  Laterality: Right;  CDE: 8.59   CATARACT EXTRACTION W/PHACO Left 05/27/2017   Procedure: CATARACT EXTRACTION WITH  PHACOEMULSIFICATION AND INTRAOCULAR LENS PLACEMENT LEFT EYE;  Surgeon: Tonny Branch, MD;  Location: [facility];  Service: Ophthalmology;  Laterality: Left;  CDE: 5.66   CHOLECYSTECTOMY  2003   COLONOSCOPY  12/14/09   Dr. Gala Romney :anal papilla and hemorrhoids,diminutive hperplastic rectal polyps/normal colon   COLONOSCOPY N/A 10/29/2013   VQM:GQQP papilla and internal hemorrhoids; colonic polyps-removed as described above. I suspect benign anorectal bleeding in the setting of hemorrhoids and  possibly fissure. tubular adenoma. next TCS 10/2020.   COLONOSCOPY WITH PROPOFOL N/A 11/17/2018   Dr. Gala Romney: Nonbleeding internal hemorrhoids, hemorrhoids were moderate and grade.  Exam otherwise normal.  Colonoscopy in 5 years.   ELBOW SURGERY     right   ESOPHAGOGASTRODUODENOSCOPY  12/14/09   Dr. Gala Romney :schatzkis ring  48F, otherwise normal   ESOPHAGOGASTRODUODENOSCOPY N/A 10/29/2013   RMR: Distal esophageal pseudodiverticulum/Nissen fundoplication   ESOPHAGOGASTRODUODENOSCOPY (EGD) WITH PROPOFOL N/A 11/17/2018   Dr. Gala Romney: Normal esophagus status post dilation.  Nonbleeding erosive gastropathy with benign biopsy.   FINGER SURGERY     on right middle, pointer, and index fingers   FISSURECTOMY     several   INCISIONAL HERNIA REPAIR N/A 03/22/2014   Procedure: [name] HERNIORRHAPHY;  Surgeon:  Jamesetta So, MD;  Location: [facility];  Service: General;  Laterality: N/A;   INSERTION OF MESH N/A 03/22/2014   Procedure: INSERTION OF MESH;  Surgeon: Jamesetta So, MD;  Location: [facility];  Service: General;  Laterality: N/A;   MALONEY DILATION N/A 11/17/2018   Procedure: [name] DILATION;  Surgeon: Daneil Dolin, MD;  Location: [facility];  Service: Endoscopy;  Laterality: N/A;   NISSEN FUNDOPLICATION  5009   [facility]   WRIST SURGERY     left    SOCIAL HISTORY:  Social History   Socioeconomic History   Marital status: Widowed    Spouse name: Not on file   Number of children: Not on file   Years of education: Not on file   Highest education level: Not on file  Occupational History   Occupation: disabled  Tobacco Use   Smoking status: Former    Packs/day: 0.25    Years: 19.00    Pack years: 4.75    Types: Cigarettes    Quit date: 01/09/2007    Years since quitting: 14.4   Smokeless tobacco: Never  Vaping Use   Vaping Use: Never used  Substance and Sexual Activity   Alcohol use: No   Drug use: No   Sexual activity: Never  Other Topics Concern   Not on file  Social History Narrative   1 son-healthy   1 daughter-MVA (drunk-driver)   Social Determinants of Health   Financial Resource Strain: Not on file  Food Insecurity: Not on file  Transportation Needs: Not on file  Physical Activity: Not on file  Stress: Not on file  Social Connections: Not on file  Intimate Partner Violence: Not on file    FAMILY HISTORY:  Family History  Problem Relation Age of Onset   Colon cancer Father 18   Hypertension Sister    Hypertension Brother    Kidney disease Brother     CURRENT MEDICATIONS:  Current Outpatient Medications  Medication Sig Dispense Refill   albuterol (PROVENTIL HFA;VENTOLIN HFA) 108 (90 BASE) MCG/ACT inhaler Inhale 2 puffs into the lungs every 6 (six) hours as needed for wheezing or shortness of breath.     albuterol (PROVENTIL) (2.5 MG/3ML) 0.083%  nebulizer solution Take 2.5 mg by nebulization every 6 (six) hours as needed for wheezing or shortness of breath.     allopurinol (ZYLOPRIM) 300 MG tablet Take 300 mg by mouth daily.     amLODipine (NORVASC) 5 MG tablet Take 5 mg by mouth every morning.      aspirin 81 MG tablet Take 81 mg by mouth daily.     atorvastatin (LIPITOR) 10 MG tablet Take 10 mg by mouth at bedtime.      budesonide-formoterol (SYMBICORT) 160-4.5 MCG/ACT inhaler Inhale 2 puffs into the lungs 2 (two) times daily as needed.     calcium carbonate (OS-CAL) 600 MG TABS Take 600 mg by mouth 2 (two) times daily with a meal.     clopidogrel (PLAVIX) 75 MG tablet Take 75 mg by mouth daily.  Is back on now     cycloSPORINE (RESTASIS) 0.05 % ophthalmic emulsion Place 1 drop into both eyes 2 (two) times daily.     dexlansoprazole (DEXILANT) 60 MG capsule Take 60 mg by mouth daily.     diphenhydrAMINE (BENADRYL) 25 MG tablet Take 25 mg by mouth daily as needed for allergies.     doxepin (SINEQUAN) 75 MG capsule Take 75 mg by mouth at bedtime.     Ferrous Sulfate (IRON) 325 (65 Fe) MG TABS Take by mouth.     glipiZIDE (GLUCOTROL XL) 10 MG 24 hr tablet Take 10 mg by mouth 2 (two) times daily.     hyoscyamine (LEVSIN SL) 0.125 MG SL tablet Place 1 tablet (0.125 mg total) under the tongue every 6 (six) hours as needed for cramping. 90 tablet 0   ibuprofen (ADVIL,MOTRIN) 200 MG tablet Take 200-800 mg by mouth every 8 (eight) hours as needed for moderate pain.     lubiprostone (AMITIZA) 8 MCG capsule TAKE 1 CAPSULE BY MOUTH  ONCE DAILY WITH BREAKFAST 90 capsule 3   metoprolol (TOPROL-XL) 100 MG 24 hr tablet Take 100 mg by mouth every morning.     nitroGLYCERIN (NITROGLYN) 2 % ointment Apply 1 inch topically every 8 (eight) hours as needed (hemorrhoids).     methocarbamol (ROBAXIN) 500 MG tablet Take 500 mg by mouth every 6 (six) hours as needed.     predniSONE (DELTASONE) 5 MG tablet Take 5 mg by mouth daily.     zolpidem (AMBIEN) 5 MG  tablet Take 5 mg by mouth at bedtime as needed.     No current facility-administered medications for this visit.    ALLERGIES:  Allergies  Allergen Reactions   Lisinopril Anaphylaxis    Closing of throat   Aspirin Nausea Only   Propoxyphene N-Acetaminophen Nausea And Vomiting   Zithromax [Azithromycin] Nausea Only   Erythromycin Rash    PHYSICAL EXAM:  Performance status (ECOG): 1 - Symptomatic but completely ambulatory  Vitals:   06/29/21 1003  BP: (!) 162/78  Pulse: (!) 111  Resp: 18  Temp: (!) 97 F (36.1 C)  SpO2: 95%   Wt Readings from Last 3 Encounters:  06/29/21 174 lb 8 oz (79.2 kg)  06/26/21 174 lb 6.4 oz (79.1 kg)  06/22/21 186 lb (84.4 kg)   Physical Exam Vitals reviewed.  Constitutional:      Appearance: Normal appearance. She is obese.  Cardiovascular:     Rate and Rhythm: Normal rate and regular rhythm.     Pulses: Normal pulses.     Heart sounds: Normal heart sounds.  Pulmonary:     Effort: Pulmonary effort is normal.     Breath sounds: Normal breath sounds.  Neurological:     General: No focal deficit present.     Mental Status: She is alert and oriented to person, place, and time.  Psychiatric:        Mood and Affect: Mood normal.        Behavior: Behavior normal.     LABORATORY DATA:  I have reviewed the labs as listed.     Latest Ref Rng & Units 06/22/2021   10:56 AM 06/07/2021    2:19 PM 03/21/2018    5:24 AM  CBC  WBC 4.0 - 10.5 K/uL 5.6   5.2   9.5    Hemoglobin 12.0 - 15.0 g/dL 13.5   15.2   12.1    Hematocrit 36.0 - 46.0 % 41.9   47.4  39.2    Platelets 150 - 400 K/uL 184   199   249        Latest Ref Rng & Units 06/07/2021    2:19 PM 05/24/2021   12:37 PM 03/21/2018    5:24 AM  CMP  Glucose 70 - 99 mg/dL 169    279    BUN 8 - 23 mg/dL 10    11    Creatinine 0.44 - 1.00 mg/dL 0.89   0.90   0.67    Sodium 135 - 145 mmol/L 141    138    Potassium 3.5 - 5.1 mmol/L 4.6    4.7    Chloride 98 - 111 mmol/L 108    111    CO2 22 -  32 mmol/L 25    21    Calcium 8.9 - 10.3 mg/dL 9.9    7.8    Total Protein 6.5 - 8.1 g/dL 7.5      Total Bilirubin 0.3 - 1.2 mg/dL 0.7      Alkaline Phos 38 - 126 U/L 55      AST 15 - 41 U/L 46      ALT 0 - 44 U/L 54        DIAGNOSTIC IMAGING:  I have independently reviewed the scans and discussed with the patient. NM PET Image Initial (PI) Skull Base To Thigh (F-18 FDG)  Result Date: 06/23/2021 CLINICAL DATA:  Initial treatment strategy for metastatic disease of unknown primary. EXAM: NUCLEAR MEDICINE PET SKULL BASE TO THIGH TECHNIQUE: 9.6 mCi F-18 FDG was injected intravenously. Full-ring PET imaging was performed from the skull base to thigh after the radiotracer. CT data was obtained and used for attenuation correction and anatomic localization. Fasting blood glucose: 70 mg/dl COMPARISON:  CT on 05/24/2021 FINDINGS: Mediastinal blood-pool activity (background): SUV max = 2.8 Liver activity (reference): SUV max = N/A NECK: Hypermetabolic sub-centimeter left level 5A lymph nodes are seen which measure up to 7 in size, and has SUV max of 6.4. Mild bilateral supraclavicular lymphadenopathy is seen which is hypermetabolic, with index left supraclavicular lymph node measuring 14 mm on image 49/3, with SUV max of 13.1. Bilateral sub-cm level 4 lower jugular lymphadenopathy is also seen which is hypermetabolic, with SUV max on the left measuring 10.9. Incidental CT findings:  None. CHEST: Hypermetabolic lymphadenopathy is seen throughout the mediastinum, with largest lymph node in the right paratracheal region measuring 1.9 cm on image 78/3, with SUV max of 15.7. Hypermetabolic left hilar lymphadenopathy is seen with SUV max of 15.1. No suspicious pulmonary nodules or masses are seen on CT images. Ill-defined patchy ground-glass opacities are seen in the peripheral upper lung fields, which show FDG uptake and are consistent with infectious or inflammatory etiology. No evidence of pleural effusion.  Incidental CT findings:  None. ABDOMEN/PELVIS: hypermetabolic masses are seen throughout the liver. Largest in segment 3 of the left hepatic lobe measures approximately 4.4 cm in diameter and has SUV max of 13.2. A 9 mm subcapsular lesion in the anterior lower pole of the left kidney is hypermetabolic, with SUV max 4.9. No abnormal hypermetabolic activity within the pancreas, adrenal glands, or spleen. No hypermetabolic lymph nodes in the abdomen or pelvis. Incidental CT findings: Aortic atherosclerotic calcification noted. Prior hysterectomy noted. Adnexal regions are unremarkable in appearance. SKELETON: No focal hypermetabolic bone lesions to suggest skeletal metastasis. Incidental CT findings:  None. IMPRESSION: Hypermetabolic lymphadenopathy throughout the neck and chest. Diffuse hypermetabolic liver metastases. 9 mm hypermetabolic lesion  in lower pole of left kidney, suspicious for small renal cell carcinoma. Electronically Signed   By: Marlaine Hind M.D.   On: 06/23/2021 14:05   [place] CORE BIOPSY (LIVER)  Result Date: 06/22/2021 INDICATION: 66 year old female with metastatic disease of the liver referred for biopsy EXAM: IMAGE GUIDED LIVER MASS BIOPSY MEDICATIONS: None. ANESTHESIA/SEDATION: Moderate (conscious) sedation was employed during this procedure. A total of Versed 1.0 mg and Fentanyl 25 mcg was administered intravenously by the radiology nurse. Total intra-service moderate Sedation Time: 10 minutes. The patient's level of consciousness and vital signs were monitored continuously by radiology nursing throughout the procedure under my direct supervision. COMPLICATIONS: None PROCEDURE: Informed written consent was obtained from the patient after a thorough discussion of the procedural risks, benefits and alternatives. All questions were addressed. Maximal Sterile Barrier Technique was utilized including caps, mask, sterile gowns, sterile gloves, sterile drape, hand hygiene and skin antiseptic. A timeout  was performed prior to the initiation of the procedure. Ultrasound survey of the left liver lobe performed with images stored and sent to PACs. The subxiphoid region was prepped with chlorhexidine in a sterile fashion, and a sterile drape was applied covering the operative field. A sterile gown and sterile gloves were used for the procedure. Local anesthesia was provided with 1% Lidocaine. The patient was prepped and draped sterilely and the skin and subcutaneous tissues were generously infiltrated with 1% lidocaine. A 17 gauge introducer needle was then advanced under ultrasound guidance in subxiphoid location into the left liver lobe, targeting the inferior hypoechoic mass. The stylet was removed, and multiple separate 18 gauge core biopsy were retrieved. Samples were placed into formalin for transportation to the lab. Gel-Foam slurry was then infused with a small amount of saline for assistance with hemostasis. The needle was removed, and a final ultrasound image was performed. The patient tolerated the procedure well and remained hemodynamically stable throughout. No complications were encountered and no significant blood loss was encounter. IMPRESSION: Status post ultrasound-guided biopsy of left-sided liver mass. Signed, Dulcy Fanny. Dellia Nims, RPVI [facility] Electronically Signed   By: Corrie Mckusick D.O.   On: 06/22/2021 13:54     ASSESSMENT:  Liver masses/mediastinal and left supraclavicular adenopathy: - She has felt left supraclavicular lymph node since November 2022. - She reported 26 pound weight loss in the last 6 to 8 months.  She reports she has been on Ozempic for the last 8 months.  Denies any fevers or night sweats although reports severe tiredness which is worsening.  No new pains. - She had a hysterectomy at age 22 due to cancer.  Unclear uterine or cervical.  Did not require any further therapies. - EGD/colonoscopy on 11/17/2018:  Nonbleeding internal hemorrhoids, normal colon, normal esophagus, nonbleeding erosive gastropathy, normal duodenum. - CT soft tissue neck (05/24/2021): Cluster of mildly prominent lymph nodes in the lower neck bilaterally left greater than right. - CT CAP (05/24/2021): Mediastinal and left hilar adenopathy.  Numerous enhancing low-density lesions throughout the liver measuring up to 4.1 cm.    Social/family history: - She lives at home with her son and daughter-in-law.  She has been on disability secondary to IBS since 2001.  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  She is a current active smoker, 1/3 pack/day for the last 30 years.   PLAN:  Metastatic poorly differentiated carcinoma in the liver, chest adenopathy: - I have reviewed results of the liver biopsy  from 06/22/2021. - Pathology shows poorly differentiated carcinoma with necrosis.  Differential includes renal versus gynecological primary. - Reviewed PET scan images with the patient showing hypermetabolic lymphadenopathy throughout the neck and chest.  Diffuse hypermetabolic liver metastasis.  9 mm hypermetabolic lesion in the lower pole of the left kidney suspicious for small RCC. - She reports some pain in the right supraclavicular region and took NSAID.  She reported pain after her arthritic medicines were discontinued by her rheumatologist. - I have recommended NGS testing by [facility] which will also give was a handout about tissue of origin of this carcinoma. - Recommend port placement. - RTC 3 weeks for follow-up.  2.  Intermittent nausea: - We will start her on Compazine every 6 hours as needed.   Orders placed this encounter:  No orders of the defined types were placed in this encounter.    Derek Jack, MD [place] [phone]   I, Thana Ates, am acting as a scribe for Dr. Derek Jack.  I, Derek Jack MD, have reviewed the above  documentation for accuracy and completeness, and I agree with the above.

## 2021-07-02 DIAGNOSIS — E119 Type 2 diabetes mellitus without complications: Secondary | ICD-10-CM | POA: Diagnosis not present

## 2021-07-06 ENCOUNTER — Encounter: Payer: Self-pay | Admitting: General Surgery

## 2021-07-06 ENCOUNTER — Ambulatory Visit (INDEPENDENT_AMBULATORY_CARE_PROVIDER_SITE_OTHER): Payer: Medicare Other | Admitting: General Surgery

## 2021-07-06 VITALS — BP 130/82 | HR 82 | Temp 97.8°F | Resp 14 | Ht 61.0 in | Wt 174.0 lb

## 2021-07-06 DIAGNOSIS — C787 Secondary malignant neoplasm of liver and intrahepatic bile duct: Secondary | ICD-10-CM

## 2021-07-06 NOTE — Progress Notes (Signed)
[facility] History and Physical  Reason for Referral: Metastatic poorly differentiated carcinoma  Referring Physician: Dr. Delton Coombes, Oncology   Chief Complaint   New Patient (Initial Visit)     Brittney Martinez is a 66 y.o. female.  HPI: Brittney Martinez ia 66 yo with a poor differentiated metastatic carcinoma to her hilar nodes and her liver that is either a likely renal or gynecologic in origin based on Dr. Tomie China notes. She is here to get her port placed to initiate chemotherapy. She is here with family today. She had been having some right shoulder pain per their documentation but this resolved now she reports. She is right handed.   Past Medical History:  Diagnosis Date   Abdominal aortic stenosis    Anal fissure    Anxiety    Asthma    Complication of anesthesia    COPD (chronic obstructive pulmonary disease) (HCC)    Depression    grief   Diabetes mellitus    Diverticulitis    GERD (gastroesophageal reflux disease)    Glaucoma    Gout    Hypertension    IBS (irritable bowel syndrome)    PONV (postoperative nausea and vomiting)    RA (rheumatoid arthritis) ([place])    Sleep apnea    had one at one time but not anymore   Stroke ([place])    "mini-stroke"- no deficits   TIA (transient ischemic attack)    2010. No deficits    Past Surgical History:  Procedure Laterality Date   ABDOMINAL HYSTERECTOMY  1982   complete   BIOPSY  11/17/2018   Procedure: BIOPSY;  Surgeon: Daneil Dolin, MD;  [facility]: [facility];  Service: Endoscopy;;   BREAST LUMPECTOMY     right-benign   CATARACT EXTRACTION W/PHACO Right 05/13/2017   Procedure: CATARACT EXTRACTION PHACO AND INTRAOCULAR LENS PLACEMENT ([place]);  Surgeon: Tonny Branch, MD;  Location: [facility];  Service: Ophthalmology;  Laterality: Right;  CDE: 8.59   CATARACT EXTRACTION W/PHACO Left 05/27/2017   Procedure: CATARACT EXTRACTION WITH  PHACOEMULSIFICATION AND INTRAOCULAR LENS PLACEMENT LEFT EYE;  Surgeon: Tonny Branch, MD;  Location: [facility];  Service: Ophthalmology;  Laterality: Left;  CDE: 5.66   CHOLECYSTECTOMY  2003   COLONOSCOPY  12/14/09   Dr. Gala Romney :anal papilla and hemorrhoids,diminutive hperplastic rectal polyps/normal colon   COLONOSCOPY N/A 10/29/2013   FIE:PPIR papilla and internal hemorrhoids; colonic polyps-removed as described above. I suspect benign anorectal bleeding in the setting of hemorrhoids and  possibly fissure. tubular adenoma. next TCS 10/2020.   COLONOSCOPY WITH PROPOFOL N/A 11/17/2018   Dr. Gala Romney: Nonbleeding internal hemorrhoids, hemorrhoids were moderate and grade.  Exam otherwise normal.  Colonoscopy in 5 years.   ELBOW SURGERY     right   ESOPHAGOGASTRODUODENOSCOPY  12/14/09   Dr. Gala Romney :schatzkis ring 55F, otherwise normal   ESOPHAGOGASTRODUODENOSCOPY N/A 10/29/2013   RMR: Distal esophageal pseudodiverticulum/Nissen fundoplication   ESOPHAGOGASTRODUODENOSCOPY (EGD) WITH PROPOFOL N/A 11/17/2018   Dr. Gala Romney: Normal esophagus status post dilation.  Nonbleeding erosive gastropathy with benign biopsy.   FINGER SURGERY     on right middle, pointer, and index fingers   FISSURECTOMY     several   INCISIONAL HERNIA REPAIR N/A 03/22/2014   Procedure: [name] HERNIORRHAPHY;  Surgeon: Jamesetta So, MD;  Location: [facility];  Service: General;  Laterality: N/A;   INSERTION OF MESH N/A 03/22/2014   Procedure: INSERTION OF MESH;  Surgeon: Jamesetta So, MD;  Location: [facility];  Service: General;  Laterality: N/A;   MALONEY DILATION N/A 11/17/2018   Procedure: [name] DILATION;  Surgeon: Daneil Dolin, MD;  Location[facility];  Service: Endoscopy;  Laterality: N/A;   NISSEN FUNDOPLICATION  [id number]   Brittney Martinez   WRIST SURGERY     left    Family History  Problem Relation Age of Onset   Colon cancer Father 78   Hypertension Sister    Hypertension Brother    Kidney disease Brother     Social History   Tobacco Use   Smoking status: Former    Packs/day: 0.25    Years:  19.00    Pack years: 4.75    Types: Cigarettes    Quit date: 01/09/2007    Years since quitting: 14.4   Smokeless tobacco: Never  Vaping Use   Vaping Use: Never used  Substance Use Topics   Alcohol use: No   Drug use: No    Medications: I have reviewed the patient's current medications. Allergies as of 07/06/2021       Reactions   Lisinopril Anaphylaxis   Closing of throat   Aspirin Nausea Only   Propoxyphene N-acetaminophen Nausea And Vomiting   Zithromax [azithromycin] Nausea Only   Erythromycin Rash        Medication List        Accurate as of Jul 06, 2021  9:40 AM. If you have any questions, ask your nurse or doctor.          albuterol (2.5 MG/3ML) 0.083% nebulizer solution Commonly known as: PROVENTIL Take 2.5 mg by nebulization every 6 (six) hours as needed for wheezing or shortness of breath.   albuterol 108 (90 Base) MCG/ACT inhaler Commonly known as: VENTOLIN HFA Inhale 2 puffs into the lungs every 6 (six) hours as needed for wheezing or shortness of breath.   allopurinol 300 MG tablet Commonly known as: ZYLOPRIM Take 300 mg by mouth daily.   amLODipine 5 MG tablet Commonly known as: NORVASC Take 5 mg by mouth every morning.   aspirin 81 MG tablet Take 81 mg by mouth daily.   atorvastatin 10 MG tablet Commonly known as: LIPITOR Take 10 mg by mouth at bedtime.   budesonide-formoterol 160-4.5 MCG/ACT inhaler Commonly known as: SYMBICORT Inhale 2 puffs into the lungs 2 (two) times daily as needed.   calcium carbonate 600 MG Tabs tablet Commonly known as: OS-CAL Take 600 mg by mouth 2 (two) times daily with a meal.   clopidogrel 75 MG tablet Commonly known as: PLAVIX Take 75 mg by mouth daily. Is back on now   cycloSPORINE 0.05 % ophthalmic emulsion Commonly known as: RESTASIS Place 1 drop into both eyes 2 (two) times daily.   dexlansoprazole 60 MG capsule Commonly known as: DEXILANT Take 60 mg by mouth daily.   diphenhydrAMINE 25 MG  tablet Commonly known as: BENADRYL Take 25 mg by mouth daily as needed for allergies.   doxepin 75 MG capsule Commonly known as: SINEQUAN Take 75 mg by mouth at bedtime.   glipiZIDE 10 MG 24 hr tablet Commonly known as: GLUCOTROL XL Take 10 mg by mouth 2 (two) times daily.   hyoscyamine 0.125 MG SL tablet Commonly known as: LEVSIN SL Place 1 tablet (0.125 mg total) under the tongue every 6 (six) hours as needed for cramping.   ibuprofen 200 MG tablet Commonly known as: ADVIL Take 200-800 mg by mouth every 8 (eight) hours as needed for moderate pain.   Iron 325 (65 Fe) MG Tabs  Take by mouth.   lubiprostone 8 MCG capsule Commonly known as: Amitiza TAKE 1 CAPSULE BY MOUTH  ONCE DAILY WITH BREAKFAST   methocarbamol 500 MG tablet Commonly known as: ROBAXIN Take 500 mg by mouth every 6 (six) hours as needed.   metoprolol succinate 100 MG 24 hr tablet Commonly known as: TOPROL-XL Take 100 mg by mouth every morning.   nitroGLYCERIN 2 % ointment Commonly known as: NITROGLYN Apply 1 inch topically every 8 (eight) hours as needed (hemorrhoids).   predniSONE 5 MG tablet Commonly known as: DELTASONE Take 5 mg by mouth daily.   prochlorperazine 10 MG tablet Commonly known as: COMPAZINE Take 1 tablet (10 mg total) by mouth every 6 (six) hours as needed for nausea or vomiting.   zolpidem 5 MG tablet Commonly known as: AMBIEN Take 5 mg by mouth at bedtime as needed.         ROS:  A comprehensive review of systems was negative except for: Respiratory: positive for cough and wheezing Gastrointestinal: positive for nausea and reflux symptoms Musculoskeletal: positive for back pain and joint pain Endocrine: positive for tired, sluggish  Blood pressure 130/82, pulse 82, temperature 97.8 F (36.6 C), temperature source Oral, resp. rate 14, height 5\' 1"  (1.549 m), weight 174 lb (78.9 kg), SpO2 90 %. Physical Exam Vitals reviewed.  Constitutional:      Appearance: She is  obese.  HENT:     Head: Normocephalic.     Nose: Nose normal.  Eyes:     Extraocular Movements: Extraocular movements intact.  Neck:     Comments: Supraclavicular fullness noted Cardiovascular:     Rate and Rhythm: Normal rate.  Pulmonary:     Effort: Pulmonary effort is normal.     Breath sounds: Wheezing present.  Abdominal:     General: There is no distension.     Palpations: Abdomen is soft.     Tenderness: There is no abdominal tenderness.  Musculoskeletal:        General: No swelling.  Skin:    General: Skin is warm.  Neurological:     General: No focal deficit present.     Mental Status: She is alert.  Psychiatric:        Mood and Affect: Mood normal.    Results:  Personally reviewed- chest neck- adenopathy left greater than right supraclavicular   CLINICAL DATA:  Left-sided neck swelling.  Rule out adenopathy   EXAM: CT NECK WITH CONTRAST   TECHNIQUE: Multidetector CT imaging of the neck was performed using the standard protocol following the bolus administration of intravenous contrast.   RADIATION DOSE REDUCTION: This exam was performed according to the departmental dose-optimization program which includes automated exposure control, adjustment of the mA and/or kV according to patient size and/or use of iterative reconstruction technique.   CONTRAST:  151mL OMNIPAQUE IOHEXOL 300 MG/ML  SOLN   COMPARISON:  CT angio neck 08/04/2008   FINDINGS: Pharynx and larynx: Normal. No mass or swelling.   Salivary glands: No inflammation, mass, or stone.   Thyroid: Negative   Lymph nodes: No enlarged lymph nodes in the upper neck. In the lower neck, there is a 11 mm lymph node posterior to the right clavicle. Posterior to the left clavicle there is a 13.5 mm lymph node and a 10 mm lymph node. Lateral to the thyroid on the left there is a 12 mm lymph node, and 8 mm lymph node.   Vascular: Normal vascular enhancement.   Limited intracranial: Negative  Visualized orbits: No orbital lesion.  Bilateral cataract extraction   Mastoids and visualized paranasal sinuses: Paranasal sinuses clear. Mastoid clear.   Skeleton: No acute skeletal abnormality.   Upper chest: Chest CT from today reported separately   Other: None   IMPRESSION: Cluster of mildly prominent lymph nodes in the lower neck bilaterally left greater than right. These have developed since the prior CT of 2010. These could be reactive lymph nodes or related to malignancy. Correlate with chest CT today reported separately.     Electronically Signed   By: Franchot Gallo M.D.   On: 05/26/2021 14:33  Assessment & Plan:  KABRINA CHRISTIANO is a 66 y.o. female with metastatic cancer of unknown origin. Getting chemotherapy.   Discussed port placement and risk of bleeding, infection, injury to vessels, malfunction, pneumothorax.  She took her plavix today. She will hold this starting now.   All questions were answered to the satisfaction of the patient and family.   Virl Cagey 07/06/2021, 9:40 AM

## 2021-07-06 NOTE — Patient Instructions (Signed)
Hold plavix starting now.  ? ?Implanted Port Insertion ?Implanted port insertion is a procedure to put in a port and catheter. The port is a device with an injectable disc that can be accessed by your health care provider. The port is connected to a vein in the chest or neck by a small, thin tube (catheter). There are different types of ports. The implanted port may be used as a long-term IV access for: ?Medicines, such as chemotherapy. ?Fluids. ?Liquid nutrition, such as total parenteral nutrition (TPN). ?When you have a port, your health care provider can choose to use the port instead of veins in your arms for these procedures. ?Tell a health care provider about: ?Any allergies you have. ?All medicines you are taking, especially blood thinners, as well as any vitamins, herbs, eye drops, creams, over-the-counter medicines, and steroids. ?Any problems you or family members have had with anesthetic medicines. ?Any bleeding problems you have. ?Any surgeries you have had. ?Any medical conditions you have or have had, including diabetes or kidney problems. ?Whether you are pregnant or may be pregnant. ?What are the risks? ?Generally, this is a safe procedure. However, problems may occur, including: ?Allergic reactions to medicines or dyes. ?Damage to other structures or organs. ?Infection. ?Damage to the blood vessel, bruising, or bleeding at the puncture site. ?Blood clot. ?Breakdown of the skin over the port. ?A collection of air in the chest that can cause one of the lungs to collapse (pneumothorax). This is rare. ?What happens before the procedure? ?medicines ?Ask your health care provider about: ?Changing or stopping your regular medicines. This is especially important if you are taking diabetes medicines or blood thinners. ?Taking medicines such as aspirin and ibuprofen. These medicines can thin your blood. Do not take these medicines unless your health care provider tells you to take them. ?Taking  over-the-counter medicines, vitamins, herbs, and supplements. ?General instructions ?If you will be going home right after the procedure, plan to have a responsible adult: ?Take you home from the hospital or clinic. You will not be allowed to drive. ?Care for you for the time you are told. ?You may have blood tests. ?Do not use any products that contain nicotine or tobacco for at least 4 weeks before the procedure. These products include cigarettes, chewing tobacco, and vaping devices, such as e-cigarettes. If you need help quitting, ask your health care provider. ?Ask your health care provider what steps will be taken to help prevent infection. These may include: ?Removing hair at the surgery site. ?Washing skin with a germ-killing soap. ?Taking antibiotic medicine. ?What happens during the procedure? ? ?An IV will be inserted into one of your veins. ?You will be given one or more of the following: ?A medicine to help you relax (sedative). ?A medicine to numb the area (local anesthetic). ?Two small incisions will be made to insert the port. ?One smaller incision will be made in your neck to get access to the vein where the catheter will lie. ?The other incision will be made in the upper chest. This is where the port will lie. ?The procedure may be done using continuous X-ray (fluoroscopy) or other imaging tools for guidance. ?The port and catheter will be placed. There may be a small, raised area where the port is placed. ?The port will be flushed with a saline solution, which is made of salt and water, and blood will be drawn to make sure that the port is working correctly. ?The incisions will be closed. ?Bandages (dressings)  may be placed over the incisions. ?The procedure may vary among health care providers and hospitals. ?What happens after the procedure? ?Your blood pressure, heart rate, breathing rate, and blood oxygen level will be monitored until you leave the hospital or clinic. ?If you were given a  sedative during the procedure, it can affect you for several hours. Do not drive or operate machinery until your health care provider says that it is safe. ?You will be given a manufacturer's information card for the type of port that you have. Keep this with you. ?Your port will need to be flushed and checked as told by your health care provider, usually every few weeks. ?A chest X-ray will be done to: ?Check the placement of the port. ?Make sure there is no injury to your lung. ?Summary ?Implanted port insertion is a procedure to put in a port and catheter. ?The implanted port is used as a long-term IV access. ?The port will need to be flushed and checked as told by your health care provider, usually every few weeks. ?Keep your manufacturer's information card with you at all times. ?This information is not intended to replace advice given to you by your health care provider. Make sure you discuss any questions you have with your health care provider. ?Document Revised: 08/23/2020 Document Reviewed: 08/23/2020 ?Elsevier Patient Education ? [place]. ? ?

## 2021-07-07 ENCOUNTER — Telehealth: Payer: Self-pay | Admitting: Gastroenterology

## 2021-07-07 NOTE — H&P (Signed)
[facility] History and Physical  Reason for Referral: Metastatic poorly differentiated carcinoma  Referring Physician: Dr. Delton Coombes, Oncology   Chief Complaint   New Patient (Initial Visit)     Brittney Martinez is a 66 y.o. female.  HPI: Brittney Martinez ia 66 yo with a poor differentiated metastatic carcinoma to her hilar nodes and her liver that is either a likely renal or gynecologic in origin based on Dr. Tomie China notes. She is here to get her port placed to initiate chemotherapy. She is here with family today. She had been having some right shoulder pain per their documentation but this resolved now she reports. She is right handed.   Past Medical History:  Diagnosis Date   Abdominal aortic stenosis    Anal fissure    Anxiety    Asthma    Complication of anesthesia    COPD (chronic obstructive pulmonary disease) (HCC)    Depression    grief   Diabetes mellitus    Diverticulitis    GERD (gastroesophageal reflux disease)    Glaucoma    Gout    Hypertension    IBS (irritable bowel syndrome)    PONV (postoperative nausea and vomiting)    RA (rheumatoid arthritis) ([place])    Sleep apnea    had one at one time but not anymore   Stroke ([place])    "mini-stroke"- no deficits   TIA (transient ischemic attack)    2010. No deficits    Past Surgical History:  Procedure Laterality Date   ABDOMINAL HYSTERECTOMY  1982   complete   BIOPSY  11/17/2018   Procedure: BIOPSY;  Surgeon: Daneil Dolin, MD;  [facility]: [facility];  Service: Endoscopy;;   BREAST LUMPECTOMY     right-benign   CATARACT EXTRACTION W/PHACO Right 05/13/2017   Procedure: CATARACT EXTRACTION PHACO AND INTRAOCULAR LENS PLACEMENT ([place]);  Surgeon: Tonny Branch, MD;  Location: [facility] ORS;  Service: Ophthalmology;  Laterality: Right;  CDE: 8.59   CATARACT EXTRACTION W/PHACO Left 05/27/2017   Procedure: CATARACT EXTRACTION WITH  PHACOEMULSIFICATION AND INTRAOCULAR LENS PLACEMENT LEFT EYE;  Surgeon: Tonny Branch, MD;  Location: [facility] OR[facility];  Service: Ophthalmology;  Laterality: Left;  CDE: 5.66   CHOLECYSTECTOMY  2003   COLONOSCOPY  12/14/09   Dr. Gala Romney :anal papilla and hemorrhoids,diminutive hperplastic rectal polyps/normal colon   COLONOSCOPY N/A 10/29/2013   PFX:TKWI papilla and internal hemorrhoids; colonic polyps-removed as described above. I suspect benign anorectal bleeding in the setting of hemorrhoids and  possibly fissure. tubular adenoma. next TCS 10/2020.   COLONOSCOPY WITH PROPOFOL N/A 11/17/2018   Dr. Gala Romney: Nonbleeding internal hemorrhoids, hemorrhoids were moderate and grade.  Exam otherwise normal.  Colonoscopy in 5 years.   ELBOW SURGERY     right   ESOPHAGOGASTRODUODENOSCOPY  12/14/09   Dr. Gala Romney :schatzkis ring 52F, otherwise normal   ESOPHAGOGASTRODUODENOSCOPY N/A 10/29/2013   RMR: Distal esophageal pseudodiverticulum/Nissen fundoplication   ESOPHAGOGASTRODUODENOSCOPY (EGD) WITH PROPOFOL N/A 11/17/2018   Dr. Gala Romney: Normal esophagus status post dilation.  Nonbleeding erosive gastropathy with benign biopsy.   FINGER SURGERY     on right middle, pointer, and index fingers   FISSURECTOMY     several   INCISIONAL HERNIA REPAIR N/A 03/22/2014   Procedure: [name] HERNIORRHAPHY;  Surgeon: Jamesetta So, MD;  Location: [facility];  Service: General;  Laterality: N/A;   INSERTION OF MESH N/A 03/22/2014   Procedure: INSERTION OF MESH;  Surgeon: Jamesetta So, MD;  Location: [facility];  Service: General;  Laterality: N/A;   MALONEY DILATION N/A 11/17/2018   Procedure: [name] DILATION;  Surgeon: Daneil Dolin, MD;  Location: [facility];  Service: Endoscopy;  Laterality: N/A;   NISSEN FUNDOPLICATION  5643   [place]   WRIST SURGERY     left    Family History  Problem Relation Age of Onset   Colon cancer Father 31   Hypertension Sister    Hypertension Brother    Kidney disease Brother     Social History   Tobacco Use   Smoking status: Former    Packs/day: 0.25    Years:  19.00    Pack years: 4.75    Types: Cigarettes    Quit date: 01/09/2007    Years since quitting: 14.4   Smokeless tobacco: Never  Vaping Use   Vaping Use: Never used  Substance Use Topics   Alcohol use: No   Drug use: No    Medications: I have reviewed the patient's current medications. Allergies as of 07/06/2021       Reactions   Lisinopril Anaphylaxis   Closing of throat   Aspirin Nausea Only   Propoxyphene N-acetaminophen Nausea And Vomiting   Zithromax [azithromycin] Nausea Only   Erythromycin Rash        Medication List        Accurate as of Jul 06, 2021  9:40 AM. If you have any questions, ask your nurse or doctor.          albuterol (2.5 MG/3ML) 0.083% nebulizer solution Commonly known as: PROVENTIL Take 2.5 mg by nebulization every 6 (six) hours as needed for wheezing or shortness of breath.   albuterol 108 (90 Base) MCG/ACT inhaler Commonly known as: VENTOLIN HFA Inhale 2 puffs into the lungs every 6 (six) hours as needed for wheezing or shortness of breath.   allopurinol 300 MG tablet Commonly known as: ZYLOPRIM Take 300 mg by mouth daily.   amLODipine 5 MG tablet Commonly known as: NORVASC Take 5 mg by mouth every morning.   aspirin 81 MG tablet Take 81 mg by mouth daily.   atorvastatin 10 MG tablet Commonly known as: LIPITOR Take 10 mg by mouth at bedtime.   budesonide-formoterol 160-4.5 MCG/ACT inhaler Commonly known as: SYMBICORT Inhale 2 puffs into the lungs 2 (two) times daily as needed.   calcium carbonate 600 MG Tabs tablet Commonly known as: OS-CAL Take 600 mg by mouth 2 (two) times daily with a meal.   clopidogrel 75 MG tablet Commonly known as: PLAVIX Take 75 mg by mouth daily. Is back on now   cycloSPORINE 0.05 % ophthalmic emulsion Commonly known as: RESTASIS Place 1 drop into both eyes 2 (two) times daily.   dexlansoprazole 60 MG capsule Commonly known as: DEXILANT Take 60 mg by mouth daily.   diphenhydrAMINE 25 MG  tablet Commonly known as: BENADRYL Take 25 mg by mouth daily as needed for allergies.   doxepin 75 MG capsule Commonly known as: SINEQUAN Take 75 mg by mouth at bedtime.   glipiZIDE 10 MG 24 hr tablet Commonly known as: GLUCOTROL XL Take 10 mg by mouth 2 (two) times daily.   hyoscyamine 0.125 MG SL tablet Commonly known as: LEVSIN SL Place 1 tablet (0.125 mg total) under the tongue every 6 (six) hours as needed for cramping.   ibuprofen 200 MG tablet Commonly known as: ADVIL Take 200-800 mg by mouth every 8 (eight) hours as needed for moderate pain.   Iron 325 (65 Fe) MG Tabs  Take by mouth.   lubiprostone 8 MCG capsule Commonly known as: Amitiza TAKE 1 CAPSULE BY MOUTH  ONCE DAILY WITH BREAKFAST   methocarbamol 500 MG tablet Commonly known as: ROBAXIN Take 500 mg by mouth every 6 (six) hours as needed.   metoprolol succinate 100 MG 24 hr tablet Commonly known as: TOPROL-XL Take 100 mg by mouth every morning.   nitroGLYCERIN 2 % ointment Commonly known as: NITROGLYN Apply 1 inch topically every 8 (eight) hours as needed (hemorrhoids).   predniSONE 5 MG tablet Commonly known as: DELTASONE Take 5 mg by mouth daily.   prochlorperazine 10 MG tablet Commonly known as: COMPAZINE Take 1 tablet (10 mg total) by mouth every 6 (six) hours as needed for nausea or vomiting.   zolpidem 5 MG tablet Commonly known as: AMBIEN Take 5 mg by mouth at bedtime as needed.         ROS:  A comprehensive review of systems was negative except for: Respiratory: positive for cough and wheezing Gastrointestinal: positive for nausea and reflux symptoms Musculoskeletal: positive for back pain and joint pain Endocrine: positive for tired, sluggish  Blood pressure 130/82, pulse 82, temperature 97.8 F (36.6 C), temperature source Oral, resp. rate 14, height 5\' 1"  (1.549 m), weight 174 lb (78.9 kg), SpO2 90 %. Physical Exam Vitals reviewed.  Constitutional:      Appearance: She is  obese.  HENT:     Head: Normocephalic.     Nose: Nose normal.  Eyes:     Extraocular Movements: Extraocular movements intact.  Neck:     Comments: Supraclavicular fullness noted Cardiovascular:     Rate and Rhythm: Normal rate.  Pulmonary:     Effort: Pulmonary effort is normal.     Breath sounds: Wheezing present.  Abdominal:     General: There is no distension.     Palpations: Abdomen is soft.     Tenderness: There is no abdominal tenderness.  Musculoskeletal:        General: No swelling.  Skin:    General: Skin is warm.  Neurological:     General: No focal deficit present.     Mental Status: She is alert.  Psychiatric:        Mood and Affect: Mood normal.    Results:  Personally reviewed- chest neck- adenopathy left greater than right supraclavicular   CLINICAL DATA:  Left-sided neck swelling.  Rule out adenopathy   EXAM: CT NECK WITH CONTRAST   TECHNIQUE: Multidetector CT imaging of the neck was performed using the standard protocol following the bolus administration of intravenous contrast.   RADIATION DOSE REDUCTION: This exam was performed according to the departmental dose-optimization program which includes automated exposure control, adjustment of the mA and/or kV according to patient size and/or use of iterative reconstruction technique.   CONTRAST:  161mL OMNIPAQUE IOHEXOL 300 MG/ML  SOLN   COMPARISON:  CT angio neck 08/04/2008   FINDINGS: Pharynx and larynx: Normal. No mass or swelling.   Salivary glands: No inflammation, mass, or stone.   Thyroid: Negative   Lymph nodes: No enlarged lymph nodes in the upper neck. In the lower neck, there is a 11 mm lymph node posterior to the right clavicle. Posterior to the left clavicle there is a 13.5 mm lymph node and a 10 mm lymph node. Lateral to the thyroid on the left there is a 12 mm lymph node, and 8 mm lymph node.   Vascular: Normal vascular enhancement.   Limited intracranial: Negative  Visualized orbits: No orbital lesion.  Bilateral cataract extraction   Mastoids and visualized paranasal sinuses: Paranasal sinuses clear. Mastoid clear.   Skeleton: No acute skeletal abnormality.   Upper chest: Chest CT from today reported separately   Other: None   IMPRESSION: Cluster of mildly prominent lymph nodes in the lower neck bilaterally left greater than right. These have developed since the prior CT of 2010. These could be reactive lymph nodes or related to malignancy. Correlate with chest CT today reported separately.     Electronically Signed   By: Franchot Gallo M.D.   On: 05/26/2021 14:33  Assessment & Plan:  LABELLA ZAHRADNIK is a 66 y.o. female with metastatic cancer of unknown origin. Getting chemotherapy.   Discussed port placement and risk of bleeding, infection, injury to vessels, malfunction, pneumothorax.  She took her plavix today. She will hold this starting now.   All questions were answered to the satisfaction of the patient and family.   Virl Cagey 07/06/2021, 9:40 AM

## 2021-07-07 NOTE — Telephone Encounter (Signed)
Please let patient know I reviewed her case with Dr. Delton Coombes.  He did not recommend an EGD or early interval colonoscopy as her PET scan did not show any uptake in these areas. ? ?I also received her labs from November completed with her PCP.  Her hemoglobin was normal at that time though she did have low MCH and MCHC.  Iron levels were not checked.  ? ?Reviewed her most recent labs completed 06/29/2021.  Her hemoglobin was 15.2 and her MCH and MCHC were within normal limits. ? ?She can likely stop her iron supplementation, but she should discuss with her PCP first who initially recommended starting iron. ? ?We can plan to follow-up in 6 months or sooner if needed.  ? ?Stacey: Please arrange 6 month follow-up.  ?

## 2021-07-10 ENCOUNTER — Encounter: Payer: Self-pay | Admitting: Internal Medicine

## 2021-07-10 ENCOUNTER — Encounter (HOSPITAL_COMMUNITY)
Admission: RE | Admit: 2021-07-10 | Discharge: 2021-07-10 | Disposition: A | Discharge status: Home / Self Care | Payer: Medicare Other | Source: Ambulatory Visit | Attending: General Surgery | Admitting: General Surgery

## 2021-07-10 NOTE — Telephone Encounter (Signed)
Spoke to pt, informed her of results and recommendations. Pt voiced understanding.  

## 2021-07-12 ENCOUNTER — Ambulatory Visit (HOSPITAL_COMMUNITY): Payer: Medicare Other | Admitting: Certified Registered Nurse Anesthetist

## 2021-07-12 ENCOUNTER — Encounter (HOSPITAL_COMMUNITY): Payer: Self-pay | Admitting: General Surgery

## 2021-07-12 ENCOUNTER — Other Ambulatory Visit: Payer: Self-pay

## 2021-07-12 ENCOUNTER — Ambulatory Visit (HOSPITAL_COMMUNITY): Payer: Medicare Other

## 2021-07-12 ENCOUNTER — Ambulatory Visit (HOSPITAL_BASED_OUTPATIENT_CLINIC_OR_DEPARTMENT_OTHER): Payer: Medicare Other | Admitting: Certified Registered Nurse Anesthetist

## 2021-07-12 ENCOUNTER — Encounter (HOSPITAL_COMMUNITY)
Admission: RE | Disposition: A | Discharge status: Home / Self Care | Payer: Self-pay | Source: Home / Self Care | Attending: General Surgery

## 2021-07-12 ENCOUNTER — Ambulatory Visit (HOSPITAL_COMMUNITY)
Admission: RE | Admit: 2021-07-12 | Discharge: 2021-07-12 | Disposition: A | Discharge status: Home / Self Care | Payer: Medicare Other | Attending: General Surgery | Admitting: General Surgery

## 2021-07-12 DIAGNOSIS — C787 Secondary malignant neoplasm of liver and intrahepatic bile duct: Secondary | ICD-10-CM

## 2021-07-12 DIAGNOSIS — C801 Malignant (primary) neoplasm, unspecified: Secondary | ICD-10-CM

## 2021-07-12 DIAGNOSIS — C781 Secondary malignant neoplasm of mediastinum: Secondary | ICD-10-CM | POA: Diagnosis not present

## 2021-07-12 DIAGNOSIS — J449 Chronic obstructive pulmonary disease, unspecified: Secondary | ICD-10-CM | POA: Diagnosis not present

## 2021-07-12 DIAGNOSIS — Z452 Encounter for adjustment and management of vascular access device: Secondary | ICD-10-CM | POA: Diagnosis not present

## 2021-07-12 DIAGNOSIS — I700 Atherosclerosis of aorta: Secondary | ICD-10-CM | POA: Diagnosis not present

## 2021-07-12 DIAGNOSIS — R160 Hepatomegaly, not elsewhere classified: Secondary | ICD-10-CM | POA: Insufficient documentation

## 2021-07-12 HISTORY — PX: PORTACATH PLACEMENT: SHX2246

## 2021-07-12 LAB — GLUCOSE, CAPILLARY
Glucose-Capillary: 121 mg/dL — ABNORMAL HIGH (ref 70–99)
Glucose-Capillary: 31 mg/dL — CL (ref 70–99)
Glucose-Capillary: 115 mg/dL — ABNORMAL HIGH (ref 70–99)

## 2021-07-12 SURGERY — INSERTION, TUNNELED CENTRAL VENOUS DEVICE, WITH PORT
Anesthesia: General | Site: Chest | Laterality: Left

## 2021-07-12 MED ORDER — MIDAZOLAM HCL 2 MG/2ML IJ SOLN
INTRAMUSCULAR | Status: AC
  Filled 2021-07-12: qty 2

## 2021-07-12 MED ORDER — LACTATED RINGERS IV SOLN: INTRAVENOUS | Status: DC | PRN

## 2021-07-12 MED ORDER — PHENYLEPHRINE 80 MCG/ML (10ML) SYRINGE FOR IV PUSH (FOR BLOOD PRESSURE SUPPORT)
PREFILLED_SYRINGE | INTRAVENOUS | Status: AC
  Filled 2021-07-12: qty 10

## 2021-07-12 MED ORDER — PROPOFOL 10 MG/ML IV BOLUS
INTRAVENOUS | Status: AC
  Filled 2021-07-12: qty 20

## 2021-07-12 MED ORDER — CEFAZOLIN SODIUM-DEXTROSE 2-4 GM/100ML-% IV SOLN: 2.0000 g | INTRAVENOUS | Status: DC

## 2021-07-12 MED ORDER — MIDAZOLAM HCL 2 MG/2ML IJ SOLN
INTRAMUSCULAR | Status: DC | PRN
Start: 2021-07-12 — End: 2021-07-12
  Administered 2021-07-12: 1 mg via INTRAVENOUS

## 2021-07-12 MED ORDER — DEXTROSE 50 % IV SOLN
INTRAVENOUS | Status: AC
  Administered 2021-07-12: 50 mL
  Filled 2021-07-12: qty 50

## 2021-07-12 MED ORDER — LIDOCAINE HCL (PF) 1 % IJ SOLN
INTRAMUSCULAR | Status: AC
  Filled 2021-07-12: qty 30

## 2021-07-12 MED ORDER — ONDANSETRON HCL 4 MG/2ML IJ SOLN
INTRAMUSCULAR | Status: AC
  Filled 2021-07-12: qty 2

## 2021-07-12 MED ORDER — HEPARIN SOD (PORK) LOCK FLUSH 100 UNIT/ML IV SOLN
INTRAVENOUS | Status: DC | PRN
  Administered 2021-07-12: 500 [IU] via INTRAVENOUS

## 2021-07-12 MED ORDER — CHLORHEXIDINE GLUCONATE CLOTH 2 % EX PADS: 6.0000 | MEDICATED_PAD | Freq: Once | CUTANEOUS | Status: DC

## 2021-07-12 MED ORDER — PROPOFOL 500 MG/50ML IV EMUL
INTRAVENOUS | Status: DC | PRN
  Administered 2021-07-12: 50 ug/kg/min via INTRAVENOUS

## 2021-07-12 MED ORDER — PROPOFOL 10 MG/ML IV BOLUS
INTRAVENOUS | Status: DC | PRN
  Administered 2021-07-12: 20 mg via INTRAVENOUS

## 2021-07-12 MED ORDER — CHLORHEXIDINE GLUCONATE 0.12 % MT SOLN
OROMUCOSAL | Status: DC
Start: 2021-07-12 — End: 2021-07-12
  Filled 2021-07-12: qty 15

## 2021-07-12 MED ORDER — FENTANYL CITRATE (PF) 100 MCG/2ML IJ SOLN
INTRAMUSCULAR | Status: AC
  Filled 2021-07-12: qty 2

## 2021-07-12 MED ORDER — SODIUM CHLORIDE (PF) 0.9 % IJ SOLN
INTRAMUSCULAR | Status: DC | PRN
  Administered 2021-07-12: 500 mL via INTRAVENOUS

## 2021-07-12 MED ORDER — PHENYLEPHRINE HCL (PRESSORS) 10 MG/ML IV SOLN
INTRAVENOUS | Status: DC | PRN
  Administered 2021-07-12: 80 ug via INTRAVENOUS
  Administered 2021-07-12: 60 ug via INTRAVENOUS

## 2021-07-12 MED ORDER — CEFAZOLIN SODIUM-DEXTROSE 2-4 GM/100ML-% IV SOLN
INTRAVENOUS | Status: AC
  Filled 2021-07-12: qty 100

## 2021-07-12 MED ORDER — FENTANYL CITRATE (PF) 100 MCG/2ML IJ SOLN
INTRAMUSCULAR | Status: DC | PRN
  Administered 2021-07-12: 50 ug via INTRAVENOUS

## 2021-07-12 MED ORDER — LIDOCAINE HCL (PF) 1 % IJ SOLN
INTRAMUSCULAR | Status: DC | PRN
  Administered 2021-07-12: 12 mL

## 2021-07-12 MED ORDER — HEPARIN SOD (PORK) LOCK FLUSH 100 UNIT/ML IV SOLN
INTRAVENOUS | Status: AC
  Filled 2021-07-12: qty 5

## 2021-07-12 MED ORDER — DEXTROSE 50 % IV SOLN
50.0000 mL | Freq: Once | INTRAVENOUS | Status: DC
Start: 2021-07-12 — End: 2021-07-12

## 2021-07-12 SURGICAL SUPPLY — 34 items
ADH SKN CLS APL DERMABOND .7 (GAUZE/BANDAGES/DRESSINGS) ×1
APL PRP STRL LF ISPRP CHG 10.5 (MISCELLANEOUS) ×1
APPLICATOR CHLORAPREP 10.5 ORG (MISCELLANEOUS) ×3 IMPLANT
BAG DECANTER FOR FLEXI CONT (MISCELLANEOUS) ×3 IMPLANT
CLOTH BEACON ORANGE TIMEOUT ST (SAFETY) ×3 IMPLANT
COVER LIGHT HANDLE STERIS (MISCELLANEOUS) ×6 IMPLANT
DECANTER SPIKE VIAL GLASS SM (MISCELLANEOUS) ×3 IMPLANT
DERMABOND ADVANCED (GAUZE/BANDAGES/DRESSINGS) ×1
DERMABOND ADVANCED .7 DNX12 (GAUZE/BANDAGES/DRESSINGS) ×2 IMPLANT
DRAPE C-ARM FOLDED MOBILE STRL (DRAPES) ×3 IMPLANT
ELECT REM PT RETURN 9FT ADLT (ELECTROSURGICAL) ×2
ELECTRODE REM PT RTRN 9FT ADLT (ELECTROSURGICAL) ×2 IMPLANT
GLOVE BIO SURGEON STRL SZ 6.5 (GLOVE) ×3 IMPLANT
GLOVE BIOGEL PI IND STRL 6.5 (GLOVE) ×2 IMPLANT
GLOVE BIOGEL PI IND STRL 7.0 (GLOVE) ×4 IMPLANT
GLOVE BIOGEL PI INDICATOR 6.5 (GLOVE) ×1
GLOVE BIOGEL PI INDICATOR 7.0 (GLOVE) ×2
GOWN STRL REUS W/TWL LRG LVL3 (GOWN DISPOSABLE) ×6 IMPLANT
IV NS 500ML (IV SOLUTION) ×2
IV NS 500ML BAXH (IV SOLUTION) ×2 IMPLANT
KIT PORT POWER 8FR ISP MRI (Port) ×3 IMPLANT
KIT TURNOVER KIT A (KITS) ×3 IMPLANT
MANIFOLD NEPTUNE II (INSTRUMENTS) ×3 IMPLANT
NDL HYPO 25X1 1.5 SAFETY (NEEDLE) ×2 IMPLANT
NEEDLE HYPO 25X1 1.5 SAFETY (NEEDLE) ×2 IMPLANT
PACK MINOR (CUSTOM PROCEDURE TRAY) ×3 IMPLANT
PAD ARMBOARD 7.5X6 YLW CONV (MISCELLANEOUS) ×3 IMPLANT
SET BASIN LINEN APH (SET/KITS/TRAYS/PACK) ×3 IMPLANT
SUT MNCRL AB 4-0 PS2 18 (SUTURE) ×3 IMPLANT
SUT PROLENE 2 0 SH 30 (SUTURE) ×3 IMPLANT
SUT VIC AB 3-0 SH 27 (SUTURE) ×2
SUT VIC AB 3-0 SH 27X BRD (SUTURE) ×2 IMPLANT
SYR 10ML LL (SYRINGE) ×6 IMPLANT
SYR CONTROL 10ML LL (SYRINGE) ×3 IMPLANT

## 2021-07-12 NOTE — Op Note (Signed)
Operative Note ?07/12/21 ?  ?Preoperative Diagnosis: Mestastatic cancer to the liver  ?  ?Postoperative Diagnosis: Same ?  ?Procedure(s) Performed: Port-A-Cath placement, left subclavian  ?  ?Surgeon: Lanell Matar. Constance Haw, MD ?  ?Assistants: No qualified resident was available ?  ?Anesthesia: Monitored anesthesia care ?  ?Anesthesiologist: Dr. Briant Cedar ?  ?Specimens: None ?  ?Estimated Blood Loss: Minimal  ? ?Fluoroscopy time: 23 seconds ?  ?Blood Replacement: None  ?  ?Complications: None  ?  ?Operative Findings: Normal anatomy ? ?Indications: Ms. Mareno is a 66 yo with liver metastasis of unknown origin but thought to be renal or gyn in nature. She is going to start chemotherapy. We discussed port placement and risk of bleeding, infection, injury to vessels, pneumothorax, malfunction, CXR and [place] use. She opted to proceed. ? ?Procedure: The patient was brought into the operating room and monitored anesthesia care was induced.  One percent lidocaine was used for local anesthesia.   The bilateral chest and neck was prepped and draped in the usual sterile fashion.  Preoperative antibiotics were given.  An incision was made below the left clavicle. A subcutaneous pocket was formed. The needles advanced into the left subclavian vein using the Seldinger technique without difficulty. A guidewire was then advanced into the right atrium under fluoroscopic guidance.  Ectopia was not noted. An introducer and peel-away sheath were placed over the guidewire. The catheter was then inserted through the peel-away sheath and the peel-away sheath was removed.  A spot film was performed to confirm the position. The catheter was then attached to the port and the port placed in subcutaneous pocket. Adequate positioning was confirmed by fluoroscopy. Hemostasis was confirmed, and the port was secured with 2-0 prolene sutures.  Good backflow of blood was noted on aspiration of the port. The port was flushed with heparin flush. Subcutaneous layer  was reapproximated using a 3-0 Vicryl interrupted suture. The skin was closed using a 4-0 Vicryl subcuticular suture. Dermabond was applied. ?   ?All tape and needle counts were correct at the end of the procedure. The patient was transferred to PACU in stable condition. A chest x-ray will be performed at that time. ? ?[name], MD ?[facility] ?[address] [place], [place] [address] ?[phone] (office) ? ? ?  ?

## 2021-07-12 NOTE — Transfer of Care (Signed)
Immediate Anesthesia Transfer of Care Note ? ?Patient: Brittney Martinez ? ?Procedure(s) Performed: INSERTION PORT-A-CATH (Left: Chest) ? ?Patient Location: PACU ? ?Anesthesia Type:General ? ?Level of Consciousness: awake ? ?Airway & Oxygen Therapy: Patient Spontanous Breathing ? ?Post-op Assessment: Report given to RN ? ?Post vital signs: Reviewed and stable ? ?Last Vitals:  ?Vitals Value Taken Time  ?BP    ?Temp    ?Pulse 81 07/12/21 1120  ?Resp 18 07/12/21 1121  ?SpO2 100 % 07/12/21 1120  ?Vitals shown include unvalidated device data. ? ?Last Pain: There were no vitals filed for this visit.   ? ?  ? ?Complications: No notable events documented. ?

## 2021-07-12 NOTE — Discharge Instructions (Addendum)
Restart Plavix 07/14/2021.  ?Keep area clean and dry. You can take a shower in 24 hours. Do not submerge in water.  ?Take tylenol and ibuprofen for pain control and take your home tramadol for severe pain. ? ?

## 2021-07-12 NOTE — Progress Notes (Signed)
[facility] ? ?CXR looks good, no PTX catheter in good position. ? ?[name], MD

## 2021-07-12 NOTE — Progress Notes (Signed)
[facility] ? ?Updated family. Plavix restart 07/14/2021. CXR ordered.  ? ?[name], MD ?[facility] ?[place], [place] [address] ?[phone] (office) ? ?

## 2021-07-12 NOTE — Anesthesia Preprocedure Evaluation (Signed)
Anesthesia Evaluation  ?Patient identified by MRN, date of birth, ID band ?Patient awake ? ? ? ?Reviewed: ?Allergy & Precautions, H&P , NPO status , Patient's Chart, lab work & pertinent test results, reviewed documented beta blocker date and time  ? ?History of Anesthesia Complications ?(+) PONV and history of anesthetic complications ? ?Airway ?Mallampati: II ? ?TM Distance: >3 FB ?Neck ROM: full ? ? ? Dental ?no notable dental hx. ? ?  ?Pulmonary ?asthma , sleep apnea , COPD, Patient abstained from smoking., former smoker,  ?  ?Pulmonary exam normal ?breath sounds clear to auscultation ? ? ? ? ? ? Cardiovascular ?Exercise Tolerance: Good ?hypertension, negative cardio ROS ? ? ?Rhythm:regular Rate:Normal ? ? ?  ?Neuro/Psych ?PSYCHIATRIC DISORDERS Anxiety Depression TIANo Residual Symptoms   ? GI/Hepatic ?Neg liver ROS, GERD  Medicated,  ?Endo/Other  ?negative endocrine ROSdiabetes ? Renal/GU ?negative Renal ROS  ?negative genitourinary ?  ?Musculoskeletal ? ? Abdominal ?  ?Peds ? Hematology ?negative hematology ROS ?(+)   ?Anesthesia Other Findings ? ? Reproductive/Obstetrics ?negative OB ROS ? ?  ? ? ? ? ? ? ? ? ? ? ? ? ? ?  ?  ? ? ? ? ? ? ? ? ?Anesthesia Physical ?Anesthesia Plan ? ?ASA: 2 ? ?Anesthesia Plan: General  ? ?Post-op Pain Management:   ? ?Induction:  ? ?PONV Risk Score and Plan: Propofol infusion ? ?Airway Management Planned:  ? ?Additional Equipment:  ? ?Intra-op Plan:  ? ?Post-operative Plan:  ? ?Informed Consent: I have reviewed the patients History and Physical, chart, labs and discussed the procedure including the risks, benefits and alternatives for the proposed anesthesia with the patient or authorized representative who has indicated his/her understanding and acceptance.  ? ? ? ?Dental Advisory Given ? ?Plan Discussed with: CRNA ? ?Anesthesia Plan Comments:   ? ? ? ? ? ? ?Anesthesia Quick Evaluation ? ?

## 2021-07-12 NOTE — Interval H&P Note (Signed)
History and Physical Interval Note: ? ?07/12/2021 ?10:15 AM ? ?Brittney Martinez  has presented today for surgery, with the diagnosis of MEDIASTINAL ADENOPATHY ?LIVER MASS.  The various methods of treatment have been discussed with the patient and family. After consideration of risks, benefits and other options for treatment, the patient has consented to  Procedure(s): ?INSERTION PORT-A-CATH (Left) as a surgical intervention.  The patient's history has been reviewed, patient examined, no change in status, stable for surgery.  I have reviewed the patient's chart and labs.  Questions were answered to the patient's satisfaction.   ? ? ?Virl Cagey ? ? ?

## 2021-07-12 NOTE — Progress Notes (Signed)
Call to Son Verlon Carcione voicemail at [phone].  Left message to call short stay regarding his mother.   ?

## 2021-07-13 NOTE — Progress Notes (Addendum)
Out of sequence. Post op call to son Saragrace Selke at [phone]. Son verified he returned to hospital and picked up his mothers grey jewelry bracelet after leaving it at the hospital.   Call placed postoperatively on 07/13/2021 at 9:21am. ?

## 2021-07-13 NOTE — Anesthesia Postprocedure Evaluation (Signed)
Anesthesia Post Note ? ?Patient: Brittney Martinez ? ?Procedure(s) Performed: INSERTION PORT-A-CATH (Left: Chest) ? ?Patient location during evaluation: Phase II ?Anesthesia Type: General ?Level of consciousness: awake ?Pain management: pain level controlled ?Vital Signs Assessment: post-procedure vital signs reviewed and stable ?Respiratory status: spontaneous breathing and respiratory function stable ?Cardiovascular status: blood pressure returned to baseline and stable ?Postop Assessment: no headache and no apparent nausea or vomiting ?Anesthetic complications: no ?Comments: Late entry ? ? ?No notable events documented. ? ? ?Last Vitals:  ?Vitals:  ? 07/12/21 1145 07/12/21 1215  ?BP: 130/68 121/70  ?Pulse: 79 77  ?Resp: 15 17  ?Temp:  36.7 ?C  ?SpO2: 91% 94%  ?  ?Last Pain:  ?Vitals:  ? 07/12/21 1215  ?TempSrc: Oral  ?PainSc: 3   ? ? ?  ?  ?  ?  ?  ?  ? ?Louann Sjogren ? ? ? ? ?

## 2021-07-14 ENCOUNTER — Encounter (HOSPITAL_COMMUNITY): Payer: Self-pay | Admitting: General Surgery

## 2021-07-19 ENCOUNTER — Encounter (HOSPITAL_COMMUNITY): Payer: Self-pay

## 2021-07-20 ENCOUNTER — Inpatient Hospital Stay (HOSPITAL_COMMUNITY): Payer: Medicare Other | Attending: Hematology | Admitting: Hematology

## 2021-07-20 VITALS — BP 123/75 | HR 103 | Temp 97.4°F | Resp 18 | Ht 61.0 in | Wt 174.3 lb

## 2021-07-20 DIAGNOSIS — Z79899 Other long term (current) drug therapy: Secondary | ICD-10-CM | POA: Diagnosis not present

## 2021-07-20 DIAGNOSIS — R160 Hepatomegaly, not elsewhere classified: Secondary | ICD-10-CM | POA: Diagnosis not present

## 2021-07-20 DIAGNOSIS — R590 Localized enlarged lymph nodes: Secondary | ICD-10-CM | POA: Diagnosis not present

## 2021-07-20 DIAGNOSIS — Z87891 Personal history of nicotine dependence: Secondary | ICD-10-CM | POA: Diagnosis not present

## 2021-07-20 DIAGNOSIS — Z7984 Long term (current) use of oral hypoglycemic drugs: Secondary | ICD-10-CM | POA: Diagnosis not present

## 2021-07-20 NOTE — Patient Instructions (Addendum)
[place] at [facility] Discharge Instructions   You were seen and examined today by Dr. Delton Coombes.  He discussed the results of the special testing. Unfortunately there were not enough cancer cells present in the sample to run the testing. We will repeat a biopsy of one of your lymph nodes under your collarbone to get this testing done.   We will see you back after we get these results.    Thank you for choosing [place] at [facility] to provide your oncology and hematology care.  To afford each patient quality time with our provider, please arrive at least 15 minutes before your scheduled appointment time.   If you have a lab appointment with the [facility] please come in thru the Main Entrance and check in at the main information desk.  You need to re-schedule your appointment should you arrive 10 or more minutes late.  We strive to give you quality time with our providers, and arriving late affects you and other patients whose appointments are after yours.  Also, if you no show three or more times for appointments you may be dismissed from the clinic at the providers discretion.     Again, thank you for choosing [facility].  Our hope is that these requests will decrease the amount of time that you wait before being seen by our physicians.       _____________________________________________________________  Should you have questions after your visit to [facility], please contact our office at [phone] and follow the prompts.  Our office hours are 8:00 a.m. and 4:30 p.m. Monday - Friday.  Please note that voicemails left after 4:00 p.m. may not be returned until the following business day.  We are closed weekends and major holidays.  You do have access to a nurse 24-7, just call the main number to the clinic [phone] and do not press any options, hold on the line and a nurse will answer the phone.     For prescription refill requests, have your pharmacy contact our office and allow 72 hours.    Due to Covid, you will need to wear a mask upon entering the hospital. If you do not have a mask, a mask will be given to you at the Main Entrance upon arrival. For doctor visits, patients may have 1 support person age 69 or older with them. For treatment visits, patients can not have anyone with them due to social distancing guidelines and our immunocompromised population.

## 2021-07-20 NOTE — Progress Notes (Signed)
Brittney Martinez, Brittney Martinez [id number]   CLINIC:  Medical Oncology/Hematology  PCP:  Glenda Chroman, MD [address] / [place] [address] [phone]   REASON FOR VISIT:  Follow-up for liver masses/mediastinal and left supraclavicular adenopathy  PRIOR THERAPY: none  NGS Results: not done  CURRENT THERAPY: under work-up  BRIEF ONCOLOGIC HISTORY:  Oncology History   No history exists.    CANCER STAGING:  Cancer Staging  No matching staging information was found for the patient.  INTERVAL HISTORY:  Brittney Martinez, a 67 y.o. female, returns for routine follow-up of her liver masses/mediastinal and left supraclavicular adenopathy. Brittney Martinez was last seen on 06/29/2021.   Today she reports feeling good. She reports tenderness on her shoulder when palpated, but she otherwise denies neck pains. She reports swelling of her left hand.   REVIEW OF SYSTEMS:  Review of Systems  Constitutional:  Negative for appetite change and fatigue.  Respiratory:  Positive for cough.   Musculoskeletal:  Negative for neck pain.  Psychiatric/Behavioral:  Positive for sleep disturbance.   All other systems reviewed and are negative.  PAST MEDICAL/SURGICAL HISTORY:  Past Medical History:  Diagnosis Date   Abdominal aortic stenosis    Anal fissure    Anxiety    Asthma    Complication of anesthesia    COPD (chronic obstructive pulmonary disease) ([place])    Depression    grief   Diabetes mellitus    Diverticulitis    GERD (gastroesophageal reflux disease)    Glaucoma    Gout    Hypertension    IBS (irritable bowel syndrome)    PONV (postoperative nausea and vomiting)    RA (rheumatoid arthritis) ([place])    Sleep apnea    had one at one time but not anymore   Stroke ([place])    "mini-stroke"- no deficits   TIA (transient ischemic attack)    2010. No deficits   Past Surgical History:  Procedure Laterality Date   ABDOMINAL HYSTERECTOMY  1982   complete    BIOPSY  11/17/2018   Procedure: BIOPSY;  Surgeon: Daneil Dolin, MD;  [facility]: [facility];  Service: Endoscopy;;   BREAST LUMPECTOMY     right-benign   CATARACT EXTRACTION W/PHACO Right 05/13/2017   Procedure: CATARACT EXTRACTION PHACO AND INTRAOCULAR LENS PLACEMENT ([place]);  Surgeon: Tonny Branch, MD;  Location: [facility];  Service: Ophthalmology;  Laterality: Right;  CDE: 8.59   CATARACT EXTRACTION W/PHACO Left 05/27/2017   Procedure: CATARACT EXTRACTION WITH  PHACOEMULSIFICATION AND INTRAOCULAR LENS PLACEMENT LEFT EYE;  Surgeon: Tonny Branch, MD;  Location: [facility];  Service: Ophthalmology;  Laterality: Left;  CDE: 5.66   CHOLECYSTECTOMY  2003   COLONOSCOPY  12/14/09   Dr. Gala Romney :anal papilla and hemorrhoids,diminutive hperplastic rectal polyps/normal colon   COLONOSCOPY N/A 10/29/2013   ZTI:WPYK papilla and internal hemorrhoids; colonic polyps-removed as described above. I suspect benign anorectal bleeding in the setting of hemorrhoids and  possibly fissure. tubular adenoma. next TCS 10/2020.   COLONOSCOPY WITH PROPOFOL N/A 11/17/2018   Dr. Gala Romney: Nonbleeding internal hemorrhoids, hemorrhoids were moderate and grade.  Exam otherwise normal.  Colonoscopy in 5 years.   ELBOW SURGERY     right   ESOPHAGOGASTRODUODENOSCOPY  12/14/09   Dr. Gala Romney :schatzkis ring 60F, otherwise normal   ESOPHAGOGASTRODUODENOSCOPY N/A 10/29/2013   RMR: Distal esophageal pseudodiverticulum/Nissen fundoplication   ESOPHAGOGASTRODUODENOSCOPY (EGD) WITH PROPOFOL N/A 11/17/2018   Dr. Gala Romney: Normal esophagus status post  dilation.  Nonbleeding erosive gastropathy with benign biopsy.   FINGER SURGERY     on right middle, pointer, and index fingers   FISSURECTOMY     several   INCISIONAL HERNIA REPAIR N/A 03/22/2014   Procedure: [name] HERNIORRHAPHY;  Surgeon: Jamesetta So, MD;  Location: [facility];  Service: General;  Laterality: N/A;   INSERTION OF MESH N/A 03/22/2014   Procedure: INSERTION OF MESH;  Surgeon: Jamesetta So, MD;  Location: [facility];  Service: General;  Laterality: N/A;   MALONEY DILATION N/A 11/17/2018   Procedure: [name] DILATION;  Surgeon: Daneil Dolin, MD;  Location: [facility];  Service: Endoscopy;  Laterality: N/A;   NISSEN FUNDOPLICATION  [id number]   DeMason Taylor   PORTACATH PLACEMENT Left 07/12/2021   Procedure: INSERTION PORT-A-CATH;  Surgeon: Virl Cagey, MD;  Location: [facility];  Service: General;  Laterality: Left;   WRIST SURGERY     left    SOCIAL HISTORY:  Social History   Socioeconomic History   Marital status: Widowed    Spouse name: Not on file   Number of children: Not on file   Years of education: Not on file   Highest education level: Not on file  Occupational History   Occupation: disabled  Tobacco Use   Smoking status: Former    Packs/day: 0.25    Years: 19.00    Pack years: 4.75    Types: Cigarettes    Quit date: 01/09/2007    Years since quitting: 14.5   Smokeless tobacco: Never  Vaping Use   Vaping Use: Never used  Substance and Sexual Activity   Alcohol use: No   Drug use: No   Sexual activity: Never  Other Topics Concern   Not on file  Social History Narrative   1 son-healthy   1 daughter-MVA (drunk-driver)   Social Determinants of Health   Financial Resource Strain: Not on file  Food Insecurity: Not on file  Transportation Needs: Not on file  Physical Activity: Not on file  Stress: Not on file  Social Connections: Not on file  Intimate Partner Violence: Not on file    FAMILY HISTORY:  Family History  Problem Relation Age of Onset   Colon cancer Father 32   Hypertension Sister    Hypertension Brother    Kidney disease Brother     CURRENT MEDICATIONS:  Current Outpatient Medications  Medication Sig Dispense Refill   albuterol (PROVENTIL HFA;VENTOLIN HFA) 108 (90 BASE) MCG/ACT inhaler Inhale 2 puffs into the lungs every 6 (six) hours as needed for wheezing or shortness of breath.     albuterol (PROVENTIL) (2.5 MG/3ML)  0.083% nebulizer solution Take 2.5 mg by nebulization every 6 (six) hours as needed for wheezing or shortness of breath.     allopurinol (ZYLOPRIM) 300 MG tablet Take 300 mg by mouth daily.     amLODipine (NORVASC) 5 MG tablet Take 5 mg by mouth every morning.      aspirin 81 MG tablet Take 81 mg by mouth daily.     atorvastatin (LIPITOR) 20 MG tablet Take 20 mg by mouth at bedtime.     Biotin w/ Vitamins C & E (HAIR/SKIN/NAILS PO) Take 1 tablet by mouth daily.     budesonide-formoterol (SYMBICORT) 160-4.5 MCG/ACT inhaler Inhale 2 puffs into the lungs every 3 (three) days.     cholecalciferol (VITAMIN D3) 25 MCG (1000 UNIT) tablet Take 1,000 Units by mouth daily.     clopidogrel (PLAVIX) 75 MG  tablet Take 75 mg by mouth daily.     cycloSPORINE (RESTASIS) 0.05 % ophthalmic emulsion Place 1 drop into both eyes 2 (two) times daily.     diclofenac Sodium (VOLTAREN) 1 % GEL Apply 2 g topically daily as needed (arthritis pain).     diphenhydrAMINE (BENADRYL) 25 MG tablet Take 25 mg by mouth daily as needed for allergies.     doxepin (SINEQUAN) 75 MG capsule Take 75 mg by mouth at bedtime.     esomeprazole (NEXIUM) 40 MG capsule Take 40 mg by mouth in the morning and at bedtime.     glipiZIDE (GLUCOTROL XL) 5 MG 24 hr tablet Take 5 mg by mouth daily.     hyoscyamine (LEVSIN SL) 0.125 MG SL tablet Place 1 tablet (0.125 mg total) under the tongue every 6 (six) hours as needed for cramping. 90 tablet 0   ibuprofen (ADVIL,MOTRIN) 200 MG tablet Take 200-800 mg by mouth every 8 (eight) hours as needed for moderate pain.     insulin detemir (LEVEMIR) 100 UNIT/ML injection Inject 8 Units into the skin daily.     lubiprostone (AMITIZA) 8 MCG capsule TAKE 1 CAPSULE BY MOUTH  ONCE DAILY WITH BREAKFAST (Patient taking differently: Take 8 mcg by mouth daily as needed for constipation.) 90 capsule 3   methocarbamol (ROBAXIN) 500 MG tablet Take 500 mg by mouth every 6 (six) hours as needed for muscle spasms.      metoprolol (TOPROL-XL) 100 MG 24 hr tablet Take 100 mg by mouth every morning.     Multiple Vitamins-Minerals (CENTRUM SILVER ULTRA WOMENS) TABS Take 1 tablet by mouth daily.     predniSONE (DELTASONE) 5 MG tablet Take 5 mg by mouth daily.     traMADol (ULTRAM) 50 MG tablet Take 50-100 mg by mouth every 6 (six) hours as needed for moderate pain.     nitroGLYCERIN (NITROGLYN) 2 % ointment Apply 1 inch topically every 8 (eight) hours as needed (hemorrhoids). (Patient not taking: Reported on 07/20/2021)     prochlorperazine (COMPAZINE) 10 MG tablet Take 1 tablet (10 mg total) by mouth every 6 (six) hours as needed for nausea or vomiting. (Patient not taking: Reported on 07/20/2021) 30 tablet 3   No current facility-administered medications for this visit.    ALLERGIES:  Allergies  Allergen Reactions   Lisinopril Anaphylaxis    Closing of throat   Aspirin Nausea Only   Propoxyphene N-Acetaminophen Nausea And Vomiting   Zithromax [Azithromycin] Nausea Only   Erythromycin Rash    PHYSICAL EXAM:  Performance status (ECOG): 1 - Symptomatic but completely ambulatory  Vitals:   07/20/21 0928  BP: 123/75  Pulse: (!) 103  Resp: 18  Temp: (!) 97.4 F (36.3 C)  SpO2: 96%   Wt Readings from Last 3 Encounters:  07/20/21 174 lb 4.8 oz (79.1 kg)  07/06/21 174 lb (78.9 kg)  06/29/21 174 lb 8 oz (79.2 kg)   Physical Exam Vitals reviewed.  Constitutional:      Appearance: Normal appearance. She is obese.  Cardiovascular:     Rate and Rhythm: Normal rate and regular rhythm.     Pulses: Normal pulses.     Heart sounds: Normal heart sounds.  Pulmonary:     Effort: Pulmonary effort is normal.     Breath sounds: Normal breath sounds.  Neurological:     General: No focal deficit present.     Mental Status: She is alert and oriented to person, place, and time.  Psychiatric:  Mood and Affect: Mood normal.        Behavior: Behavior normal.     LABORATORY DATA:  I have reviewed the  labs as listed.     Latest Ref Rng & Units 06/29/2021   10:49 AM 06/22/2021   10:56 AM 06/07/2021    2:19 PM  CBC  WBC 4.0 - 10.5 K/uL 7.7   5.6   5.2    Hemoglobin 12.0 - 15.0 g/dL 15.2   13.5   15.2    Hematocrit 36.0 - 46.0 % 46.6   41.9   47.4    Platelets 150 - 400 K/uL 250   184   199        Latest Ref Rng & Units 06/29/2021   10:49 AM 06/07/2021    2:19 PM 05/24/2021   12:37 PM  CMP  Glucose 70 - 99 mg/dL 84   169     BUN 8 - 23 mg/dL 13   10     Creatinine 0.44 - 1.00 mg/dL 0.69   0.89   0.90    Sodium 135 - 145 mmol/L 143   141     Potassium 3.5 - 5.1 mmol/L 3.6   4.6     Chloride 98 - 111 mmol/L 108   108     CO2 22 - 32 mmol/L 26   25     Calcium 8.9 - 10.3 mg/dL 9.5   9.9     Total Protein 6.5 - 8.1 g/dL 7.9   7.5     Total Bilirubin 0.3 - 1.2 mg/dL 0.6   0.7     Alkaline Phos 38 - 126 U/L 52   55     AST 15 - 41 U/L 30   46     ALT 0 - 44 U/L 37   54       DIAGNOSTIC IMAGING:  I have independently reviewed the scans and discussed with the patient. NM PET Image Initial (PI) Skull Base To Thigh (F-18 FDG)  Result Date: 06/23/2021 CLINICAL DATA:  Initial treatment strategy for metastatic disease of unknown primary. EXAM: NUCLEAR MEDICINE PET SKULL BASE TO THIGH TECHNIQUE: 9.6 mCi F-18 FDG was injected intravenously. Full-ring PET imaging was performed from the skull base to thigh after the radiotracer. CT data was obtained and used for attenuation correction and anatomic localization. Fasting blood glucose: 70 mg/dl COMPARISON:  CT on 05/24/2021 FINDINGS: Mediastinal blood-pool activity (background): SUV max = 2.8 Liver activity (reference): SUV max = N/A NECK: Hypermetabolic sub-centimeter left level 5A lymph nodes are seen which measure up to 7 in size, and has SUV max of 6.4. Mild bilateral supraclavicular lymphadenopathy is seen which is hypermetabolic, with index left supraclavicular lymph node measuring 14 mm on image 49/3, with SUV max of 13.1. Bilateral sub-cm level 4  lower jugular lymphadenopathy is also seen which is hypermetabolic, with SUV max on the left measuring 10.9. Incidental CT findings:  None. CHEST: Hypermetabolic lymphadenopathy is seen throughout the mediastinum, with largest lymph node in the right paratracheal region measuring 1.9 cm on image 78/3, with SUV max of 15.7. Hypermetabolic left hilar lymphadenopathy is seen with SUV max of 15.1. No suspicious pulmonary nodules or masses are seen on CT images. Ill-defined patchy ground-glass opacities are seen in the peripheral upper lung fields, which show FDG uptake and are consistent with infectious or inflammatory etiology. No evidence of pleural effusion. Incidental CT findings:  None. ABDOMEN/PELVIS: hypermetabolic masses are seen throughout the liver. Largest  in segment 3 of the left hepatic lobe measures approximately 4.4 cm in diameter and has SUV max of 13.2. A 9 mm subcapsular lesion in the anterior lower pole of the left kidney is hypermetabolic, with SUV max 4.9. No abnormal hypermetabolic activity within the pancreas, adrenal glands, or spleen. No hypermetabolic lymph nodes in the abdomen or pelvis. Incidental CT findings: Aortic atherosclerotic calcification noted. Prior hysterectomy noted. Adnexal regions are unremarkable in appearance. SKELETON: No focal hypermetabolic bone lesions to suggest skeletal metastasis. Incidental CT findings:  None. IMPRESSION: Hypermetabolic lymphadenopathy throughout the neck and chest. Diffuse hypermetabolic liver metastases. 9 mm hypermetabolic lesion in lower pole of left kidney, suspicious for small renal cell carcinoma. Electronically Signed   By: Marlaine Hind M.D.   On: 06/23/2021 14:05   DG Chest Port 1 View  Result Date: 07/12/2021 CLINICAL DATA:  Left port placement.  History of COPD. EXAM: PORTABLE CHEST 1 VIEW COMPARISON:  [facility] chest 09/02/2019 FINDINGS: Left chest wall subclavian approach porta catheter tip overlies the central superior vena cava. Cardiac  silhouette and mediastinal contours are unchanged and within normal limits with moderate calcification again seen within the aortic arch. Mildly decreased lung volumes. Mild central bronchial wall thickening is similar to prior and chronic. No focal airspace opacity to indicate pneumonia. No pleural effusion or pneumothorax. No acute skeletal abnormality. IMPRESSION: New left chest wall port a catheter with tip in appropriate position. No pneumothorax. Electronically Signed   By: Yvonne Kendall M.D.   On: 07/12/2021 11:49   DG C-Arm 1-60 Min-No Report  Result Date: 07/12/2021 Fluoroscopy was utilized by the requesting physician.  No radiographic interpretation.   [place] CORE BIOPSY (LIVER)  Result Date: 06/22/2021 INDICATION: 66 year old female with metastatic disease of the liver referred for biopsy EXAM: IMAGE GUIDED LIVER MASS BIOPSY MEDICATIONS: None. ANESTHESIA/SEDATION: Moderate (conscious) sedation was employed during this procedure. A total of Versed 1.0 mg and Fentanyl 25 mcg was administered intravenously by the radiology nurse. Total intra-service moderate Sedation Time: 10 minutes. The patient's level of consciousness and vital signs were monitored continuously by radiology nursing throughout the procedure under my direct supervision. COMPLICATIONS: None PROCEDURE: Informed written consent was obtained from the patient after a thorough discussion of the procedural risks, benefits and alternatives. All questions were addressed. Maximal Sterile Barrier Technique was utilized including caps, mask, sterile gowns, sterile gloves, sterile drape, hand hygiene and skin antiseptic. A timeout was performed prior to the initiation of the procedure. Ultrasound survey of the left liver lobe performed with images stored and sent to PACs. The subxiphoid region was prepped with chlorhexidine in a sterile fashion, and a sterile drape was applied covering the operative field. A sterile gown and sterile gloves were used  for the procedure. Local anesthesia was provided with 1% Lidocaine. The patient was prepped and draped sterilely and the skin and subcutaneous tissues were generously infiltrated with 1% lidocaine. A 17 gauge introducer needle was then advanced under ultrasound guidance in subxiphoid location into the left liver lobe, targeting the inferior hypoechoic mass. The stylet was removed, and multiple separate 18 gauge core biopsy were retrieved. Samples were placed into formalin for transportation to the lab. Gel-Foam slurry was then infused with a small amount of saline for assistance with hemostasis. The needle was removed, and a final ultrasound image was performed. The patient tolerated the procedure well and remained hemodynamically stable throughout. No complications were encountered and no significant blood loss was encounter. IMPRESSION: Status post ultrasound-guided biopsy of left-sided liver  mass. Signed, Dulcy Fanny. Dellia Nims, RPVI [facility] Electronically Signed   By: Corrie Mckusick D.O.   On: 06/22/2021 13:54     ASSESSMENT:  Liver masses/mediastinal and left supraclavicular adenopathy: - She has felt left supraclavicular lymph node since November 2022. - She reported 26 pound weight loss in the last 6 to 8 months.  She reports she has been on Ozempic for the last 8 months.  Denies any fevers or night sweats although reports severe tiredness which is worsening.  No new pains. - She had a hysterectomy at age 31 due to cancer.  Unclear uterine or cervical.  Did not require any further therapies. - EGD/colonoscopy on 11/17/2018: Nonbleeding internal hemorrhoids, normal colon, normal esophagus, nonbleeding erosive gastropathy, normal duodenum. - CT soft tissue neck (05/24/2021): Cluster of mildly prominent lymph nodes in the lower neck bilaterally left greater than right. - CT CAP (05/24/2021): Mediastinal and left hilar adenopathy.  Numerous  enhancing low-density lesions throughout the liver measuring up to 4.1 cm. - Liver biopsy (06/22/2021): Poorly differentiated carcinoma with necrosis.  IHC strong positivity for CK5/6, p63, PAX8.  Ki-67 with high proliferative rate.  Negative for CK7, CD56, CK20, chromogranin, synaptophysin, TTF-1, Napsin A, marked 31, ER, GATA3 and CDX2.  Differential includes renal or gynecological primary. - PET scan (06/22/2021): Hypermetabolic lymphadenopathy throughout the neck, chest, liver metastasis, 9 mm hypermetabolic lesion in the lower pole of the left kidney suspicious for small RCC.    Social/family history: - She lives at home with her son and daughter-in-law.  She has been on disability secondary to IBS since 2001.  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  She is a current active smoker, 1/3 pack/day for the last 30 years.   PLAN:  Metastatic poorly differentiated carcinoma in the liver, chest adenopathy: - I have reviewed NGS testing which did not have enough tumor cells to run the test. - I have recommended left supraclavicular lymph node biopsy with tumor tissue sent for NGS testing.  NGS test will also give [place] tumor origin results. - I have recommended holding Plavix 5 days prior to biopsy. - She has left chest wall port placed which looks well-healed. - Labs on 06/07/2021 shows elevated CEA of 24.  CA125 was normal.  CA 19-9 was slightly elevated at 73.  aFP was normal.  LDH was 324.  2.  Intermittent nausea: - We will start her on Compazine every 6 hours as needed.   Orders placed this encounter:  No orders of the defined types were placed in this encounter.    Derek Jack, MD [place] [phone]   I, Thana Ates, am acting as a scribe for Dr. Derek Jack.  I, Derek Jack MD, have reviewed the above documentation for accuracy and completeness, and I agree with the above.

## 2021-07-28 ENCOUNTER — Other Ambulatory Visit (HOSPITAL_COMMUNITY): Payer: Self-pay | Admitting: Hematology

## 2021-07-28 DIAGNOSIS — R160 Hepatomegaly, not elsewhere classified: Secondary | ICD-10-CM

## 2021-07-28 DIAGNOSIS — R590 Localized enlarged lymph nodes: Secondary | ICD-10-CM

## 2021-07-28 NOTE — Progress Notes (Unsigned)
Brittney Daft, MD  [name] D US guided left cervical lymph node biopsy.  See note from ordering physician:  "Bx left supraclavicular LN. Please obtain at least 6 cores if possible. Please send 4 directly to [facility] for NGS testing and 2 for path"   [facility]

## 2021-08-01 DIAGNOSIS — E119 Type 2 diabetes mellitus without complications: Secondary | ICD-10-CM | POA: Diagnosis not present

## 2021-08-11 ENCOUNTER — Other Ambulatory Visit: Payer: Self-pay | Admitting: Radiology

## 2021-08-11 ENCOUNTER — Other Ambulatory Visit: Payer: Self-pay | Admitting: Student

## 2021-08-11 DIAGNOSIS — R591 Generalized enlarged lymph nodes: Secondary | ICD-10-CM

## 2021-08-14 ENCOUNTER — Encounter (HOSPITAL_COMMUNITY): Payer: Self-pay

## 2021-08-14 ENCOUNTER — Ambulatory Visit (HOSPITAL_COMMUNITY)
Admission: RE | Admit: 2021-08-14 | Discharge: 2021-08-14 | Disposition: A | Discharge status: Home / Self Care | Payer: Medicare Other | Source: Ambulatory Visit | Attending: Hematology | Admitting: Hematology

## 2021-08-14 ENCOUNTER — Other Ambulatory Visit: Payer: Self-pay

## 2021-08-14 DIAGNOSIS — R160 Hepatomegaly, not elsewhere classified: Secondary | ICD-10-CM | POA: Diagnosis not present

## 2021-08-14 DIAGNOSIS — G4733 Obstructive sleep apnea (adult) (pediatric): Secondary | ICD-10-CM | POA: Diagnosis not present

## 2021-08-14 DIAGNOSIS — C770 Secondary and unspecified malignant neoplasm of lymph nodes of head, face and neck: Secondary | ICD-10-CM | POA: Diagnosis not present

## 2021-08-14 DIAGNOSIS — M069 Rheumatoid arthritis, unspecified: Secondary | ICD-10-CM | POA: Diagnosis not present

## 2021-08-14 DIAGNOSIS — Z8673 Personal history of transient ischemic attack (TIA), and cerebral infarction without residual deficits: Secondary | ICD-10-CM | POA: Diagnosis not present

## 2021-08-14 DIAGNOSIS — M109 Gout, unspecified: Secondary | ICD-10-CM | POA: Insufficient documentation

## 2021-08-14 DIAGNOSIS — C801 Malignant (primary) neoplasm, unspecified: Secondary | ICD-10-CM | POA: Diagnosis not present

## 2021-08-14 DIAGNOSIS — R590 Localized enlarged lymph nodes: Secondary | ICD-10-CM | POA: Diagnosis not present

## 2021-08-14 DIAGNOSIS — F419 Anxiety disorder, unspecified: Secondary | ICD-10-CM | POA: Diagnosis not present

## 2021-08-14 DIAGNOSIS — K589 Irritable bowel syndrome without diarrhea: Secondary | ICD-10-CM | POA: Diagnosis not present

## 2021-08-14 DIAGNOSIS — R591 Generalized enlarged lymph nodes: Secondary | ICD-10-CM

## 2021-08-14 DIAGNOSIS — J449 Chronic obstructive pulmonary disease, unspecified: Secondary | ICD-10-CM | POA: Insufficient documentation

## 2021-08-14 DIAGNOSIS — K219 Gastro-esophageal reflux disease without esophagitis: Secondary | ICD-10-CM | POA: Insufficient documentation

## 2021-08-14 DIAGNOSIS — I350 Nonrheumatic aortic (valve) stenosis: Secondary | ICD-10-CM | POA: Diagnosis not present

## 2021-08-14 DIAGNOSIS — I10 Essential (primary) hypertension: Secondary | ICD-10-CM | POA: Diagnosis not present

## 2021-08-14 DIAGNOSIS — E119 Type 2 diabetes mellitus without complications: Secondary | ICD-10-CM | POA: Insufficient documentation

## 2021-08-14 DIAGNOSIS — J45909 Unspecified asthma, uncomplicated: Secondary | ICD-10-CM | POA: Diagnosis not present

## 2021-08-14 LAB — GLUCOSE, CAPILLARY: Glucose-Capillary: 91 mg/dL (ref 70–99)

## 2021-08-14 MED ORDER — LIDOCAINE HCL (PF) 1 % IJ SOLN
INTRAMUSCULAR | Status: AC
  Filled 2021-08-14: qty 30

## 2021-08-14 MED ORDER — FENTANYL CITRATE (PF) 100 MCG/2ML IJ SOLN
INTRAMUSCULAR | Status: AC | PRN
  Administered 2021-08-14 (×2): 25 ug via INTRAVENOUS

## 2021-08-14 MED ORDER — MIDAZOLAM HCL 2 MG/2ML IJ SOLN
INTRAMUSCULAR | Status: AC | PRN
  Administered 2021-08-14: .5 mg via INTRAVENOUS
  Administered 2021-08-14: 1 mg via INTRAVENOUS

## 2021-08-14 MED ORDER — FENTANYL CITRATE (PF) 100 MCG/2ML IJ SOLN
INTRAMUSCULAR | Status: AC
  Filled 2021-08-14: qty 2

## 2021-08-14 MED ORDER — SODIUM CHLORIDE 0.9 % IV SOLN: INTRAVENOUS | Status: DC

## 2021-08-14 MED ORDER — DEXTROSE 50 % IV SOLN
INTRAVENOUS | Status: AC
  Filled 2021-08-14: qty 50

## 2021-08-14 MED ORDER — IPRATROPIUM-ALBUTEROL 0.5-2.5 (3) MG/3ML IN SOLN
3.0000 mL | Freq: Once | RESPIRATORY_TRACT | Status: AC
Start: 2021-08-14 — End: 2021-08-14
  Administered 2021-08-14: 3 mL via RESPIRATORY_TRACT
  Filled 2021-08-14: qty 3

## 2021-08-14 MED ORDER — MIDAZOLAM HCL 2 MG/2ML IJ SOLN
INTRAMUSCULAR | Status: AC
  Filled 2021-08-14: qty 2

## 2021-08-14 MED ORDER — DEXTROSE 50 % IV SOLN
25.0000 mL | Freq: Once | INTRAVENOUS | Status: AC
  Administered 2021-08-14: 25 mL via INTRAVENOUS

## 2021-08-14 NOTE — Procedures (Signed)
Interventional Radiology Procedure Note  Date of Procedure: 08/14/2021  Procedure: Left supraclavicular LN biopsy    Findings:  1. Left supraclavicular LN biopsy 18ga x8 passes    Complications: No immediate complications noted.   Estimated Blood Loss: minimal  Follow-up and Recommendations: 1. Bedrest 1 hour    Albin Felling, MD  [facility]iology  08/14/2021 1:55 PM

## 2021-08-14 NOTE — Progress Notes (Signed)
Client states feels better after nebulizer tx

## 2021-08-14 NOTE — Progress Notes (Signed)
CBG was 66. 32ml of D50 pushed at 1209.

## 2021-08-14 NOTE — H&P (Addendum)
Chief Complaint: Patient was seen in consultation today for left supraclavicular adenopathy at the request of Katragadda,Sreedhar  Referring Physician(s): Katragadda,Sreedhar  Supervising Physician: Albin Felling  Patient Status: [facility] - Out-pt  History of Present Illness: Brittney Martinez is a 66 y.o. female with PMH of abdominal aortic stenosis, anxiety, asthma, complication of anesthesia, COPD, DM II, GERD, glaucoma, gout, HTN, IBS, PONV, RA, OSA and TIA. Pt is well known to IR service having liver mass biopsy 06/22/21 that resulted as poorly differentiated carcinoma with necrosis. Pt was referred by Dr. Delton Coombes for left supraclavicular lymph node biopsy. Dr. Anselm Pancoast approved left cervical lymph node biopsy.  Past Medical History:  Diagnosis Date   Abdominal aortic stenosis    Anal fissure    Anxiety    Asthma    Complication of anesthesia    COPD (chronic obstructive pulmonary disease) (HCC)    Depression    grief   Diabetes mellitus    Diverticulitis    GERD (gastroesophageal reflux disease)    Glaucoma    Gout    Hypertension    IBS (irritable bowel syndrome)    PONV (postoperative nausea and vomiting)    RA (rheumatoid arthritis) ([place])    Sleep apnea    had one at one time but not anymore   Stroke ([place])    "mini-stroke"- no deficits   TIA (transient ischemic attack)    2010. No deficits    Past Surgical History:  Procedure Laterality Date   ABDOMINAL HYSTERECTOMY  1982   complete   BIOPSY  11/17/2018   Procedure: BIOPSY;  Surgeon: Daneil Dolin, MD;  Location: [facility] END[facility] SU[facility];  Service: Endoscopy;;   BREAST LUMPECTOMY     right-benign   CATARACT EXTRACTION W/PHACO Right 05/13/2017   Procedure: CATARACT EXTRACTION PHACO AND INTRAOCULAR LENS PLACEMENT ([place]);  Surgeon: Tonny Branch, MD;  Location: [facility] ORS;  Service: Ophthalmology;  Laterality: Right;  CDE: 8.59   CATARACT EXTRACTION W/PHACO Left 05/27/2017   Procedure: CATARACT EXTRACTION WITH  PHACOEMULSIFICATION  AND INTRAOCULAR LENS PLACEMENT LEFT EYE;  Surgeon: Tonny Branch, MD;  Location: [facility] ORS;  Service: Ophthalmology;  Laterality: Left;  CDE: 5.66   CHOLECYSTECTOMY  2003   COLONOSCOPY  12/14/09   Dr. Gala Romney :anal papilla and hemorrhoids,diminutive hperplastic rectal polyps/normal colon   COLONOSCOPY N/A 10/29/2013   FTD:DUKG papilla and internal hemorrhoids; colonic polyps-removed as described above. I suspect benign anorectal bleeding in the setting of hemorrhoids and  possibly fissure. tubular adenoma. next TCS 10/2020.   COLONOSCOPY WITH PROPOFOL N/A 11/17/2018   Dr. Gala Romney: Nonbleeding internal hemorrhoids, hemorrhoids were moderate and grade.  Exam otherwise normal.  Colonoscopy in 5 years.   ELBOW SURGERY     right   ESOPHAGOGASTRODUODENOSCOPY  12/14/09   Dr. Gala Romney :schatzkis ring 41F, otherwise normal   ESOPHAGOGASTRODUODENOSCOPY N/A 10/29/2013   RMR: Distal esophageal pseudodiverticulum/Nissen fundoplication   ESOPHAGOGASTRODUODENOSCOPY (EGD) WITH PROPOFOL N/A 11/17/2018   Dr. Gala Romney: Normal esophagus status post dilation.  Nonbleeding erosive gastropathy with benign biopsy.   FINGER SURGERY     on right middle, pointer, and index fingers   FISSURECTOMY     several   INCISIONAL HERNIA REPAIR N/A 03/22/2014   Procedure: [name] HERNIORRHAPHY;  Surgeon: Jamesetta So, MD;  Location: [facility] ORS;  Service: General;  Laterality: N/A;   INSERTION OF MESH N/A 03/22/2014   Procedure: INSERTION OF MESH;  Surgeon: Jamesetta So, MD;  Location: [facility] ORS;  Service: General;  Laterality: N/A;   MALONEY  DILATION N/A 11/17/2018   Procedure: [name] DILATION;  Surgeon: Daneil Dolin, MD;  Location: [facility] END[facility] SUITE;  Service: Endoscopy;  Laterality: N/A;   NISSEN FUNDOPLICATION  1448   [facility] [place]   PORTACATH PLACEMENT Left 07/12/2021   Procedure: INSERTION PORT-A-CATH;  Surgeon: Virl Cagey, MD;  Location: [facility] ORS;  Service: General;  Laterality: Left;   WRIST SURGERY     left     Allergies: Lisinopril, Aspirin, Propoxyphene n-acetaminophen, Zithromax [azithromycin], and Erythromycin  Medications: Prior to Admission medications   Medication Sig Start Date End Date Taking? Authorizing Provider  albuterol (PROVENTIL HFA;VENTOLIN HFA) 108 (90 BASE) MCG/ACT inhaler Inhale 2 puffs into the lungs every 6 (six) hours as needed for wheezing or shortness of breath.   Yes [provider]  albuterol (PROVENTIL) (2.5 MG/3ML) 0.083% nebulizer solution Take 2.5 mg by nebulization every 6 (six) hours as needed for wheezing or shortness of breath.   Yes [provider]  allopurinol (ZYLOPRIM) 300 MG tablet Take 300 mg by mouth daily.   Yes [provider]  amLODipine (NORVASC) 5 MG tablet Take 5 mg by mouth every morning.  03/08/18  Yes [provider]  aspirin 81 MG tablet Take 81 mg by mouth daily.   Yes [provider]  atorvastatin (LIPITOR) 20 MG tablet Take 20 mg by mouth at bedtime.   Yes [provider]  Biotin w/ Vitamins C & E (HAIR/SKIN/NAILS PO) Take 1 tablet by mouth daily.   Yes [provider]  budesonide-formoterol (SYMBICORT) 160-4.5 MCG/ACT inhaler Inhale 2 puffs into the lungs in the morning and at bedtime.   Yes [provider]  cholecalciferol (VITAMIN D3) 25 MCG (1000 UNIT) tablet Take 1,000 Units by mouth daily.   Yes [provider]  cycloSPORINE (RESTASIS) 0.05 % ophthalmic emulsion Place 1 drop into both eyes 2 (two) times daily.   Yes [provider]  diphenhydrAMINE (BENADRYL) 25 MG tablet Take 25 mg by mouth daily as needed for allergies.   Yes [provider]  doxepin (SINEQUAN) 75 MG capsule Take 75 mg by mouth at bedtime.   Yes [provider]  esomeprazole (NEXIUM) 40 MG capsule Take 40 mg by mouth in the morning and at bedtime.   Yes [provider]  glipiZIDE (GLUCOTROL XL) 2.5 MG 24 hr tablet Take 2.5 mg by mouth daily with breakfast.    Yes [provider]  hyoscyamine (LEVSIN SL) 0.125 MG SL tablet Place 1 tablet (0.125 mg total) under the tongue every 6 (six) hours as needed for cramping. 06/26/21  Yes Aliene Altes S, PA-C  ibuprofen (ADVIL) 200 MG tablet Take 400 mg by mouth every 6 (six) hours as needed for moderate pain.   Yes [provider]  insulin detemir (LEVEMIR) 100 UNIT/ML injection Inject 10 Units into the skin daily.   Yes [provider]  methocarbamol (ROBAXIN) 500 MG tablet Take 500 mg by mouth every 6 (six) hours as needed for muscle spasms. 06/14/21  Yes [provider]  metoprolol (TOPROL-XL) 100 MG 24 hr tablet Take 100 mg by mouth every morning.   Yes [provider]  predniSONE (DELTASONE) 10 MG tablet Take 10 mg by mouth daily.   Yes [provider]  prochlorperazine (COMPAZINE) 10 MG tablet Take 1 tablet (10 mg total) by mouth every 6 (six) hours as needed for nausea or vomiting. 06/29/21  Yes Derek Jack, MD  traMADol (ULTRAM) 50 MG tablet Take 50-100 mg by  mouth every 6 (six) hours as needed for moderate pain.   Yes [provider]  clopidogrel (PLAVIX) 75 MG tablet Take 75 mg by mouth daily.    [provider]  lubiprostone (AMITIZA) 8 MCG capsule TAKE 1 CAPSULE BY MOUTH  ONCE DAILY WITH BREAKFAST Patient taking differently: Take 8 mcg by mouth daily as needed for constipation. 08/27/19   Erenest Rasher, PA-C  MELATONIN PO Take 1 capsule by mouth at bedtime as needed (sleep).    [provider]     Family History  Problem Relation Age of Onset   Colon cancer Father 71   Hypertension Sister    Hypertension Brother    Kidney disease Brother     Social History   Socioeconomic History   Marital status: Widowed    Spouse name: Not on file   Number of children: Not on file   Years of education: Not on file   Highest education level: Not on file  Occupational History   Occupation: disabled  Tobacco Use    Smoking status: Former    Packs/day: 0.25    Years: 19.00    Total pack years: 4.75    Types: Cigarettes    Quit date: 01/09/2007    Years since quitting: 14.6   Smokeless tobacco: Never  Vaping Use   Vaping Use: Never used  Substance and Sexual Activity   Alcohol use: No   Drug use: No   Sexual activity: Never  Other Topics Concern   Not on file  Social History Narrative   1 son-healthy   1 daughter-MVA (drunk-driver)   Social Determinants of Health   Financial Resource Strain: Not on file  Food Insecurity: Not on file  Transportation Needs: Not on file  Physical Activity: Not on file  Stress: Not on file  Social Connections: Not on file     Review of Systems: A 12 point ROS discussed and pertinent positives are indicated in the HPI above.  All other systems are negative.  Review of Systems  All other systems reviewed and are negative.   Vital Signs: BP 115/80   Pulse 75   Temp 98.2 F (36.8 C) (Oral)   Ht 5' 1.5" (1.562 m)   Wt 182 lb (82.6 kg)   SpO2 91%   BMI 33.83 kg/m   Physical Exam Vitals reviewed.  Constitutional:      General: She is not in acute distress.    Appearance: Normal appearance. She is not ill-appearing.  HENT:     Head: Normocephalic and atraumatic.     Mouth/Throat:     Mouth: Mucous membranes are moist.     Pharynx: Oropharynx is clear.  Eyes:     Extraocular Movements: Extraocular movements intact.     Pupils: Pupils are equal, round, and reactive to light.  Neck:     Comments: Palpable L supraclavicular node Cardiovascular:     Rate and Rhythm: Normal rate and regular rhythm.     Pulses: Normal pulses.     Heart sounds: Normal heart sounds.  Pulmonary:     Effort: Pulmonary effort is normal. No respiratory distress.     Breath sounds: Wheezing present.     Comments: Wheezing bilat Abdominal:     General: Bowel sounds are normal. There is no distension.     Palpations: Abdomen is soft.     Tenderness: There is no  abdominal tenderness. There is no guarding.  Musculoskeletal:     Right lower leg:  No edema.     Left lower leg: No edema.  Skin:    General: Skin is warm and dry.  Neurological:     Mental Status: She is alert and oriented to person, place, and time.  Psychiatric:        Mood and Affect: Mood normal.        Behavior: Behavior normal.        Thought Content: Thought content normal.        Judgment: Judgment normal.     Imaging: No results found.  Labs:  CBC: Recent Labs    06/07/21 1419 06/22/21 1056 06/29/21 1049  WBC 5.2 5.6 7.7  HGB 15.2* 13.5 15.2*  HCT 47.4* 41.9 46.6*  PLT 199 184 250    COAGS: Recent Labs    06/22/21 1056  INR 1.0    BMP: Recent Labs    05/24/21 1237 06/07/21 1419 06/29/21 1049  NA  --  141 143  K  --  4.6 3.6  CL  --  108 108  CO2  --  25 26  GLUCOSE  --  169* 84  BUN  --  10 13  CALCIUM  --  9.9 9.5  CREATININE 0.90 0.89 0.69  GFRNONAA  --  >60 >60    LIVER FUNCTION TESTS: Recent Labs    06/07/21 1419 06/29/21 1049  BILITOT 0.7 0.6  AST 46* 30  ALT 54* 37  ALKPHOS 55 52  PROT 7.5 7.9  ALBUMIN 3.9 4.2    TUMOR MARKERS: No results for input(s): "AFPTM", "CEA", "CA199", "CHROMGRNA" in the last 8760 hours.  Assessment and Plan: History of abdominal aortic stenosis, anxiety, asthma, complication of anesthesia, COPD, DM II, GERD, glaucoma, gout, HTN, IBS, PONV, RA, OSA and TIA. Pt is well known to IR service having liver mass biopsy 06/22/21 that resulted as poorly differentiated carcinoma with necrosis. Pt was referred by Dr. Delton Coombes for left supraclavicular lymph node biopsy. Dr. Anselm Pancoast approved left cervical lymph node biopsy.  Pt resting on stretcher. She is A&O, calm and pleasant.  She is in no distress.  Pt is NPO per order.  She states last Plavix and ASA was 6 days ago.   Risks and benefits of left cervical lymph node biopsy with moderate sedation was discussed with the patient and/or patient's family  including, but not limited to bleeding, infection, damage to adjacent structures or low yield requiring additional tests.  All of the questions were answered and there is agreement to proceed.  Consent signed and in chart.   Thank you for this interesting consult.  I greatly enjoyed meeting MADORA BARLETTA and look forward to participating in their care.  A copy of this report was sent to the requesting provider on this date.  Electronically Signed: Tyson Alias, NP 08/14/2021, 12:22 PM   I spent a total of 20 minutes in face to face in clinical consultation, greater than 50% of which was counseling/coordinating care for  left supraclavicular adenopathy.

## 2021-08-14 NOTE — Progress Notes (Signed)
Client states takes nebulizer treatments at home; given tx now as ordered due to wheezing

## 2021-08-15 LAB — GLUCOSE, CAPILLARY: Glucose-Capillary: 66 mg/dL — ABNORMAL LOW (ref 70–99)

## 2021-08-16 ENCOUNTER — Ambulatory Visit (HOSPITAL_COMMUNITY): Payer: Medicare Other | Admitting: Hematology

## 2021-08-16 LAB — SURGICAL PATHOLOGY

## 2021-08-22 DIAGNOSIS — Z794 Long term (current) use of insulin: Secondary | ICD-10-CM | POA: Diagnosis not present

## 2021-08-22 DIAGNOSIS — I10 Essential (primary) hypertension: Secondary | ICD-10-CM | POA: Diagnosis not present

## 2021-08-22 DIAGNOSIS — Z789 Other specified health status: Secondary | ICD-10-CM | POA: Diagnosis not present

## 2021-08-22 DIAGNOSIS — E1165 Type 2 diabetes mellitus with hyperglycemia: Secondary | ICD-10-CM | POA: Diagnosis not present

## 2021-08-22 DIAGNOSIS — Z299 Encounter for prophylactic measures, unspecified: Secondary | ICD-10-CM | POA: Diagnosis not present

## 2021-08-22 DIAGNOSIS — J45909 Unspecified asthma, uncomplicated: Secondary | ICD-10-CM | POA: Diagnosis not present

## 2021-08-29 DIAGNOSIS — I69398 Other sequelae of cerebral infarction: Secondary | ICD-10-CM | POA: Diagnosis not present

## 2021-08-29 DIAGNOSIS — J449 Chronic obstructive pulmonary disease, unspecified: Secondary | ICD-10-CM | POA: Diagnosis not present

## 2021-08-29 DIAGNOSIS — M05659 Rheumatoid arthritis of unspecified hip with involvement of other organs and systems: Secondary | ICD-10-CM | POA: Diagnosis not present

## 2021-08-29 DIAGNOSIS — G894 Chronic pain syndrome: Secondary | ICD-10-CM | POA: Diagnosis not present

## 2021-08-31 DIAGNOSIS — E119 Type 2 diabetes mellitus without complications: Secondary | ICD-10-CM | POA: Diagnosis not present

## 2021-09-04 ENCOUNTER — Ambulatory Visit (HOSPITAL_COMMUNITY): Payer: Medicare Other | Admitting: Hematology

## 2021-09-06 ENCOUNTER — Ambulatory Visit (HOSPITAL_COMMUNITY): Payer: Medicare Other | Admitting: Hematology

## 2021-09-07 DIAGNOSIS — C770 Secondary and unspecified malignant neoplasm of lymph nodes of head, face and neck: Secondary | ICD-10-CM | POA: Diagnosis not present

## 2021-09-08 ENCOUNTER — Encounter (HOSPITAL_COMMUNITY): Payer: Self-pay

## 2021-09-13 ENCOUNTER — Inpatient Hospital Stay (HOSPITAL_COMMUNITY): Payer: Medicare Other | Attending: Hematology | Admitting: Hematology

## 2021-09-13 ENCOUNTER — Encounter (HOSPITAL_COMMUNITY): Payer: Self-pay | Admitting: Hematology

## 2021-09-13 ENCOUNTER — Other Ambulatory Visit: Payer: Self-pay

## 2021-09-13 VITALS — BP 154/72 | HR 101 | Temp 99.2°F | Resp 22 | Ht 61.81 in | Wt 176.4 lb

## 2021-09-13 DIAGNOSIS — R590 Localized enlarged lymph nodes: Secondary | ICD-10-CM | POA: Diagnosis not present

## 2021-09-13 DIAGNOSIS — Z5111 Encounter for antineoplastic chemotherapy: Secondary | ICD-10-CM | POA: Insufficient documentation

## 2021-09-13 DIAGNOSIS — Z800 Family history of malignant neoplasm of digestive organs: Secondary | ICD-10-CM | POA: Diagnosis not present

## 2021-09-13 DIAGNOSIS — C772 Secondary and unspecified malignant neoplasm of intra-abdominal lymph nodes: Secondary | ICD-10-CM | POA: Diagnosis present

## 2021-09-13 DIAGNOSIS — E860 Dehydration: Secondary | ICD-10-CM | POA: Insufficient documentation

## 2021-09-13 DIAGNOSIS — M069 Rheumatoid arthritis, unspecified: Secondary | ICD-10-CM | POA: Diagnosis not present

## 2021-09-13 DIAGNOSIS — B370 Candidal stomatitis: Secondary | ICD-10-CM | POA: Diagnosis not present

## 2021-09-13 DIAGNOSIS — F17210 Nicotine dependence, cigarettes, uncomplicated: Secondary | ICD-10-CM | POA: Insufficient documentation

## 2021-09-13 DIAGNOSIS — R197 Diarrhea, unspecified: Secondary | ICD-10-CM | POA: Insufficient documentation

## 2021-09-13 DIAGNOSIS — R160 Hepatomegaly, not elsewhere classified: Secondary | ICD-10-CM

## 2021-09-13 DIAGNOSIS — T451X5A Adverse effect of antineoplastic and immunosuppressive drugs, initial encounter: Secondary | ICD-10-CM | POA: Diagnosis not present

## 2021-09-13 DIAGNOSIS — C770 Secondary and unspecified malignant neoplasm of lymph nodes of head, face and neck: Secondary | ICD-10-CM | POA: Diagnosis not present

## 2021-09-13 DIAGNOSIS — K589 Irritable bowel syndrome without diarrhea: Secondary | ICD-10-CM | POA: Diagnosis not present

## 2021-09-13 DIAGNOSIS — I959 Hypotension, unspecified: Secondary | ICD-10-CM | POA: Insufficient documentation

## 2021-09-13 DIAGNOSIS — G620 Drug-induced polyneuropathy: Secondary | ICD-10-CM | POA: Diagnosis not present

## 2021-09-13 DIAGNOSIS — R110 Nausea: Secondary | ICD-10-CM | POA: Diagnosis not present

## 2021-09-13 DIAGNOSIS — C3490 Malignant neoplasm of unspecified part of unspecified bronchus or lung: Secondary | ICD-10-CM | POA: Insufficient documentation

## 2021-09-13 DIAGNOSIS — Z5189 Encounter for other specified aftercare: Secondary | ICD-10-CM | POA: Insufficient documentation

## 2021-09-13 DIAGNOSIS — Z79899 Other long term (current) drug therapy: Secondary | ICD-10-CM | POA: Diagnosis not present

## 2021-09-13 DIAGNOSIS — C787 Secondary malignant neoplasm of liver and intrahepatic bile duct: Secondary | ICD-10-CM | POA: Diagnosis not present

## 2021-09-13 NOTE — Patient Instructions (Addendum)
[place] at [facility] Discharge Instructions   You were seen and examined today by Dr. Delton Coombes.  He reviewed the results of the special testing we sent on your biopsy. It did not show any targetable mutations. But it did reveal that the cancer is likely coming from your lung. We will treat will you with a combination of chemotherapy drugs and an immunotherapy drug. The chemotherapy drugs are Taxol and carboplatin. The immunotherapy drug is Keytruda. We will hold off on the immunotherapy for now due to your history of rheumatoid arthritis.   We will bring you in for a chemotherapy education class to discuss the drugs and side effects in detail.   Return as scheduled.    Thank you for choosing [place] at [facility] to provide your oncology and hematology care.  To afford each patient quality time with our provider, please arrive at least 15 minutes before your scheduled appointment time.   If you have a lab appointment with the [facility] please come in thru the Main Entrance and check in at the main information desk.  You need to re-schedule your appointment should you arrive 10 or more minutes late.  We strive to give you quality time with our providers, and arriving late affects you and other patients whose appointments are after yours.  Also, if you no show three or more times for appointments you may be dismissed from the clinic at the providers discretion.     Again, thank you for choosing [facility].  Our hope is that these requests will decrease the amount of time that you wait before being seen by our physicians.       _____________________________________________________________  Should you have questions after your visit to [facility], please contact our office at [phone] and follow the prompts.  Our office hours are 8:00 a.m. and 4:30 p.m. Monday - Friday.  Please note that voicemails left  after 4:00 p.m. may not be returned until the following business day.  We are closed weekends and major holidays.  You do have access to a nurse 24-7, just call the main number to the clinic [phone] and do not press any options, hold on the line and a nurse will answer the phone.    For prescription refill requests, have your pharmacy contact our office and allow 72 hours.    Due to Covid, you will need to wear a mask upon entering the hospital. If you do not have a mask, a mask will be given to you at the Main Entrance upon arrival. For doctor visits, patients may have 1 support person age 27 or older with them. For treatment visits, patients can not have anyone with them due to social distancing guidelines and our immunocompromised population.

## 2021-09-13 NOTE — Progress Notes (Signed)
START ON PATHWAY REGIMEN - Non-Small Cell Lung     A cycle is every 21 days:     Paclitaxel      Carboplatin   **Always confirm dose/schedule in your pharmacy ordering system**  Patient Characteristics: Stage IV Metastatic, Squamous, Molecular Analysis Completed, Alteration Present and Targeted Therapy Exhausted or EGFR Exon 20 Insertion or KRAS G12C or HER2 Present, and No Prior Chemo/Immunotherapy or No Alteration Present, PS = 0, 1, Initial  Chemotherapy/Immunotherapy, No Alteration Present, Not a Candidate for Immunotherapy Therapeutic Status: Stage IV Metastatic Histology: Squamous Cell Molecular Analysis Results: No Alteration Present ECOG Performance Status: 1 Chemotherapy/Immunotherapy Line of Therapy: Initial Chemotherapy/Immunotherapy Immunotherapy Candidate Status: Not a Candidate for Immunotherapy Intent of Therapy: Non-Curative / Palliative Intent, Discussed with Patient

## 2021-09-13 NOTE — Progress Notes (Signed)
[address] [place], [place] [address]   CLINIC:  Medical Oncology/Hematology  PCP:  Glenda Chroman, MD [address] / [place] [address] [phone]   REASON FOR VISIT:  Follow-up for liver masses/mediastinal and left supraclavicular adenopathy  PRIOR THERAPY: none  NGS Results: pending  CURRENT THERAPY: Carboplatin and paclitaxel  BRIEF ONCOLOGIC HISTORY:  Oncology History   No history exists.    CANCER STAGING: Cancer Staging  No matching staging information was found for the patient.  INTERVAL HISTORY:  [name], a 66 y.o. female, returns for routine follow-up of her liver masses/mediastinal and left supraclavicular adenopathy. Brittney Martinez was last seen on 07/20/2021.   Today she reports feeling well. She reports a cough productive of white and green sputum as she is currently being treated for bronchitis. She reports history of RA for the past 13 years which affects mostly her hands and knees; she has previously taken methotrexate and Humira. Her nausea has improved and been helped by Compazine.   REVIEW OF SYSTEMS:  Review of Systems  Constitutional:  Positive for fatigue. Negative for appetite change.  Respiratory:  Positive for cough and shortness of breath.   Gastrointestinal:  Negative for nausea (improved).  All other systems reviewed and are negative.   PAST MEDICAL/SURGICAL HISTORY:  Past Medical History:  Diagnosis Date   Abdominal aortic stenosis    Anal fissure    Anxiety    Asthma    Complication of anesthesia    COPD (chronic obstructive pulmonary disease) ([place])    Depression    grief   Diabetes mellitus    Diverticulitis    GERD (gastroesophageal reflux disease)    Glaucoma    Gout    Hypertension    IBS (irritable bowel syndrome)    PONV (postoperative nausea and vomiting)    RA (rheumatoid arthritis) ([place])    Sleep apnea    had one at one time but not anymore   Stroke ([place])    "mini-stroke"- no  deficits   TIA (transient ischemic attack)    2010. No deficits   Past Surgical History:  Procedure Laterality Date   ABDOMINAL HYSTERECTOMY  1982   complete   BIOPSY  11/17/2018   Procedure: BIOPSY;  Surgeon: Daneil Dolin, MD;  [facility]: [facility];  Service: Endoscopy;;   BREAST LUMPECTOMY     right-benign   CATARACT EXTRACTION W/PHACO Right 05/13/2017   Procedure: CATARACT EXTRACTION PHACO AND INTRAOCULAR LENS PLACEMENT ([place]);  Surgeon: Tonny Branch, MD;  Location: [facility];  Service: Ophthalmology;  Laterality: Right;  CDE: 8.59   CATARACT EXTRACTION W/PHACO Left 05/27/2017   Procedure: CATARACT EXTRACTION WITH  PHACOEMULSIFICATION AND INTRAOCULAR LENS PLACEMENT LEFT EYE;  Surgeon: Tonny Branch, MD;  Location: [facility];  Service: Ophthalmology;  Laterality: Left;  CDE: 5.66   CHOLECYSTECTOMY  2003   COLONOSCOPY  12/14/09   Dr. Gala Romney :anal papilla and hemorrhoids,diminutive hperplastic rectal polyps/normal colon   COLONOSCOPY N/A 10/29/2013   [facility]:TDVV papilla and internal hemorrhoids; colonic polyps-removed as described above. I suspect benign anorectal bleeding in the setting of hemorrhoids and  possibly fissure. tubular adenoma. next TCS 10/2020.   COLONOSCOPY WITH PROPOFOL N/A 11/17/2018   Dr. Gala Romney: Nonbleeding internal hemorrhoids, hemorrhoids were moderate and grade.  Exam otherwise normal.  Colonoscopy in 5 years.   ELBOW SURGERY     right   ESOPHAGOGASTRODUODENOSCOPY  12/14/09   Dr. Gala Romney :schatzkis ring 37F, otherwise normal  ESOPHAGOGASTRODUODENOSCOPY N/A 10/29/2013   RMR: Distal esophageal pseudodiverticulum/Nissen fundoplication   ESOPHAGOGASTRODUODENOSCOPY (EGD) WITH PROPOFOL N/A 11/17/2018   Dr. Gala Romney: Normal esophagus status post dilation.  Nonbleeding erosive gastropathy with benign biopsy.   FINGER SURGERY     on right middle, pointer, and index fingers   FISSURECTOMY     several   INCISIONAL HERNIA REPAIR N/A 03/22/2014   Procedure: [name] HERNIORRHAPHY;  Surgeon:  Jamesetta So, MD;  Location: [facility];  Service: General;  Laterality: N/A;   INSERTION OF MESH N/A 03/22/2014   Procedure: INSERTION OF MESH;  Surgeon: Jamesetta So, MD;  Location: [facility];  Service: General;  Laterality: N/A;   MALONEY DILATION N/A 11/17/2018   Procedure: [name] DILATION;  Surgeon: Daneil Dolin, MD;  Location: [facility];  Service: Endoscopy;  Laterality: N/A;   NISSEN FUNDOPLICATION  [id number]   [place]   PORTACATH PLACEMENT Left 07/12/2021   Procedure: INSERTION PORT-A-CATH;  Surgeon: Virl Cagey, MD;  Location: [facility];  Service: General;  Laterality: Left;   WRIST SURGERY     left    SOCIAL HISTORY:  Social History   Socioeconomic History   Marital status: Widowed    Spouse name: Not on file   Number of children: Not on file   Years of education: Not on file   Highest education level: Not on file  Occupational History   Occupation: disabled  Tobacco Use   Smoking status: Former    Packs/day: 0.25    Years: 19.00    Total pack years: 4.75    Types: Cigarettes    Quit date: 01/09/2007    Years since quitting: 14.6   Smokeless tobacco: Never  Vaping Use   Vaping Use: Never used  Substance and Sexual Activity   Alcohol use: No   Drug use: No   Sexual activity: Never  Other Topics Concern   Not on file  Social History Narrative   1 son-healthy   1 daughter-MVA (drunk-driver)   Social Determinants of Health   Financial Resource Strain: Not on file  Food Insecurity: Not on file  Transportation Needs: Not on file  Physical Activity: Not on file  Stress: Not on file  Social Connections: Not on file  Intimate Partner Violence: Not on file    FAMILY HISTORY:  Family History  Problem Relation Age of Onset   Colon cancer Father 54   Hypertension Sister    Hypertension Brother    Kidney disease Brother     CURRENT MEDICATIONS:  Current Outpatient Medications  Medication Sig Dispense Refill   albuterol (PROVENTIL HFA;VENTOLIN HFA)  108 (90 BASE) MCG/ACT inhaler Inhale 2 puffs into the lungs every 6 (six) hours as needed for wheezing or shortness of breath.     albuterol (PROVENTIL) (2.5 MG/3ML) 0.083% nebulizer solution Take 2.5 mg by nebulization every 6 (six) hours as needed for wheezing or shortness of breath.     allopurinol (ZYLOPRIM) 300 MG tablet Take 300 mg by mouth daily.     amLODipine (NORVASC) 5 MG tablet Take 5 mg by mouth every morning.      aspirin 81 MG tablet Take 81 mg by mouth daily.     atorvastatin (LIPITOR) 20 MG tablet Take 20 mg by mouth at bedtime.     Biotin w/ Vitamins C & E (HAIR/SKIN/NAILS PO) Take 1 tablet by mouth daily.     budesonide-formoterol (SYMBICORT) 160-4.5 MCG/ACT inhaler Inhale 2 puffs into the lungs in the morning  and at bedtime.     cholecalciferol (VITAMIN D3) 25 MCG (1000 UNIT) tablet Take 1,000 Units by mouth daily.     clopidogrel (PLAVIX) 75 MG tablet Take 75 mg by mouth daily.     cycloSPORINE (RESTASIS) 0.05 % ophthalmic emulsion Place 1 drop into both eyes 2 (two) times daily.     diphenhydrAMINE (BENADRYL) 25 MG tablet Take 25 mg by mouth daily as needed for allergies.     doxepin (SINEQUAN) 75 MG capsule Take 75 mg by mouth at bedtime.     esomeprazole (NEXIUM) 40 MG capsule Take 40 mg by mouth in the morning and at bedtime.     glipiZIDE (GLUCOTROL XL) 2.5 MG 24 hr tablet Take 2.5 mg by mouth daily with breakfast.     hyoscyamine (LEVSIN SL) 0.125 MG SL tablet Place 1 tablet (0.125 mg total) under the tongue every 6 (six) hours as needed for cramping. 90 tablet 0   ibuprofen (ADVIL) 200 MG tablet Take 400 mg by mouth every 6 (six) hours as needed for moderate pain.     insulin detemir (LEVEMIR) 100 UNIT/ML injection Inject 10 Units into the skin daily.     lubiprostone (AMITIZA) 8 MCG capsule TAKE 1 CAPSULE BY MOUTH  ONCE DAILY WITH BREAKFAST (Patient taking differently: Take 8 mcg by mouth daily as needed for constipation.) 90 capsule 3   MELATONIN PO Take 1 capsule by  mouth at bedtime as needed (sleep).     methocarbamol (ROBAXIN) 500 MG tablet Take 500 mg by mouth every 6 (six) hours as needed for muscle spasms.     metoprolol (TOPROL-XL) 100 MG 24 hr tablet Take 100 mg by mouth every morning.     predniSONE (DELTASONE) 10 MG tablet Take 10 mg by mouth daily.     prochlorperazine (COMPAZINE) 10 MG tablet Take 1 tablet (10 mg total) by mouth every 6 (six) hours as needed for nausea or vomiting. 30 tablet 3   traMADol (ULTRAM) 50 MG tablet Take 50-100 mg by mouth every 6 (six) hours as needed for moderate pain.     No current facility-administered medications for this visit.    ALLERGIES:  Allergies  Allergen Reactions   Lisinopril Anaphylaxis    Closing of throat   Aspirin Nausea Only   Propoxyphene N-Acetaminophen Nausea And Vomiting   Zithromax [Azithromycin] Nausea Only   Erythromycin Rash    PHYSICAL EXAM:  Performance status (ECOG): 1 - Symptomatic but completely ambulatory  There were no vitals filed for this visit. Wt Readings from Last 3 Encounters:  08/14/21 182 lb (82.6 kg)  07/20/21 174 lb 4.8 oz (79.1 kg)  07/06/21 174 lb (78.9 kg)   Physical Exam Vitals reviewed.  Constitutional:      Appearance: Normal appearance. She is obese.  Cardiovascular:     Rate and Rhythm: Normal rate and regular rhythm.     Pulses: Normal pulses.     Heart sounds: Normal heart sounds.  Pulmonary:     Effort: Pulmonary effort is normal.     Breath sounds: Normal breath sounds.  Neurological:     General: No focal deficit present.     Mental Status: She is alert and oriented to person, place, and time.  Psychiatric:        Mood and Affect: Mood normal.        Behavior: Behavior normal.      LABORATORY DATA:  I have reviewed the labs as listed.     Latest Ref  Rng & Units 06/29/2021   10:49 AM 06/22/2021   10:56 AM 06/07/2021    2:19 PM  CBC  WBC 4.0 - 10.5 K/uL 7.7  5.6  5.2   Hemoglobin 12.0 - 15.0 g/dL 15.2  13.5  15.2   Hematocrit  36.0 - 46.0 % 46.6  41.9  47.4   Platelets 150 - 400 K/uL 250  184  199       Latest Ref Rng & Units 06/29/2021   10:49 AM 06/07/2021    2:19 PM 05/24/2021   12:37 PM  CMP  Glucose 70 - 99 mg/dL 84  169    BUN 8 - 23 mg/dL 13  10    Creatinine 0.44 - 1.00 mg/dL 0.69  0.89  0.90   Sodium 135 - 145 mmol/L 143  141    Potassium 3.5 - 5.1 mmol/L 3.6  4.6    Chloride 98 - 111 mmol/L 108  108    CO2 22 - 32 mmol/L 26  25    Calcium 8.9 - 10.3 mg/dL 9.5  9.9    Total Protein 6.5 - 8.1 g/dL 7.9  7.5    Total Bilirubin 0.3 - 1.2 mg/dL 0.6  0.7    Alkaline Phos 38 - 126 U/L 52  55    AST 15 - 41 U/L 30  46    ALT 0 - 44 U/L 37  54      DIAGNOSTIC IMAGING:  I have independently reviewed the scans and discussed with the patient. [place] CORE BIOPSY (LYMPH NODES)  Result Date: 08/15/2021 INDICATION: Concern for malignancy EXAM: Ultrasound-guided core needle biopsy of left supraclavicular lymph node MEDICATIONS: None. ANESTHESIA/SEDATION: Moderate (conscious) sedation was employed during this procedure. A total of Versed 1.5 mg and Fentanyl 50 mcg was administered intravenously by the radiology nurse. Total intra-service moderate Sedation Time: 24 minutes. The patient's level of consciousness and vital signs were monitored continuously by radiology nursing throughout the procedure under my direct supervision. COMPLICATIONS: None immediate. PROCEDURE: Informed written consent was obtained from the patient after a thorough discussion of the procedural risks, benefits and alternatives. All questions were addressed. Maximal Sterile Barrier Technique was utilized including caps, mask, sterile gowns, sterile gloves, sterile drape, hand hygiene and skin antiseptic. A timeout was performed prior to the initiation of the procedure. The patient was placed supine on the exam table. Limited ultrasound of the left lower neck and supraclavicular regions were performed, which again demonstrated an enlarged left  supraclavicular lymph node. Skin entry site was marked, and the overlying skin was prepped and draped in the standard sterile fashion. Local analgesia was obtained with 1% lidocaine. Using ultrasound guidance, core needle biopsy was performed of the enlarged lymph node using an 18 gauge core biopsy device x8 total passes. Specimens were submitted in saline to pathology for further handling. Limited postprocedure imaging demonstrated no hematoma. A clean dressing was placed after manual hemostasis. The patient tolerated the procedure well without immediate complication. IMPRESSION: Successful ultrasound-guided core needle biopsy of enlarged left supraclavicular lymph node. Electronically Signed   By: Albin Felling M.D.   On: 08/15/2021 10:24     ASSESSMENT:  Liver masses/mediastinal and left supraclavicular adenopathy: - She has felt left supraclavicular lymph node since November 2022. - She reported 26 pound weight loss in the last 6 to 8 months.  She reports she has been on Ozempic for the last 8 months.  Denies any fevers or night sweats although reports severe tiredness which is  worsening.  No new pains. - She had a hysterectomy at age 60 due to cancer.  Unclear uterine or cervical.  Did not require any further therapies. - EGD/colonoscopy on 11/17/2018: Nonbleeding internal hemorrhoids, normal colon, normal esophagus, nonbleeding erosive gastropathy, normal duodenum. - CT soft tissue neck (05/24/2021): Cluster of mildly prominent lymph nodes in the lower neck bilaterally left greater than right. - CT CAP (05/24/2021): Mediastinal and left hilar adenopathy.  Numerous enhancing low-density lesions throughout the liver measuring up to 4.1 cm. - Liver biopsy (06/22/2021): Poorly differentiated carcinoma with necrosis.  IHC strong positivity for CK5/6, p63, PAX8.  Ki-67 with high proliferative rate.  Negative for CK7, CD56, CK20, chromogranin, synaptophysin, TTF-1, Napsin A, marked 31, ER, GATA3 and CDX2.   Differential includes renal or gynecological primary. - PET scan (06/22/2021): Hypermetabolic lymphadenopathy throughout the neck, chest, liver metastasis, 9 mm hypermetabolic lesion in the lower pole of the left kidney suspicious for small RCC. - Left supraclavicular lymph node biopsy: Metastatic poorly differentiated carcinoma, suggestive of squamous cell carcinoma.  Tumor positive for CK5/6 and p63. - NGS testing: HER2 negative.  T p53 pathogenic variant present.  TMB-low.  MSI-stable.  LOH-high at 23%.  PD-L1 (AY301) negative. - NGS test suggest 97% probability of squamous cell carcinoma. - Based on imaging, this appears most likely squamous cell carcinoma of the lung origin.    Social/family history: - She lives at home with her son and daughter-in-law.  She has been on disability secondary to IBS since 2001.  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  She is a current active smoker, 1/3 pack/day for the last 30 years.   PLAN:  Metastatic squamous cell lung cancer to the liver: -We discussed results of the left supraclavicular lymph node biopsy which shows metastatic poorly differentiated squamous cell carcinoma. - We discussed NGS test results which did not show any evidence of targetable mutations.  NGS test also suggested high probability of squamous cell carcinoma. - Based on imaging, it appears most likely squamous cell carcinoma of the lung origin. - We discussed chemotherapy with palliative intent. - Because of her history of rheumatoid arthritis, I would be reluctant to consider adding Keytruda at this time.  We will consider it in the future with caution. - We discussed chemotherapy regimen containing carboplatin and paclitaxel every 3 weeks for 3 cycles followed by repeat imaging.  In the absence of immunotherapy addition, will consider a total of 6 cycles. - We discussed side effects in detail.  Will likely plan on starting her next week.  2.   Intermittent nausea: -Continue Compazine every 6 hours as needed which is helping.  3.  Rheumatoid arthritis of hands and knees: - She has rheumatoid arthritis for 13 years affecting hands and knees. - She was previously treated with methotrexate and Humira. - Now she is off of therapy.   Orders placed this encounter:  No orders of the defined types were placed in this encounter.    Derek Jack, MD [place] [phone]   I, Thana Ates, am acting as a scribe for Dr. Derek Jack.  I, Derek Jack MD, have reviewed the above documentation for accuracy and completeness, and I agree with the above.

## 2021-09-17 NOTE — Patient Instructions (Signed)
Bowman will see the doctor regularly throughout treatment.  We will obtain blood work from you prior to every treatment and monitor your results to make sure it is safe to give your treatment. The doctor monitors your response to treatment by the way you are feeling, your blood work, and by obtaining scans periodically.  There will be wait times while you are here for treatment.  It will take about 30 minutes to 1 hour for your lab work to result.  Then there will be wait times while pharmacy mixes your medications.   Medications you will receive in the clinic prior to your chemotherapy medications:   Aloxi:  ALOXI is used in adults to help prevent the nausea and vomiting that happens with certain chemotherapy drugs.  Aloxi is a long acting medication, and will remain in your system for about 2 days.    Dexamethasone:  This is a steroid given prior to chemotherapy to help prevent allergic reactions; it may also help prevent and control nausea and diarrhea.    Pepcid:  This medication is a histamine blocker that helps prevent and allergic reaction to your chemotherapy.    Benadryl:  This is a histamine blocker (different from the Pepcid) that helps prevent allergic/infusion reactions to your chemotherapy. This medication may cause dizziness/drowsiness.      Paclitaxel (Taxol)  About This Drug Paclitaxel is a drug used to treat cancer. It is given in the vein (IV).  This will take 1 hour to infuse.  This first infusion will take longer to infuse because it is increased slowly to monitor for reactions.  The nurse will be in the room with you for the first 15 minutes of the first infusion.  Possible Side Effects   Hair loss. Hair loss is often temporary, although with certain medicine, hair loss can sometimes be permanent. Hair loss may happen suddenly or gradually. If you lose hair, you may lose it from your head, face, armpits, pubic area, chest,  and/or legs. You may also notice your hair getting thin.   Swelling of your legs, ankles and/or feet (edema)   Flushing   Nausea and throwing up (vomiting)   Loose bowel movements (diarrhea)   Bone marrow depression. This is a decrease in the number of white blood cells, red blood cells, and platelets. This may raise your risk of infection, make you tired and weak (fatigue), and raise your risk of bleeding.   Effects on the nerves are called peripheral neuropathy. You may feel numbness, tingling, or pain in your hands and feet. It may be hard for you to button your clothes, open jars, or walk as usual. The effect on the nerves may get worse with more doses of the drug. These effects get better in some people after the drug is stopped but it does not get better in all people.   Changes in your liver function   Bone, joint and muscle pain   Abnormal EKG   Allergic reaction: Allergic reactions, including anaphylaxis are rare but may happen in some patients. Signs of allergic reaction to this drug may be swelling of the face, feeling like your tongue or throat are swelling, trouble breathing, rash, itching, fever, chills, feeling dizzy, and/or feeling that your heart is beating in a fast or not normal way. If this happens, do not take another dose of this drug. You should get urgent medical treatment.   Infection   Changes in your  kidney function.  Note: Each of the side effects above was reported in 20% or greater of patients treated with paclitaxel. Not all possible side effects are included above.  Warnings and Precautions   Severe allergic reactions   Severe bone marrow depression  Treating Side Effects   To help with hair loss, wash with a mild shampoo and avoid washing your hair every day.   Avoid rubbing your scalp, instead, pat your hair or scalp dry   Avoid coloring your hair   Limit your use of hair spray, electric curlers, blow dryers, and curling irons.   If you  are interested in getting a wig, talk to your nurse. You can also call the [facility] at 800-ACS-2345 to find out information about the "Look Good, Feel Better" program close to where you live. It is a free program where women getting chemotherapy can learn about wigs, turbans and scarves as well as makeup techniques and skin and nail care.   Ask your doctor or nurse about medicines that are available to help stop or lessen diarrhea and/or nausea.   To help with nausea and vomiting, eat small, frequent meals instead of three large meals a day. Choose foods and drinks that are at room temperature. Ask your nurse or doctor about other helpful tips and medicine that is available to help or stop lessen these symptoms.   If you get diarrhea, eat low-fiber foods that are high in protein and calories and avoid foods that can irritate your digestive tracts or lead to cramping. Ask your nurse or doctor about medicine that can lessen or stop your diarrhea.   Mouth care is very important. Your mouth care should consist of routine, gentle cleaning of your teeth or dentures and rinsing your mouth with a mixture of 1/2 teaspoon of salt in 8 ounces of water or  teaspoon of baking soda in 8 ounces of water. This should be done at least after each meal and at bedtime.   If you have mouth sores, avoid mouthwash that has alcohol. Also avoid alcohol and smoking because they can bother your mouth and throat.   Drink plenty of fluids (a minimum of eight glasses per day is recommended).   Take your temperature as your doctor or nurse tells you, and whenever you feel like you may have a fever.   Talk to your doctor or nurse about precautions you can take to avoid infections and bleeding.   Be careful when cooking, walking, and handling sharp objects and hot liquids.  Food and Drug Interactions   There are no known interactions of paclitaxel with food.   This drug may interact with other medicines.  Tell your doctor and pharmacist about all the medicines and dietary supplements (vitamins, minerals, herbs and others) that you are taking at this time.   The safety and use of dietary supplements and alternative diets are often not known. Using these might affect your cancer or interfere with your treatment. Until more is known, you should not use dietary supplements or alternative diets without your cancer doctor's help.  When to Call the Doctor  Call your doctor or nurse if you have any of the following symptoms and/or any new or unusual symptoms:   Fever of 100.4 F (38 C) or above   Chills   Redness, pain, warmth, or swelling at the IV site during the infusion   Signs of allergic reaction: swelling of the face, feeling like your tongue or throat are  swelling, trouble breathing, rash, itching, fever, chills, feeling dizzy, and/or feeling that your heart is beating in a fast or not normal way   Feeling that your heart is beating in a fast or not normal way (palpitations)   Weight gain of 5 pounds in one week (fluid retention)   Decreased urine or very dark urine   Signs of liver problems: dark urine, pale bowel movements, bad stomach pain, feeling very tired and weak, unusual  itching, or yellowing of the eyes or skin   Heavy menstrual period that lasts longer than normal   Easy bruising or bleeding   Nausea that stops you from eating or drinking, and/or that is not relieved by prescribed medicines.   Loose bowel movements (diarrhea) more than 4 times a day or diarrhea with weakness or lightheadedness   Pain in your mouth or throat that makes it hard to eat or drink   Lasting loss of appetite or rapid weight loss of five pounds in a week   Signs of peripheral neuropathy: numbness, tingling, or decreased feeling in fingers or toes; trouble walking or changes in the way you walk; or feeling clumsy when buttoning clothes, opening jars, or other routine activities   Joint and  muscle pain that is not relieved by prescribed medicines   Extreme fatigue that interferes with normal activities   While you are getting this drug, please tell your nurse right away if you have any pain, redness, or swelling at the site of the IV infusion.   If you think you are pregnant.  Reproduction Warnings   Pregnancy warning: This drug may have harmful effects on the unborn child, it is recommended that effective methods of birth control should be used during your cancer treatment. Let your doctor know right away if you think you may be pregnant.   Breast feeding warning: Women should not breast feed during treatment because this drug could enter the breastmilk and cause harm to a breast feeding baby.   Carboplatin (Paraplatin, CBDCA)  About This Drug  Carboplatin is used to treat cancer. It is given in the vein (IV).  It will take 30 minutes to infuse.   Possible Side Effects   Bone marrow suppression. This is a decrease in the number of white blood cells, red blood cells, and platelets. This may raise your risk of infection, make you tired and weak (fatigue), and raise your risk of bleeding.   Nausea and vomiting (throwing up)   Weakness   Changes in your liver function   Changes in your kidney function   Electrolyte changes   Pain  Note: Each of the side effects above was reported in 20% or greater of patients treated with carboplatin. Not all possible side effects are included above.   Warnings and Precautions   Severe bone marrow suppression   Allergic reactions, including anaphylaxis are rare but may happen in some patients. Signs of allergic reaction to this drug may be swelling of the face, feeling like your tongue or throat are swelling, trouble breathing, rash, itching, fever, chills, feeling dizzy, and/or feeling that your heart is beating in a fast or not normal way. If this happens, do not take another dose of this drug. You should get urgent medical  treatment.   Severe nausea and vomiting   Effects on the nerves are called peripheral neuropathy. This risk is increased if you are over the age of 99 or if you have received other medicine with risk of peripheral  neuropathy. You may feel numbness, tingling, or pain in your hands and feet. It may be hard for you to button your clothes, open jars, or walk as usual. The effect on the nerves may get worse with more doses of the drug. These effects get better in some people after the drug is stopped but it does not get better in all people.   Blurred vision, loss of vision or other changes in eyesight   Decreased hearing   - Skin and tissue irritation including redness, pain, warmth, or swelling at the IV site if the drug leaks out of the vein and into nearby tissue.   Severe changes in your kidney function, which can cause kidney failure   Severe changes in your liver function, which can cause liver failure  Note: Some of the side effects above are very rare. If you have concerns and/or questions, please discuss them with your medical team.   Important Information   This drug may be present in the saliva, tears, sweat, urine, stool, vomit, semen, and vaginal secretions. Talk to your doctor and/or your nurse about the necessary precautions to take during this time.   Treating Side Effects   Manage tiredness by pacing your activities for the day.   Be sure to include periods of rest between energy-draining activities.   To decrease the risk of infection, wash your hands regularly.   Avoid close contact with people who have a cold, the flu, or other infections.   Take your temperature as your doctor or nurse tells you, and whenever you feel like you may have a fever.   To help decrease the risk of bleeding, use a soft toothbrush. Check with your nurse before using dental floss.   Be very careful when using knives or tools.   Use an electric shaver instead of a razor.   Drink  plenty of fluids (a minimum of eight glasses per day is recommended).   If you throw up or have loose bowel movements, you should drink more fluids so that you do not become dehydrated (lack of water in the body from losing too much fluid).   To help with nausea and vomiting, eat small, frequent meals instead of three large meals a day. Choose foods and drinks that are at room temperature. Ask your nurse or doctor about other helpful tips and medicine that is available to help stop or lessen these symptoms.   If you have numbness and tingling in your hands and feet, be careful when cooking, walking, and handling sharp objects and hot liquids.   Keeping your pain under control is important to your well-being. Please tell your doctor or nurse if you are experiencing pain.   Food and Drug Interactions   There are no known interactions of carboplatin with food.   This drug may interact with other medicines. Tell your doctor and pharmacist about all the prescription and over-the-counter medicines and dietary supplements (vitamins, minerals, herbs and others) that you are taking at this time. Also, check with your doctor or pharmacist before starting any new prescription or over-the-counter medicines, or dietary supplements to make sure that there are no interactions.   When to Call the Doctor  Call your doctor or nurse if you have any of these symptoms and/or any new or unusual symptoms:   Fever of 100.4 F (38 C) or higher   Chills   Tiredness that interferes with your daily activities   Feeling dizzy or lightheaded   Easy  bleeding or bruising   Nausea that stops you from eating or drinking and/or is not relieved by prescribed medicines   Throwing up   Blurred vision or other changes in eyesight   Decrease in hearing or ringing in the ear   Signs of allergic reaction: swelling of the face, feeling like your tongue or throat are swelling, trouble breathing, rash, itching, fever,  chills, feeling dizzy, and/or feeling that your heart is beating in a fast or not normal way. If this happens, call 911 for emergency care.   Signs of possible liver problems: dark urine, pale bowel movements, bad stomach pain, feeling very tired and weak, unusual itching, or yellowing of the eyes or skin   Decreased urine, or very dark urine   Numbness, tingling, or pain in your hands and feet   Pain that does not go away or is not relieved by prescribed medicine   While you are getting this drug, please tell your nurse right away if you have any pain, redness, or swelling at the site of the IV infusion, or if you have any new onset of symptoms, or if you just feel "different" from before when the infusion was started.   Reproduction Warnings   Pregnancy warning: This drug may have harmful effects on the unborn baby. Women of child bearing potential should use effective methods of birth control during your cancer treatment. Let your doctor know right away if you think you may be pregnant.   Breastfeeding warning: It is not known if this drug passes into breast milk. For this reason, women should not breastfeed during treatment because this drug could enter the breast milk and cause harm to a breastfeeding baby.   Fertility warning: Human fertility studies have not been done with this drug. Talk with your doctor or nurse if you plan to have children. Ask for information on sperm or egg banking.   SELF CARE ACTIVITIES WHILE RECEIVING CHEMOTHERAPY:  Hydration Increase your fluid intake 48 hours prior to treatment and drink at least 8 to 12 cups (64 ounces) of water/decaffeinated beverages per day after treatment. You can still have your cup of coffee or soda but these beverages do not count as part of your 8 to 12 cups that you need to drink daily. No alcohol intake.  Medications Continue taking your normal prescription medication as prescribed.  If you start any new herbal or new  supplements please let us know first to make sure it is safe.  Mouth Care Have teeth cleaned professionally before starting treatment. Keep dentures and partial plates clean. Use soft toothbrush and do not use mouthwashes that contain alcohol. Biotene is a good mouthwash that is available at most pharmacies or may be ordered by calling [phone]. Use warm salt water gargles (1 teaspoon salt per 1 quart warm water) before and after meals and at bedtime. If you need dental work, please let the doctor know before you go for your appointment so that we can coordinate the best possible time for you in regards to your chemo regimen. You need to also let your dentist know that you are actively taking chemo. We may need to do labs prior to your dental appointment.  Skin Care Always use sunscreen that has not expired and with SPF (Sun Protection Factor) of 50 or higher. Wear hats to protect your head from the sun. Remember to use sunscreen on your hands, ears, face, & feet.  Use good moisturizing lotions such as udder cream,  eucerin, or even Vaseline. Some chemotherapies can cause dry skin, color changes in your skin and nails.    Avoid long, hot showers or baths. Use gentle, fragrance-free soaps and laundry detergent. Use moisturizers, preferably creams or ointments rather than lotions because the thicker consistency is better at preventing skin dehydration. Apply the cream or ointment within 15 minutes of showering. Reapply moisturizer at night, and moisturize your hands every time after you wash them.  Hair Loss (if your doctor says your hair will fall out)  If your doctor says that your hair is likely to fall out, decide before you begin chemo whether you want to wear a wig. You may want to shop before treatment to match your hair color. Hats, turbans, and scarves can also camouflage hair loss, although some people prefer to leave their heads uncovered. If you go bare-headed outdoors, be sure to use  sunscreen on your scalp. Cut your hair short. It eases the inconvenience of shedding lots of hair, but it also can reduce the emotional impact of watching your hair fall out. Don't perm or color your hair during chemotherapy. Those chemical treatments are already damaging to hair and can enhance hair loss. Once your chemo treatments are done and your hair has grown back, it's OK to resume dyeing or perming hair.  With chemotherapy, hair loss is almost always temporary. But when it grows back, it may be a different color or texture. In older adults who still had hair color before chemotherapy, the new growth may be completely gray.  Often, new hair is very fine and soft.  Infection Prevention Please wash your hands for at least 30 seconds using warm soapy water. Handwashing is the #1 way to prevent the spread of germs. Stay away from sick people or people who are getting over a cold. If you develop respiratory systems such as green/yellow mucus production or productive cough or persistent cough let us know and we will see if you need an antibiotic. It is a good idea to keep a pair of gloves on when going into grocery stores/[employer] to decrease your risk of coming into contact with germs on the carts, etc. Carry alcohol hand gel with you at all times and use it frequently if out in public. If your temperature reaches 100.4 or higher please call the clinic and let us know.  If it is after hours or on the weekend please go to the ER if your temperature is over 100.4.  Please have your own personal thermometer at home to use.    Sex and bodily fluids If you are going to have sex, a condom must be used to protect the person that isn't taking chemotherapy. Chemo can decrease your libido (sex drive). For a few days after chemotherapy, chemotherapy can be excreted through your bodily fluids.  When using the toilet please close the lid and flush the toilet twice.  Do this for a few day after you have had  chemotherapy.   Effects of chemotherapy on your sex life Some changes are simple and won't last long. They won't affect your sex life permanently.  Sometimes you may feel: too tired not strong enough to be very active sick or sore  not in the mood anxious or low  Your anxiety might not seem related to sex. For example, you may be worried about the cancer and how your treatment is going. Or you may be worried about money, or about how you family are coping with your  illness.  These things can cause stress, which can affect your interest in sex. It's important to talk to your partner about how you feel.  Remember - the changes to your sex life don't usually last long. There's usually no medical reason to stop having sex during chemo. The drugs won't have any long term physical effects on your performance or enjoyment of sex. Cancer can't be passed on to your partner during sex  Contraception It's important to use reliable contraception during treatment. Avoid getting pregnant while you or your partner are having chemotherapy. This is because the drugs may harm the baby. Sometimes chemotherapy drugs can leave a man or woman infertile.  This means you would not be able to have children in the future. You might want to talk to someone about permanent infertility. It can be very difficult to learn that you may no longer be able to have children. Some people find counselling helpful. There might be ways to preserve your fertility, although this is easier for men than for women. You may want to speak to a fertility expert. You can talk about sperm banking or harvesting your eggs. You can also ask about other fertility options, such as donor eggs. If you have or have had breast cancer, your doctor might advise you not to take the contraceptive pill. This is because the hormones in it might affect the cancer. It is not known for sure whether or not chemotherapy drugs can be passed on through semen or secretions  from the vagina. Because of this some doctors advise people to use a barrier method if you have sex during treatment. This applies to vaginal, anal or oral sex. Generally, doctors advise a barrier method only for the time you are actually having the treatment and for about a week after your treatment. Advice like this can be worrying, but this does not mean that you have to avoid being intimate with your partner. You can still have close contact with your partner and continue to enjoy sex.  Animals If you have cats or birds we just ask that you not change the litter or change the cage.  Please have someone else do this for you while you are on chemotherapy.   Food Safety During and After Cancer Treatment Food safety is important for people both during and after cancer treatment. Cancer and cancer treatments, such as chemotherapy, radiation therapy, and stem cell/bone marrow transplantation, often weaken the immune system. This makes it harder for your body to protect itself from foodborne illness, also called food poisoning. Foodborne illness is caused by eating food that contains harmful bacteria, parasites, or viruses.  Foods to avoid Some foods have a higher risk of becoming tainted with bacteria. These include: Unwashed fresh fruit and vegetables, especially leafy vegetables that can hide dirt and other contaminants Raw sprouts, such as alfalfa sprouts Raw or undercooked beef, especially ground beef, or other raw or undercooked meat and poultry Fatty, fried, or spicy foods immediately before or after treatment.  These can sit heavy on your stomach and make you feel nauseous. Raw or undercooked shellfish, such as oysters. Sushi and sashimi, which often contain raw fish.  Unpasteurized beverages, such as unpasteurized fruit juices, raw milk, raw yogurt, or cider Undercooked eggs, such as soft boiled, over easy, and poached; raw, unpasteurized eggs; or foods made with raw egg, such as homemade raw  cookie dough and homemade mayonnaise  Simple steps for food safety  Shop smart. Do not buy food  stored or displayed in an unclean area. Do not buy bruised or damaged fruits or vegetables. Do not buy cans that have cracks, dents, or bulges. Pick up foods that can spoil at the end of your shopping trip and store them in a cooler on the way home.  Prepare and clean up foods carefully. Rinse all fresh fruits and vegetables under running water, and dry them with a clean towel or paper towel. Clean the top of cans before opening them. After preparing food, wash your hands for 20 seconds with hot water and soap. Pay special attention to areas between fingers and under nails. Clean your utensils and dishes with hot water and soap. Disinfect your kitchen and cutting boards using 1 teaspoon of liquid, unscented bleach mixed into 1 quart of water.    Dispose of old food. Eat canned and packaged food before its expiration date (the "use by" or "best before" date). Consume refrigerated leftovers within 3 to 4 days. After that time, throw out the food. Even if the food does not smell or look spoiled, it still may be unsafe. Some bacteria, such as Listeria, can grow even on foods stored in the refrigerator if they are kept for too long.  Take precautions when eating out. At restaurants, avoid buffets and salad bars where food sits out for a long time and comes in contact with many people. Food can become contaminated when someone with a virus, often a norovirus, or another "bug" handles it. Put any leftover food in a "to-go" container yourself, rather than having the server do it. And, refrigerate leftovers as soon as you get home. Choose restaurants that are clean and that are willing to prepare your food as you order it cooked.   AT HOME MEDICATIONS:                                                                                                                                                                 Compazine/Prochlorperazine 10mg  tablet. Take 1 tablet every 6 hours as needed for nausea/vomiting. (This can make you sleepy)   EMLA cream. Apply a quarter size amount to port site 1 hour prior to chemo. Do not rub in. Cover with plastic wrap.    Diarrhea Sheet   If you are having loose stools/diarrhea, please purchase Imodium and begin taking as outlined:  At the first sign of poorly formed or loose stools you should begin taking Imodium (loperamide) 2 mg capsules.  Take two tablets (4mg ) followed by one tablet (2mg ) every 2 hours - DO NOT EXCEED 8 tablets in 24 hours.  If it is bedtime and you are having loose stools, take 2 tablets at bedtime, then 2 tablets every 4 hours until morning.   Always call the [facility]  if you are having loose stools/diarrhea that you can't get under control.  Loose stools/diarrhea leads to dehydration (loss of water) in your body.  We have other options of trying to get the loose stools/diarrhea to stop but you must let us know!   Constipation Sheet  Colace - 100 mg capsules - take 2 capsules daily.  If this doesn't help then you can increase to 2 capsules twice daily.  Please call if the above does not work for you. Do not go more than 2 days without a bowel movement.  It is very important that you do not become constipated.  It will make you feel sick to your stomach (nausea) and can cause abdominal pain and vomiting.  Nausea Sheet   Compazine/Prochlorperazine 10mg  tablet. Take 1 tablet every 6 hours as needed for nausea/vomiting (This can make you drowsy).  If you are having persistent nausea (nausea that does not stop) please call the [facility] and let us know the amount of nausea that you are experiencing.  If you begin to vomit, you need to call the [facility] and if it is the weekend and you have vomited more than one time and can't get it to stop-go to the Emergency Room.  Persistent nausea/vomiting can lead to dehydration (loss of fluid  in your body) and will make you feel very weak and unwell. Ice chips, sips of clear liquids, foods that are at room temperature, crackers, and toast tend to be better tolerated.   SYMPTOMS TO REPORT AS SOON AS POSSIBLE AFTER TREATMENT:  FEVER GREATER THAN 100.4 F  CHILLS WITH OR WITHOUT FEVER  NAUSEA AND VOMITING THAT IS NOT CONTROLLED WITH YOUR NAUSEA MEDICATION  UNUSUAL SHORTNESS OF BREATH  UNUSUAL BRUISING OR BLEEDING  TENDERNESS IN MOUTH AND THROAT WITH OR WITHOUT PRESENCE OF ULCERS  URINARY PROBLEMS  BOWEL PROBLEMS  UNUSUAL RASH      Wear comfortable clothing and clothing appropriate for easy access to any Portacath or PICC line. Let us know if there is anything that we can do to make your therapy better!    What to do if you need assistance after hours or on the weekends: CALL [phone].  HOLD on the line, do not hang up.  You will hear multiple messages but at the end you will be connected with a nurse triage line.  They will contact the doctor if necessary.  Most of the time they will be able to assist you.  Do not call the hospital operator.      I have been informed and understand all of the instructions given to me and have received a copy. I have been instructed to call the clinic [phone] or my family physician as soon as possible for continued medical care, if indicated. I do not have any more questions at this time but understand that I may call the [facility] or the Patient Navigator at [phone] during office hours should I have questions or need assistance in obtaining follow-up care.

## 2021-09-19 ENCOUNTER — Encounter (HOSPITAL_COMMUNITY): Payer: Self-pay | Admitting: Hematology

## 2021-09-19 ENCOUNTER — Other Ambulatory Visit (HOSPITAL_COMMUNITY): Payer: Self-pay | Admitting: *Deleted

## 2021-09-19 ENCOUNTER — Inpatient Hospital Stay (HOSPITAL_COMMUNITY): Payer: Medicare Other

## 2021-09-19 ENCOUNTER — Encounter (HOSPITAL_COMMUNITY): Payer: Self-pay

## 2021-09-19 DIAGNOSIS — G620 Drug-induced polyneuropathy: Secondary | ICD-10-CM | POA: Diagnosis not present

## 2021-09-19 DIAGNOSIS — Z79899 Other long term (current) drug therapy: Secondary | ICD-10-CM | POA: Diagnosis not present

## 2021-09-19 DIAGNOSIS — B370 Candidal stomatitis: Secondary | ICD-10-CM | POA: Diagnosis not present

## 2021-09-19 DIAGNOSIS — C787 Secondary malignant neoplasm of liver and intrahepatic bile duct: Secondary | ICD-10-CM | POA: Diagnosis not present

## 2021-09-19 DIAGNOSIS — R197 Diarrhea, unspecified: Secondary | ICD-10-CM | POA: Diagnosis not present

## 2021-09-19 DIAGNOSIS — M069 Rheumatoid arthritis, unspecified: Secondary | ICD-10-CM | POA: Diagnosis not present

## 2021-09-19 DIAGNOSIS — T451X5A Adverse effect of antineoplastic and immunosuppressive drugs, initial encounter: Secondary | ICD-10-CM | POA: Diagnosis not present

## 2021-09-19 DIAGNOSIS — K589 Irritable bowel syndrome without diarrhea: Secondary | ICD-10-CM | POA: Diagnosis not present

## 2021-09-19 DIAGNOSIS — Z800 Family history of malignant neoplasm of digestive organs: Secondary | ICD-10-CM | POA: Diagnosis not present

## 2021-09-19 DIAGNOSIS — C3490 Malignant neoplasm of unspecified part of unspecified bronchus or lung: Secondary | ICD-10-CM

## 2021-09-19 DIAGNOSIS — Z5111 Encounter for antineoplastic chemotherapy: Secondary | ICD-10-CM | POA: Diagnosis not present

## 2021-09-19 DIAGNOSIS — R110 Nausea: Secondary | ICD-10-CM | POA: Diagnosis not present

## 2021-09-19 DIAGNOSIS — E860 Dehydration: Secondary | ICD-10-CM | POA: Diagnosis not present

## 2021-09-19 DIAGNOSIS — F17210 Nicotine dependence, cigarettes, uncomplicated: Secondary | ICD-10-CM | POA: Diagnosis not present

## 2021-09-19 DIAGNOSIS — I959 Hypotension, unspecified: Secondary | ICD-10-CM | POA: Diagnosis not present

## 2021-09-19 DIAGNOSIS — D649 Anemia, unspecified: Secondary | ICD-10-CM

## 2021-09-19 DIAGNOSIS — C770 Secondary and unspecified malignant neoplasm of lymph nodes of head, face and neck: Secondary | ICD-10-CM | POA: Diagnosis not present

## 2021-09-19 DIAGNOSIS — Z5189 Encounter for other specified aftercare: Secondary | ICD-10-CM | POA: Diagnosis not present

## 2021-09-19 DIAGNOSIS — Z95828 Presence of other vascular implants and grafts: Secondary | ICD-10-CM

## 2021-09-19 HISTORY — DX: Presence of other vascular implants and grafts: Z95.828

## 2021-09-19 LAB — COMPREHENSIVE METABOLIC PANEL
ALT: 20 U/L (ref 0–44)
AST: 19 U/L (ref 15–41)
Albumin: 2.3 g/dL — ABNORMAL LOW (ref 3.5–5.0)
Alkaline Phosphatase: 89 U/L (ref 38–126)
Anion gap: 7 (ref 5–15)
BUN: 8 mg/dL (ref 8–23)
CO2: 24 mmol/L (ref 22–32)
Calcium: 8.6 mg/dL — ABNORMAL LOW (ref 8.9–10.3)
Chloride: 108 mmol/L (ref 98–111)
Creatinine, Ser: 0.65 mg/dL (ref 0.44–1.00)
GFR, Estimated: >60 mL/min (ref >60)
Glucose, Bld: 113 mg/dL — ABNORMAL HIGH (ref 70–99)
Potassium: 3.4 mmol/L — ABNORMAL LOW (ref 3.5–5.1)
Sodium: 139 mmol/L (ref 135–145)
Total Bilirubin: 0.4 mg/dL (ref 0.3–1.2)
Total Protein: 7 g/dL (ref 6.5–8.1)

## 2021-09-19 LAB — CBC WITH DIFFERENTIAL/PLATELET
Abs Immature Granulocytes: 0.06 10*3/uL (ref 0.00–0.07)
Basophils Absolute: 0.1 10*3/uL (ref 0.0–0.1)
Basophils Relative: 1 %
Eosinophils Absolute: 0.1 10*3/uL (ref 0.0–0.5)
Eosinophils Relative: 1 %
HCT: 31.5 % — ABNORMAL LOW (ref 36.0–46.0)
Hemoglobin: 9.7 g/dL — ABNORMAL LOW (ref 12.0–15.0)
Immature Granulocytes: 1 %
Lymphocytes Relative: 16 %
Lymphs Abs: 1.6 10*3/uL (ref 0.7–4.0)
MCH: 27.3 pg (ref 26.0–34.0)
MCHC: 30.8 g/dL (ref 30.0–36.0)
MCV: 88.7 fL (ref 80.0–100.0)
Monocytes Absolute: 0.9 10*3/uL (ref 0.1–1.0)
Monocytes Relative: 9 %
Neutro Abs: 7.4 10*3/uL (ref 1.7–7.7)
Neutrophils Relative %: 72 %
Platelets: 381 10*3/uL (ref 150–400)
RBC: 3.55 MIL/uL — ABNORMAL LOW (ref 3.87–5.11)
RDW: 15.5 % (ref 11.5–15.5)
WBC: 10.1 10*3/uL (ref 4.0–10.5)
nRBC: 0 % (ref 0.0–0.2)

## 2021-09-19 LAB — MAGNESIUM: Magnesium: 1.7 mg/dL (ref 1.7–2.4)

## 2021-09-19 MED ORDER — LIDOCAINE-PRILOCAINE 2.5-2.5 % EX CREA: TOPICAL_CREAM | CUTANEOUS | 3 refills | Status: DC

## 2021-09-19 MED ORDER — PROCHLORPERAZINE MALEATE 10 MG PO TABS: 10.0000 mg | ORAL_TABLET | Freq: Four times a day (QID) | ORAL | 1 refills | Status: DC | PRN

## 2021-09-19 NOTE — Progress Notes (Signed)

## 2021-09-20 ENCOUNTER — Inpatient Hospital Stay (HOSPITAL_COMMUNITY): Payer: Medicare Other

## 2021-09-20 VITALS — BP 131/86 | HR 85 | Temp 97.0°F | Resp 20

## 2021-09-20 DIAGNOSIS — R197 Diarrhea, unspecified: Secondary | ICD-10-CM | POA: Diagnosis not present

## 2021-09-20 DIAGNOSIS — Z5189 Encounter for other specified aftercare: Secondary | ICD-10-CM | POA: Diagnosis not present

## 2021-09-20 DIAGNOSIS — I959 Hypotension, unspecified: Secondary | ICD-10-CM | POA: Diagnosis not present

## 2021-09-20 DIAGNOSIS — F17210 Nicotine dependence, cigarettes, uncomplicated: Secondary | ICD-10-CM | POA: Diagnosis not present

## 2021-09-20 DIAGNOSIS — E860 Dehydration: Secondary | ICD-10-CM | POA: Diagnosis not present

## 2021-09-20 DIAGNOSIS — Z800 Family history of malignant neoplasm of digestive organs: Secondary | ICD-10-CM | POA: Diagnosis not present

## 2021-09-20 DIAGNOSIS — C787 Secondary malignant neoplasm of liver and intrahepatic bile duct: Secondary | ICD-10-CM | POA: Diagnosis not present

## 2021-09-20 DIAGNOSIS — C770 Secondary and unspecified malignant neoplasm of lymph nodes of head, face and neck: Secondary | ICD-10-CM | POA: Diagnosis not present

## 2021-09-20 DIAGNOSIS — R110 Nausea: Secondary | ICD-10-CM | POA: Diagnosis not present

## 2021-09-20 DIAGNOSIS — G620 Drug-induced polyneuropathy: Secondary | ICD-10-CM | POA: Diagnosis not present

## 2021-09-20 DIAGNOSIS — T451X5A Adverse effect of antineoplastic and immunosuppressive drugs, initial encounter: Secondary | ICD-10-CM | POA: Diagnosis not present

## 2021-09-20 DIAGNOSIS — Z95828 Presence of other vascular implants and grafts: Secondary | ICD-10-CM

## 2021-09-20 DIAGNOSIS — Z79899 Other long term (current) drug therapy: Secondary | ICD-10-CM | POA: Diagnosis not present

## 2021-09-20 DIAGNOSIS — C3490 Malignant neoplasm of unspecified part of unspecified bronchus or lung: Secondary | ICD-10-CM | POA: Diagnosis not present

## 2021-09-20 DIAGNOSIS — Z5111 Encounter for antineoplastic chemotherapy: Secondary | ICD-10-CM | POA: Diagnosis not present

## 2021-09-20 DIAGNOSIS — B370 Candidal stomatitis: Secondary | ICD-10-CM | POA: Diagnosis not present

## 2021-09-20 DIAGNOSIS — M069 Rheumatoid arthritis, unspecified: Secondary | ICD-10-CM | POA: Diagnosis not present

## 2021-09-20 DIAGNOSIS — K589 Irritable bowel syndrome without diarrhea: Secondary | ICD-10-CM | POA: Diagnosis not present

## 2021-09-20 MED ORDER — SODIUM CHLORIDE 0.9 % IV SOLN
150.0000 mg | Freq: Once | INTRAVENOUS | Status: AC
  Administered 2021-09-20: 150 mg via INTRAVENOUS
  Filled 2021-09-20: qty 150

## 2021-09-20 MED ORDER — SODIUM CHLORIDE 0.9 % IV SOLN: Freq: Once | INTRAVENOUS | Status: AC

## 2021-09-20 MED ORDER — FAMOTIDINE IN NACL 20-0.9 MG/50ML-% IV SOLN
20.0000 mg | Freq: Once | INTRAVENOUS | Status: AC
  Administered 2021-09-20: 20 mg via INTRAVENOUS
  Filled 2021-09-20: qty 50

## 2021-09-20 MED ORDER — SODIUM CHLORIDE 0.9% FLUSH
10.0000 mL | INTRAVENOUS | Status: DC | PRN
  Administered 2021-09-20: 10 mL

## 2021-09-20 MED ORDER — SODIUM CHLORIDE 0.9 % IV SOLN
10.0000 mg | Freq: Once | INTRAVENOUS | Status: AC
  Administered 2021-09-20: 10 mg via INTRAVENOUS
  Filled 2021-09-20: qty 10

## 2021-09-20 MED ORDER — SODIUM CHLORIDE 0.9 % IV SOLN
470.0000 mg | Freq: Once | INTRAVENOUS | Status: AC
  Administered 2021-09-20: 470 mg via INTRAVENOUS
  Filled 2021-09-20: qty 47

## 2021-09-20 MED ORDER — HEPARIN SOD (PORK) LOCK FLUSH 100 UNIT/ML IV SOLN
500.0000 [IU] | Freq: Once | INTRAVENOUS | Status: AC | PRN
  Administered 2021-09-20: 500 [IU]

## 2021-09-20 MED ORDER — PALONOSETRON HCL INJECTION 0.25 MG/5ML
0.2500 mg | Freq: Once | INTRAVENOUS | Status: AC
  Administered 2021-09-20: 0.25 mg via INTRAVENOUS
  Filled 2021-09-20: qty 5

## 2021-09-20 MED ORDER — SODIUM CHLORIDE 0.9 % IV SOLN
175.0000 mg/m2 | Freq: Once | INTRAVENOUS | Status: AC
  Administered 2021-09-20: 330 mg via INTRAVENOUS
  Filled 2021-09-20: qty 55

## 2021-09-20 MED ORDER — DIPHENHYDRAMINE HCL 50 MG/ML IJ SOLN
50.0000 mg | Freq: Once | INTRAMUSCULAR | Status: AC
  Administered 2021-09-20: 50 mg via INTRAVENOUS
  Filled 2021-09-20: qty 1

## 2021-09-20 NOTE — Progress Notes (Signed)
Patient presents today for D1C1 Taxol and Carboplatin per providers order.  Vital signs and labs within parameters for treatment.  Patient has no new complaints at this time.  Taxol and Carboplatin given today per MD orders.  Stable during infusion without adverse affects.  Vital signs stable.  No complaints at this time.  Discharge from clinic ambulatory in stable condition.  Alert and oriented X 3.  Follow up with [facility] as scheduled.

## 2021-09-20 NOTE — Patient Instructions (Signed)
[facility]  Discharge Instructions: Thank you for choosing [facility] to provide your oncology and hematology care.  If you have a lab appointment with the [facility], please come in thru the Main Entrance and check in at the main information desk.  Wear comfortable clothing and clothing appropriate for easy access to any Portacath or PICC line.   We strive to give you quality time with your provider. You may need to reschedule your appointment if you arrive late (15 or more minutes).  Arriving late affects you and other patients whose appointments are after yours.  Also, if you miss three or more appointments without notifying the office, you may be dismissed from the clinic at the provider's discretion.      For prescription refill requests, have your pharmacy contact our office and allow 72 hours for refills to be completed.    Today you received the following chemotherapy and/or immunotherapy agents Carboplatin and Taxol      To help prevent nausea and vomiting after your treatment, we encourage you to take your nausea medication as directed.  BELOW ARE SYMPTOMS THAT SHOULD BE REPORTED IMMEDIATELY: *FEVER GREATER THAN 100.4 F (38 C) OR HIGHER *CHILLS OR SWEATING *NAUSEA AND VOMITING THAT IS NOT CONTROLLED WITH YOUR NAUSEA MEDICATION *UNUSUAL SHORTNESS OF BREATH *UNUSUAL BRUISING OR BLEEDING *URINARY PROBLEMS (pain or burning when urinating, or frequent urination) *BOWEL PROBLEMS (unusual diarrhea, constipation, pain near the anus) TENDERNESS IN MOUTH AND THROAT WITH OR WITHOUT PRESENCE OF ULCERS (sore throat, sores in mouth, or a toothache) UNUSUAL RASH, SWELLING OR PAIN  UNUSUAL VAGINAL DISCHARGE OR ITCHING   Items with * indicate a potential emergency and should be followed up as soon as possible or go to the Emergency Department if any problems should occur.  Please show the CHEMOTHERAPY ALERT CARD or IMMUNOTHERAPY ALERT CARD at check-in to the  Emergency Department and triage nurse.  Should you have questions after your visit or need to cancel or reschedule your appointment, please contact [facility] [phone]  and follow the prompts.  Office hours are 8:00 a.m. to 4:30 p.m. Monday - Friday. Please note that voicemails left after 4:00 p.m. may not be returned until the following business day.  We are closed weekends and major holidays. You have access to a nurse at all times for urgent questions. Please call the main number to the clinic [phone] and follow the prompts.  For any non-urgent questions, you may also contact your provider using MyChart. We now offer e-Visits for anyone 68 and older to request care online for non-urgent symptoms. For details visit [email or web address].   Also download the MyChart app! Go to the app store, search "MyChart", open the app, select [facility], and log in with your MyChart username and password.  Masks are optional in the cancer centers. If you would like for your care team to wear a mask while they are taking care of you, please let them know. For doctor visits, patients may have with them one support person who is at least 68 years old. At this time, visitors are not allowed in the infusion area.

## 2021-09-20 NOTE — Progress Notes (Signed)
Pharmacist Chemotherapy Monitoring - Initial Assessment    Anticipated start date: 09/20/21   The following has been reviewed per standard work regarding the patient's treatment regimen: The patient's diagnosis, treatment plan and drug doses, and organ/hematologic function Lab orders and baseline tests specific to treatment regimen  The treatment plan start date, drug sequencing, and pre-medications Prior authorization status  Patient's documented medication list, including drug-drug interaction screen and prescriptions for anti-emetics and supportive care specific to the treatment regimen The drug concentrations, fluid compatibility, administration routes, and timing of the medications to be used The patient's access for treatment and lifetime cumulative dose history, if applicable  The patient's medication allergies and previous infusion related reactions, if applicable   Changes made to treatment plan:  N/A  Follow up needed:  N/A   Dose carboplatin 470 mg (AUC) with set scr of 1 due to age.   Wynona Neat, [place], 09/20/2021  8:43 AM

## 2021-09-22 ENCOUNTER — Encounter (HOSPITAL_COMMUNITY): Payer: Self-pay

## 2021-09-22 ENCOUNTER — Inpatient Hospital Stay (HOSPITAL_COMMUNITY): Payer: Medicare Other

## 2021-09-22 VITALS — BP 150/82 | HR 99 | Temp 97.4°F | Resp 18

## 2021-09-22 DIAGNOSIS — I959 Hypotension, unspecified: Secondary | ICD-10-CM | POA: Diagnosis not present

## 2021-09-22 DIAGNOSIS — F17210 Nicotine dependence, cigarettes, uncomplicated: Secondary | ICD-10-CM | POA: Diagnosis not present

## 2021-09-22 DIAGNOSIS — R197 Diarrhea, unspecified: Secondary | ICD-10-CM | POA: Diagnosis not present

## 2021-09-22 DIAGNOSIS — G620 Drug-induced polyneuropathy: Secondary | ICD-10-CM | POA: Diagnosis not present

## 2021-09-22 DIAGNOSIS — C770 Secondary and unspecified malignant neoplasm of lymph nodes of head, face and neck: Secondary | ICD-10-CM | POA: Diagnosis not present

## 2021-09-22 DIAGNOSIS — T451X5A Adverse effect of antineoplastic and immunosuppressive drugs, initial encounter: Secondary | ICD-10-CM | POA: Diagnosis not present

## 2021-09-22 DIAGNOSIS — Z5111 Encounter for antineoplastic chemotherapy: Secondary | ICD-10-CM | POA: Diagnosis not present

## 2021-09-22 DIAGNOSIS — B370 Candidal stomatitis: Secondary | ICD-10-CM | POA: Diagnosis not present

## 2021-09-22 DIAGNOSIS — K589 Irritable bowel syndrome without diarrhea: Secondary | ICD-10-CM | POA: Diagnosis not present

## 2021-09-22 DIAGNOSIS — C3490 Malignant neoplasm of unspecified part of unspecified bronchus or lung: Secondary | ICD-10-CM | POA: Diagnosis not present

## 2021-09-22 DIAGNOSIS — Z5189 Encounter for other specified aftercare: Secondary | ICD-10-CM | POA: Diagnosis not present

## 2021-09-22 DIAGNOSIS — E860 Dehydration: Secondary | ICD-10-CM | POA: Diagnosis not present

## 2021-09-22 DIAGNOSIS — R110 Nausea: Secondary | ICD-10-CM | POA: Diagnosis not present

## 2021-09-22 DIAGNOSIS — M069 Rheumatoid arthritis, unspecified: Secondary | ICD-10-CM | POA: Diagnosis not present

## 2021-09-22 DIAGNOSIS — Z95828 Presence of other vascular implants and grafts: Secondary | ICD-10-CM

## 2021-09-22 DIAGNOSIS — Z800 Family history of malignant neoplasm of digestive organs: Secondary | ICD-10-CM | POA: Diagnosis not present

## 2021-09-22 DIAGNOSIS — C787 Secondary malignant neoplasm of liver and intrahepatic bile duct: Secondary | ICD-10-CM | POA: Diagnosis not present

## 2021-09-22 DIAGNOSIS — Z79899 Other long term (current) drug therapy: Secondary | ICD-10-CM | POA: Diagnosis not present

## 2021-09-22 MED ORDER — PEGFILGRASTIM INJECTION 6 MG/0.6ML ~~LOC~~
6.0000 mg | PREFILLED_SYRINGE | Freq: Once | SUBCUTANEOUS | Status: AC
  Administered 2021-09-22: 6 mg via SUBCUTANEOUS
  Filled 2021-09-22: qty 0.6

## 2021-09-22 NOTE — Patient Instructions (Signed)
[place] [place]  Discharge Instructions: Thank you for choosing [facility] to provide your oncology and hematology care.  If you have a lab appointment with the [facility], please come in thru the Main Entrance and check in at the main information desk.  Wear comfortable clothing and clothing appropriate for easy access to any Portacath or PICC line.   We strive to give you quality time with your provider. You may need to reschedule your appointment if you arrive late (15 or more minutes).  Arriving late affects you and other patients whose appointments are after yours.  Also, if you miss three or more appointments without notifying the office, you may be dismissed from the clinic at the provider's discretion.      For prescription refill requests, have your pharmacy contact our office and allow 72 hours for refills to be completed.    Today you received the following chemotherapy and/or immunotherapy agents Neulasta injection. Pegfilgrastim Injection What is this medication? PEGFILGRASTIM (PEG fil gra stim) lowers the risk of infection in people who are receiving chemotherapy. It works by Building control surveyor make more white blood cells, which protects your body from infection. It may also be used to help people who have been exposed to high doses of radiation. This medicine may be used for other purposes; ask your health care provider or pharmacist if you have questions. COMMON BRAND NAME(S): [employer], Neulasta, Nyvepria, Stimufend, UDENYCA, Ziextenzo What should I tell my care team before I take this medication? They need to know if you have any of these conditions: Kidney disease Latex allergy Ongoing radiation therapy Sickle cell disease Skin reactions to acrylic adhesives (On-Body Injector only) An unusual or allergic reaction to pegfilgrastim, filgrastim, other medications, foods, dyes, or preservatives Pregnant or trying to get  pregnant Breast-feeding How should I use this medication? This medication is for injection under the skin. If you get this medication at home, you will be taught how to prepare and give the pre-filled syringe or how to use the On-body Injector. Refer to the patient Instructions for Use for detailed instructions. Use exactly as directed. Tell your care team immediately if you suspect that the On-body Injector may not have performed as intended or if you suspect the use of the On-body Injector resulted in a missed or partial dose. It is important that you put your used needles and syringes in a special sharps container. Do not put them in a trash can. If you do not have a sharps container, call your pharmacist or care team to get one. Talk to your care team about the use of this medication in children. While this medication may be prescribed for selected conditions, precautions do apply. Overdosage: If you think you have taken too much of this medicine contact a poison control center or emergency room at once. NOTE: This medicine is only for you. Do not share this medicine with others. What if I miss a dose? It is important not to miss your dose. Call your care team if you miss your dose. If you miss a dose due to an On-body Injector failure or leakage, a new dose should be administered as soon as possible using a single prefilled syringe for manual use. What may interact with this medication? Interactions have not been studied. This list may not describe all possible interactions. Give your health care provider a list of all the medicines, herbs, non-prescription drugs, or dietary supplements you use. Also tell them  if you smoke, drink alcohol, or use illegal drugs. Some items may interact with your medicine. What should I watch for while using this medication? Your condition will be monitored carefully while you are receiving this medication. You may need blood work done while you are taking this  medication. Talk to your care team about your risk of cancer. You may be more at risk for certain types of cancer if you take this medication. If you are going to need a MRI, CT scan, or other procedure, tell your care team that you are using this medication (On-Body Injector only). What side effects may I notice from receiving this medication? Side effects that you should report to your care team as soon as possible: Allergic reactions--skin rash, itching, hives, swelling of the face, lips, tongue, or throat Capillary leak syndrome--stomach or muscle pain, unusual weakness or fatigue, feeling faint or lightheaded, decrease in the amount of urine, swelling of the ankles, hands, or feet, trouble breathing High white blood cell level--fever, fatigue, trouble breathing, night sweats, change in vision, weight loss Inflammation of the aorta--fever, fatigue, back, chest, or stomach pain, severe headache Kidney injury (glomerulonephritis)--decrease in the amount of urine, red or dark brown urine, foamy or bubbly urine, swelling of the ankles, hands, or feet Shortness of breath or trouble breathing Spleen injury--pain in upper left stomach or shoulder Unusual bruising or bleeding Side effects that usually do not require medical attention (report to your care team if they continue or are bothersome): Bone pain Pain in the hands or feet This list may not describe all possible side effects. Call your doctor for medical advice about side effects. You may report side effects to FDA at 1-800-FDA-1088. Where should I keep my medication? Keep out of the reach of children. If you are using this medication at home, you will be instructed on how to store it. Throw away any unused medication after the expiration date on the label. NOTE: This sheet is a summary. It may not cover all possible information. If you have questions about this medicine, talk to your doctor, pharmacist, or health care provider.  2023  Elsevier/Gold Standard (2021-01-20 00:00:00)       To help prevent nausea and vomiting after your treatment, we encourage you to take your nausea medication as directed.  BELOW ARE SYMPTOMS THAT SHOULD BE REPORTED IMMEDIATELY: *FEVER GREATER THAN 100.4 F (38 C) OR HIGHER *CHILLS OR SWEATING *NAUSEA AND VOMITING THAT IS NOT CONTROLLED WITH YOUR NAUSEA MEDICATION *UNUSUAL SHORTNESS OF BREATH *UNUSUAL BRUISING OR BLEEDING *URINARY PROBLEMS (pain or burning when urinating, or frequent urination) *BOWEL PROBLEMS (unusual diarrhea, constipation, pain near the anus) TENDERNESS IN MOUTH AND THROAT WITH OR WITHOUT PRESENCE OF ULCERS (sore throat, sores in mouth, or a toothache) UNUSUAL RASH, SWELLING OR PAIN  UNUSUAL VAGINAL DISCHARGE OR ITCHING   Items with * indicate a potential emergency and should be followed up as soon as possible or go to the Emergency Department if any problems should occur.  Please show the CHEMOTHERAPY ALERT CARD or IMMUNOTHERAPY ALERT CARD at check-in to the Emergency Department and triage nurse.  Should you have questions after your visit or need to cancel or reschedule your appointment, please contact [facility] [phone]  and follow the prompts.  Office hours are 8:00 a.m. to 4:30 p.m. Monday - Friday. Please note that voicemails left after 4:00 p.m. may not be returned until the following business day.  We are closed weekends and major holidays. You have  access to a nurse at all times for urgent questions. Please call the main number to the clinic [phone] and follow the prompts.  For any non-urgent questions, you may also contact your provider using MyChart. We now offer e-Visits for anyone 21 and older to request care online for non-urgent symptoms. For details visit [email or web address].   Also download the MyChart app! Go to the app store, search "MyChart", open the app, select [facility], and log in with your MyChart username and  password.  Masks are optional in the cancer centers. If you would like for your care team to wear a mask while they are taking care of you, please let them know. For doctor visits, patients may have with them one support person who is at least 68 years old. At this time, visitors are not allowed in the infusion area.

## 2021-09-22 NOTE — Progress Notes (Signed)
Dian Situ presents today for injection per the provider's orders.  Neulasta 6 mg Artois administration without incident; injection site WNL; see MAR for injection details.  Patient tolerated procedure well and without incident.  No questions or complaints noted at this time.  Discharged from clinic ambulatory in stable condition. Alert and oriented x 3. F/U with [facility] as scheduled.

## 2021-09-22 NOTE — Progress Notes (Signed)
24 hour call back.  Patient is feeling well, no noted nausea, vomiting or diarrhea.

## 2021-09-23 NOTE — Progress Notes (Signed)
[place], [place] [address]   CLINIC:  Medical Oncology/Hematology  PCP:  Glenda Chroman, MD [address] / [place] [address] [phone]   REASON FOR VISIT:   New start chemotherapy follow-up visit (metastatic squamous cell lung cancer to the liver)  CURRENT THERAPY: Carboplatin + paclitaxel  LAST TREATMENT DATE: 09/20/2021  BRIEF ONCOLOGIC HISTORY:  Oncology History  Squamous cell lung cancer, unspecified laterality ([place])  09/13/2021 Initial Diagnosis   Squamous cell lung cancer, unspecified laterality ([place])   09/20/2021 -  Chemotherapy   Patient is on Treatment Plan : LUNG NSCLC Carboplatin / Paclitaxel q21d x 6 cycles       CANCER STAGING: Cancer Staging  Squamous cell lung cancer, unspecified laterality ([place]) Staging form: Lung, AJCC 8th Edition - Clinical stage from 09/13/2021: Stage IVB (cTX, cN3, pM1c) - Unsigned   INTERVAL HISTORY:  [name], a 66 y.o. female, is managed by Dr. Delton Coombes for metastatic squamous cell lung cancer to the liver.  She received first cycle of treatment with carboplatin and paclitaxel on 09/20/2021.  She received Neulasta on 09/22/2021. She is seen today for toxicity check and follow-up of new start chemo.  She has history of chronic intermittent nausea, but reports that after chemotherapy her nausea has been much worse than normal.  Due to esophageal abnormality, she is physically unable to vomit.  However, she has had severe nausea accompanied by dry heaving and gagging.  She has been taking Compazine every 6 hours as needed, with some relief.  Patient reports that she had diarrhea starting on Friday, 09/22/2021 and that whenever she eats or drinks "run straight through her."  She reports 6-7 episodes of watery diarrhea each day, and has been woken up at night with diarrhea.  She has been hesitant to take antidiarrhea medication due to her underlying constipation/IBS, although she is not currently  taking her Amitiza.  She reports onset of sore throat, pain with swallowing, and mouth/tongue pain on Saturday, 09/23/2021.  She has had several episodes of oral candidiasis in the past related to her oral steroids.  She states that this feels similar.  She reports new onset tingling in her hands and feet described as "pins-and-needles," and rated as severe.  Her recent acute bronchitis is much improved, she is no longer having productive cough.  She continues to have some dyspnea on exertion.  Patient reports severe decrease in appetite, eating about 10% of her prior oral intake.  She has lost 8 pounds in the past 2 weeks.  She is drinking very little water throughout the day, but is drinking 5 Body Armor juice beverages daily.  She initially had significant fatigue after chemotherapy, but reports that her energy is starting to slowly improve, currently about 50%.  She is requiring help with tasks around the house and needs standby assist with ambulation.  She has not had any falls.  She has some lightheadedness with standing.   REVIEW OF SYSTEMS:  Review of Systems  Constitutional:  Positive for appetite change, fatigue and unexpected weight change. Negative for chills, diaphoresis and fever.  HENT:   Positive for mouth sores and sore throat. Negative for lump/mass and nosebleeds.   Eyes:  Negative for eye problems.  Respiratory:  Positive for shortness of breath. Negative for cough and hemoptysis.   Cardiovascular:  Negative for chest pain, leg swelling and palpitations.  Gastrointestinal:  Positive for diarrhea and nausea. Negative for abdominal pain,  blood in stool, constipation and vomiting.  Genitourinary:  Negative for hematuria.   Skin: Negative.   Neurological:  Positive for light-headedness and numbness. Negative for dizziness and headaches.  Hematological:  Does not bruise/bleed easily.    PAST MEDICAL/SURGICAL HISTORY:  Past Medical History:  Diagnosis Date   Abdominal aortic  stenosis    Anal fissure    Anxiety    Asthma    Complication of anesthesia    COPD (chronic obstructive pulmonary disease) ([place])    Depression    grief   Diabetes mellitus    Diverticulitis    GERD (gastroesophageal reflux disease)    Glaucoma    Gout    Hypertension    IBS (irritable bowel syndrome)    PONV (postoperative nausea and vomiting)    Port-A-Cath in place 09/19/2021   RA (rheumatoid arthritis) ([place])    Sleep apnea    had one at one time but not anymore   Stroke ([place])    "mini-stroke"- no deficits   TIA (transient ischemic attack)    2010. No deficits   Past Surgical History:  Procedure Laterality Date   ABDOMINAL HYSTERECTOMY  1982   complete   BIOPSY  11/17/2018   Procedure: BIOPSY;  Surgeon: Daneil Dolin, MD;  Location: [facility];  Service: Endoscopy;;   BREAST LUMPECTOMY     right-benign   CATARACT EXTRACTION W/PHACO Right 05/13/2017   Procedure: CATARACT EXTRACTION PHACO AND INTRAOCULAR LENS PLACEMENT ([place]);  Surgeon: Tonny Branch, MD;  Location: [facility];  Service: Ophthalmology;  Laterality: Right;  CDE: 8.59   CATARACT EXTRACTION W/PHACO Left 05/27/2017   Procedure: CATARACT EXTRACTION WITH  PHACOEMULSIFICATION AND INTRAOCULAR LENS PLACEMENT LEFT EYE;  Surgeon: Tonny Branch, MD;  Location: [facility];  Service: Ophthalmology;  Laterality: Left;  CDE: 5.66   CHOLECYSTECTOMY  2003   COLONOSCOPY  12/14/09   Dr. Gala Romney :anal papilla and hemorrhoids,diminutive hperplastic rectal polyps/normal colon   COLONOSCOPY N/A 10/29/2013   LPF:XTKW papilla and internal hemorrhoids; colonic polyps-removed as described above. I suspect benign anorectal bleeding in the setting of hemorrhoids and  possibly fissure. tubular adenoma. next TCS 10/2020.   COLONOSCOPY WITH PROPOFOL N/A 11/17/2018   Dr. Gala Romney: Nonbleeding internal hemorrhoids, hemorrhoids were moderate and grade.  Exam otherwise normal.  Colonoscopy in 5 years.   ELBOW SURGERY     right   ESOPHAGOGASTRODUODENOSCOPY   12/14/09   Dr. Gala Romney :schatzkis ring 70F, otherwise normal   ESOPHAGOGASTRODUODENOSCOPY N/A 10/29/2013   RMR: Distal esophageal pseudodiverticulum/Nissen fundoplication   ESOPHAGOGASTRODUODENOSCOPY (EGD) WITH PROPOFOL N/A 11/17/2018   Dr. Gala Romney: Normal esophagus status post dilation.  Nonbleeding erosive gastropathy with benign biopsy.   FINGER SURGERY     on right middle, pointer, and index fingers   FISSURECTOMY     several   INCISIONAL HERNIA REPAIR N/A 03/22/2014   Procedure: [name] HERNIORRHAPHY;  Surgeon: Jamesetta So, MD;  Location: [facility];  Service: General;  Laterality: N/A;   INSERTION OF MESH N/A 03/22/2014   Procedure: INSERTION OF MESH;  Surgeon: Jamesetta So, MD;  Location: [facility];  Service: General;  Laterality: N/A;   MALONEY DILATION N/A 11/17/2018   Procedure: [name] DILATION;  Surgeon: Daneil Dolin, MD;  Location: [facility];  Service: Endoscopy;  Laterality: N/A;   NISSEN FUNDOPLICATION  [id number]   [place]   PORTACATH PLACEMENT Left 07/12/2021   Procedure: INSERTION PORT-A-CATH;  Surgeon: Virl Cagey, MD;  Location: [facility];  Service: General;  Laterality:  Left;   WRIST SURGERY     left    SOCIAL HISTORY:  Social History   Socioeconomic History   Marital status: Widowed    Spouse name: Not on file   Number of children: Not on file   Years of education: Not on file   Highest education level: Not on file  Occupational History   Occupation: disabled  Tobacco Use   Smoking status: Former    Packs/day: 0.25    Years: 19.00    Total pack years: 4.75    Types: Cigarettes    Quit date: 01/09/2007    Years since quitting: 14.7   Smokeless tobacco: Never  Vaping Use   Vaping Use: Never used  Substance and Sexual Activity   Alcohol use: No   Drug use: No   Sexual activity: Never  Other Topics Concern   Not on file  Social History Narrative   1 son-healthy   1 daughter-MVA (drunk-driver)   Social Determinants of Health   Financial  Resource Strain: Not on file  Food Insecurity: Not on file  Transportation Needs: Not on file  Physical Activity: Not on file  Stress: Not on file  Social Connections: Not on file  Intimate Partner Violence: Not on file    FAMILY HISTORY: Family history has been reviewed by me and is documented elsewhere in the electronic medical record.  CURRENT MEDICATIONS: Current medications have been reviewed by me and are documented elsewhere in electronic medical record.  ALLERGIES: Drug allergies have been reviewed by me and are documented elsewhere in the electronic medical record.  PHYSICAL EXAM:  Performance status (ECOG): 3 - Symptomatic, >50% confined to bed There were no vitals filed for this visit. Wt Readings from Last 3 Encounters:  09/13/21 176 lb 5.9 oz (80 kg)  08/14/21 182 lb (82.6 kg)  07/20/21 174 lb 4.8 oz (79.1 kg)   Physical Exam Constitutional:      Appearance: Normal appearance. She is obese.  HENT:     Head: Normocephalic and atraumatic.     Mouth/Throat:     Mouth: Mucous membranes are moist.     Comments: Whitish plaque coating tongue, with cracks noted in surface Eyes:     Extraocular Movements: Extraocular movements intact.     Pupils: Pupils are equal, round, and reactive to light.  Cardiovascular:     Rate and Rhythm: Normal rate and regular rhythm.     Pulses: Normal pulses.     Heart sounds: Normal heart sounds.  Pulmonary:     Effort: Pulmonary effort is normal.     Breath sounds: Normal breath sounds.  Abdominal:     General: Bowel sounds are normal.     Palpations: Abdomen is soft.     Tenderness: There is no abdominal tenderness.  Musculoskeletal:        General: No swelling.     Right lower leg: No edema.     Left lower leg: No edema.  Lymphadenopathy:     Cervical: No cervical adenopathy.  Skin:    General: Skin is warm and dry.  Neurological:     General: No focal deficit present.     Mental Status: She is alert and oriented to person,  place, and time.  Psychiatric:        Mood and Affect: Mood normal.        Behavior: Behavior normal.      LABORATORY DATA: Relevant labs have been reviewed by me and are documented elsewhere  in the electronic medical record.  DIAGNOSTIC IMAGING: Relevant imaging has been reviewed by me and are documented elsewhere in the electronic medical record.  ASSESSMENT & PLAN: 1.  Metastatic squamous cell lung cancer to the liver - Primary oncologist is Dr. Delton Coombes, who has discussed with patient that chemotherapy is with palliative intent - Received first cycle of treatment with carboplatin and paclitaxel on 09/20/2021 - CBC (09/25/2021): Stable Hgb 11.0, ANC 8.9, ALC 0.6 - CMP (09/25/2021): Normal creatinine 0.66, normal potassium 4.1, normal magnesium 1.8. - PLAN: Patient is scheduled for labs, MD visit with Dr. Delton Coombes, and next cycle of treatment on 10/11/2021  2.  Acute on chronic nausea - She has history of chronic intermittent nausea, but reports that after chemotherapy her nausea has been much worse than normal.  Due to esophageal abnormality, she is physically unable to vomit.  However, she has had severe nausea accompanied by dry heaving and gagging.  She has been taking Compazine every 6 hours as needed, with some relief. - PLAN: Instructed to take Compazine scheduled every 6 hours with Zofran as needed for breakthrough nausea.  3.  Diarrhea - Diarrhea starting on Friday, 09/22/2021 and that whenever she eats or drinks "run straight through her."  She reports 6-7 episodes of watery diarrhea each day, and has been woken up at night with diarrhea.  She has been hesitant to take antidiarrhea medication due to her underlying constipation/IBS, although she is not currently taking her Amitiza. - PLAN: Prescription sent for Imodium.  Patient instructed to call clinic if Imodium does not relieve diarrhea, and we will consider prescription of Lomotil if needed.  She is anxious to avoid any rebound  constipation due to her history of IBS-C  4.  Dehydration - Secondary to poor oral intake and diarrhea - She is drinking very little water throughout the day, but is drinking 5 Body Armor juice beverages daily.  - Creatinine at baseline, no electrolyte disturbances, but vitals are marginal with BP 99/64 heart rate 101.   - PLAN: Encouraged increased water intake at home. - We will give IV fluids with NS x1 L  5.  Oral candidiasis - She has had thrush on several occasions in the past, as she is on chronic oral steroids for her RA - Onset of sore throat, pain with swallowing, and mouth/tongue pain on Saturday, 09/23/2021. -Whitish tongue plaque noted on exam - PLAN: Prescription for nystatin swish/swallow sent to pharmacy x7 days.  6.  Chemo-induced peripheral neuropathy -  New onset tingling in her hands and feet described as "pins-and-needles," and rated as severe. - PLAN: Prescription for gabapentin, 100 mg 1 to 2 capsules 3 times daily  7.  Borderline hypotension - Blood pressure 99/64, likely secondary to dehydration -- She has not had any falls.  She has some lightheadedness with standing. - She takes amlodipine and metoprolol at home - PLAN: Instructed to HOLD amlodipine.  Can continue metoprolol, since she also takes this for tachycardia.  8.  Nutrition & weight - Severe decrease in appetite, eating about 10% of her prior oral intake. - She has lost 8 pounds in the past 2 weeks. - She is drinking very little water throughout the day, but is drinking 5 Body Armor juice beverages daily.  - PLAN: Referral placed to dietitian, who recommended Ensure Plus daily x2 - Encourage small frequent meals  All questions were answered. The patient knows to call the clinic with any problems, questions or concerns.  Medical decision making:  Moderate  Time spent on visit: I spent 25 minutes counseling the patient face to face. The total time spent in the appointment was 40 minutes and more  than 50% was on counseling.   Harriett Rush, PA-C  09/26/2021 11:16 PM

## 2021-09-25 ENCOUNTER — Ambulatory Visit (HOSPITAL_COMMUNITY): Payer: Medicare Other | Admitting: Dietician

## 2021-09-25 ENCOUNTER — Inpatient Hospital Stay (HOSPITAL_BASED_OUTPATIENT_CLINIC_OR_DEPARTMENT_OTHER): Payer: Medicare Other | Admitting: Physician Assistant

## 2021-09-25 ENCOUNTER — Inpatient Hospital Stay (HOSPITAL_COMMUNITY): Payer: Medicare Other

## 2021-09-25 ENCOUNTER — Other Ambulatory Visit: Payer: Self-pay

## 2021-09-25 VITALS — BP 125/76 | HR 88

## 2021-09-25 DIAGNOSIS — K589 Irritable bowel syndrome without diarrhea: Secondary | ICD-10-CM | POA: Diagnosis not present

## 2021-09-25 DIAGNOSIS — G620 Drug-induced polyneuropathy: Secondary | ICD-10-CM

## 2021-09-25 DIAGNOSIS — Z09 Encounter for follow-up examination after completed treatment for conditions other than malignant neoplasm: Secondary | ICD-10-CM

## 2021-09-25 DIAGNOSIS — D649 Anemia, unspecified: Secondary | ICD-10-CM

## 2021-09-25 DIAGNOSIS — C3490 Malignant neoplasm of unspecified part of unspecified bronchus or lung: Secondary | ICD-10-CM

## 2021-09-25 DIAGNOSIS — R110 Nausea: Secondary | ICD-10-CM | POA: Diagnosis not present

## 2021-09-25 DIAGNOSIS — Z5111 Encounter for antineoplastic chemotherapy: Secondary | ICD-10-CM | POA: Diagnosis not present

## 2021-09-25 DIAGNOSIS — T451X5A Adverse effect of antineoplastic and immunosuppressive drugs, initial encounter: Secondary | ICD-10-CM | POA: Diagnosis not present

## 2021-09-25 DIAGNOSIS — Z95828 Presence of other vascular implants and grafts: Secondary | ICD-10-CM

## 2021-09-25 DIAGNOSIS — Z800 Family history of malignant neoplasm of digestive organs: Secondary | ICD-10-CM | POA: Diagnosis not present

## 2021-09-25 DIAGNOSIS — Z79899 Other long term (current) drug therapy: Secondary | ICD-10-CM | POA: Diagnosis not present

## 2021-09-25 DIAGNOSIS — I959 Hypotension, unspecified: Secondary | ICD-10-CM | POA: Diagnosis not present

## 2021-09-25 DIAGNOSIS — B370 Candidal stomatitis: Secondary | ICD-10-CM

## 2021-09-25 DIAGNOSIS — R112 Nausea with vomiting, unspecified: Secondary | ICD-10-CM | POA: Diagnosis not present

## 2021-09-25 DIAGNOSIS — K521 Toxic gastroenteritis and colitis: Secondary | ICD-10-CM | POA: Diagnosis not present

## 2021-09-25 DIAGNOSIS — Z5189 Encounter for other specified aftercare: Secondary | ICD-10-CM | POA: Diagnosis not present

## 2021-09-25 DIAGNOSIS — C787 Secondary malignant neoplasm of liver and intrahepatic bile duct: Secondary | ICD-10-CM | POA: Diagnosis not present

## 2021-09-25 DIAGNOSIS — C770 Secondary and unspecified malignant neoplasm of lymph nodes of head, face and neck: Secondary | ICD-10-CM | POA: Diagnosis not present

## 2021-09-25 DIAGNOSIS — F17210 Nicotine dependence, cigarettes, uncomplicated: Secondary | ICD-10-CM | POA: Diagnosis not present

## 2021-09-25 DIAGNOSIS — R197 Diarrhea, unspecified: Secondary | ICD-10-CM | POA: Diagnosis not present

## 2021-09-25 DIAGNOSIS — E860 Dehydration: Secondary | ICD-10-CM | POA: Diagnosis not present

## 2021-09-25 DIAGNOSIS — M069 Rheumatoid arthritis, unspecified: Secondary | ICD-10-CM | POA: Diagnosis not present

## 2021-09-25 LAB — CBC WITH DIFFERENTIAL/PLATELET
Abs Immature Granulocytes: 0.2 10*3/uL — ABNORMAL HIGH (ref 0.00–0.07)
Basophils Absolute: 0 10*3/uL (ref 0.0–0.1)
Basophils Relative: 0 %
Eosinophils Absolute: 0 10*3/uL (ref 0.0–0.5)
Eosinophils Relative: 0 %
HCT: 35.7 % — ABNORMAL LOW (ref 36.0–46.0)
Hemoglobin: 11 g/dL — ABNORMAL LOW (ref 12.0–15.0)
Lymphocytes Relative: 6 %
Lymphs Abs: 0.6 10*3/uL — ABNORMAL LOW (ref 0.7–4.0)
MCH: 26.9 pg (ref 26.0–34.0)
MCHC: 30.8 g/dL (ref 30.0–36.0)
MCV: 87.3 fL (ref 80.0–100.0)
Metamyelocytes Relative: 2 %
Monocytes Absolute: 0.2 10*3/uL (ref 0.1–1.0)
Monocytes Relative: 2 %
Neutro Abs: 8.9 10*3/uL — ABNORMAL HIGH (ref 1.7–7.7)
Neutrophils Relative %: 90 %
Platelets: 252 10*3/uL (ref 150–400)
RBC: 4.09 MIL/uL (ref 3.87–5.11)
RDW: 15.3 % (ref 11.5–15.5)
WBC: 9.9 10*3/uL (ref 4.0–10.5)
nRBC: 0 % (ref 0.0–0.2)

## 2021-09-25 LAB — COMPREHENSIVE METABOLIC PANEL
ALT: 25 U/L (ref 0–44)
AST: 23 U/L (ref 15–41)
Albumin: 2.6 g/dL — ABNORMAL LOW (ref 3.5–5.0)
Alkaline Phosphatase: 93 U/L (ref 38–126)
Anion gap: 8 (ref 5–15)
BUN: 8 mg/dL (ref 8–23)
CO2: 24 mmol/L (ref 22–32)
Calcium: 8.4 mg/dL — ABNORMAL LOW (ref 8.9–10.3)
Chloride: 105 mmol/L (ref 98–111)
Creatinine, Ser: 0.66 mg/dL (ref 0.44–1.00)
GFR, Estimated: >60 mL/min (ref >60)
Glucose, Bld: 139 mg/dL — ABNORMAL HIGH (ref 70–99)
Potassium: 4.1 mmol/L (ref 3.5–5.1)
Sodium: 137 mmol/L (ref 135–145)
Total Bilirubin: 0.8 mg/dL (ref 0.3–1.2)
Total Protein: 7.2 g/dL (ref 6.5–8.1)

## 2021-09-25 LAB — IRON AND TIBC
Iron: 124 ug/dL (ref 28–170)
Saturation Ratios: 44 % — ABNORMAL HIGH (ref 10.4–31.8)
TIBC: 279 ug/dL (ref 250–450)
UIBC: 155 ug/dL

## 2021-09-25 LAB — FERRITIN: Ferritin: 963 ng/mL — ABNORMAL HIGH (ref 11–307)

## 2021-09-25 LAB — VITAMIN B12: Vitamin B-12: 565 pg/mL (ref 180–914)

## 2021-09-25 LAB — MAGNESIUM: Magnesium: 1.8 mg/dL (ref 1.7–2.4)

## 2021-09-25 LAB — FOLATE: Folate: 24.8 ng/mL (ref >5.9)

## 2021-09-25 MED ORDER — NYSTATIN 100000 UNIT/ML MT SUSP: 5.0000 mL | Freq: Four times a day (QID) | OROMUCOSAL | 0 refills | Status: DC

## 2021-09-25 MED ORDER — LOPERAMIDE HCL 2 MG PO CAPS: 2.0000 mg | ORAL_CAPSULE | ORAL | 0 refills | Status: DC | PRN

## 2021-09-25 MED ORDER — DEXAMETHASONE 4 MG PO TABS: ORAL_TABLET | ORAL | 0 refills | Status: DC

## 2021-09-25 MED ORDER — SODIUM CHLORIDE 0.9 % IV SOLN: INTRAVENOUS | Status: AC

## 2021-09-25 MED ORDER — HEPARIN SOD (PORK) LOCK FLUSH 100 UNIT/ML IV SOLN
500.0000 [IU] | Freq: Once | INTRAVENOUS | Status: AC
  Administered 2021-09-25: 500 [IU] via INTRAVENOUS

## 2021-09-25 MED ORDER — SODIUM CHLORIDE 0.9% FLUSH
10.0000 mL | Freq: Once | INTRAVENOUS | Status: AC
  Administered 2021-09-25: 10 mL via INTRAVENOUS

## 2021-09-25 MED ORDER — ONDANSETRON HCL 8 MG PO TABS: 8.0000 mg | ORAL_TABLET | Freq: Three times a day (TID) | ORAL | 0 refills | Status: DC | PRN

## 2021-09-25 MED ORDER — GABAPENTIN 300 MG PO CAPS: 300.0000 mg | ORAL_CAPSULE | Freq: Three times a day (TID) | ORAL | 0 refills | Status: DC

## 2021-09-25 NOTE — Progress Notes (Signed)
Nutrition Assessment   Reason for Assessment: provider referral    ASSESSMENT: 66 year old female with NSCLC metastatic to liver. She is currently receiving carboplatin + paclitaxel q21d (started 09/20/21). Patient is under the care of Dr. Delton Coombes.   Past medical history includes COPD, OSA on CPAP, IBS, DM2, RA, GERD, HTN, TIA, gout, anxiety, diverticulitis, glaucoma.   Met with patient and daughter in clinic. Patient reports nausea, poor appetite, and fatigue ongoing since start of therapy. Patient is compazine for nausea which is working well for her. Daughter reports pt has not been eating much. Recalls bite of hotdog for lunch. Pt had mashed potatoes from [facility] for supper. She took one bite of chicken and did not want anymore. This is one of her favorite foods per daughter. Patient is drinking ~4 body armor and tea. She is going to start drinking water. Patient has not previously tried Boost/Ensure.   Nutrition Focused Physical Exam: deferred  Medications: glipizide, D3, plavix, nexium, compazine, tramadol, ambien, prednisone  Labs: glucose 139   Anthropometrics:   Height: 5'1" Weight: 168 lb 11.2 oz  UBW: 176 lb  BMI: 31.88   NUTRITION DIAGNOSIS: Unintentional weight loss related to cancer and associated treatment side effects as evidenced by reported nausea, decreased appetite, 4.5% (8 lb) wt loss in 2 weeks; significant    INTERVENTION:  Educated on strategies for nausea, foods to avoid/limit and foods best tolerated - handout with tips provided Encouraged small frequent meals/snacks with adequate calories and protein - handout with ideas provided Educated on ways to add calories/protein to foods (adding cheese, using milk vs water in soups/oatmeal)  Encouraged high protein bedtime snack  Suggested oral nutrition supplement, recommend 2 Ensure Plus/equivalent daily - provided samples of Ensure Complete (choc/vanilla/strawberry) for pt to try  Contact information provided      MONITORING, EVALUATION, GOAL: Patient will tolerate increased calories and protein to minimize further weight loss    Next Visit: To be scheduled as needed

## 2021-09-25 NOTE — Patient Instructions (Addendum)
[place] at [facility] Discharge Instructions  You were seen today by Tarri Abernethy PA-C for your chemotherapy follow-up visit.    NAUSEA: It is much easier to prevent nausea then to treat nausea that has already happened. Take Compazine 10 mg every 6 hours whether you feel that you need it or not. Take Zofran (ondansetron) 8 mg AS NEEDED every 8 hours for any breakthrough nausea. For your next cycle of chemotherapy, we will prescribe DEXAMETHASONE steroid for 1 week after your chemotherapy.  This will help prevent nausea.  You will receive IV dexamethasone with your chemotherapy treatment (day 1).  You will take dexamethasone 8 mg tablets for the next 6 days (day 2 through day 7).  While you are taking dexamethasone, you should not take your prednisone.  You can take your prednisone once you are not taking your dexamethasone anymore.  DIARRHEA: Take Imodium 2 tablets after your first loose bowel movement of the day.  Take an additional tablet of Imodium after every subsequent loose bowel movement.  Do not take more than 8 tablets (60 mg total) of Imodium in 1 day.  If you continue to have severe diarrhea, please call the clinic so that we can prescribe another medicine.  DEHYDRATION: Make sure that you are drinking at least 64 ounces of water each day.  You can drink juice and other beverages in addition to water, but should still drink plenty of water.  THRUSH: Prescription for nystatin liquid sent to the pharmacy.  You will need to swish and swallow this 4 times a day for the next week.  If you are still having mouth pain and signs of thrush, call the clinic so that we can prescribe another medication.  NEUROPATHY: The "pins-and-needles numbness and tingling pain" in your hands and feet is a common side effect of your chemotherapy.  We will start you on a medication called gabapentin.   Start by taking gabapentin 100 mg 3 times each day.  If that is not helping, you  can increase to 200 mg 3 times each day. This medication can cause drowsiness and dizziness.    LOW BLOOD PRESSURE: This is likely because you are so dehydrated. For the time being, you will need to STOP taking your amlodipine. You can continue to take your metoprolol.  NUTRITION & WEIGHT LOSS: Follow recommendations as given by dietitian today. Try to eat small frequent meals throughout the day. Drink Boost or Ensure, or similar nutritional beverage, as recommended by dietitian.  FOLLOW-UP APPOINTMENT: Follow-up appointment for labs, chemotherapy, and visit with Dr. Delton Coombes on Wednesday, 10/11/2021.  **[place] [id number]) if...  You have any worsening nausea, vomiting, diarrhea, mouth sores, or pain before your next visit.  You are unable to eat and drink adequately, please call the clinic so that we can bring you in for additional IV fluids. You have any other questions or concerns.   - - - - - - - - - - - - - - - - - -   Thank you for choosing [place] at [facility] to provide your oncology and hematology care.  To afford each patient quality time with our provider, please arrive at least 15 minutes before your scheduled appointment time.   If you have a lab appointment with the [facility] please come in thru the Main Entrance and check in at the main information desk.  You need to re-schedule your appointment should you  arrive 10 or more minutes late.  We strive to give you quality time with our providers, and arriving late affects you and other patients whose appointments are after yours.  Also, if you no show three or more times for appointments you may be dismissed from the clinic at the providers discretion.     Again, thank you for choosing [facility].  Our hope is that these requests will decrease the amount of time that you wait before being seen by our physicians.        _____________________________________________________________  Should you have questions after your visit to [facility], please contact our office at [phone] and follow the prompts.  Our office hours are 8:00 a.m. and 4:30 p.m. Monday - Friday.  Please note that voicemails left after 4:00 p.m. may not be returned until the following business day.  We are closed weekends and major holidays.  You do have access to a nurse 24-7, just call the main number to the clinic [phone] and do not press any options, hold on the line and a nurse will answer the phone.    For prescription refill requests, have your pharmacy contact our office and allow 72 hours.    Due to Covid, you will need to wear a mask upon entering the hospital. If you do not have a mask, a mask will be given to you at the Main Entrance upon arrival. For doctor visits, patients may have 1 support person age 73 or older with them. For treatment visits, patients can not have anyone with them due to social distancing guidelines and our immunocompromised population.

## 2021-09-26 ENCOUNTER — Encounter (HOSPITAL_COMMUNITY): Payer: Self-pay | Admitting: Hematology

## 2021-10-02 DIAGNOSIS — E119 Type 2 diabetes mellitus without complications: Secondary | ICD-10-CM | POA: Diagnosis not present

## 2021-10-11 ENCOUNTER — Inpatient Hospital Stay: Payer: Medicare Other

## 2021-10-11 ENCOUNTER — Other Ambulatory Visit: Payer: Self-pay | Admitting: *Deleted

## 2021-10-11 ENCOUNTER — Inpatient Hospital Stay: Payer: Medicare Other | Attending: Hematology

## 2021-10-11 ENCOUNTER — Inpatient Hospital Stay (HOSPITAL_BASED_OUTPATIENT_CLINIC_OR_DEPARTMENT_OTHER): Payer: Medicare Other | Admitting: Hematology

## 2021-10-11 VITALS — BP 139/86 | HR 88 | Temp 98.6°F | Resp 20

## 2021-10-11 DIAGNOSIS — C3490 Malignant neoplasm of unspecified part of unspecified bronchus or lung: Secondary | ICD-10-CM

## 2021-10-11 DIAGNOSIS — Z79899 Other long term (current) drug therapy: Secondary | ICD-10-CM | POA: Diagnosis not present

## 2021-10-11 DIAGNOSIS — C787 Secondary malignant neoplasm of liver and intrahepatic bile duct: Secondary | ICD-10-CM | POA: Diagnosis not present

## 2021-10-11 DIAGNOSIS — Z5111 Encounter for antineoplastic chemotherapy: Secondary | ICD-10-CM | POA: Diagnosis not present

## 2021-10-11 DIAGNOSIS — Z5189 Encounter for other specified aftercare: Secondary | ICD-10-CM | POA: Insufficient documentation

## 2021-10-11 DIAGNOSIS — Z95828 Presence of other vascular implants and grafts: Secondary | ICD-10-CM

## 2021-10-11 LAB — COMPREHENSIVE METABOLIC PANEL
ALT: 28 U/L (ref 0–44)
AST: 19 U/L (ref 15–41)
Albumin: 2.2 g/dL — ABNORMAL LOW (ref 3.5–5.0)
Alkaline Phosphatase: 126 U/L (ref 38–126)
Anion gap: 7 (ref 5–15)
BUN: 8 mg/dL (ref 8–23)
CO2: 23 mmol/L (ref 22–32)
Calcium: 8.7 mg/dL — ABNORMAL LOW (ref 8.9–10.3)
Chloride: 109 mmol/L (ref 98–111)
Creatinine, Ser: 0.61 mg/dL (ref 0.44–1.00)
GFR, Estimated: >60 mL/min (ref >60)
Glucose, Bld: 98 mg/dL (ref 70–99)
Potassium: 3.6 mmol/L (ref 3.5–5.1)
Sodium: 139 mmol/L (ref 135–145)
Total Bilirubin: 0.2 mg/dL — ABNORMAL LOW (ref 0.3–1.2)
Total Protein: 7.1 g/dL (ref 6.5–8.1)

## 2021-10-11 LAB — CBC WITH DIFFERENTIAL/PLATELET
Abs Immature Granulocytes: 0.03 10*3/uL (ref 0.00–0.07)
Basophils Absolute: 0.1 10*3/uL (ref 0.0–0.1)
Basophils Relative: 2 %
Eosinophils Absolute: 0.4 10*3/uL (ref 0.0–0.5)
Eosinophils Relative: 4 %
HCT: 30.9 % — ABNORMAL LOW (ref 36.0–46.0)
Hemoglobin: 9.4 g/dL — ABNORMAL LOW (ref 12.0–15.0)
Immature Granulocytes: 0 %
Lymphocytes Relative: 18 %
Lymphs Abs: 1.7 10*3/uL (ref 0.7–4.0)
MCH: 26.2 pg (ref 26.0–34.0)
MCHC: 30.4 g/dL (ref 30.0–36.0)
MCV: 86.1 fL (ref 80.0–100.0)
Monocytes Absolute: 1.2 10*3/uL — ABNORMAL HIGH (ref 0.1–1.0)
Monocytes Relative: 12 %
Neutro Abs: 6.1 10*3/uL (ref 1.7–7.7)
Neutrophils Relative %: 64 %
Platelets: 488 10*3/uL — ABNORMAL HIGH (ref 150–400)
RBC: 3.59 MIL/uL — ABNORMAL LOW (ref 3.87–5.11)
RDW: 17.9 % — ABNORMAL HIGH (ref 11.5–15.5)
WBC: 9.5 10*3/uL (ref 4.0–10.5)
nRBC: 0 % (ref 0.0–0.2)

## 2021-10-11 LAB — MAGNESIUM: Magnesium: 1.8 mg/dL (ref 1.7–2.4)

## 2021-10-11 MED ORDER — SODIUM CHLORIDE 0.9 % IV SOLN: Freq: Once | INTRAVENOUS | Status: AC

## 2021-10-11 MED ORDER — FAMOTIDINE IN NACL 20-0.9 MG/50ML-% IV SOLN
INTRAVENOUS | Status: AC
  Filled 2021-10-11: qty 50

## 2021-10-11 MED ORDER — BENZONATATE 200 MG PO CAPS: 400.0000 mg | ORAL_CAPSULE | Freq: Three times a day (TID) | ORAL | 2 refills | Status: DC | PRN

## 2021-10-11 MED ORDER — SODIUM CHLORIDE 0.9 % IV SOLN
10.0000 mg | Freq: Once | INTRAVENOUS | Status: AC
  Administered 2021-10-11: 10 mg via INTRAVENOUS
  Filled 2021-10-11: qty 10

## 2021-10-11 MED ORDER — PALONOSETRON HCL INJECTION 0.25 MG/5ML
INTRAVENOUS | Status: AC
  Filled 2021-10-11: qty 5

## 2021-10-11 MED ORDER — SODIUM CHLORIDE 0.9 % IV SOLN
150.0000 mg | Freq: Once | INTRAVENOUS | Status: AC
  Administered 2021-10-11: 150 mg via INTRAVENOUS
  Filled 2021-10-11: qty 5

## 2021-10-11 MED ORDER — SODIUM CHLORIDE 0.9 % IV SOLN
131.2500 mg/m2 | Freq: Once | INTRAVENOUS | Status: AC
  Administered 2021-10-11: 246 mg via INTRAVENOUS
  Filled 2021-10-11: qty 41

## 2021-10-11 MED ORDER — DIPHENHYDRAMINE HCL 50 MG/ML IJ SOLN
INTRAMUSCULAR | Status: AC
  Filled 2021-10-11: qty 1

## 2021-10-11 MED ORDER — BENZONATATE 200 MG PO CAPS: 200.0000 mg | ORAL_CAPSULE | Freq: Three times a day (TID) | ORAL | 2 refills | Status: DC | PRN

## 2021-10-11 MED ORDER — SODIUM CHLORIDE 0.9 % IV SOLN
470.0000 mg | Freq: Once | INTRAVENOUS | Status: AC
  Administered 2021-10-11: 470 mg via INTRAVENOUS
  Filled 2021-10-11: qty 47

## 2021-10-11 MED ORDER — DIPHENHYDRAMINE HCL 50 MG/ML IJ SOLN
50.0000 mg | Freq: Once | INTRAMUSCULAR | Status: AC
  Administered 2021-10-11: 50 mg via INTRAVENOUS

## 2021-10-11 MED ORDER — SODIUM CHLORIDE 0.9% FLUSH
10.0000 mL | INTRAVENOUS | Status: DC | PRN
  Administered 2021-10-11: 10 mL

## 2021-10-11 MED ORDER — HEPARIN SOD (PORK) LOCK FLUSH 100 UNIT/ML IV SOLN
500.0000 [IU] | Freq: Once | INTRAVENOUS | Status: AC | PRN
  Administered 2021-10-11: 500 [IU]

## 2021-10-11 MED ORDER — HYDROXYZINE HCL 25 MG PO TABS: 25.0000 mg | ORAL_TABLET | Freq: Four times a day (QID) | ORAL | 1 refills | Status: DC | PRN

## 2021-10-11 MED ORDER — FAMOTIDINE IN NACL 20-0.9 MG/50ML-% IV SOLN
20.0000 mg | Freq: Once | INTRAVENOUS | Status: AC
  Administered 2021-10-11: 20 mg via INTRAVENOUS

## 2021-10-11 MED ORDER — PALONOSETRON HCL INJECTION 0.25 MG/5ML
0.2500 mg | Freq: Once | INTRAVENOUS | Status: AC
  Administered 2021-10-11: 0.25 mg via INTRAVENOUS

## 2021-10-11 NOTE — Patient Instructions (Addendum)
[place] at [facility] Discharge Instructions  You were seen and examined today by Dr. Delton Coombes.  Dr. Delton Coombes discussed your most recent lab work today, which is stable. Continue with treatment today. Dr. Delton Coombes is going to give you a decreased dose of the Taxol today because of the side effects you are experiencing. Dr. Delton Coombes has prescribed Atarax 25mg  to be taken once every 6 hrs as needed for itching.  You will have a CT scan following your 3rd cycle of treatment.  Follow-up as scheduled.  Thank you for choosing [place] at [facility] to provide your oncology and hematology care.  To afford each patient quality time with our provider, please arrive at least 15 minutes before your scheduled appointment time.   If you have a lab appointment with the [facility] please come in thru the Main Entrance and check in at the main information desk.  You need to re-schedule your appointment should you arrive 10 or more minutes late.  We strive to give you quality time with our providers, and arriving late affects you and other patients whose appointments are after yours.  Also, if you no show three or more times for appointments you may be dismissed from the clinic at the providers discretion.     Again, thank you for choosing [facility].  Our hope is that these requests will decrease the amount of time that you wait before being seen by our physicians.       _____________________________________________________________  Should you have questions after your visit to [facility], please contact our office at [phone] and follow the prompts.  Our office hours are 8:00 a.m. and 4:30 p.m. Monday - Friday.  Please note that voicemails left after 4:00 p.m. may not be returned until the following business day.  We are closed weekends and major holidays.  You do have access to a nurse 24-7, just call the main  number to the clinic [phone] and do not press any options, hold on the line and a nurse will answer the phone.    For prescription refill requests, have your pharmacy contact our office and allow 72 hours.

## 2021-10-11 NOTE — Progress Notes (Signed)
Patients port flushed without difficulty.  Good blood return noted with no bruising or swelling noted at site.  Stable during access and blood draw.  Patient to remain accessed for treatment. 

## 2021-10-11 NOTE — Progress Notes (Signed)
Pt presents today Taxol and Carboplatin per provider's order. Vital signs and labs WNL for treatment. Taxol will be reduce due to itching and fatigue per Dr.K. Faythe Ghee to proceed with treatment today per Dr.K.  Taxol and Carboplatin given today per MD orders. Tolerated infusion without adverse affects. Vital signs stable. No complaints at this time. Discharged from clinic ambulatory in stable condition. Alert and oriented x 3. F/U with [facility] as scheduled.

## 2021-10-11 NOTE — Progress Notes (Signed)
Brittney Martinez, Brittney Martinez   CLINIC:  Medical Oncology/Hematology  PCP:  Brittney Chroman, MD [address] / [place] [address] [phone]   REASON FOR VISIT:  Follow-up for metastatic squamous cell lung cancer to the liver  PRIOR THERAPY: none  NGS Results: PD-L1 negative, no targetable mutations.  CURRENT THERAPY: Carboplatin and paclitaxel  BRIEF ONCOLOGIC HISTORY:  Oncology History  Squamous cell lung cancer, unspecified laterality ([place])  09/13/2021 Initial Diagnosis   Squamous cell lung cancer, unspecified laterality ([place])   09/20/2021 -  Chemotherapy   Patient is on Treatment Plan : LUNG NSCLC Carboplatin / Paclitaxel q21d x 6 cycles       CANCER STAGING:  Cancer Staging  Squamous cell lung cancer, unspecified laterality ([place]) Staging form: Lung, AJCC 8th Edition - Clinical stage from 09/13/2021: Stage IVB (cTX, cN3, pM1c) - Unsigned  INTERVAL HISTORY:  Brittney Martinez, a 66 y.o. female, here for follow-up, toxicity assessment and cycle 2 of chemotherapy.  She received cycle 1 of chemotherapy on 09/20/2021 with carboplatin and paclitaxel.  She reports her legs which are hurting since Sunday.  Tingling in the feet and hands was reported.  She reports improvement in her rheumatoid arthritis pains in the hands.  After day 3 of chemotherapy, she had itching all over which lasted about 3 days.  She had some nausea but denied any vomiting.  Compazine helped.  She had fatigue for couple of weeks.  REVIEW OF SYSTEMS:  Review of Systems  Constitutional:  Positive for fatigue. Negative for appetite change.  Respiratory:  Positive for cough and shortness of breath.   Gastrointestinal:  Negative for nausea (improved).  All other systems reviewed and are negative.   PAST MEDICAL/SURGICAL HISTORY:  Past Medical History:  Diagnosis Date   Abdominal aortic stenosis    Anal fissure    Anxiety    Asthma    Complication of anesthesia     COPD (chronic obstructive pulmonary disease) ([place])    Depression    grief   Diabetes mellitus    Diverticulitis    GERD (gastroesophageal reflux disease)    Glaucoma    Gout    Hypertension    IBS (irritable bowel syndrome)    PONV (postoperative nausea and vomiting)    Port-A-Cath in place 09/19/2021   RA (rheumatoid arthritis) ([place])    Sleep apnea    had one at one time but not anymore   Stroke ([place])    "mini-stroke"- no deficits   TIA (transient ischemic attack)    2010. No deficits   Past Surgical History:  Procedure Laterality Date   ABDOMINAL HYSTERECTOMY  1982   complete   BIOPSY  11/17/2018   Procedure: BIOPSY;  Surgeon: Daneil Dolin, MD;  [facility];  Service: Endoscopy;;   BREAST LUMPECTOMY     right-benign   CATARACT EXTRACTION W/PHACO Right 05/13/2017   Procedure: CATARACT EXTRACTION PHACO AND INTRAOCULAR LENS PLACEMENT ([address]);  Surgeon: Tonny Branch, MD;  Location: [facility];  Service: Ophthalmology;  Laterality: Right;  CDE: 8.59   CATARACT EXTRACTION W/PHACO Left 05/27/2017   Procedure: CATARACT EXTRACTION WITH  PHACOEMULSIFICATION AND INTRAOCULAR LENS PLACEMENT LEFT EYE;  Surgeon: Tonny Branch, MD;  Location: [facility];  Service: Ophthalmology;  Laterality: Left;  CDE: 5.66   CHOLECYSTECTOMY  2003   COLONOSCOPY  12/14/09   Dr. Gala Romney :anal papilla and hemorrhoids,diminutive hperplastic rectal polyps/normal colon   COLONOSCOPY N/A  10/29/2013   EYC:XKGY papilla and internal hemorrhoids; colonic polyps-removed as described above. I suspect benign anorectal bleeding in the setting of hemorrhoids and  possibly fissure. tubular adenoma. next TCS 10/2020.   COLONOSCOPY WITH PROPOFOL N/A 11/17/2018   Dr. Gala Romney: Nonbleeding internal hemorrhoids, hemorrhoids were moderate and grade.  Exam otherwise normal.  Colonoscopy in 5 years.   ELBOW SURGERY     right   ESOPHAGOGASTRODUODENOSCOPY  12/14/09   Dr. Gala Romney :schatzkis ring 26F, otherwise normal    ESOPHAGOGASTRODUODENOSCOPY N/A 10/29/2013   RMR: Distal esophageal pseudodiverticulum/Nissen fundoplication   ESOPHAGOGASTRODUODENOSCOPY (EGD) WITH PROPOFOL N/A 11/17/2018   Dr. Gala Romney: Normal esophagus status post dilation.  Nonbleeding erosive gastropathy with benign biopsy.   FINGER SURGERY     on right middle, pointer, and index fingers   FISSURECTOMY     several   INCISIONAL HERNIA REPAIR N/A 03/22/2014   Procedure: Brittney Martinez HERNIORRHAPHY;  Surgeon: Jamesetta So, MD;  Location: [facility];  Service: General;  Laterality: N/A;   INSERTION OF MESH N/A 03/22/2014   Procedure: INSERTION OF MESH;  Surgeon: Jamesetta So, MD;  Location: [facility];  Service: General;  Laterality: N/A;   MALONEY DILATION N/A 11/17/2018   Procedure: [name] DILATION;  Surgeon: Daneil Dolin, MD;  Location[facility];  Service: Endoscopy;  Laterality: N/A;   NISSEN FUNDOPLICATION  1856   [place]   PORTACATH PLACEMENT Left 07/12/2021   Procedure: INSERTION PORT-A-CATH;  Surgeon: Virl Cagey, MD;  Location: [facility];  Service: General;  Laterality: Left;   WRIST SURGERY     left    SOCIAL HISTORY:  Social History   Socioeconomic History   Marital status: Widowed    Spouse name: Not on file   Number of children: Not on file   Years of education: Not on file   Highest education level: Not on file  Occupational History   Occupation: disabled  Tobacco Use   Smoking status: Former    Packs/day: 0.25    Years: 19.00    Total pack years: 4.75    Types: Cigarettes    Quit date: 01/09/2007    Years since quitting: 14.7   Smokeless tobacco: Never  Vaping Use   Vaping Use: Never used  Substance and Sexual Activity   Alcohol use: No   Drug use: No   Sexual activity: Never  Other Topics Concern   Not on file  Social History Narrative   1 son-healthy   1 daughter-MVA (drunk-driver)   Social Determinants of Health   Financial Resource Strain: Not on file  Food Insecurity: Not on file   Transportation Needs: Not on file  Physical Activity: Not on file  Stress: Not on file  Social Connections: Not on file  Intimate Partner Violence: Not on file    FAMILY HISTORY:  Family History  Problem Relation Age of Onset   Colon cancer Father 74   Hypertension Sister    Hypertension Brother    Kidney disease Brother     CURRENT MEDICATIONS:  Current Outpatient Medications  Medication Sig Dispense Refill   albuterol (PROVENTIL HFA;VENTOLIN HFA) 108 (90 BASE) MCG/ACT inhaler Inhale 2 puffs into the lungs every 6 (six) hours as needed for wheezing or shortness of breath.     albuterol (PROVENTIL) (2.5 MG/3ML) 0.083% nebulizer solution Take 2.5 mg by nebulization every 6 (six) hours as needed for wheezing or shortness of breath.     allopurinol (ZYLOPRIM) 300 MG tablet Take 300 mg by  mouth daily.     amLODipine (NORVASC) 5 MG tablet Take 5 mg by mouth every morning.      aspirin 81 MG tablet Take 81 mg by mouth daily.     atorvastatin (LIPITOR) 20 MG tablet Take 20 mg by mouth at bedtime.     benzonatate (TESSALON) 200 MG capsule Take 400 mg by mouth every 8 (eight) hours as needed.     Biotin w/ Vitamins C & E (HAIR/SKIN/NAILS PO) Take 1 tablet by mouth daily.     budesonide-formoterol (SYMBICORT) 160-4.5 MCG/ACT inhaler Inhale 2 puffs into the lungs in the morning and at bedtime.     CARBOPLATIN IV Inject into the vein every 21 ( twenty-one) days.     cholecalciferol (VITAMIN D3) 25 MCG (1000 UNIT) tablet Take 1,000 Units by mouth daily.     clopidogrel (PLAVIX) 75 MG tablet Take 75 mg by mouth daily.     cycloSPORINE (RESTASIS) 0.05 % ophthalmic emulsion Place 1 drop into both eyes 2 (two) times daily.     dexamethasone (DECADRON) 4 MG tablet Take 8 mg each morning for 6 days, starting the first day after your next chemotherapy. 12 tablet 0   diphenhydrAMINE (BENADRYL) 25 MG tablet Take 25 mg by mouth daily as needed for allergies.     doxepin (SINEQUAN) 75 MG capsule Take 75  mg by mouth at bedtime.     esomeprazole (NEXIUM) 40 MG capsule Take 40 mg by mouth in the morning and at bedtime.     gabapentin (NEURONTIN) 300 MG capsule Take 1 capsule (300 mg total) by mouth 3 (three) times daily. If 1 capsule 3 times daily is not helping, you can increase to 2 capsules 3 times daily. 90 capsule 0   glipiZIDE (GLUCOTROL XL) 2.5 MG 24 hr tablet Take 2.5 mg by mouth daily with breakfast.     hyoscyamine (LEVSIN SL) 0.125 MG SL tablet Place 1 tablet (0.125 mg total) under the tongue every 6 (six) hours as needed for cramping. 90 tablet 0   ibuprofen (ADVIL) 200 MG tablet Take 400 mg by mouth every 6 (six) hours as needed for moderate pain.     levofloxacin (LEVAQUIN) 750 MG tablet Take 750 mg by mouth daily.     lidocaine-prilocaine (EMLA) cream Apply a small amount to port a cath site and cover with plastic wrap 1 hour prior to infusion appointments 30 g 3   loperamide (IMODIUM) 2 MG capsule Take 1 capsule (2 mg total) by mouth as needed for diarrhea or loose stools. Take 2 capsules after the first loose stool of the day.  For each subsequent loose stool take 1 capsule.  Do not take more than 8 capsules in a day. 60 capsule 0   lubiprostone (AMITIZA) 8 MCG capsule TAKE 1 CAPSULE BY MOUTH  ONCE DAILY WITH BREAKFAST (Patient taking differently: Take 8 mcg by mouth daily as needed for constipation.) 90 capsule 3   MELATONIN PO Take 1 capsule by mouth at bedtime as needed (sleep).     methocarbamol (ROBAXIN) 500 MG tablet Take 500 mg by mouth every 6 (six) hours as needed for muscle spasms.     metoprolol (TOPROL-XL) 100 MG 24 hr tablet Take 100 mg by mouth every morning.     ondansetron (ZOFRAN) 8 MG tablet Take 1 tablet (8 mg total) by mouth every 8 (eight) hours as needed for refractory nausea / vomiting. 30 tablet 0   PACLITAXEL IV Inject into the vein every 21 (  twenty-one) days.     predniSONE (DELTASONE) 10 MG tablet Take 10 mg by mouth daily.     predniSONE (DELTASONE) 5 MG  tablet Take 5 mg by mouth daily.     prochlorperazine (COMPAZINE) 10 MG tablet Take 1 tablet (10 mg total) by mouth every 6 (six) hours as needed (Nausea or vomiting). 30 tablet 1   traMADol (ULTRAM) 50 MG tablet Take 50-100 mg by mouth every 6 (six) hours as needed for moderate pain.     zolpidem (AMBIEN) 5 MG tablet Take 5 mg by mouth at bedtime as needed.     No current facility-administered medications for this visit.   Facility-Administered Medications Ordered in Other Visits  Medication Dose Route Frequency Provider Last Rate Last Admin   0.9 %  sodium chloride infusion   Intravenous Continuous Pennington, Rebekah M, PA-C 500 mL/hr at 09/25/21 1252 New Bag at 09/25/21 1252    ALLERGIES:  Allergies  Allergen Reactions   Lisinopril Anaphylaxis    Closing of throat   Aspirin Nausea Only   Propoxyphene N-Acetaminophen Nausea And Vomiting   Zithromax [Azithromycin] Nausea Only   Erythromycin Rash    PHYSICAL EXAM:  Performance status (ECOG): 1 - Symptomatic but completely ambulatory  There were no vitals filed for this visit. Wt Readings from Last 3 Encounters:  09/25/21 168 lb 11.2 oz (76.5 kg)  09/13/21 176 lb 5.9 oz (80 kg)  08/14/21 182 lb (82.6 kg)   Physical Exam Vitals reviewed.  Constitutional:      Appearance: Normal appearance. She is obese.  Cardiovascular:     Rate and Rhythm: Normal rate and regular rhythm.     Pulses: Normal pulses.     Heart sounds: Normal heart sounds.  Pulmonary:     Effort: Pulmonary effort is normal.     Breath sounds: Normal breath sounds.  Neurological:     General: No focal deficit present.     Mental Status: She is alert and oriented to person, place, and time.  Psychiatric:        Mood and Affect: Mood normal.        Behavior: Behavior normal.     LABORATORY DATA:  I have reviewed the labs as listed.     Latest Ref Rng & Units 09/25/2021    9:58 AM 09/19/2021    2:33 PM 06/29/2021   10:49 AM  CBC  WBC 4.0 - 10.5 K/uL  9.9  10.1  7.7   Hemoglobin 12.0 - 15.0 g/dL 11.0  9.7  15.2   Hematocrit 36.0 - 46.0 % 35.7  31.5  46.6   Platelets 150 - 400 K/uL 252  381  250       Latest Ref Rng & Units 09/25/2021    9:58 AM 09/19/2021    2:33 PM 06/29/2021   10:49 AM  CMP  Glucose 70 - 99 mg/dL 139  113  84   BUN 8 - 23 mg/dL 8  8  13    Creatinine 0.44 - 1.00 mg/dL 0.66  0.65  0.69   Sodium 135 - 145 mmol/L 137  139  143   Potassium 3.5 - 5.1 mmol/L 4.1  3.4  3.6   Chloride 98 - 111 mmol/L 105  108  108   CO2 22 - 32 mmol/L 24  24  26    Calcium 8.9 - 10.3 mg/dL 8.4  8.6  9.5   Total Protein 6.5 - 8.1 g/dL 7.2  7.0  7.9   Total  Bilirubin 0.3 - 1.2 mg/dL 0.8  0.4  0.6   Alkaline Phos 38 - 126 U/L 93  89  52   AST 15 - 41 U/L 23  19  30    ALT 0 - 44 U/L 25  20  37     DIAGNOSTIC IMAGING:  I have independently reviewed the scans and discussed with the patient. No results found.   ASSESSMENT:  Liver masses/mediastinal and left supraclavicular adenopathy: - She has felt left supraclavicular lymph node since November 2022. - She reported 26 pound weight loss in the last 6 to 8 months.  She reports she has been on Ozempic for the last 8 months.  Denies any fevers or night sweats although reports severe tiredness which is worsening.  No new pains. - She had a hysterectomy at age 68 due to cancer.  Unclear uterine or cervical.  Did not require any further therapies. - EGD/colonoscopy on 11/17/2018: Nonbleeding internal hemorrhoids, normal colon, normal esophagus, nonbleeding erosive gastropathy, normal duodenum. - CT soft tissue neck (05/24/2021): Cluster of mildly prominent lymph nodes in the lower neck bilaterally left greater than right. - CT CAP (05/24/2021): Mediastinal and left hilar adenopathy.  Numerous enhancing low-density lesions throughout the liver measuring up to 4.1 cm. - Liver biopsy (06/22/2021): Poorly differentiated carcinoma with necrosis.  IHC strong positivity for CK5/6, p63, PAX8.  Ki-67 with high  proliferative rate.  Negative for CK7, CD56, CK20, chromogranin, synaptophysin, TTF-1, Napsin A, marked 31, ER, GATA3 and CDX2.  Differential includes renal or gynecological primary. - PET scan (06/22/2021): Hypermetabolic lymphadenopathy throughout the neck, chest, liver metastasis, 9 mm hypermetabolic lesion in the lower pole of the left kidney suspicious for small RCC. - Left supraclavicular lymph node biopsy: Metastatic poorly differentiated carcinoma, suggestive of squamous cell carcinoma.  Tumor positive for CK5/6 and p63. - NGS testing: HER2 negative.  T p53 pathogenic variant present.  TMB-low.  MSI-stable.  LOH-high at 23%.  PD-L1 (TJ030) negative. - NGS test suggest 97% probability of squamous cell carcinoma. - Based on imaging, this appears most likely squamous cell carcinoma of the lung origin.    Social/family history: - She lives at home with her son and daughter-in-law.  She has been on disability secondary to IBS since 2001.  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  She is a current active smoker, 1/3 pack/day for the last 30 years.   PLAN:  Metastatic squamous cell lung cancer to the liver: - She has completed first cycle of carboplatin and paclitaxel. - She had Taxol induced leg pains.  She also had some tingling in the feet and hands.  She felt occasionally that she loses balance. - She had diarrhea for 3 days for which Lomotil helped.  Nausea was helped with Compazine.  Fatigue was present for couple of weeks. - Reviewed labs which showed normal LFTs.  CBC was grossly normal. - Will proceed with cycle 2 today.  Will decrease his dose of paclitaxel to 130 mg/m.  Will maintain carboplatin dose.  I am holding immunotherapy due to possibility of reactivation of rheumatoid arthritis. - We will give her Atarax if she develops any itching after the current cycle of chemotherapy. - RTC 3 weeks for follow-up.  2.  Intermittent nausea: -  Continue Compazine every 6 hours as needed.  3.  Rheumatoid arthritis of hands and knees: - She has rheumatoid arthritis for 13 years affecting hands and knees.  She was previously treated  with methotrexate and Humira. - Rheumatoid arthritis improved after first cycle of chemotherapy.  4.  Neuropathy: - She has tingling in the feet and hands. - Continue gabapentin 3 times daily.   Orders placed this encounter:  Orders Placed This Encounter  Procedures   CT CHEST [place], MD [place] [phone]   I, Thana Ates, am acting as a scribe for Dr. Derek Jack.  I, Derek Jack MD, have reviewed the above documentation for accuracy and completeness, and I agree with the above.

## 2021-10-11 NOTE — Patient Instructions (Signed)
[facility]  Discharge Instructions: Thank you for choosing [facility] to provide your oncology and hematology care.  If you have a lab appointment with the [facility], please come in thru the Main Entrance and check in at the main information desk.  Wear comfortable clothing and clothing appropriate for easy access to any Portacath or PICC line.   We strive to give you quality time with your provider. You may need to reschedule your appointment if you arrive late (15 or more minutes).  Arriving late affects you and other patients whose appointments are after yours.  Also, if you miss three or more appointments without notifying the office, you may be dismissed from the clinic at the provider's discretion.      For prescription refill requests, have your pharmacy contact our office and allow 72 hours for refills to be completed.    Today you received the following chemotherapy and/or immunotherapy agents Taxol and Carboplatin   To help prevent nausea and vomiting after your treatment, we encourage you to take your nausea medication as directed.  BELOW ARE SYMPTOMS THAT SHOULD BE REPORTED IMMEDIATELY: *FEVER GREATER THAN 100.4 F (38 C) OR HIGHER *CHILLS OR SWEATING *NAUSEA AND VOMITING THAT IS NOT CONTROLLED WITH YOUR NAUSEA MEDICATION *UNUSUAL SHORTNESS OF BREATH *UNUSUAL BRUISING OR BLEEDING *URINARY PROBLEMS (pain or burning when urinating, or frequent urination) *BOWEL PROBLEMS (unusual diarrhea, constipation, pain near the anus) TENDERNESS IN MOUTH AND THROAT WITH OR WITHOUT PRESENCE OF ULCERS (sore throat, sores in mouth, or a toothache) UNUSUAL RASH, SWELLING OR PAIN  UNUSUAL VAGINAL DISCHARGE OR ITCHING   Items with * indicate a potential emergency and should be followed up as soon as possible or go to the Emergency Department if any problems should occur.  Please show the CHEMOTHERAPY ALERT CARD or IMMUNOTHERAPY ALERT CARD at check-in to  the Emergency Department and triage nurse.  Should you have questions after your visit or need to cancel or reschedule your appointment, please contact [facility] [phone]  and follow the prompts.  Office hours are 8:00 a.m. to 4:30 p.m. Monday - Friday. Please note that voicemails left after 4:00 p.m. may not be returned until the following business day.  We are closed weekends and major holidays. You have access to a nurse at all times for urgent questions. Please call the main number to the clinic [phone] and follow the prompts.  For any non-urgent questions, you may also contact your provider using MyChart. We now offer e-Visits for anyone 8 and older to request care online for non-urgent symptoms. For details visit [email or web address].   Also download the MyChart app! Go to the app store, search "MyChart", open the app, select [facility], and log in with your MyChart username and password.  Masks are optional in the cancer centers. If you would like for your care team to wear a mask while they are taking care of you, please let them know. For doctor visits, patients may have with them one support person who is at least 67 years old. At this time, visitors are not allowed in the infusion area. Paclitaxel Injection What is this medication? PACLITAXEL (PAK li TAX el) treats some types of cancer. It works by slowing down the growth of cancer cells. This medicine may be used for other purposes; ask your health care provider or pharmacist if you have questions. COMMON BRAND NAME(S): Onxol, Taxol What should I tell my care team before I  take this medication? They need to know if you have any of these conditions: Heart disease Liver disease Low white blood cell levels An unusual or allergic reaction to paclitaxel, other medications, foods, dyes, or preservatives If you or your partner are pregnant or trying to get pregnant Breast-feeding How should I use this  medication? This medication is injected into a vein. It is given by your care team in a hospital or clinic setting. Talk to your care team about the use of this medication in children. While it may be given to children for selected conditions, precautions do apply. Overdosage: If you think you have taken too much of this medicine contact a poison control center or emergency room at once. NOTE: This medicine is only for you. Do not share this medicine with others. What if I miss a dose? Keep appointments for follow-up doses. It is important not to miss your dose. Call your care team if you are unable to keep an appointment. What may interact with this medication? Do not take this medication with any of the following: Live virus vaccines Other medications may affect the way this medication works. Talk with your care team about all of the medications you take. They may suggest changes to your treatment plan to lower the risk of side effects and to make sure your medications work as intended. This list may not describe all possible interactions. Give your health care provider a list of all the medicines, herbs, non-prescription drugs, or dietary supplements you use. Also tell them if you smoke, drink alcohol, or use illegal drugs. Some items may interact with your medicine. What should I watch for while using this medication? Your condition will be monitored carefully while you are receiving this medication. You may need blood work while taking this medication. This medication may make you feel generally unwell. This is not uncommon as chemotherapy can affect healthy cells as well as cancer cells. Report any side effects. Continue your course of treatment even though you feel ill unless your care team tells you to stop. This medication can cause serious allergic reactions. To reduce the risk, your care team may give you other medications to take before receiving this one. Be sure to follow the directions  from your care team. This medication may increase your risk of getting an infection. Call your care team for advice if you get a fever, chills, sore throat, or other symptoms of a cold or flu. Do not treat yourself. Try to avoid being around people who are sick. This medication may increase your risk to bruise or bleed. Call your care team if you notice any unusual bleeding. Be careful brushing or flossing your teeth or using a toothpick because you may get an infection or bleed more easily. If you have any dental work done, tell your dentist you are receiving this medication. Talk to your care team if you may be pregnant. Serious birth defects can occur if you take this medication during pregnancy. Talk to your care team before breastfeeding. Changes to your treatment plan may be needed. What side effects may I notice from receiving this medication? Side effects that you should report to your care team as soon as possible: Allergic reactions--skin rash, itching, hives, swelling of the face, lips, tongue, or throat Heart rhythm changes--fast or irregular heartbeat, dizziness, feeling faint or lightheaded, chest pain, trouble breathing Increase in blood pressure Infection--fever, chills, cough, sore throat, wounds that don't heal, pain or trouble when passing urine, general  feeling of discomfort or being unwell Low blood pressure--dizziness, feeling faint or lightheaded, blurry vision Low red blood cell level--unusual weakness or fatigue, dizziness, headache, trouble breathing Painful swelling, warmth, or redness of the skin, blisters or sores at the infusion site Pain, tingling, or numbness in the hands or feet Slow heartbeat--dizziness, feeling faint or lightheaded, confusion, trouble breathing, unusual weakness or fatigue Unusual bruising or bleeding Side effects that usually do not require medical attention (report to your care team if they continue or are bothersome): Diarrhea Hair  loss Joint pain Loss of appetite Muscle pain Nausea Vomiting This list may not describe all possible side effects. Call your doctor for medical advice about side effects. You may report side effects to FDA at 1-800-FDA-1088. Where should I keep my medication? This medication is given in a hospital or clinic. It will not be stored at home. NOTE: This sheet is a summary. It may not cover all possible information. If you have questions about this medicine, talk to your doctor, pharmacist, or health care provider.  2023 Elsevier/Gold Standard (2021-07-06 00:00:00)  Carboplatin Injection What is this medication? CARBOPLATIN (KAR boe pla tin) treats some types of cancer. It works by slowing down the growth of cancer cells. This medicine may be used for other purposes; ask your health care provider or pharmacist if you have questions. COMMON BRAND NAME(S): Paraplatin What should I tell my care team before I take this medication? They need to know if you have any of these conditions: Blood disorders Hearing problems Kidney disease Recent or ongoing radiation therapy An unusual or allergic reaction to carboplatin, cisplatin, other medications, foods, dyes, or preservatives Pregnant or trying to get pregnant Breast-feeding How should I use this medication? This medication is injected into a vein. It is given by your care team in a hospital or clinic setting. Talk to your care team about the use of this medication in children. Special care may be needed. Overdosage: If you think you have taken too much of this medicine contact a poison control center or emergency room at once. NOTE: This medicine is only for you. Do not share this medicine with others. What if I miss a dose? Keep appointments for follow-up doses. It is important not to miss your dose. Call your care team if you are unable to keep an appointment. What may interact with this medication? Medications for seizures Some  antibiotics, such as amikacin, gentamicin, neomycin, streptomycin, tobramycin Vaccines This list may not describe all possible interactions. Give your health care provider a list of all the medicines, herbs, non-prescription drugs, or dietary supplements you use. Also tell them if you smoke, drink alcohol, or use illegal drugs. Some items may interact with your medicine. What should I watch for while using this medication? Your condition will be monitored carefully while you are receiving this medication. You may need blood work while taking this medication. This medication may make you feel generally unwell. This is not uncommon, as chemotherapy can affect healthy cells as well as cancer cells. Report any side effects. Continue your course of treatment even though you feel ill unless your care team tells you to stop. In some cases, you may be given additional medications to help with side effects. Follow all directions for their use. This medication may increase your risk of getting an infection. Call your care team for advice if you get a fever, chills, sore throat, or other symptoms of a cold or flu. Do not treat yourself. Try to  avoid being around people who are sick. Avoid taking medications that contain aspirin, acetaminophen, ibuprofen, naproxen, or ketoprofen unless instructed by your care team. These medications may hide a fever. Be careful brushing or flossing your teeth or using a toothpick because you may get an infection or bleed more easily. If you have any dental work done, tell your dentist you are receiving this medication. Talk to your care team if you wish to become pregnant or think you might be pregnant. This medication can cause serious birth defects. Talk to your care team about effective forms of contraception. Do not breast-feed while taking this medication. What side effects may I notice from receiving this medication? Side effects that you should report to your care team as  soon as possible: Allergic reactions--skin rash, itching, hives, swelling of the face, lips, tongue, or throat Infection--fever, chills, cough, sore throat, wounds that don't heal, pain or trouble when passing urine, general feeling of discomfort or being unwell Low red blood cell level--unusual weakness or fatigue, dizziness, headache, trouble breathing Pain, tingling, or numbness in the hands or feet, muscle weakness, change in vision, confusion or trouble speaking, loss of balance or coordination, trouble walking, seizures Unusual bruising or bleeding Side effects that usually do not require medical attention (report to your care team if they continue or are bothersome): Hair loss Nausea Unusual weakness or fatigue Vomiting This list may not describe all possible side effects. Call your doctor for medical advice about side effects. You may report side effects to FDA at 1-800-FDA-1088. Where should I keep my medication? This medication is given in a hospital or clinic. It will not be stored at home. NOTE: This sheet is a summary. It may not cover all possible information. If you have questions about this medicine, talk to your doctor, pharmacist, or health care provider.  2023 Elsevier/Gold Standard (2021-06-13 00:00:00)

## 2021-10-12 ENCOUNTER — Telehealth: Payer: Self-pay | Admitting: Radiology

## 2021-10-12 ENCOUNTER — Encounter: Payer: Self-pay | Admitting: Hematology

## 2021-10-12 NOTE — Telephone Encounter (Signed)
[id number]: A RANDOMIZED TRIAL ADDRESSING CANCER-RELATED FINANCIAL HARDSHIP THROUGH DELIVERY OF A PROACTIVE FINANCIAL NAVIGATION INTERVENTION (CREDIT)   10/12/2021  PHONE CALL:  Spoke with patient and her daughter-in-law. Briefly explained study along with voluntary nature of study. Patient is interested in finding out more information. Per patient request, mailed consent documents to patient. Will follow up with patient next week.   Carol Ada, RT(R)(T) Clinical Research Coordinator

## 2021-10-12 NOTE — Telephone Encounter (Signed)
[id number]: A RANDOMIZED TRIAL ADDRESSING CANCER-RELATED FINANCIAL HARDSHIP THROUGH DELIVERY OF A PROACTIVE FINANCIAL NAVIGATION INTERVENTION (CREDIT)   10/12/2021  PHONE CALL: Spoke with patient to introduce the above mentioned study. Patient requested I call back later today.   Carol Ada, RT(R)(T) Clinical Research Coordinator

## 2021-10-13 ENCOUNTER — Inpatient Hospital Stay: Payer: Medicare Other

## 2021-10-13 ENCOUNTER — Encounter: Payer: Self-pay | Admitting: Dietician

## 2021-10-13 VITALS — BP 130/73 | HR 92 | Temp 97.4°F | Resp 18

## 2021-10-13 DIAGNOSIS — C787 Secondary malignant neoplasm of liver and intrahepatic bile duct: Secondary | ICD-10-CM | POA: Diagnosis not present

## 2021-10-13 DIAGNOSIS — C3490 Malignant neoplasm of unspecified part of unspecified bronchus or lung: Secondary | ICD-10-CM

## 2021-10-13 DIAGNOSIS — Z5111 Encounter for antineoplastic chemotherapy: Secondary | ICD-10-CM | POA: Diagnosis not present

## 2021-10-13 DIAGNOSIS — Z95828 Presence of other vascular implants and grafts: Secondary | ICD-10-CM

## 2021-10-13 DIAGNOSIS — Z79899 Other long term (current) drug therapy: Secondary | ICD-10-CM | POA: Diagnosis not present

## 2021-10-13 DIAGNOSIS — Z5189 Encounter for other specified aftercare: Secondary | ICD-10-CM | POA: Diagnosis not present

## 2021-10-13 MED ORDER — PEGFILGRASTIM INJECTION 6 MG/0.6ML ~~LOC~~
6.0000 mg | PREFILLED_SYRINGE | Freq: Once | SUBCUTANEOUS | Status: AC
  Administered 2021-10-13: 6 mg via SUBCUTANEOUS
  Filled 2021-10-13: qty 0.6

## 2021-10-13 NOTE — Patient Instructions (Signed)
Brittney Martinez  Discharge Instructions: Thank you for choosing [facility] to provide your oncology and hematology care.  If you have a lab appointment with the [facility], please come in thru the Main Entrance and check in at the main information desk.  Wear comfortable clothing and clothing appropriate for easy access to any Portacath or PICC line.   We strive to give you quality time with your provider. You may need to reschedule your appointment if you arrive late (15 or more minutes).  Arriving late affects you and other patients whose appointments are after yours.  Also, if you miss three or more appointments without notifying the office, you may be dismissed from the clinic at the provider's discretion.      For prescription refill requests, have your pharmacy contact our office and allow 72 hours for refills to be completed.    Today you received the following Neulesta injection return as scheduled.   To help prevent nausea and vomiting after your treatment, we encourage you to take your nausea medication as directed.  BELOW ARE SYMPTOMS THAT SHOULD BE REPORTED IMMEDIATELY: *FEVER GREATER THAN 100.4 F (38 C) OR HIGHER *CHILLS OR SWEATING *NAUSEA AND VOMITING THAT IS NOT CONTROLLED WITH YOUR NAUSEA MEDICATION *UNUSUAL SHORTNESS OF BREATH *UNUSUAL BRUISING OR BLEEDING *URINARY PROBLEMS (pain or burning when urinating, or frequent urination) *BOWEL PROBLEMS (unusual diarrhea, constipation, pain near the anus) TENDERNESS IN MOUTH AND THROAT WITH OR WITHOUT PRESENCE OF ULCERS (sore throat, sores in mouth, or a toothache) UNUSUAL RASH, SWELLING OR PAIN  UNUSUAL VAGINAL DISCHARGE OR ITCHING   Items with * indicate a potential emergency and should be followed up as soon as possible or go to the Emergency Department if any problems should occur.  Please show the CHEMOTHERAPY ALERT CARD or IMMUNOTHERAPY ALERT CARD at check-in to the Emergency Department  and triage nurse.  Should you have questions after your visit or need to cancel or reschedule your appointment, please contact [facility] [phone]  and follow the prompts.  Office hours are 8:00 a.m. to 4:30 p.m. Monday - Friday. Please note that voicemails left after 4:00 p.m. may not be returned until the following business day.  We are closed weekends and major holidays. You have access to a nurse at all times for urgent questions. Please call the main number to the clinic [phone] and follow the prompts.  For any non-urgent questions, you may also contact your provider using MyChart. We now offer e-Visits for anyone 68 and older to request care online for non-urgent symptoms. For details visit [email or web address].   Also download the MyChart app! Go to the app store, search "MyChart", open the app, select [facility], and log in with your MyChart username and password.  Masks are optional in the cancer centers. If you would like for your care team to wear a mask while they are taking care of you, please let them know. For doctor visits, patients may have with them one support person who is at least 68 years old. At this time, visitors are not allowed in the infusion area.

## 2021-10-13 NOTE — Progress Notes (Signed)
Provided one case Ensure Plus High Protein

## 2021-10-13 NOTE — Progress Notes (Signed)
Patient tolerated injection with no complaints voiced. Site clean and dry with no bruising or swelling noted at site. See MAR for details. Band aid applied.  Patient stable during and after injection. VSS with discharge and left in satisfactory condition with no s/s of distress noted.  

## 2021-10-19 ENCOUNTER — Other Ambulatory Visit: Payer: Self-pay

## 2021-10-19 DIAGNOSIS — B370 Candidal stomatitis: Secondary | ICD-10-CM

## 2021-10-19 DIAGNOSIS — G620 Drug-induced polyneuropathy: Secondary | ICD-10-CM

## 2021-10-19 MED ORDER — GABAPENTIN 300 MG PO CAPS: 600.0000 mg | ORAL_CAPSULE | Freq: Three times a day (TID) | ORAL | 1 refills | Status: DC

## 2021-10-19 MED ORDER — NYSTATIN 100000 UNIT/ML MT SUSP: 5.0000 mL | Freq: Four times a day (QID) | OROMUCOSAL | 1 refills | Status: DC | PRN

## 2021-11-01 ENCOUNTER — Encounter: Payer: Self-pay | Admitting: Radiology

## 2021-11-01 ENCOUNTER — Inpatient Hospital Stay: Payer: Medicare Other

## 2021-11-01 ENCOUNTER — Inpatient Hospital Stay (HOSPITAL_BASED_OUTPATIENT_CLINIC_OR_DEPARTMENT_OTHER): Payer: Medicare Other | Admitting: Hematology

## 2021-11-01 VITALS — BP 112/65 | HR 79 | Temp 97.6°F | Resp 18

## 2021-11-01 VITALS — BP 124/62 | HR 96 | Temp 97.6°F | Resp 20 | Ht 61.0 in | Wt 167.2 lb

## 2021-11-01 DIAGNOSIS — M069 Rheumatoid arthritis, unspecified: Secondary | ICD-10-CM | POA: Diagnosis not present

## 2021-11-01 DIAGNOSIS — Z95828 Presence of other vascular implants and grafts: Secondary | ICD-10-CM

## 2021-11-01 DIAGNOSIS — Z8673 Personal history of transient ischemic attack (TIA), and cerebral infarction without residual deficits: Secondary | ICD-10-CM | POA: Diagnosis not present

## 2021-11-01 DIAGNOSIS — J341 Cyst and mucocele of nose and nasal sinus: Secondary | ICD-10-CM | POA: Diagnosis not present

## 2021-11-01 DIAGNOSIS — J440 Chronic obstructive pulmonary disease with acute lower respiratory infection: Secondary | ICD-10-CM | POA: Diagnosis not present

## 2021-11-01 DIAGNOSIS — I10 Essential (primary) hypertension: Secondary | ICD-10-CM | POA: Diagnosis not present

## 2021-11-01 DIAGNOSIS — Z20822 Contact with and (suspected) exposure to covid-19: Secondary | ICD-10-CM | POA: Diagnosis not present

## 2021-11-01 DIAGNOSIS — J449 Chronic obstructive pulmonary disease, unspecified: Secondary | ICD-10-CM | POA: Diagnosis not present

## 2021-11-01 DIAGNOSIS — E785 Hyperlipidemia, unspecified: Secondary | ICD-10-CM | POA: Diagnosis not present

## 2021-11-01 DIAGNOSIS — Z006 Encounter for examination for normal comparison and control in clinical research program: Secondary | ICD-10-CM | POA: Diagnosis not present

## 2021-11-01 DIAGNOSIS — Z8249 Family history of ischemic heart disease and other diseases of the circulatory system: Secondary | ICD-10-CM | POA: Diagnosis not present

## 2021-11-01 DIAGNOSIS — J9601 Acute respiratory failure with hypoxia: Secondary | ICD-10-CM | POA: Diagnosis not present

## 2021-11-01 DIAGNOSIS — R1310 Dysphagia, unspecified: Secondary | ICD-10-CM | POA: Diagnosis not present

## 2021-11-01 DIAGNOSIS — I672 Cerebral atherosclerosis: Secondary | ICD-10-CM | POA: Diagnosis not present

## 2021-11-01 DIAGNOSIS — J690 Pneumonitis due to inhalation of food and vomit: Secondary | ICD-10-CM | POA: Diagnosis not present

## 2021-11-01 DIAGNOSIS — E119 Type 2 diabetes mellitus without complications: Secondary | ICD-10-CM | POA: Diagnosis not present

## 2021-11-01 DIAGNOSIS — H409 Unspecified glaucoma: Secondary | ICD-10-CM | POA: Diagnosis not present

## 2021-11-01 DIAGNOSIS — J029 Acute pharyngitis, unspecified: Secondary | ICD-10-CM | POA: Diagnosis not present

## 2021-11-01 DIAGNOSIS — Z79899 Other long term (current) drug therapy: Secondary | ICD-10-CM | POA: Diagnosis not present

## 2021-11-01 DIAGNOSIS — J189 Pneumonia, unspecified organism: Secondary | ICD-10-CM | POA: Diagnosis not present

## 2021-11-01 DIAGNOSIS — M4322 Fusion of spine, cervical region: Secondary | ICD-10-CM | POA: Diagnosis not present

## 2021-11-01 DIAGNOSIS — K219 Gastro-esophageal reflux disease without esophagitis: Secondary | ICD-10-CM | POA: Diagnosis not present

## 2021-11-01 DIAGNOSIS — C3490 Malignant neoplasm of unspecified part of unspecified bronchus or lung: Secondary | ICD-10-CM

## 2021-11-01 DIAGNOSIS — C787 Secondary malignant neoplasm of liver and intrahepatic bile duct: Secondary | ICD-10-CM | POA: Diagnosis not present

## 2021-11-01 DIAGNOSIS — Z7951 Long term (current) use of inhaled steroids: Secondary | ICD-10-CM | POA: Diagnosis not present

## 2021-11-01 DIAGNOSIS — J441 Chronic obstructive pulmonary disease with (acute) exacerbation: Secondary | ICD-10-CM | POA: Diagnosis not present

## 2021-11-01 DIAGNOSIS — M109 Gout, unspecified: Secondary | ICD-10-CM | POA: Diagnosis not present

## 2021-11-01 DIAGNOSIS — B370 Candidal stomatitis: Secondary | ICD-10-CM | POA: Diagnosis not present

## 2021-11-01 DIAGNOSIS — Z7902 Long term (current) use of antithrombotics/antiplatelets: Secondary | ICD-10-CM | POA: Diagnosis not present

## 2021-11-01 DIAGNOSIS — Z841 Family history of disorders of kidney and ureter: Secondary | ICD-10-CM | POA: Diagnosis not present

## 2021-11-01 DIAGNOSIS — I6523 Occlusion and stenosis of bilateral carotid arteries: Secondary | ICD-10-CM | POA: Diagnosis not present

## 2021-11-01 LAB — CBC WITH DIFFERENTIAL/PLATELET
Abs Immature Granulocytes: 0.01 10*3/uL (ref 0.00–0.07)
Basophils Absolute: 0 10*3/uL (ref 0.0–0.1)
Basophils Relative: 1 %
Eosinophils Absolute: 0.2 10*3/uL (ref 0.0–0.5)
Eosinophils Relative: 3 %
HCT: 32.3 % — ABNORMAL LOW (ref 36.0–46.0)
Hemoglobin: 9.9 g/dL — ABNORMAL LOW (ref 12.0–15.0)
Immature Granulocytes: 0 %
Lymphocytes Relative: 26 %
Lymphs Abs: 2 10*3/uL (ref 0.7–4.0)
MCH: 26.2 pg (ref 26.0–34.0)
MCHC: 30.7 g/dL (ref 30.0–36.0)
MCV: 85.4 fL (ref 80.0–100.0)
Monocytes Absolute: 0.8 10*3/uL (ref 0.1–1.0)
Monocytes Relative: 11 %
Neutro Abs: 4.5 10*3/uL (ref 1.7–7.7)
Neutrophils Relative %: 59 %
Platelets: 305 10*3/uL (ref 150–400)
RBC: 3.78 MIL/uL — ABNORMAL LOW (ref 3.87–5.11)
RDW: 19.7 % — ABNORMAL HIGH (ref 11.5–15.5)
WBC: 7.6 10*3/uL (ref 4.0–10.5)
nRBC: 0 % (ref 0.0–0.2)

## 2021-11-01 LAB — COMPREHENSIVE METABOLIC PANEL
ALT: 21 U/L (ref 0–44)
AST: 17 U/L (ref 15–41)
Albumin: 2.4 g/dL — ABNORMAL LOW (ref 3.5–5.0)
Alkaline Phosphatase: 100 U/L (ref 38–126)
Anion gap: 9 (ref 5–15)
BUN: 7 mg/dL — ABNORMAL LOW (ref 8–23)
CO2: 24 mmol/L (ref 22–32)
Calcium: 9 mg/dL (ref 8.9–10.3)
Chloride: 106 mmol/L (ref 98–111)
Creatinine, Ser: 0.64 mg/dL (ref 0.44–1.00)
GFR, Estimated: >60 mL/min (ref >60)
Glucose, Bld: 136 mg/dL — ABNORMAL HIGH (ref 70–99)
Potassium: 3.5 mmol/L (ref 3.5–5.1)
Sodium: 139 mmol/L (ref 135–145)
Total Bilirubin: 0.3 mg/dL (ref 0.3–1.2)
Total Protein: 6.8 g/dL (ref 6.5–8.1)

## 2021-11-01 LAB — MAGNESIUM: Magnesium: 1.6 mg/dL — ABNORMAL LOW (ref 1.7–2.4)

## 2021-11-01 MED ORDER — DIPHENHYDRAMINE HCL 50 MG/ML IJ SOLN
50.0000 mg | Freq: Once | INTRAMUSCULAR | Status: AC
  Administered 2021-11-01: 50 mg via INTRAVENOUS
  Filled 2021-11-01: qty 1

## 2021-11-01 MED ORDER — MAGNESIUM OXIDE -MG SUPPLEMENT 400 (240 MG) MG PO TABS: 400.0000 mg | ORAL_TABLET | Freq: Two times a day (BID) | ORAL | 3 refills | Status: DC

## 2021-11-01 MED ORDER — FAMOTIDINE IN NACL 20-0.9 MG/50ML-% IV SOLN
20.0000 mg | Freq: Once | INTRAVENOUS | Status: AC
  Administered 2021-11-01: 20 mg via INTRAVENOUS
  Filled 2021-11-01: qty 50

## 2021-11-01 MED ORDER — MAGNESIUM SULFATE 2 GM/50ML IV SOLN
2.0000 g | Freq: Once | INTRAVENOUS | Status: AC
  Administered 2021-11-01: 2 g via INTRAVENOUS
  Filled 2021-11-01: qty 50

## 2021-11-01 MED ORDER — SODIUM CHLORIDE 0.9% FLUSH
10.0000 mL | INTRAVENOUS | Status: DC | PRN
  Administered 2021-11-01 (×2): 10 mL

## 2021-11-01 MED ORDER — HEPARIN SOD (PORK) LOCK FLUSH 100 UNIT/ML IV SOLN
500.0000 [IU] | Freq: Once | INTRAVENOUS | Status: AC | PRN
  Administered 2021-11-01: 500 [IU]

## 2021-11-01 MED ORDER — SODIUM CHLORIDE 0.9 % IV SOLN
10.0000 mg | Freq: Once | INTRAVENOUS | Status: AC
  Administered 2021-11-01: 10 mg via INTRAVENOUS
  Filled 2021-11-01: qty 10

## 2021-11-01 MED ORDER — SODIUM CHLORIDE 0.9 % IV SOLN: Freq: Once | INTRAVENOUS | Status: AC

## 2021-11-01 MED ORDER — SODIUM CHLORIDE 0.9 % IV SOLN
133.3333 mg/m2 | Freq: Once | INTRAVENOUS | Status: AC
  Administered 2021-11-01: 240 mg via INTRAVENOUS
  Filled 2021-11-01: qty 40

## 2021-11-01 MED ORDER — SODIUM CHLORIDE 0.9 % IV SOLN
150.0000 mg | Freq: Once | INTRAVENOUS | Status: AC
  Administered 2021-11-01: 150 mg via INTRAVENOUS
  Filled 2021-11-01: qty 150

## 2021-11-01 MED ORDER — SODIUM CHLORIDE 0.9 % IV SOLN
452.5000 mg | Freq: Once | INTRAVENOUS | Status: AC
  Administered 2021-11-01: 450 mg via INTRAVENOUS
  Filled 2021-11-01: qty 45

## 2021-11-01 MED ORDER — PALONOSETRON HCL INJECTION 0.25 MG/5ML
0.2500 mg | Freq: Once | INTRAVENOUS | Status: AC
  Administered 2021-11-01: 0.25 mg via INTRAVENOUS
  Filled 2021-11-01: qty 5

## 2021-11-01 MED ORDER — ALBUTEROL SULFATE 0.63 MG/3ML IN NEBU
0.6300 mg | INHALATION_SOLUTION | Freq: Once | RESPIRATORY_TRACT | Status: AC
  Administered 2021-11-01: 0.63 mg via RESPIRATORY_TRACT
  Filled 2021-11-01: qty 3

## 2021-11-01 MED FILL — Albuterol Sulfate Soln Nebu 0.083% (2.5 MG/3ML): RESPIRATORY_TRACT | Qty: 3 | Status: AC

## 2021-11-01 NOTE — Progress Notes (Signed)
Patient tolerated treatment well with no complaints voiced.  Patient left via wheelchair in stable condition.  Vital signs stable at discharge.  Follow up as scheduled.

## 2021-11-01 NOTE — Research (Signed)
[id number]: A RANDOMIZED TRIAL ADDRESSING CANCER-RELATED FINANCIAL HARDSHIP THROUGH DELIVERY OF A PROACTIVE FINANCIAL NAVIGATION INTERVENTION (CREDIT)   11/01/21  ELIGIBILITY:  This Coordinator has reviewed this patient's inclusion and exclusion criteria and confirmed Brittney Martinez is eligible for study participation.  Patient will continue with enrollment.  Eligibility confirmed by research nurse and treating investigator, who also agrees that patient should proceed with enrollment.   CONSENT:  Patient Brittney Martinez was identified by Dr. Delton Coombes as a potential candidate for the above listed study.  This Clinical Research Coordinator met with Brittney Martinez, Brittney Martinez, on 11/01/21 in a manner and location that ensures patient privacy to discuss participation in the above listed research study.  Patient is Accompanied by her daughter-in-law .  A copy of the informed consent document and separate HIPAA Authorization was provided to the patient.  Patient reads, speaks, and understands Brittney Martinez.    Patient was provided with the business card of this Coordinator and encouraged to contact the research team with any questions.  Patient was provided the option of taking informed consent documents home to review and was encouraged to review at their convenience with their support network, including other care providers. Patient is comfortable with making a decision regarding study participation today. Patient was previously provided a copy of the consent via mail. Patient stated she has read and reviewed documents, but the consent was still reviewed page by page.   As outlined in the informed consent form, this Coordinator and Brittney Martinez discussed the purpose of the research study, the investigational nature of the study, study procedures and requirements for study participation, potential risks and benefits of study participation, as well as alternatives to participation. This study is not blinded. The  patient understands participation is voluntary and they may withdraw from study participation at any time.  Each study arm was reviewed, and randomization discussed.  This study does not involve an investigational drug or device. This study does not involve a placebo. Patient understands enrollment is pending full eligibility review.   Confidentiality and how the patient's information will be used as part of study participation were discussed.  Patient was informed there is not reimbursement provided for their time and effort spent on trial participation.  The patient is encouraged to discuss research study participation with their insurance provider to determine what costs they may incur as part of study participation, including research related injury.    All questions were answered to patient's satisfaction.  The informed consent and separate HIPAA Authorization was reviewed page by page.  The patient's mental and emotional status is appropriate to provide informed consent, and the patient verbalizes an understanding of study participation.  Patient has agreed to participate in the above listed research study and has voluntarily signed the informed consent dated 10/20/2021 version and separate HIPAA Authorization, version 5, [place] dated 11/23/2021  on 11/01/21 at 932AM.  The patient was provided with a copy of the signed informed consent form and separate HIPAA Authorization for their reference.  No study specific procedures were obtained prior to the signing of the informed consent document.  Approximately 30 minutes were spent with the patient reviewing the informed consent documents.  Patient was not requested to complete a Release of Information form.  After consent was signed, patient was provided all baseline questionnaires. Questionnaires were complete and the providers signed the registration worksheet.   Patient and daughter-in -law were thanked for their time and support of the above  mentioned study.  Patient Brittney Martinez is randomized to Arm A--the control arm.  Stratification criteria were confirmed by this clinical research Coordinator and clinical research nurse verified the stratification factors used for randomization.  As part of the assigned treatment arm, patient Brittney Martinez will receive control arm (videos only). MD will be notified of patient assignment.  Per study protocol, patient Brittney Martinez will be notified of their treatment assignment. Patient Brittney Martinez is successfully enrolled in the above study.

## 2021-11-01 NOTE — Patient Instructions (Signed)
[place] at [facility] Discharge Instructions   You were seen and examined today by Dr. Delton Coombes.  He reviewed your lab work which is normal/stable.   We will proceed with your treatment today.   We will give you some IV magnesium today as your magnesium is a little low. We also sent a prescription for magnesium to your pharmacy. Take as prescribed.   CT scan as scheduled.   Return as scheduled in 3 weeks.    Thank you for choosing [place] at [facility] to provide your oncology and hematology care.  To afford each patient quality time with our provider, please arrive at least 15 minutes before your scheduled appointment time.   If you have a lab appointment with the [place] please come in thru the Main Entrance and check in at the main information desk.  You need to re-schedule your appointment should you arrive 10 or more minutes late.  We strive to give you quality time with our providers, and arriving late affects you and other patients whose appointments are after yours.  Also, if you no show three or more times for appointments you may be dismissed from the clinic at the providers discretion.     Again, thank you for choosing [facility].  Our hope is that these requests will decrease the amount of time that you wait before being seen by our physicians.       _____________________________________________________________  Should you have questions after your visit to [facility], please contact our office at [phone] and follow the prompts.  Our office hours are 8:00 a.m. and 4:30 p.m. Monday - Friday.  Please note that voicemails left after 4:00 p.m. may not be returned until the following business day.  We are closed weekends and major holidays.  You do have access to a nurse 24-7, just call the main number to the clinic [phone] and do not press any options, hold on the line and a nurse  will answer the phone.    For prescription refill requests, have your pharmacy contact our office and allow 72 hours.    Due to Covid, you will need to wear a mask upon entering the hospital. If you do not have a mask, a mask will be given to you at the Main Entrance upon arrival. For doctor visits, patients may have 1 support person age 70 or older with them. For treatment visits, patients can not have anyone with them due to social distancing guidelines and our immunocompromised population.

## 2021-11-01 NOTE — Progress Notes (Signed)
Patient presents today for possible Taxol and Carboplatin. Patient and labs assessed by Dr. Delton Coombes, patient okay for treatment per MD, with additional orders for 2g of magnesium d/t magnesium level of 1.6, and albuterol nebulizer treatment.

## 2021-11-01 NOTE — Progress Notes (Signed)
Patient has been examined by Dr. Katragadda, and vital signs and labs have been reviewed. ANC, Creatinine, LFTs, hemoglobin, and platelets are within treatment parameters per M.D. - pt may proceed with treatment.  Primary RN and pharmacy notified.  

## 2021-11-01 NOTE — Research (Signed)
DCP-001: Use of a Clinical Trial Screening Tool to Address Cancer Health Disparities in the [employer] [facility] ([facility])   11/01/2021  CONSENT:  Patient Brittney Martinez was identified by Dr. Delton Coombes as a potential candidate for the above listed study.  This Clinical Research Coordinator met with Brittney Martinez, Brittney Martinez, on 11/01/21 in a manner and location that ensures patient privacy to discuss participation in the above listed research study.  Patient is Accompanied by her daughter in law .  A copy of the informed consent document and separate HIPAA Authorization was provided to the patient.  Patient reads, speaks, and understands [place].    Patient was provided with the business card of this Coordinator and encouraged to contact the research team with any questions.  Patient was provided the option of taking informed consent documents home to review and was encouraged to review at their convenience with their support network, including other care providers. Patient is comfortable with making a decision regarding study participation today.  As outlined in the informed consent form, this Coordinator and Brittney Martinez discussed the purpose of the research study, the investigational nature of the study, study procedures and requirements for study participation, potential risks and benefits of study participation, as well as alternatives to participation. This study is not blinded. The patient understands participation is voluntary and they may withdraw from study participation at any time.  This study does not involve randomization.  This study does not involve an investigational drug or device. This study does not involve a placebo. Patient understands enrollment is pending full eligibility review.   Confidentiality and how the patient's information will be used as part of study participation were discussed.  Patient was informed there is not reimbursement provided for their time  and effort spent on trial participation.  The patient is encouraged to discuss research study participation with their insurance provider to determine what costs they may incur as part of study participation, including research related injury.    All questions were answered to patient's satisfaction.  The informed consent and separate HIPAA Authorization was reviewed page by page.  The patient's mental and emotional status is appropriate to provide informed consent, and the patient verbalizes an understanding of study participation.  Patient has agreed to participate in the above listed research study and has voluntarily signed the informed consent dated 10/20/2021 version [enter version #] and separate HIPAA Authorization, version 15 dated until 10/10/2022  on 11/01/21 at 9:37AM.  The patient was provided with a copy of the signed informed consent form and separate HIPAA Authorization for their reference.  No study specific procedures were obtained prior to the signing of the informed consent document.  Approximately 15 minutes were spent with the patient reviewing the informed consent documents.  Patient was not requested to complete a Release of Information form.  After consent was signed, patient completed DCP-001 Worksheet.   Patient was thanked for her time and participation in the above mentioned study.   Carol Ada, RT(R)(T) Clinical Research Coordinator

## 2021-11-01 NOTE — Research (Signed)
[id number]: A RANDOMIZED TRIAL ADDRESSING CANCER-RELATED FINANCIAL HARDSHIP THROUGH DELIVERY OF A PROACTIVE FINANCIAL NAVIGATION INTERVENTION (CREDIT)  This Nurse has reviewed this patient's inclusion and exclusion criteria as a second review and confirms Brittney Martinez is eligible for study participation.  Patient may continue with enrollment.  Foye Spurling, BSN, RN, English as a second language teacher Nurse II 11/01/2021

## 2021-11-01 NOTE — Progress Notes (Signed)
[place], [place] [address]   CLINIC:  Medical Oncology/Hematology  PCP:  Glenda Chroman, MD [address] / [place] [address] [phone]   REASON FOR VISIT:  Follow-up for metastatic squamous cell lung cancer to the liver  PRIOR THERAPY: none  NGS Results: PD-L1 negative, no targetable mutations.  CURRENT THERAPY: Carboplatin and paclitaxel  BRIEF ONCOLOGIC HISTORY:  Oncology History  Squamous cell lung cancer, unspecified laterality ([place])  09/13/2021 Initial Diagnosis   Squamous cell lung cancer, unspecified laterality ([place])   09/20/2021 - 10/13/2021 Chemotherapy   Patient is on Treatment Plan : LUNG NSCLC Carboplatin / Paclitaxel q21d x 6 cycles     09/20/2021 -  Chemotherapy   Patient is on Treatment Plan : LUNG NSCLC Carboplatin + Paclitaxel q21d X 6 Cycles       CANCER STAGING:  Cancer Staging  Squamous cell lung cancer, unspecified laterality ([place]) Staging form: Lung, AJCC 8th Edition - Clinical stage from 09/13/2021: Stage IVB (cTX, cN3, pM1c) - Unsigned  INTERVAL HISTORY:  Ms. HARSHIKA MAGO, a 66 y.o. female, seen for follow-up of metastatic lung cancer.  She reported constant numbness in the toes and fingertips which is worse since last time.  She reported some dyspnea since morning today.  Saturations on the office were 90% and we had to place her on oxygen.  She reported pains in the knees and hips which stayed whole time after the last cycle 2.  Gabapentin did not help.  REVIEW OF SYSTEMS:  Review of Systems  Constitutional:  Negative for appetite change and fatigue.  Respiratory:  Positive for cough and shortness of breath.   Gastrointestinal:  Positive for nausea.  Neurological:  Positive for numbness (Constant numbness in the toes and fingertips).  All other systems reviewed and are negative.   PAST MEDICAL/SURGICAL HISTORY:  Past Medical History:  Diagnosis Date   Abdominal aortic stenosis    Anal fissure     Anxiety    Asthma    Complication of anesthesia    COPD (chronic obstructive pulmonary disease) ([place])    Depression    grief   Diabetes mellitus    Diverticulitis    GERD (gastroesophageal reflux disease)    Glaucoma    Gout    Hypertension    IBS (irritable bowel syndrome)    PONV (postoperative nausea and vomiting)    Port-A-Cath in place 09/19/2021   RA (rheumatoid arthritis) ([place])    Sleep apnea    had one at one time but not anymore   Stroke ([place])    "mini-stroke"- no deficits   TIA (transient ischemic attack)    2010. No deficits   Past Surgical History:  Procedure Laterality Date   ABDOMINAL HYSTERECTOMY  1982   complete   BIOPSY  11/17/2018   Procedure: BIOPSY;  Surgeon: Daneil Dolin, MD;  Location: [facility];  Service: Endoscopy;;   BREAST LUMPECTOMY     right-benign   CATARACT EXTRACTION W/PHACO Right 05/13/2017   Procedure: CATARACT EXTRACTION PHACO AND INTRAOCULAR LENS PLACEMENT ([place]);  Surgeon: Tonny Branch, MD;  Location: [facility] ORS;  Service: Ophthalmology;  Laterality: Right;  CDE: 8.59   CATARACT EXTRACTION W/PHACO Left 05/27/2017   Procedure: CATARACT EXTRACTION WITH  PHACOEMULSIFICATION AND INTRAOCULAR LENS PLACEMENT LEFT EYE;  Surgeon: Tonny Branch, MD;  Location: [facility] OR[facility];  Service: Ophthalmology;  Laterality: Left;  CDE: 5.66   CHOLECYSTECTOMY  2003   COLONOSCOPY  12/14/09  Dr. Gala Romney :anal papilla and hemorrhoids,diminutive hperplastic rectal polyps/normal colon   COLONOSCOPY N/A 10/29/2013   YBO:FBPZ papilla and internal hemorrhoids; colonic polyps-removed as described above. I suspect benign anorectal bleeding in the setting of hemorrhoids and  possibly fissure. tubular adenoma. next TCS 10/2020.   COLONOSCOPY WITH PROPOFOL N/A 11/17/2018   Dr. Gala Romney: Nonbleeding internal hemorrhoids, hemorrhoids were moderate and grade.  Exam otherwise normal.  Colonoscopy in 5 years.   ELBOW SURGERY     right   ESOPHAGOGASTRODUODENOSCOPY  12/14/09   Dr. Gala Romney :schatzkis  ring 73F, otherwise normal   ESOPHAGOGASTRODUODENOSCOPY N/A 10/29/2013   RMR: Distal esophageal pseudodiverticulum/Nissen fundoplication   ESOPHAGOGASTRODUODENOSCOPY (EGD) WITH PROPOFOL N/A 11/17/2018   Dr. Gala Romney: Normal esophagus status post dilation.  Nonbleeding erosive gastropathy with benign biopsy.   FINGER SURGERY     on right middle, pointer, and index fingers   FISSURECTOMY     several   INCISIONAL HERNIA REPAIR N/A 03/22/2014   Procedure: [name] HERNIORRHAPHY;  Surgeon: Jamesetta So, MD;  Location: [facility];  Service: General;  Laterality: N/A;   INSERTION OF MESH N/A 03/22/2014   Procedure: INSERTION OF MESH;  Surgeon: Jamesetta So, MD;  Location: [facility];  Service: General;  Laterality: N/A;   MALONEY DILATION N/A 11/17/2018   Procedure: [name] DILATION;  Surgeon: Daneil Dolin, MD;  Location: [facility];  Service: Endoscopy;  Laterality: N/A;   NISSEN FUNDOPLICATION  0258   [place]   PORTACATH PLACEMENT Left 07/12/2021   Procedure: INSERTION PORT-A-CATH;  Surgeon: Virl Cagey, MD;  Location: [facility];  Service: General;  Laterality: Left;   WRIST SURGERY     left    SOCIAL HISTORY:  Social History   Socioeconomic History   Marital status: Widowed    Spouse name: Not on file   Number of children: Not on file   Years of education: Not on file   Highest education level: Not on file  Occupational History   Occupation: disabled  Tobacco Use   Smoking status: Former    Packs/day: 0.25    Years: 19.00    Total pack years: 4.75    Types: Cigarettes    Quit date: 01/09/2007    Years since quitting: 14.8   Smokeless tobacco: Never  Vaping Use   Vaping Use: Never used  Substance and Sexual Activity   Alcohol use: No   Drug use: No   Sexual activity: Never  Other Topics Concern   Not on file  Social History Narrative   1 son-healthy   1 daughter-MVA (drunk-driver)   Social Determinants of Health   Financial Resource Strain: Not on file  Food  Insecurity: Not on file  Transportation Needs: Not on file  Physical Activity: Not on file  Stress: Not on file  Social Connections: Not on file  Intimate Partner Violence: Not on file    FAMILY HISTORY:  Family History  Problem Relation Age of Onset   Colon cancer Father 69   Hypertension Sister    Hypertension Brother    Kidney disease Brother     CURRENT MEDICATIONS:  Current Outpatient Medications  Medication Sig Dispense Refill   albuterol (PROVENTIL HFA;VENTOLIN HFA) 108 (90 BASE) MCG/ACT inhaler Inhale 2 puffs into the lungs every 6 (six) hours as needed for wheezing or shortness of breath.     albuterol (PROVENTIL) (2.5 MG/3ML) 0.083% nebulizer solution Take 2.5 mg by nebulization every 6 (six) hours as needed for wheezing or shortness of  breath.     allopurinol (ZYLOPRIM) 300 MG tablet Take 300 mg by mouth daily.     amLODipine (NORVASC) 5 MG tablet Take 5 mg by mouth every morning.      aspirin 81 MG tablet Take 81 mg by mouth daily.     atorvastatin (LIPITOR) 20 MG tablet Take 20 mg by mouth at bedtime.     benzonatate (TESSALON) 200 MG capsule Take 1 capsule (200 mg total) by mouth every 8 (eight) hours as needed. 30 capsule 2   Biotin w/ Vitamins C & E (HAIR/SKIN/NAILS PO) Take 1 tablet by mouth daily.     budesonide-formoterol (SYMBICORT) 160-4.5 MCG/ACT inhaler Inhale 2 puffs into the lungs in the morning and at bedtime.     CARBOPLATIN IV Inject into the vein every 21 ( twenty-one) days.     cholecalciferol (VITAMIN D3) 25 MCG (1000 UNIT) tablet Take 1,000 Units by mouth daily.     clopidogrel (PLAVIX) 75 MG tablet Take 75 mg by mouth daily. (Patient not taking: Reported on 10/13/2021)     cycloSPORINE (RESTASIS) 0.05 % ophthalmic emulsion Place 1 drop into both eyes 2 (two) times daily.     dexamethasone (DECADRON) 4 MG tablet Take 8 mg each morning for 6 days, starting the first day after your next chemotherapy. 12 tablet 0   diphenhydrAMINE (BENADRYL) 25 MG tablet  Take 25 mg by mouth daily as needed for allergies.     doxepin (SINEQUAN) 75 MG capsule Take 75 mg by mouth at bedtime.     esomeprazole (NEXIUM) 40 MG capsule Take 40 mg by mouth in the morning and at bedtime.     gabapentin (NEURONTIN) 300 MG capsule Take 2 capsules (600 mg total) by mouth 3 (three) times daily. If 1 capsule 3 times daily is not helping, you can increase to 2 capsules 3 times daily. 180 capsule 1   glipiZIDE (GLUCOTROL XL) 2.5 MG 24 hr tablet Take 2.5 mg by mouth daily with breakfast.     HYDROcodone bit-homatropine (HYCODAN) 5-1.5 MG/5ML syrup Take 5 mLs by mouth every 6 (six) hours as needed.     hydrOXYzine (ATARAX) 25 MG tablet Take 1 tablet (25 mg total) by mouth every 6 (six) hours as needed. 120 tablet 1   hyoscyamine (LEVSIN SL) 0.125 MG SL tablet Place 1 tablet (0.125 mg total) under the tongue every 6 (six) hours as needed for cramping. 90 tablet 0   ibuprofen (ADVIL) 200 MG tablet Take 400 mg by mouth every 6 (six) hours as needed for moderate pain.     levofloxacin (LEVAQUIN) 750 MG tablet Take 750 mg by mouth daily.     loperamide (IMODIUM) 2 MG capsule Take 1 capsule (2 mg total) by mouth as needed for diarrhea or loose stools. Take 2 capsules after the first loose stool of the day.  For each subsequent loose stool take 1 capsule.  Do not take more than 8 capsules in a day. 60 capsule 0   lubiprostone (AMITIZA) 8 MCG capsule TAKE 1 CAPSULE BY MOUTH  ONCE DAILY WITH BREAKFAST (Patient taking differently: Take 8 mcg by mouth daily as needed for constipation.) 90 capsule 3   MELATONIN PO Take 1 capsule by mouth at bedtime as needed (sleep).     methocarbamol (ROBAXIN) 500 MG tablet Take 500 mg by mouth every 6 (six) hours as needed for muscle spasms.     metoprolol (TOPROL-XL) 100 MG 24 hr tablet Take 100 mg by mouth every morning.  nystatin (MYCOSTATIN) 100000 UNIT/ML suspension Take 5 mLs (500,000 Units total) by mouth 4 (four) times daily as needed. SWISH AND  SWALLOW 240 mL 1   ondansetron (ZOFRAN) 8 MG tablet Take 1 tablet (8 mg total) by mouth every 8 (eight) hours as needed for refractory nausea / vomiting. 30 tablet 0   PACLITAXEL IV Inject into the vein every 21 ( twenty-one) days.     predniSONE (DELTASONE) 10 MG tablet Take 10 mg by mouth daily.     predniSONE (DELTASONE) 5 MG tablet Take 5 mg by mouth daily.     traMADol (ULTRAM) 50 MG tablet Take 50-100 mg by mouth every 6 (six) hours as needed for moderate pain.     zolpidem (AMBIEN) 5 MG tablet Take 5 mg by mouth at bedtime as needed.     No current facility-administered medications for this visit.   Facility-Administered Medications Ordered in Other Visits  Medication Dose Route Frequency Provider Last Rate Last Admin   0.9 %  sodium chloride infusion   Intravenous Continuous Pennington, Rebekah M, PA-C 500 mL/hr at 09/25/21 1252 New Bag at 09/25/21 1252   diphenhydrAMINE (BENADRYL) 50 MG/ML injection            famotidine (PEPCID) 20-0.9 MG/50ML-% IVPB            palonosetron (ALOXI) 0.25 MG/5ML injection             ALLERGIES:  Allergies  Allergen Reactions   Lisinopril Anaphylaxis    Closing of throat   Aspirin Nausea Only   Propoxyphene N-Acetaminophen Nausea And Vomiting   Zithromax [Azithromycin] Nausea Only   Erythromycin Rash    PHYSICAL EXAM:  Performance status (ECOG): 1 - Symptomatic but completely ambulatory  There were no vitals filed for this visit. Wt Readings from Last 3 Encounters:  10/11/21 170 lb 12.8 oz (77.5 kg)  09/25/21 168 lb 11.2 oz (76.5 kg)  09/13/21 176 lb 5.9 oz (80 kg)   Physical Exam Vitals reviewed.  Constitutional:      Appearance: Normal appearance. She is obese.  Cardiovascular:     Rate and Rhythm: Normal rate and regular rhythm.     Pulses: Normal pulses.     Heart sounds: Normal heart sounds.  Pulmonary:     Effort: Pulmonary effort is normal.     Breath sounds: Normal breath sounds.  Neurological:     General: No focal  deficit present.     Mental Status: She is alert and oriented to person, place, and time.  Psychiatric:        Mood and Affect: Mood normal.        Behavior: Behavior normal.     LABORATORY DATA:  I have reviewed the labs as listed.     Latest Ref Rng & Units 10/11/2021    8:22 AM 09/25/2021    9:58 AM 09/19/2021    2:33 PM  CBC  WBC 4.0 - 10.5 K/uL 9.5  9.9  10.1   Hemoglobin 12.0 - 15.0 g/dL 9.4  11.0  9.7   Hematocrit 36.0 - 46.0 % 30.9  35.7  31.5   Platelets 150 - 400 K/uL 488  252  381       Latest Ref Rng & Units 10/11/2021    8:22 AM 09/25/2021    9:58 AM 09/19/2021    2:33 PM  CMP  Glucose 70 - 99 mg/dL 98  139  113   BUN 8 - 23 mg/dL 8  8  8   Creatinine 0.44 - 1.00 mg/dL 0.61  0.66  0.65   Sodium 135 - 145 mmol/L 139  137  139   Potassium 3.5 - 5.1 mmol/L 3.6  4.1  3.4   Chloride 98 - 111 mmol/L 109  105  108   CO2 22 - 32 mmol/L 23  24  24    Calcium 8.9 - 10.3 mg/dL 8.7  8.4  8.6   Total Protein 6.5 - 8.1 g/dL 7.1  7.2  7.0   Total Bilirubin 0.3 - 1.2 mg/dL 0.2  0.8  0.4   Alkaline Phos 38 - 126 U/L 126  93  89   AST 15 - 41 U/L 19  23  19    ALT 0 - 44 U/L 28  25  20      DIAGNOSTIC IMAGING:  I have independently reviewed the scans and discussed with the patient. No results found.   ASSESSMENT:  Liver masses/mediastinal and left supraclavicular adenopathy: - She has felt left supraclavicular lymph node since November 2022. - She reported 26 pound weight loss in the last 6 to 8 months.  She reports she has been on Ozempic for the last 8 months.  Denies any fevers or night sweats although reports severe tiredness which is worsening.  No new pains. - She had a hysterectomy at age 47 due to cancer.  Unclear uterine or cervical.  Did not require any further therapies. - EGD/colonoscopy on 11/17/2018: Nonbleeding internal hemorrhoids, normal colon, normal esophagus, nonbleeding erosive gastropathy, normal duodenum. - CT soft tissue neck (05/24/2021): Cluster of mildly  prominent lymph nodes in the lower neck bilaterally left greater than right. - CT CAP (05/24/2021): Mediastinal and left hilar adenopathy.  Numerous enhancing low-density lesions throughout the liver measuring up to 4.1 cm. - Liver biopsy (06/22/2021): Poorly differentiated carcinoma with necrosis.  IHC strong positivity for CK5/6, p63, PAX8.  Ki-67 with high proliferative rate.  Negative for CK7, CD56, CK20, chromogranin, synaptophysin, TTF-1, Napsin A, marked 31, ER, GATA3 and CDX2.  Differential includes renal or gynecological primary. - PET scan (06/22/2021): Hypermetabolic lymphadenopathy throughout the neck, chest, liver metastasis, 9 mm hypermetabolic lesion in the lower pole of the left kidney suspicious for small RCC. - Left supraclavicular lymph node biopsy: Metastatic poorly differentiated carcinoma, suggestive of squamous cell carcinoma.  Tumor positive for CK5/6 and p63. - NGS testing: HER2 negative.  T p53 pathogenic variant present.  TMB-low.  MSI-stable.  LOH-high at 23%.  PD-L1 (QI347) negative. - NGS test suggest 97% probability of squamous cell carcinoma. - Based on imaging, this appears most likely squamous cell carcinoma of the lung origin.    Social/family history: - She lives at home with her son and daughter-in-law.  She has been on disability secondary to IBS since 2001.  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  She is a current active smoker, 1/3 pack/day for the last 30 years.   PLAN:  Metastatic squamous cell lung cancer to the liver: - She has reported more dyspnea on exertion today.  Saturations were 90%.  Auscultation reveals bilateral wheezing.  Will give albuterol nebulizer treatment. - She had more pains in the knees and hips after paclitaxel last cycle.  She will take tramadol.  We will continue paclitaxel at the same dose of 130 mg/m2. - Reviewed labs today which showed normal LFTs and creatinine.  CBC was grossly normal with  hemoglobin 9.9. - Proceed with cycle 3 today.  RTC  3 weeks for follow-up with repeat CT scan of the chest, abdomen and pelvis.   2.  Intermittent nausea: - Continue Compazine as needed.  3.  Rheumatoid arthritis of hands and knees (13 years): - Previously treated with methotrexate and Humira.  We are holding immunotherapy due to possible reactivation. - RA symptoms improved after starting chemotherapy.  4.  Neuropathy (numbness in the toes and fingertips): - Continue gabapentin 3 times daily. - She also has some pains in the knees and hips which stayed all 3 weeks after last cycle, Taxol induced.  Recommend taking tramadol 50 mg twice daily.        5.  Hypomagnesemia:        - We will start on magnesium twice daily.  She will receive magnesium 2 g today.   Orders placed this encounter:  Orders Placed This Encounter  Procedures   CBC with Differential   Comprehensive metabolic panel   CBC with Differential   Comprehensive metabolic panel   CBC with Differential   Comprehensive metabolic panel   CBC with Differential   Comprehensive metabolic panel   Magnesium   Magnesium   Magnesium   Magnesium      Derek Jack, MD [place] [phone]   I, Thana Ates, am acting as a scribe for Dr. Derek Jack.  I, Derek Jack MD, have reviewed the above documentation for accuracy and completeness, and I agree with the above.

## 2021-11-01 NOTE — Patient Instructions (Signed)
Brittney Martinez  Discharge Instructions: Thank you for choosing [facility] to provide your oncology and hematology care.  If you have a lab appointment with the [facility], please come in thru the Main Entrance and check in at the main information desk.  Wear comfortable clothing and clothing appropriate for easy access to any Portacath or PICC line.   We strive to give you quality time with your provider. You may need to reschedule your appointment if you arrive late (15 or more minutes).  Arriving late affects you and other patients whose appointments are after yours.  Also, if you miss three or more appointments without notifying the office, you may be dismissed from the clinic at the provider's discretion.      For prescription refill requests, have your pharmacy contact our office and allow 72 hours for refills to be completed.    Today you received the following chemotherapy and/or immunotherapy agents Taxol/Carbo.  Carboplatin Injection What is this medication? CARBOPLATIN (KAR boe pla tin) treats some types of cancer. It works by slowing down the growth of cancer cells. This medicine may be used for other purposes; ask your health care provider or pharmacist if you have questions. COMMON BRAND NAME(S): Paraplatin What should I tell my care team before I take this medication? They need to know if you have any of these conditions: Blood disorders Hearing problems Kidney disease Recent or ongoing radiation therapy An unusual or allergic reaction to carboplatin, cisplatin, other medications, foods, dyes, or preservatives Pregnant or trying to get pregnant Breast-feeding How should I use this medication? This medication is injected into a vein. It is given by your care team in a hospital or clinic setting. Talk to your care team about the use of this medication in children. Special care may be needed. Overdosage: If you think you have taken too much  of this medicine contact a poison control center or emergency room at once. NOTE: This medicine is only for you. Do not share this medicine with others. What if I miss a dose? Keep appointments for follow-up doses. It is important not to miss your dose. Call your care team if you are unable to keep an appointment. What may interact with this medication? Medications for seizures Some antibiotics, such as amikacin, gentamicin, neomycin, streptomycin, tobramycin Vaccines This list may not describe all possible interactions. Give your health care provider a list of all the medicines, herbs, non-prescription drugs, or dietary supplements you use. Also tell them if you smoke, drink alcohol, or use illegal drugs. Some items may interact with your medicine. What should I watch for while using this medication? Your condition will be monitored carefully while you are receiving this medication. You may need blood work while taking this medication. This medication may make you feel generally unwell. This is not uncommon, as chemotherapy can affect healthy cells as well as cancer cells. Report any side effects. Continue your course of treatment even though you feel ill unless your care team tells you to stop. In some cases, you may be given additional medications to help with side effects. Follow all directions for their use. This medication may increase your risk of getting an infection. Call your care team for advice if you get a fever, chills, sore throat, or other symptoms of a cold or flu. Do not treat yourself. Try to avoid being around people who are sick. Avoid taking medications that contain aspirin, acetaminophen, ibuprofen, naproxen, or ketoprofen unless  instructed by your care team. These medications may hide a fever. Be careful brushing or flossing your teeth or using a toothpick because you may get an infection or bleed more easily. If you have any dental work done, tell your dentist you are  receiving this medication. Talk to your care team if you wish to become pregnant or think you might be pregnant. This medication can cause serious birth defects. Talk to your care team about effective forms of contraception. Do not breast-feed while taking this medication. What side effects may I notice from receiving this medication? Side effects that you should report to your care team as soon as possible: Allergic reactions--skin rash, itching, hives, swelling of the face, lips, tongue, or throat Infection--fever, chills, cough, sore throat, wounds that don't heal, pain or trouble when passing urine, general feeling of discomfort or being unwell Low red blood cell level--unusual weakness or fatigue, dizziness, headache, trouble breathing Pain, tingling, or numbness in the hands or feet, muscle weakness, change in vision, confusion or trouble speaking, loss of balance or coordination, trouble walking, seizures Unusual bruising or bleeding Side effects that usually do not require medical attention (report to your care team if they continue or are bothersome): Hair loss Nausea Unusual weakness or fatigue Vomiting This list may not describe all possible side effects. Call your doctor for medical advice about side effects. You may report side effects to FDA at 1-800-FDA-1088. Where should I keep my medication? This medication is given in a hospital or clinic. It will not be stored at home. NOTE: This sheet is a summary. It may not cover all possible information. If you have questions about this medicine, talk to your doctor, pharmacist, or health care provider.  2023 Elsevier/Gold Standard (2021-06-13 00:00:00)   Paclitaxel Injection What is this medication? PACLITAXEL (PAK li TAX el) treats some types of cancer. It works by slowing down the growth of cancer cells. This medicine may be used for other purposes; ask your health care provider or pharmacist if you have questions. COMMON BRAND  NAME(S): Onxol, Taxol What should I tell my care team before I take this medication? They need to know if you have any of these conditions: Heart disease Liver disease Low white blood cell levels An unusual or allergic reaction to paclitaxel, other medications, foods, dyes, or preservatives If you or your partner are pregnant or trying to get pregnant Breast-feeding How should I use this medication? This medication is injected into a vein. It is given by your care team in a hospital or clinic setting. Talk to your care team about the use of this medication in children. While it may be given to children for selected conditions, precautions do apply. Overdosage: If you think you have taken too much of this medicine contact a poison control center or emergency room at once. NOTE: This medicine is only for you. Do not share this medicine with others. What if I miss a dose? Keep appointments for follow-up doses. It is important not to miss your dose. Call your care team if you are unable to keep an appointment. What may interact with this medication? Do not take this medication with any of the following: Live virus vaccines Other medications may affect the way this medication works. Talk with your care team about all of the medications you take. They may suggest changes to your treatment plan to lower the risk of side effects and to make sure your medications work as intended. This list may not  describe all possible interactions. Give your health care provider a list of all the medicines, herbs, non-prescription drugs, or dietary supplements you use. Also tell them if you smoke, drink alcohol, or use illegal drugs. Some items may interact with your medicine. What should I watch for while using this medication? Your condition will be monitored carefully while you are receiving this medication. You may need blood work while taking this medication. This medication may make you feel generally unwell.  This is not uncommon as chemotherapy can affect healthy cells as well as cancer cells. Report any side effects. Continue your course of treatment even though you feel ill unless your care team tells you to stop. This medication can cause serious allergic reactions. To reduce the risk, your care team may give you other medications to take before receiving this one. Be sure to follow the directions from your care team. This medication may increase your risk of getting an infection. Call your care team for advice if you get a fever, chills, sore throat, or other symptoms of a cold or flu. Do not treat yourself. Try to avoid being around people who are sick. This medication may increase your risk to bruise or bleed. Call your care team if you notice any unusual bleeding. Be careful brushing or flossing your teeth or using a toothpick because you may get an infection or bleed more easily. If you have any dental work done, tell your dentist you are receiving this medication. Talk to your care team if you may be pregnant. Serious birth defects can occur if you take this medication during pregnancy. Talk to your care team before breastfeeding. Changes to your treatment plan may be needed. What side effects may I notice from receiving this medication? Side effects that you should report to your care team as soon as possible: Allergic reactions--skin rash, itching, hives, swelling of the face, lips, tongue, or throat Heart rhythm changes--fast or irregular heartbeat, dizziness, feeling faint or lightheaded, chest pain, trouble breathing Increase in blood pressure Infection--fever, chills, cough, sore throat, wounds that don't heal, pain or trouble when passing urine, general feeling of discomfort or being unwell Low blood pressure--dizziness, feeling faint or lightheaded, blurry vision Low red blood cell level--unusual weakness or fatigue, dizziness, headache, trouble breathing Painful swelling, warmth, or  redness of the skin, blisters or sores at the infusion site Pain, tingling, or numbness in the hands or feet Slow heartbeat--dizziness, feeling faint or lightheaded, confusion, trouble breathing, unusual weakness or fatigue Unusual bruising or bleeding Side effects that usually do not require medical attention (report to your care team if they continue or are bothersome): Diarrhea Hair loss Joint pain Loss of appetite Muscle pain Nausea Vomiting This list may not describe all possible side effects. Call your doctor for medical advice about side effects. You may report side effects to FDA at 1-800-FDA-1088. Where should I keep my medication? This medication is given in a hospital or clinic. It will not be stored at home. NOTE: This sheet is a summary. It may not cover all possible information. If you have questions about this medicine, talk to your doctor, pharmacist, or health care provider.  2023 Elsevier/Gold Standard (2021-07-06 00:00:00)        To help prevent nausea and vomiting after your treatment, we encourage you to take your nausea medication as directed.  BELOW ARE SYMPTOMS THAT SHOULD BE REPORTED IMMEDIATELY: *FEVER GREATER THAN 100.4 F (38 C) OR HIGHER *CHILLS OR SWEATING *NAUSEA AND VOMITING THAT IS  NOT CONTROLLED WITH YOUR NAUSEA MEDICATION *UNUSUAL SHORTNESS OF BREATH *UNUSUAL BRUISING OR BLEEDING *URINARY PROBLEMS (pain or burning when urinating, or frequent urination) *BOWEL PROBLEMS (unusual diarrhea, constipation, pain near the anus) TENDERNESS IN MOUTH AND THROAT WITH OR WITHOUT PRESENCE OF ULCERS (sore throat, sores in mouth, or a toothache) UNUSUAL RASH, SWELLING OR PAIN  UNUSUAL VAGINAL DISCHARGE OR ITCHING   Items with * indicate a potential emergency and should be followed up as soon as possible or go to the Emergency Department if any problems should occur.  Please show the CHEMOTHERAPY ALERT CARD or IMMUNOTHERAPY ALERT CARD at check-in to the  Emergency Department and triage nurse.  Should you have questions after your visit or need to cancel or reschedule your appointment, please contact [facility] [phone]  and follow the prompts.  Office hours are 8:00 a.m. to 4:30 p.m. Monday - Friday. Please note that voicemails left after 4:00 p.m. may not be returned until the following business day.  We are closed weekends and major holidays. You have access to a nurse at all times for urgent questions. Please call the main number to the clinic [phone] and follow the prompts.  For any non-urgent questions, you may also contact your provider using MyChart. We now offer e-Visits for anyone 51 and older to request care online for non-urgent symptoms. For details visit [email or web address].   Also download the MyChart app! Go to the app store, search "MyChart", open the app, select [facility], and log in with your MyChart username and password.  Masks are optional in the cancer centers. If you would like for your care team to wear a mask while they are taking care of you, please let them know. You may have one support person who is at least 68 years old accompany you for your appointments.

## 2021-11-02 ENCOUNTER — Telehealth: Payer: Self-pay | Admitting: Radiology

## 2021-11-02 ENCOUNTER — Encounter: Payer: Self-pay | Admitting: Hematology

## 2021-11-02 NOTE — Telephone Encounter (Signed)
[id number]: A RANDOMIZED TRIAL ADDRESSING CANCER-RELATED FINANCIAL HARDSHIP THROUGH DELIVERY OF A PROACTIVE FINANCIAL NAVIGATION INTERVENTION (CREDIT)   11/02/21  PHONE CALL: Confirmed I was speaking with Brittney Martinez . Informed patient reason for call is to let her know she was randomized to the control group (videos only). Will mail patient information on how to access videos. Next visit with patient will be at the end of November for a 3 month follow-up. Connected patient with social work at [employer] in order to find alternative resources for patient. Thanked patient for her time and support of the above mentioned study.   Carol Ada, RT(R)(T) Clinical Research Coordinator

## 2021-11-03 ENCOUNTER — Inpatient Hospital Stay (HOSPITAL_COMMUNITY)
Admission: EM | Admit: 2021-11-03 | Discharge: 2021-11-05 | DRG: 177 | Disposition: A | Discharge status: Home / Self Care | Payer: Medicare Other | Attending: Internal Medicine | Admitting: Internal Medicine

## 2021-11-03 ENCOUNTER — Other Ambulatory Visit: Payer: Self-pay

## 2021-11-03 ENCOUNTER — Inpatient Hospital Stay: Payer: Medicare Other | Attending: Hematology

## 2021-11-03 ENCOUNTER — Emergency Department (HOSPITAL_COMMUNITY): Payer: Medicare Other

## 2021-11-03 ENCOUNTER — Encounter (HOSPITAL_COMMUNITY): Payer: Self-pay | Admitting: *Deleted

## 2021-11-03 VITALS — BP 128/81 | HR 84 | Temp 97.0°F | Resp 18

## 2021-11-03 DIAGNOSIS — I10 Essential (primary) hypertension: Secondary | ICD-10-CM | POA: Diagnosis present

## 2021-11-03 DIAGNOSIS — E119 Type 2 diabetes mellitus without complications: Secondary | ICD-10-CM | POA: Diagnosis present

## 2021-11-03 DIAGNOSIS — K589 Irritable bowel syndrome without diarrhea: Secondary | ICD-10-CM | POA: Insufficient documentation

## 2021-11-03 DIAGNOSIS — B370 Candidal stomatitis: Secondary | ICD-10-CM | POA: Diagnosis present

## 2021-11-03 DIAGNOSIS — J449 Chronic obstructive pulmonary disease, unspecified: Secondary | ICD-10-CM

## 2021-11-03 DIAGNOSIS — Z888 Allergy status to other drugs, medicaments and biological substances status: Secondary | ICD-10-CM

## 2021-11-03 DIAGNOSIS — T451X5A Adverse effect of antineoplastic and immunosuppressive drugs, initial encounter: Secondary | ICD-10-CM | POA: Diagnosis present

## 2021-11-03 DIAGNOSIS — Z79899 Other long term (current) drug therapy: Secondary | ICD-10-CM | POA: Insufficient documentation

## 2021-11-03 DIAGNOSIS — Z8673 Personal history of transient ischemic attack (TIA), and cerebral infarction without residual deficits: Secondary | ICD-10-CM

## 2021-11-03 DIAGNOSIS — F17200 Nicotine dependence, unspecified, uncomplicated: Secondary | ICD-10-CM | POA: Insufficient documentation

## 2021-11-03 DIAGNOSIS — Z7951 Long term (current) use of inhaled steroids: Secondary | ICD-10-CM

## 2021-11-03 DIAGNOSIS — E785 Hyperlipidemia, unspecified: Secondary | ICD-10-CM | POA: Diagnosis present

## 2021-11-03 DIAGNOSIS — Z006 Encounter for examination for normal comparison and control in clinical research program: Secondary | ICD-10-CM

## 2021-11-03 DIAGNOSIS — Z886 Allergy status to analgesic agent status: Secondary | ICD-10-CM

## 2021-11-03 DIAGNOSIS — R110 Nausea: Secondary | ICD-10-CM | POA: Insufficient documentation

## 2021-11-03 DIAGNOSIS — Z800 Family history of malignant neoplasm of digestive organs: Secondary | ICD-10-CM

## 2021-11-03 DIAGNOSIS — C3490 Malignant neoplasm of unspecified part of unspecified bronchus or lung: Secondary | ICD-10-CM

## 2021-11-03 DIAGNOSIS — C787 Secondary malignant neoplasm of liver and intrahepatic bile duct: Secondary | ICD-10-CM | POA: Insufficient documentation

## 2021-11-03 DIAGNOSIS — J440 Chronic obstructive pulmonary disease with acute lower respiratory infection: Secondary | ICD-10-CM | POA: Diagnosis present

## 2021-11-03 DIAGNOSIS — G620 Drug-induced polyneuropathy: Secondary | ICD-10-CM | POA: Insufficient documentation

## 2021-11-03 DIAGNOSIS — Z95828 Presence of other vascular implants and grafts: Secondary | ICD-10-CM

## 2021-11-03 DIAGNOSIS — J189 Pneumonia, unspecified organism: Secondary | ICD-10-CM

## 2021-11-03 DIAGNOSIS — R000 Tachycardia, unspecified: Secondary | ICD-10-CM | POA: Insufficient documentation

## 2021-11-03 DIAGNOSIS — R1310 Dysphagia, unspecified: Secondary | ICD-10-CM

## 2021-11-03 DIAGNOSIS — Z87891 Personal history of nicotine dependence: Secondary | ICD-10-CM

## 2021-11-03 DIAGNOSIS — Z7902 Long term (current) use of antithrombotics/antiplatelets: Secondary | ICD-10-CM

## 2021-11-03 DIAGNOSIS — Z5189 Encounter for other specified aftercare: Secondary | ICD-10-CM | POA: Insufficient documentation

## 2021-11-03 DIAGNOSIS — J690 Pneumonitis due to inhalation of food and vomit: Principal | ICD-10-CM | POA: Diagnosis present

## 2021-11-03 DIAGNOSIS — M109 Gout, unspecified: Secondary | ICD-10-CM | POA: Diagnosis present

## 2021-11-03 DIAGNOSIS — Z5111 Encounter for antineoplastic chemotherapy: Secondary | ICD-10-CM | POA: Insufficient documentation

## 2021-11-03 DIAGNOSIS — M069 Rheumatoid arthritis, unspecified: Secondary | ICD-10-CM | POA: Insufficient documentation

## 2021-11-03 DIAGNOSIS — Z20822 Contact with and (suspected) exposure to covid-19: Secondary | ICD-10-CM | POA: Diagnosis present

## 2021-11-03 DIAGNOSIS — Z8249 Family history of ischemic heart disease and other diseases of the circulatory system: Secondary | ICD-10-CM

## 2021-11-03 DIAGNOSIS — K219 Gastro-esophageal reflux disease without esophagitis: Secondary | ICD-10-CM | POA: Diagnosis present

## 2021-11-03 DIAGNOSIS — Z90710 Acquired absence of both cervix and uterus: Secondary | ICD-10-CM

## 2021-11-03 DIAGNOSIS — J9601 Acute respiratory failure with hypoxia: Secondary | ICD-10-CM | POA: Diagnosis present

## 2021-11-03 DIAGNOSIS — Z9049 Acquired absence of other specified parts of digestive tract: Secondary | ICD-10-CM

## 2021-11-03 DIAGNOSIS — Z881 Allergy status to other antibiotic agents status: Secondary | ICD-10-CM

## 2021-11-03 DIAGNOSIS — Z841 Family history of disorders of kidney and ureter: Secondary | ICD-10-CM

## 2021-11-03 DIAGNOSIS — J441 Chronic obstructive pulmonary disease with (acute) exacerbation: Secondary | ICD-10-CM | POA: Diagnosis present

## 2021-11-03 DIAGNOSIS — H409 Unspecified glaucoma: Secondary | ICD-10-CM | POA: Diagnosis present

## 2021-11-03 LAB — CBC WITH DIFFERENTIAL/PLATELET
Abs Immature Granulocytes: 0.13 10*3/uL — ABNORMAL HIGH (ref 0.00–0.07)
Basophils Absolute: 0 10*3/uL (ref 0.0–0.1)
Basophils Relative: 0 %
Eosinophils Absolute: 0.1 10*3/uL (ref 0.0–0.5)
Eosinophils Relative: 1 %
HCT: 35.1 % — ABNORMAL LOW (ref 36.0–46.0)
Hemoglobin: 10.7 g/dL — ABNORMAL LOW (ref 12.0–15.0)
Immature Granulocytes: 1 %
Lymphocytes Relative: 13 %
Lymphs Abs: 2 10*3/uL (ref 0.7–4.0)
MCH: 25.8 pg — ABNORMAL LOW (ref 26.0–34.0)
MCHC: 30.5 g/dL (ref 30.0–36.0)
MCV: 84.8 fL (ref 80.0–100.0)
Monocytes Absolute: 0.2 10*3/uL (ref 0.1–1.0)
Monocytes Relative: 1 %
Neutro Abs: 13.1 10*3/uL — ABNORMAL HIGH (ref 1.7–7.7)
Neutrophils Relative %: 84 %
Platelets: 318 10*3/uL (ref 150–400)
RBC: 4.14 MIL/uL (ref 3.87–5.11)
RDW: 19.6 % — ABNORMAL HIGH (ref 11.5–15.5)
WBC: 15.6 10*3/uL — ABNORMAL HIGH (ref 4.0–10.5)
nRBC: 0 % (ref 0.0–0.2)

## 2021-11-03 LAB — COMPREHENSIVE METABOLIC PANEL
ALT: 26 U/L (ref 0–44)
AST: 39 U/L (ref 15–41)
Albumin: 2.8 g/dL — ABNORMAL LOW (ref 3.5–5.0)
Alkaline Phosphatase: 102 U/L (ref 38–126)
Anion gap: 10 (ref 5–15)
BUN: 13 mg/dL (ref 8–23)
CO2: 25 mmol/L (ref 22–32)
Calcium: 8.9 mg/dL (ref 8.9–10.3)
Chloride: 104 mmol/L (ref 98–111)
Creatinine, Ser: 0.57 mg/dL (ref 0.44–1.00)
GFR, Estimated: >60 mL/min (ref >60)
Glucose, Bld: 67 mg/dL — ABNORMAL LOW (ref 70–99)
Potassium: 4.9 mmol/L (ref 3.5–5.1)
Sodium: 139 mmol/L (ref 135–145)
Total Bilirubin: 1.2 mg/dL (ref 0.3–1.2)
Total Protein: 7.5 g/dL (ref 6.5–8.1)

## 2021-11-03 LAB — CBG MONITORING, ED: Glucose-Capillary: 83 mg/dL (ref 70–99)

## 2021-11-03 MED ORDER — PEGFILGRASTIM INJECTION 6 MG/0.6ML ~~LOC~~
6.0000 mg | PREFILLED_SYRINGE | Freq: Once | SUBCUTANEOUS | Status: AC
  Administered 2021-11-03: 6 mg via SUBCUTANEOUS
  Filled 2021-11-03: qty 0.6

## 2021-11-03 MED ORDER — ACETAMINOPHEN 500 MG PO TABS
1000.0000 mg | ORAL_TABLET | Freq: Once | ORAL | Status: AC
  Administered 2021-11-03: 1000 mg via ORAL
  Filled 2021-11-03: qty 2

## 2021-11-03 MED ORDER — MAGIC MOUTHWASH W/LIDOCAINE
15.0000 mL | Freq: Once | ORAL | Status: DC
  Filled 2021-11-03: qty 15

## 2021-11-03 MED ORDER — MAGIC MOUTHWASH W/LIDOCAINE
10.0000 mL | Freq: Once | ORAL | Status: AC
  Administered 2021-11-03: 10 mL via ORAL
  Filled 2021-11-03: qty 10

## 2021-11-03 MED ORDER — IOHEXOL 300 MG/ML  SOLN
75.0000 mL | Freq: Once | INTRAMUSCULAR | Status: AC | PRN
  Administered 2021-11-03: 75 mL via INTRAVENOUS

## 2021-11-03 NOTE — ED Notes (Signed)
Pt satting 88-91 on room air, pt placed on 2L O2 via [place], pt satting 98% on 2L

## 2021-11-03 NOTE — Patient Instructions (Signed)
[facility]  Discharge Instructions: Thank you for choosing [facility] to provide your oncology and hematology care.  If you have a lab appointment with the [facility], please come in thru the Main Entrance and check in at the main information desk.  Wear comfortable clothing and clothing appropriate for easy access to any Portacath or PICC line.   We strive to give you quality time with your provider. You may need to reschedule your appointment if you arrive late (15 or more minutes).  Arriving late affects you and other patients whose appointments are after yours.  Also, if you miss three or more appointments without notifying the office, you may be dismissed from the clinic at the provider's discretion.      For prescription refill requests, have your pharmacy contact our office and allow 72 hours for refills to be completed.    Today you received Neulasta injection.     BELOW ARE SYMPTOMS THAT SHOULD BE REPORTED IMMEDIATELY: *FEVER GREATER THAN 100.4 F (38 C) OR HIGHER *CHILLS OR SWEATING *NAUSEA AND VOMITING THAT IS NOT CONTROLLED WITH YOUR NAUSEA MEDICATION *UNUSUAL SHORTNESS OF BREATH *UNUSUAL BRUISING OR BLEEDING *URINARY PROBLEMS (pain or burning when urinating, or frequent urination) *BOWEL PROBLEMS (unusual diarrhea, constipation, pain near the anus) TENDERNESS IN MOUTH AND THROAT WITH OR WITHOUT PRESENCE OF ULCERS (sore throat, sores in mouth, or a toothache) UNUSUAL RASH, SWELLING OR PAIN  UNUSUAL VAGINAL DISCHARGE OR ITCHING   Items with * indicate a potential emergency and should be followed up as soon as possible or go to the Emergency Department if any problems should occur.  Please show the CHEMOTHERAPY ALERT CARD or IMMUNOTHERAPY ALERT CARD at check-in to the Emergency Department and triage nurse.  Should you have questions after your visit or need to cancel or reschedule your appointment, please contact [facility] [phone]  and follow the prompts.  Office hours are 8:00 a.m. to 4:30 p.m. Monday - Friday. Please note that voicemails left after 4:00 p.m. may not be returned until the following business day.  We are closed weekends and major holidays. You have access to a nurse at all times for urgent questions. Please call the main number to the clinic [phone] and follow the prompts.  For any non-urgent questions, you may also contact your provider using MyChart. We now offer e-Visits for anyone 37 and older to request care online for non-urgent symptoms. For details visit [email or web address].   Also download the MyChart app! Go to the app store, search "MyChart", open the app, select [facility], and log in with your MyChart username and password.  Masks are optional in the cancer centers. If you would like for your care team to wear a mask while they are taking care of you, please let them know. You may have one support person who is at least 68 years old accompany you for your appointments.

## 2021-11-03 NOTE — ED Notes (Signed)
Patient transported to CT 

## 2021-11-03 NOTE — ED Notes (Signed)
RN attempted to titrate Pt to Room Air. Pts O2 Sats dropped to 87% RA. Pt returned to having 2L Nasal Cannula. O2 Sats improved to 96%.

## 2021-11-03 NOTE — ED Triage Notes (Signed)
Pt in c/o dysphagia onset this am, pt reports last chemo x 2 days ago, pt states, "It feels like her legs are drawing." Pt A&O x4, pt has Lung Ca and her oncologist is at [facility]

## 2021-11-03 NOTE — ED Notes (Signed)
Pt provided 4oz of OJ PO.

## 2021-11-03 NOTE — Progress Notes (Signed)
Brittney Martinez presents today for injection per the provider's orders.  Neulasta 6 mg administration without incident; injection site WNL; see MAR for injection details.  Patient tolerated procedure well and without incident.  No questions or complaints noted at this time.   Discharged from clinic via wheelchair in stable condition. Alert and oriented x 3. F/U with [facility] as scheduled.

## 2021-11-04 ENCOUNTER — Observation Stay (HOSPITAL_COMMUNITY): Payer: Medicare Other

## 2021-11-04 DIAGNOSIS — R1310 Dysphagia, unspecified: Secondary | ICD-10-CM | POA: Diagnosis not present

## 2021-11-04 DIAGNOSIS — B370 Candidal stomatitis: Secondary | ICD-10-CM | POA: Diagnosis not present

## 2021-11-04 DIAGNOSIS — K219 Gastro-esophageal reflux disease without esophagitis: Secondary | ICD-10-CM | POA: Diagnosis not present

## 2021-11-04 DIAGNOSIS — J189 Pneumonia, unspecified organism: Secondary | ICD-10-CM

## 2021-11-04 DIAGNOSIS — M109 Gout, unspecified: Secondary | ICD-10-CM | POA: Diagnosis present

## 2021-11-04 DIAGNOSIS — E785 Hyperlipidemia, unspecified: Secondary | ICD-10-CM | POA: Diagnosis not present

## 2021-11-04 DIAGNOSIS — Z841 Family history of disorders of kidney and ureter: Secondary | ICD-10-CM | POA: Diagnosis not present

## 2021-11-04 DIAGNOSIS — Z7902 Long term (current) use of antithrombotics/antiplatelets: Secondary | ICD-10-CM | POA: Diagnosis not present

## 2021-11-04 DIAGNOSIS — C787 Secondary malignant neoplasm of liver and intrahepatic bile duct: Secondary | ICD-10-CM | POA: Diagnosis present

## 2021-11-04 DIAGNOSIS — J449 Chronic obstructive pulmonary disease, unspecified: Secondary | ICD-10-CM

## 2021-11-04 DIAGNOSIS — J9601 Acute respiratory failure with hypoxia: Secondary | ICD-10-CM | POA: Diagnosis not present

## 2021-11-04 DIAGNOSIS — J440 Chronic obstructive pulmonary disease with acute lower respiratory infection: Secondary | ICD-10-CM | POA: Diagnosis present

## 2021-11-04 DIAGNOSIS — I10 Essential (primary) hypertension: Secondary | ICD-10-CM

## 2021-11-04 DIAGNOSIS — Z20822 Contact with and (suspected) exposure to covid-19: Secondary | ICD-10-CM | POA: Diagnosis present

## 2021-11-04 DIAGNOSIS — J029 Acute pharyngitis, unspecified: Secondary | ICD-10-CM | POA: Diagnosis present

## 2021-11-04 DIAGNOSIS — J690 Pneumonitis due to inhalation of food and vomit: Secondary | ICD-10-CM | POA: Diagnosis present

## 2021-11-04 DIAGNOSIS — Z8249 Family history of ischemic heart disease and other diseases of the circulatory system: Secondary | ICD-10-CM | POA: Diagnosis not present

## 2021-11-04 DIAGNOSIS — Z8673 Personal history of transient ischemic attack (TIA), and cerebral infarction without residual deficits: Secondary | ICD-10-CM | POA: Diagnosis not present

## 2021-11-04 DIAGNOSIS — Z79899 Other long term (current) drug therapy: Secondary | ICD-10-CM | POA: Diagnosis not present

## 2021-11-04 DIAGNOSIS — J441 Chronic obstructive pulmonary disease with (acute) exacerbation: Secondary | ICD-10-CM | POA: Diagnosis present

## 2021-11-04 DIAGNOSIS — Z006 Encounter for examination for normal comparison and control in clinical research program: Secondary | ICD-10-CM | POA: Diagnosis not present

## 2021-11-04 DIAGNOSIS — Z95828 Presence of other vascular implants and grafts: Secondary | ICD-10-CM | POA: Diagnosis not present

## 2021-11-04 DIAGNOSIS — Z7951 Long term (current) use of inhaled steroids: Secondary | ICD-10-CM | POA: Diagnosis not present

## 2021-11-04 DIAGNOSIS — E119 Type 2 diabetes mellitus without complications: Secondary | ICD-10-CM | POA: Diagnosis present

## 2021-11-04 DIAGNOSIS — M069 Rheumatoid arthritis, unspecified: Secondary | ICD-10-CM | POA: Diagnosis present

## 2021-11-04 DIAGNOSIS — H409 Unspecified glaucoma: Secondary | ICD-10-CM | POA: Diagnosis present

## 2021-11-04 LAB — CBC WITH DIFFERENTIAL/PLATELET
Abs Immature Granulocytes: 0.54 10*3/uL — ABNORMAL HIGH (ref 0.00–0.07)
Basophils Absolute: 0 10*3/uL (ref 0.0–0.1)
Basophils Relative: 0 %
Eosinophils Absolute: 0.1 10*3/uL (ref 0.0–0.5)
Eosinophils Relative: 0 %
HCT: 37 % (ref 36.0–46.0)
Hemoglobin: 11.4 g/dL — ABNORMAL LOW (ref 12.0–15.0)
Immature Granulocytes: 3 %
Lymphocytes Relative: 4 %
Lymphs Abs: 0.7 10*3/uL (ref 0.7–4.0)
MCH: 26.2 pg (ref 26.0–34.0)
MCHC: 30.8 g/dL (ref 30.0–36.0)
MCV: 85.1 fL (ref 80.0–100.0)
Monocytes Absolute: 0.1 10*3/uL (ref 0.1–1.0)
Monocytes Relative: 0 %
Neutro Abs: 17.3 10*3/uL — ABNORMAL HIGH (ref 1.7–7.7)
Neutrophils Relative %: 93 %
Platelets: 305 10*3/uL (ref 150–400)
RBC: 4.35 MIL/uL (ref 3.87–5.11)
RDW: 19.4 % — ABNORMAL HIGH (ref 11.5–15.5)
WBC: 18.7 10*3/uL — ABNORMAL HIGH (ref 4.0–10.5)
nRBC: 0 % (ref 0.0–0.2)

## 2021-11-04 LAB — COMPREHENSIVE METABOLIC PANEL
ALT: 22 U/L (ref 0–44)
AST: 24 U/L (ref 15–41)
Albumin: 2.6 g/dL — ABNORMAL LOW (ref 3.5–5.0)
Alkaline Phosphatase: 107 U/L (ref 38–126)
Anion gap: 8 (ref 5–15)
BUN: 14 mg/dL (ref 8–23)
CO2: 25 mmol/L (ref 22–32)
Calcium: 9 mg/dL (ref 8.9–10.3)
Chloride: 104 mmol/L (ref 98–111)
Creatinine, Ser: 0.61 mg/dL (ref 0.44–1.00)
GFR, Estimated: >60 mL/min (ref >60)
Glucose, Bld: 261 mg/dL — ABNORMAL HIGH (ref 70–99)
Potassium: 4.4 mmol/L (ref 3.5–5.1)
Sodium: 137 mmol/L (ref 135–145)
Total Bilirubin: 0.7 mg/dL (ref 0.3–1.2)
Total Protein: 7.4 g/dL (ref 6.5–8.1)

## 2021-11-04 LAB — HIV ANTIBODY (ROUTINE TESTING W REFLEX): HIV Screen 4th Generation wRfx: NONREACTIVE

## 2021-11-04 LAB — STREP PNEUMONIAE URINARY ANTIGEN: Strep Pneumo Urinary Antigen: NEGATIVE

## 2021-11-04 LAB — GLUCOSE, RANDOM
Glucose, Bld: 461 mg/dL — ABNORMAL HIGH (ref 70–99)
Glucose, Bld: 414 mg/dL — ABNORMAL HIGH (ref 70–99)

## 2021-11-04 LAB — GLUCOSE, CAPILLARY
Glucose-Capillary: 386 mg/dL — ABNORMAL HIGH (ref 70–99)
Glucose-Capillary: 498 mg/dL — ABNORMAL HIGH (ref 70–99)
Glucose-Capillary: 449 mg/dL — ABNORMAL HIGH (ref 70–99)

## 2021-11-04 LAB — MAGNESIUM: Magnesium: 1.9 mg/dL (ref 1.7–2.4)

## 2021-11-04 LAB — HEMOGLOBIN A1C
Hgb A1c MFr Bld: 9 % — ABNORMAL HIGH (ref 4.8–5.6)
Mean Plasma Glucose: 211.6 mg/dL

## 2021-11-04 LAB — SARS CORONAVIRUS 2 BY RT PCR: SARS Coronavirus 2 by RT PCR: NEGATIVE

## 2021-11-04 LAB — PROCALCITONIN: Procalcitonin: <0.1 ng/mL

## 2021-11-04 MED ORDER — INSULIN ASPART 100 UNIT/ML IJ SOLN
0.0000 [IU] | Freq: Every day | INTRAMUSCULAR | Status: DC
  Administered 2021-11-04: 5 [IU] via SUBCUTANEOUS

## 2021-11-04 MED ORDER — MOMETASONE FURO-FORMOTEROL FUM 200-5 MCG/ACT IN AERO
2.0000 | INHALATION_SPRAY | Freq: Two times a day (BID) | RESPIRATORY_TRACT | Status: DC
  Administered 2021-11-04 – 2021-11-05 (×3): 2 via RESPIRATORY_TRACT
  Filled 2021-11-04: qty 8.8

## 2021-11-04 MED ORDER — MAGIC MOUTHWASH W/LIDOCAINE
15.0000 mL | Freq: Three times a day (TID) | ORAL | Status: DC | PRN
Start: 2021-11-04 — End: 2021-11-05
  Administered 2021-11-04 – 2021-11-05 (×3): 15 mL via ORAL
  Filled 2021-11-04 (×2): qty 15

## 2021-11-04 MED ORDER — HEPARIN SODIUM (PORCINE) 5000 UNIT/ML IJ SOLN
5000.0000 [IU] | Freq: Three times a day (TID) | INTRAMUSCULAR | Status: DC
  Administered 2021-11-04 – 2021-11-05 (×4): 5000 [IU] via SUBCUTANEOUS
  Filled 2021-11-04 (×4): qty 1

## 2021-11-04 MED ORDER — CLOPIDOGREL BISULFATE 75 MG PO TABS
75.0000 mg | ORAL_TABLET | Freq: Every day | ORAL | Status: DC
  Administered 2021-11-04 – 2021-11-05 (×2): 75 mg via ORAL
  Filled 2021-11-04 (×2): qty 1

## 2021-11-04 MED ORDER — INSULIN DETEMIR 100 UNIT/ML ~~LOC~~ SOLN
8.0000 [IU] | Freq: Every day | SUBCUTANEOUS | Status: DC
  Administered 2021-11-04: 8 [IU] via SUBCUTANEOUS
  Filled 2021-11-04 (×2): qty 0.08

## 2021-11-04 MED ORDER — ATORVASTATIN CALCIUM 20 MG PO TABS
20.0000 mg | ORAL_TABLET | Freq: Every day | ORAL | Status: DC
  Administered 2021-11-04: 20 mg via ORAL
  Filled 2021-11-04: qty 1

## 2021-11-04 MED ORDER — DOXEPIN HCL 25 MG PO CAPS
75.0000 mg | ORAL_CAPSULE | Freq: Every day | ORAL | Status: DC
  Administered 2021-11-04: 75 mg via ORAL
  Filled 2021-11-04 (×2): qty 1
  Filled 2021-11-04: qty 3

## 2021-11-04 MED ORDER — DM-GUAIFENESIN ER 30-600 MG PO TB12
1.0000 | ORAL_TABLET | Freq: Two times a day (BID) | ORAL | Status: DC
  Administered 2021-11-04 – 2021-11-05 (×3): 1 via ORAL
  Filled 2021-11-04 (×3): qty 1

## 2021-11-04 MED ORDER — PANTOPRAZOLE SODIUM 40 MG PO TBEC
40.0000 mg | DELAYED_RELEASE_TABLET | Freq: Every day | ORAL | Status: DC
  Administered 2021-11-04 – 2021-11-05 (×2): 40 mg via ORAL
  Filled 2021-11-04 (×2): qty 1

## 2021-11-04 MED ORDER — METHYLPREDNISOLONE SODIUM SUCC 125 MG IJ SOLR
125.0000 mg | Freq: Once | INTRAMUSCULAR | Status: AC
  Administered 2021-11-04: 125 mg via INTRAVENOUS
  Filled 2021-11-04: qty 2

## 2021-11-04 MED ORDER — SODIUM CHLORIDE 0.9 % IV SOLN
500.0000 mg | INTRAVENOUS | Status: DC
  Administered 2021-11-04 – 2021-11-05 (×2): 500 mg via INTRAVENOUS
  Filled 2021-11-04 (×2): qty 5

## 2021-11-04 MED ORDER — GABAPENTIN 300 MG PO CAPS
600.0000 mg | ORAL_CAPSULE | Freq: Three times a day (TID) | ORAL | Status: DC
  Administered 2021-11-04 – 2021-11-05 (×5): 600 mg via ORAL
  Filled 2021-11-04 (×5): qty 2

## 2021-11-04 MED ORDER — INSULIN ASPART 100 UNIT/ML IJ SOLN
0.0000 [IU] | Freq: Three times a day (TID) | INTRAMUSCULAR | Status: DC
  Administered 2021-11-05 (×2): 8 [IU] via SUBCUTANEOUS
  Administered 2021-11-05: 15 [IU] via SUBCUTANEOUS

## 2021-11-04 MED ORDER — SODIUM CHLORIDE 0.9 % IV SOLN
2.0000 g | INTRAVENOUS | Status: DC
  Administered 2021-11-05: 2 g via INTRAVENOUS
  Filled 2021-11-04: qty 20

## 2021-11-04 MED ORDER — INSULIN ASPART 100 UNIT/ML IJ SOLN
20.0000 [IU] | Freq: Once | INTRAMUSCULAR | Status: AC
  Administered 2021-11-04: 20 [IU] via SUBCUTANEOUS

## 2021-11-04 MED ORDER — NICOTINE 21 MG/24HR TD PT24
21.0000 mg | MEDICATED_PATCH | Freq: Every day | TRANSDERMAL | Status: DC
  Administered 2021-11-04 – 2021-11-05 (×2): 21 mg via TRANSDERMAL
  Filled 2021-11-04 (×2): qty 1

## 2021-11-04 MED ORDER — METOPROLOL SUCCINATE ER 50 MG PO TB24
100.0000 mg | ORAL_TABLET | Freq: Every morning | ORAL | Status: DC
  Administered 2021-11-04 – 2021-11-05 (×2): 100 mg via ORAL
  Filled 2021-11-04 (×2): qty 2

## 2021-11-04 MED ORDER — SODIUM CHLORIDE 0.9 % IV SOLN
1.0000 g | Freq: Once | INTRAVENOUS | Status: AC
  Administered 2021-11-04: 1 g via INTRAVENOUS
  Filled 2021-11-04: qty 10

## 2021-11-04 MED ORDER — METHYLPREDNISOLONE SODIUM SUCC 125 MG IJ SOLR
125.0000 mg | Freq: Every day | INTRAMUSCULAR | Status: DC
  Administered 2021-11-04: 125 mg via INTRAVENOUS
  Filled 2021-11-04: qty 2

## 2021-11-04 MED ORDER — BENZONATATE 100 MG PO CAPS
200.0000 mg | ORAL_CAPSULE | Freq: Three times a day (TID) | ORAL | Status: DC | PRN
  Administered 2021-11-04: 200 mg via ORAL
  Filled 2021-11-04: qty 2

## 2021-11-04 MED ORDER — ASPIRIN 81 MG PO CHEW
81.0000 mg | CHEWABLE_TABLET | Freq: Every day | ORAL | Status: DC
  Administered 2021-11-04 – 2021-11-05 (×2): 81 mg via ORAL
  Filled 2021-11-04 (×2): qty 1

## 2021-11-04 MED ORDER — IPRATROPIUM-ALBUTEROL 0.5-2.5 (3) MG/3ML IN SOLN
3.0000 mL | Freq: Once | RESPIRATORY_TRACT | Status: AC
  Administered 2021-11-04: 3 mL via RESPIRATORY_TRACT
  Filled 2021-11-04: qty 3

## 2021-11-04 MED ORDER — METHYLPREDNISOLONE SODIUM SUCC 40 MG IJ SOLR
40.0000 mg | Freq: Two times a day (BID) | INTRAMUSCULAR | Status: DC
  Administered 2021-11-04 – 2021-11-05 (×2): 40 mg via INTRAVENOUS
  Filled 2021-11-04 (×2): qty 1

## 2021-11-04 MED ORDER — IPRATROPIUM-ALBUTEROL 0.5-2.5 (3) MG/3ML IN SOLN
3.0000 mL | Freq: Four times a day (QID) | RESPIRATORY_TRACT | Status: DC
  Administered 2021-11-04 – 2021-11-05 (×7): 3 mL via RESPIRATORY_TRACT
  Filled 2021-11-04 (×7): qty 3

## 2021-11-04 MED ORDER — ALBUTEROL SULFATE (2.5 MG/3ML) 0.083% IN NEBU
2.5000 mg | INHALATION_SOLUTION | RESPIRATORY_TRACT | Status: DC | PRN
Start: 2021-11-04 — End: 2021-11-05

## 2021-11-04 NOTE — Assessment & Plan Note (Signed)
Continue Protonix °

## 2021-11-04 NOTE — H&P (Signed)
History and Physical    Patient: Brittney Martinez OEU:[id number] DOB: March 08, 1955 DOA: 11/03/2021 DOS: the patient was seen and examined on 11/04/2021 PCP: Glenda Chroman, MD  Patient coming from: Home  Chief Complaint:  Chief Complaint  Patient presents with   Dysphagia   HPI: Brittney Martinez is a 66 y.o. female with medical history significant of COPD, GERD, hypertension, rheumatoid arthritis, stroke, IBS, metastatic squamous cell lung cancer to the liver, and more presents the ED with a chief complaint of sudden onset throat pain.  Patient noticed a dull, aching pain in her throat that came on yesterday.  Patient reports that its been worse since it started.  It is worse when she swallows, and worse with cough.  Patient is also had some dyspnea on exertion.  She sometimes has orthopnea but has not noticed any in the past couple of days.  She denies fever.  Patient has a cough it is nonproductive.  Patient reports that she has sores in her mouth that she gets Magic mouthwash for, that are related to chemo.  Chemo was this week.  She had a shot of Neulasta yesterday.  Patient reports that since starting chemo she occasionally has nausea and she has a poor appetite.  When her nausea is under control she can eat.  Given Magic mouthwash in the ER today, her pain in her throat is relieved.  She did have a CT neck that showed reactive lymphadenopathy and upper lobe pneumonia.  Patient was given Rocephin, Tylenol, DuoNeb, Magic mouthwash, Solu-Medrol in the ED.  Patient denies any other complaints at this time.  Patient does not smoke but she is on nicotine gum.  Nicotine patch ordered for while she is in the hospital.  Patient does not drink alcohol.  She is vaccinated for COVID.  Patient is full code, but would not want to be left on life support long-term. Review of Systems: As mentioned in the history of present illness. All other systems reviewed and are negative. Past Medical History:  Diagnosis Date    Abdominal aortic stenosis    Anal fissure    Anxiety    Asthma    Complication of anesthesia    COPD (chronic obstructive pulmonary disease) (HCC)    Depression    grief   Diabetes mellitus    Diverticulitis    GERD (gastroesophageal reflux disease)    Glaucoma    Gout    Hypertension    IBS (irritable bowel syndrome)    PONV (postoperative nausea and vomiting)    Port-A-Cath in place 09/19/2021   RA (rheumatoid arthritis) ([place])    Sleep apnea    had one at one time but not anymore   Stroke ([place])    "mini-stroke"- no deficits   TIA (transient ischemic attack)    2010. No deficits   Past Surgical History:  Procedure Laterality Date   ABDOMINAL HYSTERECTOMY  1982   complete   BIOPSY  11/17/2018   Procedure: BIOPSY;  Surgeon: Daneil Dolin, MD;  [facility];  Service: Endoscopy;;   BREAST LUMPECTOMY     right-benign   CATARACT EXTRACTION W/PHACO Right 05/13/2017   Procedure: CATARACT EXTRACTION PHACO AND INTRAOCULAR LENS PLACEMENT ([place]);  Surgeon: Tonny Branch, MD;  Location: [facility];  Service: Ophthalmology;  Laterality: Right;  CDE: 8.59   CATARACT EXTRACTION W/PHACO Left 05/27/2017   Procedure: CATARACT EXTRACTION WITH  PHACOEMULSIFICATION AND INTRAOCULAR LENS PLACEMENT LEFT EYE;  Surgeon: Tonny Branch, MD;  Location:  [facility];  Service: Ophthalmology;  Laterality: Left;  CDE: 5.66   CHOLECYSTECTOMY  2003   COLONOSCOPY  12/14/09   Dr. Gala Romney :anal papilla and hemorrhoids,diminutive hperplastic rectal polyps/normal colon   COLONOSCOPY N/A 10/29/2013   CXK:GYJE papilla and internal hemorrhoids; colonic polyps-removed as described above. I suspect benign anorectal bleeding in the setting of hemorrhoids and  possibly fissure. tubular adenoma. next TCS 10/2020.   COLONOSCOPY WITH PROPOFOL N/A 11/17/2018   Dr. Gala Romney: Nonbleeding internal hemorrhoids, hemorrhoids were moderate and grade.  Exam otherwise normal.  Colonoscopy in 5 years.   ELBOW SURGERY     right    ESOPHAGOGASTRODUODENOSCOPY  12/14/09   Dr. Gala Romney :schatzkis ring 44F, otherwise normal   ESOPHAGOGASTRODUODENOSCOPY N/A 10/29/2013   RMR: Distal esophageal pseudodiverticulum/Nissen fundoplication   ESOPHAGOGASTRODUODENOSCOPY (EGD) WITH PROPOFOL N/A 11/17/2018   Dr. Gala Romney: Normal esophagus status post dilation.  Nonbleeding erosive gastropathy with benign biopsy.   FINGER SURGERY     on right middle, pointer, and index fingers   FISSURECTOMY     several   INCISIONAL HERNIA REPAIR N/A 03/22/2014   Procedure: [name] HERNIORRHAPHY;  Surgeon: Jamesetta So, MD;  Location: [facility];  Service: General;  Laterality: N/A;   INSERTION OF MESH N/A 03/22/2014   Procedure: INSERTION OF MESH;  Surgeon: Jamesetta So, MD;  Location: [facility];  Service: General;  Laterality: N/A;   MALONEY DILATION N/A 11/17/2018   Procedure: [name] DILATION;  Surgeon: Daneil Dolin, MD;  Location[facility];  Service: Endoscopy;  Laterality: N/A;   NISSEN FUNDOPLICATION  [id number]   [place]   PORTACATH PLACEMENT Left 07/12/2021   Procedure: INSERTION PORT-A-CATH;  Surgeon: Virl Cagey, MD;  Location: [facility];  Service: General;  Laterality: Left;   WRIST SURGERY     left   Social History:  reports that she quit smoking about 14 years ago. Her smoking use included cigarettes. She has a 4.75 pack-year smoking history. She has never used smokeless tobacco. She reports that she does not drink alcohol and does not use drugs.  Allergies  Allergen Reactions   Lisinopril Anaphylaxis    Closing of throat   Aspirin Nausea Only   Propoxyphene N-Acetaminophen Nausea And Vomiting   Zithromax [Azithromycin] Nausea Only   Erythromycin Rash    Family History  Problem Relation Age of Onset   Colon cancer Father 27   Hypertension Sister    Hypertension Brother    Kidney disease Brother     Prior to Admission medications   Medication Sig Start Date End Date Taking? Authorizing Provider  albuterol (PROVENTIL  HFA;VENTOLIN HFA) 108 (90 BASE) MCG/ACT inhaler Inhale 2 puffs into the lungs every 6 (six) hours as needed for wheezing or shortness of breath.   Yes [provider]  albuterol (PROVENTIL) (2.5 MG/3ML) 0.083% nebulizer solution Take 2.5 mg by nebulization every 6 (six) hours as needed for wheezing or shortness of breath.   Yes [provider]  allopurinol (ZYLOPRIM) 300 MG tablet Take 300 mg by mouth daily.   Yes [provider]  aspirin 81 MG tablet Take 81 mg by mouth daily.   Yes [provider]  atorvastatin (LIPITOR) 20 MG tablet Take 20 mg by mouth at bedtime.   Yes [provider]  benzonatate (TESSALON) 200 MG capsule Take 1 capsule (200 mg total) by mouth every 8 (eight) hours as needed. 10/11/21  Yes Derek Jack, MD  budesonide-formoterol [facility]) 160-4.5 MCG/ACT inhaler Inhale 2 puffs into  the lungs in the morning and at bedtime.   Yes [provider]  CARBOPLATIN IV Inject into the vein every 21 ( twenty-one) days. 09/20/21  Yes [provider]  clopidogrel (PLAVIX) 75 MG tablet Take 75 mg by mouth daily.   Yes [provider]  cycloSPORINE (RESTASIS) 0.05 % ophthalmic emulsion Place 1 drop into both eyes 2 (two) times daily.   Yes [provider]  diphenhydrAMINE (BENADRYL) 25 MG tablet Take 25 mg by mouth daily as needed for allergies.   Yes [provider]  doxepin (SINEQUAN) 75 MG capsule Take 75 mg by mouth at bedtime.   Yes [provider]  esomeprazole (NEXIUM) 40 MG capsule Take 40 mg by mouth in the morning and at bedtime.   Yes [provider]  gabapentin (NEURONTIN) 300 MG capsule Take 2 capsules (600 mg total) by mouth 3 (three) times daily. If 1 capsule 3 times daily is not helping, you can increase to 2 capsules 3 times daily. 10/19/21  Yes Derek Jack, MD  hyoscyamine (LEVSIN SL) 0.125 MG SL tablet Place 1 tablet (0.125 mg total) under the tongue every  6 (six) hours as needed for cramping. 06/26/21  Yes Erenest Rasher, PA-C  ibuprofen (ADVIL) 200 MG tablet Take 400 mg by mouth every 6 (six) hours as needed for moderate pain.   Yes [provider]  loperamide (IMODIUM) 2 MG capsule Take 1 capsule (2 mg total) by mouth as needed for diarrhea or loose stools. Take 2 capsules after the first loose stool of the day.  For each subsequent loose stool take 1 capsule.  Do not take more than 8 capsules in a day. 09/25/21  Yes Pennington, Rebekah M, PA-C  magnesium oxide (MAG-OX) 400 (240 Mg) MG tablet Take 1 tablet (400 mg total) by mouth 2 (two) times daily. 11/01/21  Yes Derek Jack, MD  MELATONIN PO Take 1 capsule by mouth at bedtime as needed (sleep).   Yes [provider]  methocarbamol (ROBAXIN) 500 MG tablet Take 500 mg by mouth every 6 (six) hours as needed for muscle spasms. 06/14/21  Yes [provider]  metoprolol (TOPROL-XL) 100 MG 24 hr tablet Take 100 mg by mouth every morning.   Yes [provider]  nystatin (MYCOSTATIN) 100000 UNIT/ML suspension Take 5 mLs (500,000 Units total) by mouth 4 (four) times daily as needed. SWISH AND SWALLOW 10/19/21  Yes Derek Jack, MD  ondansetron (ZOFRAN) 8 MG tablet Take 1 tablet (8 mg total) by mouth every 8 (eight) hours as needed for refractory nausea / vomiting. 09/25/21  Yes Pennington, Rebekah M, PA-C  PACLITAXEL IV Inject into the vein every 21 ( twenty-one) days. 09/20/21  Yes [provider]  traMADol (ULTRAM) 50 MG tablet Take 50-100 mg by mouth every 6 (six) hours as needed for moderate pain.   Yes [provider]  B-D UF III MINI PEN NEEDLES 31G X 5 MM MISC Inject into the skin 3 (three) times daily. 08/04/21   [provider]  HYDROcodone bit-homatropine (HYCODAN) 5-1.5 MG/5ML syrup Take 5 mLs by mouth every 6 (six) hours as needed. Patient not taking: Reported on 11/03/2021 10/03/21   [provider]  hydrOXYzine  (ATARAX) 25 MG tablet Take 1 tablet (25 mg total) by mouth every 6 (six) hours as needed. Patient not taking: Reported on 11/03/2021 10/11/21   Derek Jack, MD  Lancets (ONETOUCH DELICA PLUS ENIDPO24M) [place] Apply topically. 08/17/21   [provider]  levofloxacin (LEVAQUIN) 750 MG tablet Take 750 mg by mouth daily. Patient not taking: Reported on 11/03/2021 08/22/21   [provider]  lubiprostone (AMITIZA) 8 MCG capsule TAKE 1 CAPSULE BY MOUTH  ONCE DAILY WITH BREAKFAST Patient taking differently: Take 8 mcg by mouth daily as needed for constipation. 08/27/19   Erenest Rasher, PA-C  ONETOUCH ULTRA test strip  08/17/21   [provider]  predniSONE (DELTASONE) 10 MG tablet Take 10 mg by mouth daily. Patient not taking: Reported on 11/03/2021    [provider]  predniSONE (DELTASONE) 5 MG tablet Take 5 mg by mouth daily. Patient not taking: Reported on 11/03/2021 09/16/21   [provider]  zolpidem (AMBIEN) 5 MG tablet Take 5 mg by mouth at bedtime as needed. Patient not taking: Reported on 11/03/2021 09/16/21   [provider]  prochlorperazine (COMPAZINE) 10 MG tablet Take 1 tablet (10 mg total) by mouth every 6 (six) hours as needed (Nausea or vomiting). 09/19/21 10/31/21  Derek Jack, MD    Physical Exam: Vitals:   11/04/21 0100 11/04/21 0120 11/04/21 0130 11/04/21 0343  BP: (!) 86/62 101/67 98/64   Pulse: 83 86 83   Resp: 18 18 18    Temp: 97.6 F (36.4 C)     TempSrc: Oral     SpO2: 92% 94% 94% 96%  Height:       1.  General: Patient lying supine in bed,  no acute distress   2. Psychiatric: Alert and oriented x 3, mood and behavior normal for situation, pleasant and cooperative with exam   3. Neurologic: Speech and language are normal, face is symmetric, moves all 4 extremities voluntarily, at baseline without acute deficits on limited exam   4. HEENMT:  Head is atraumatic, normocephalic, pupils reactive to light,  neck is supple, trachea is midline, mucous membranes are moist   5. Respiratory : Rhonchi bilaterally with no rales, no cyanosis, no increase in work of breathing or accessory muscle use, resting comfortably on 2 L nasal cannula   6. Cardiovascular : Heart rate normal, rhythm is regular, no murmurs, rubs or gallops, no peripheral edema, peripheral pulses palpated   7. Gastrointestinal:  Abdomen is soft, nondistended, nontender to palpation bowel sounds active, no masses or organomegaly palpated   8. Skin:  Skin is warm, dry and intact without rashes, acute lesions, or ulcers on limited exam   9.Musculoskeletal:  No acute deformities or trauma, no asymmetry in tone, no peripheral edema, peripheral pulses palpated, no tenderness to palpation in the extremities  Data Reviewed: In the ED - Temp 97, heart rate 77-86, respiratory rate 15-19, blood pressure 86/62-141/88 satting 94% on 2 L nasal cannula - Leukocytosis 15.6, hemoglobin 10.7 - Chemistry is unremarkable - Albumin 2.8, likely in the setting of poor p.o. intake secondary to chemo/nausea - CT scan shows nodal metastatic disease with reactive lymphadenopathy.  No abscess.  Patchy irregular opacities within the partially visualized left upper lobe suspicious for pneumonia - Patient was given Rocephin, Tylenol, DuoNeb, Magic mouthwash, Solu-Medrol 125 mg IV - Admission requested for further management of community-acquired pneumonia in an immunocompromise patient  Assessment and Plan: * CAP (community acquired pneumonia) - Leukocytosis at 15.6 (in the setting of just having Neulasta yesterday) - Cough, respiratory failure - Pneumonia finding on CT scan - Rocephin started in the ED, continue Rocephin and start Zithromax - Sputum culture, blood culture pending - Continue breathing treatments - Continue to monitor  COPD (chronic obstructive pulmonary disease) (  [place]) - Scheduled DuoNebs, as needed albuterol - Continue Solu-Medrol -  No wheeze on exam, but patient is rhonchorous - Clinical presentation is not suspicious for hypercapnia - Continue Dulera - Continue to monitor  HLD (hyperlipidemia) - Continue statin  HTN (hypertension) - Continue metoprolol  Acute respiratory failure with hypoxia (HCC) - Secondary to pneumonia - Chest x-ray pending - CT neck showed upper lobe pneumonia - Patient was started on Rocephin in the ED - Continue Rocephin and start Zithromax - DuoNeb scheduled, albuterol as needed - Wean off O2 as tolerated - Continue to monitor  Gastroesophageal reflux disease - Continue Protonix      Advance Care Planning:   Code Status: Full Code   Consults: None  Family Communication: No family at bedside  Severity of Illness: The appropriate patient status for this patient is OBSERVATION. Observation status is judged to be reasonable and necessary in order to provide the required intensity of service to ensure the patient's safety. The patient's presenting symptoms, physical exam findings, and initial radiographic and laboratory data in the context of their medical condition is felt to place them at decreased risk for further clinical deterioration. Furthermore, it is anticipated that the patient will be medically stable for discharge from the hospital within 2 midnights of admission.   Author: Rolla Plate, DO 11/04/2021 3:46 AM  For on call review [email or web address].

## 2021-11-04 NOTE — ED Notes (Signed)
Hospitalist at bedside 

## 2021-11-04 NOTE — Plan of Care (Signed)

## 2021-11-04 NOTE — Assessment & Plan Note (Signed)
Continue statin 

## 2021-11-04 NOTE — Progress Notes (Signed)
Pt remained at 94% while ambulating in the hall way. Pt did need to rest several times while ambulating about 17ft.

## 2021-11-04 NOTE — Progress Notes (Signed)
Patient seen and examined; admitted after midnight secondary to acute respiratory failure with hypoxia in the setting of pneumonia.  Patient with underlying history of lung cancer actively receiving chemotherapy, increasing with these her risk for infection.  Patient reports symptom has been present for the last 3 to 4 days and worsening.  No chest pain, no nausea vomiting.  Patient with history of underlying COPD. Patient expressed some difficulty swallowing on 11/01/21 for which she had CT scan of the chest to further assess dysphagia; reactive lymphadenopathy and upper lobe pneumonia were found.  Patient oxygen saturation in the 87-88% on room air; requiring 2 L nasal cannula supplementation currently.  Please refer to H&P written by Dr. Clearence Ped for further info/details on admission.  Plan: -continue current antibiotics, steroids and bronchodilators management. -follow culture results and clinical response.  Barton Dubois MD [phone]

## 2021-11-04 NOTE — ED Provider Notes (Signed)
Patient with history of metastatic lung cancer on chemo and COPD here primary for dysphagia, also noted to be hypoxic at rest. She was had a CT which was neg for acute esophageal process, but question an upper lobe infiltrate. She has an increasing WBC but she also was given Neulasta yesterday for support after chemo treatment. She was given a neb and then a trial of room air but she dropped to 88% while at rest. Will restart oxygen, begin solumedrol and rocephin for possible CAP. Admit to hospitalist.    Truddie Hidden, MD 11/04/21 [phone]

## 2021-11-04 NOTE — ED Notes (Signed)
RT at bedside.

## 2021-11-04 NOTE — Assessment & Plan Note (Signed)
-   Leukocytosis at 15.6 (in the setting of just having Neulasta yesterday) - Cough, respiratory failure - Pneumonia finding on CT scan - Rocephin started in the ED, continue Rocephin and start Zithromax - Sputum culture, blood culture pending - Continue breathing treatments - Continue to monitor

## 2021-11-04 NOTE — Assessment & Plan Note (Signed)
-  Continue steroids tapering, bronchodilator management, oxygen supplementation with instructions to taper off as tolerated back to room air. -Continue treatment with antibiotic for underlying infection -Outpatient follow-up with pulmonology service.

## 2021-11-04 NOTE — ED Provider Notes (Signed)
[facility] EMERGENCY DEPARTMENT Provider Note  CSN: [id number] Arrival date & time: 11/03/21 1728  Chief Complaint(s) Dysphagia  HPI Brittney Martinez is a 66 y.o. female with history of COPD, lung cancer presenting to the emergency department for sore throat.  Patient reports sore throat and dysphagia for the past 2 days.  She has been able to tolerate water but reports some sore throat.  She also noticed some sores in her mouth.  She was given Magic mouthwash and has not tried it yet.  She denies any fevers or chills.  She denies any nausea or vomiting.  She denies any abdominal pain.  She reports hoarse voice.   Past Medical History Past Medical History:  Diagnosis Date   Abdominal aortic stenosis    Anal fissure    Anxiety    Asthma    Complication of anesthesia    COPD (chronic obstructive pulmonary disease) (HCC)    Depression    grief   Diabetes mellitus    Diverticulitis    GERD (gastroesophageal reflux disease)    Glaucoma    Gout    Hypertension    IBS (irritable bowel syndrome)    PONV (postoperative nausea and vomiting)    Port-A-Cath in place 09/19/2021   RA (rheumatoid arthritis) ([place])    Sleep apnea    had one at one time but not anymore   Stroke ([place])    "mini-stroke"- no deficits   TIA (transient ischemic attack)    2010. No deficits   Patient Active Problem List   Diagnosis Date Noted   Port-A-Cath in place 09/19/2021   Squamous cell lung cancer, unspecified laterality ([place]) 09/13/2021   Cancer, metastatic to liver ([place]) 07/06/2021   Cancer ([place]) 06/26/2021   Mediastinal adenopathy 06/06/2021   Liver masses 06/06/2021   Rheumatoid arthritis ([place]) 03/20/2018   Hypokalemia 03/20/2018   Type 2 diabetes mellitus without complication ([place]) 78/29/5621   COPD with acute bronchitis ([place]) 03/20/2018   Acute respiratory failure with hypoxia ([place]) 03/20/2018   HTN (hypertension) 03/20/2018   OSA on CPAP 03/20/2018   Influenza B 03/19/2018   Diarrhea  01/24/2016   Melena 11/01/2014   Incisional hernia 03/22/2014   Hemorrhoids 30/86/5784   Periumbilical hernia 69/62/9528   Fatty liver 02/05/2014   Anorectal fissure 11/30/2013   Irritable bowel syndrome 09/29/2013   Heme + stool 09/29/2013   Rectal pain 09/29/2013   Proctalgia 01/09/2011   Obesity 01/09/2011   Family hx of colon cancer 01/09/2011   Intertriginous candidiasis 01/09/2011   Gastroesophageal reflux disease 11/15/2009   Constipation 11/15/2009   RECTAL BLEEDING 11/15/2009   Dysphagia 11/15/2009   EPIGASTRIC PAIN 11/15/2009   Home Medication(s) Prior to Admission medications   Medication Sig Start Date End Date Taking? Authorizing Provider  albuterol (PROVENTIL HFA;VENTOLIN HFA) 108 (90 BASE) MCG/ACT inhaler Inhale 2 puffs into the lungs every 6 (six) hours as needed for wheezing or shortness of breath.   Yes [provider]  albuterol (PROVENTIL) (2.5 MG/3ML) 0.083% nebulizer solution Take 2.5 mg by nebulization every 6 (six) hours as needed for wheezing or shortness of breath.   Yes [provider]  allopurinol (ZYLOPRIM) 300 MG tablet Take 300 mg by mouth daily.   Yes [provider]  aspirin 81 MG tablet Take 81 mg by mouth daily.   Yes [provider]  atorvastatin (LIPITOR) 20 MG tablet Take 20 mg by mouth at bedtime.   Yes [provider]  benzonatate (TESSALON) 200 MG  capsule Take 1 capsule (200 mg total) by mouth every 8 (eight) hours as needed. 10/11/21  Yes Derek Jack, MD  budesonide-formoterol [facility]) 160-4.5 MCG/ACT inhaler Inhale 2 puffs into the lungs in the morning and at bedtime.   Yes [provider]  CARBOPLATIN IV Inject into the vein every 21 ( twenty-one) days. 09/20/21  Yes [provider]  clopidogrel (PLAVIX) 75 MG tablet Take 75 mg by mouth daily.   Yes [provider]  cycloSPORINE (RESTASIS) 0.05 % ophthalmic emulsion Place 1 drop into both eyes 2 (two) times  daily.   Yes [provider]  diphenhydrAMINE (BENADRYL) 25 MG tablet Take 25 mg by mouth daily as needed for allergies.   Yes [provider]  doxepin (SINEQUAN) 75 MG capsule Take 75 mg by mouth at bedtime.   Yes [provider]  esomeprazole (NEXIUM) 40 MG capsule Take 40 mg by mouth in the morning and at bedtime.   Yes [provider]  gabapentin (NEURONTIN) 300 MG capsule Take 2 capsules (600 mg total) by mouth 3 (three) times daily. If 1 capsule 3 times daily is not helping, you can increase to 2 capsules 3 times daily. 10/19/21  Yes Derek Jack, MD  hyoscyamine (LEVSIN SL) 0.125 MG SL tablet Place 1 tablet (0.125 mg total) under the tongue every 6 (six) hours as needed for cramping. 06/26/21  Yes Erenest Rasher, PA-C  ibuprofen (ADVIL) 200 MG tablet Take 400 mg by mouth every 6 (six) hours as needed for moderate pain.   Yes [provider]  loperamide (IMODIUM) 2 MG capsule Take 1 capsule (2 mg total) by mouth as needed for diarrhea or loose stools. Take 2 capsules after the first loose stool of the day.  For each subsequent loose stool take 1 capsule.  Do not take more than 8 capsules in a day. 09/25/21  Yes Pennington, Rebekah M, PA-C  magnesium oxide (MAG-OX) 400 (240 Mg) MG tablet Take 1 tablet (400 mg total) by mouth 2 (two) times daily. 11/01/21  Yes Derek Jack, MD  MELATONIN PO Take 1 capsule by mouth at bedtime as needed (sleep).   Yes [provider]  methocarbamol (ROBAXIN) 500 MG tablet Take 500 mg by mouth every 6 (six) hours as needed for muscle spasms. 06/14/21  Yes [provider]  metoprolol (TOPROL-XL) 100 MG 24 hr tablet Take 100 mg by mouth every morning.   Yes [provider]  nystatin (MYCOSTATIN) 100000 UNIT/ML suspension Take 5 mLs (500,000 Units total) by mouth 4 (four) times daily as needed. SWISH AND SWALLOW 10/19/21  Yes Derek Jack, MD  ondansetron (ZOFRAN) 8 MG tablet Take  1 tablet (8 mg total) by mouth every 8 (eight) hours as needed for refractory nausea / vomiting. 09/25/21  Yes Pennington, Rebekah M, PA-C  PACLITAXEL IV Inject into the vein every 21 ( twenty-one) days. 09/20/21  Yes [provider]  traMADol (ULTRAM) 50 MG tablet Take 50-100 mg by mouth every 6 (six) hours as needed for moderate pain.   Yes [provider]  B-D UF III MINI PEN NEEDLES 31G X 5 MM MISC Inject into the skin 3 (three) times daily. 08/04/21   [provider]  HYDROcodone bit-homatropine (HYCODAN) 5-1.5 MG/5ML syrup Take 5 mLs by mouth every 6 (six) hours as needed. Patient not taking: Reported on 11/03/2021 10/03/21   [provider]  hydrOXYzine (ATARAX) 25 MG tablet Take 1 tablet (25 mg total) by mouth every 6 (six)  hours as needed. Patient not taking: Reported on 11/03/2021 10/11/21   Derek Jack, MD  Lancets (ONETOUCH DELICA PLUS [id number]) [place] Apply topically. 08/17/21   [provider]  levofloxacin (LEVAQUIN) 750 MG tablet Take 750 mg by mouth daily. Patient not taking: Reported on 11/03/2021 08/22/21   [provider]  lubiprostone (AMITIZA) 8 MCG capsule TAKE 1 CAPSULE BY MOUTH  ONCE DAILY WITH BREAKFAST Patient taking differently: Take 8 mcg by mouth daily as needed for constipation. 08/27/19   Erenest Rasher, PA-C  ONETOUCH ULTRA test strip  08/17/21   [provider]  predniSONE (DELTASONE) 10 MG tablet Take 10 mg by mouth daily. Patient not taking: Reported on 11/03/2021    [provider]  predniSONE (DELTASONE) 5 MG tablet Take 5 mg by mouth daily. Patient not taking: Reported on 11/03/2021 09/16/21   [provider]  zolpidem (AMBIEN) 5 MG tablet Take 5 mg by mouth at bedtime as needed. Patient not taking: Reported on 11/03/2021 09/16/21   [provider]  prochlorperazine (COMPAZINE) 10 MG tablet Take 1 tablet (10 mg total) by mouth every 6 (six) hours as needed (Nausea or vomiting).  09/19/21 10/31/21  Derek Jack, MD                                                                                                                                    Past Surgical History Past Surgical History:  Procedure Laterality Date   ABDOMINAL HYSTERECTOMY  1982   complete   BIOPSY  11/17/2018   Procedure: BIOPSY;  Surgeon: Daneil Dolin, MD;  Location: [facility] ENDO SUITE;  Service: Endoscopy;;   BREAST LUMPECTOMY     right-benign   CATARACT EXTRACTION W/PHACO Right 05/13/2017   Procedure: CATARACT EXTRACTION PHACO AND INTRAOCULAR LENS PLACEMENT ([place]);  Surgeon: Tonny Branch, MD;  Location: [facility] ORS;  Service: Ophthalmology;  Laterality: Right;  CDE: 8.59   CATARACT EXTRACTION W/PHACO Left 05/27/2017   Procedure: CATARACT EXTRACTION WITH  PHACOEMULSIFICATION AND INTRAOCULAR LENS PLACEMENT LEFT EYE;  Surgeon: Tonny Branch, MD;  Location: [facility] ORS;  Service: Ophthalmology;  Laterality: Left;  CDE: 5.66   CHOLECYSTECTOMY  2003   COLONOSCOPY  12/14/09   Dr. Gala Romney :anal papilla and hemorrhoids,diminutive hperplastic rectal polyps/normal colon   COLONOSCOPY N/A 10/29/2013   DUK:GURK papilla and internal hemorrhoids; colonic polyps-removed as described above. I suspect benign anorectal bleeding in the setting of hemorrhoids and  possibly fissure. tubular adenoma. next TCS 10/2020.   COLONOSCOPY WITH PROPOFOL N/A 11/17/2018   Dr. Gala Romney: Nonbleeding internal hemorrhoids, hemorrhoids were moderate and grade.  Exam otherwise normal.  Colonoscopy in 5 years.   ELBOW SURGERY     right   ESOPHAGOGASTRODUODENOSCOPY  12/14/09   Dr. Gala Romney :schatzkis ring 58F, otherwise normal   ESOPHAGOGASTRODUODENOSCOPY N/A 10/29/2013   RMR: Distal esophageal pseudodiverticulum/Nissen fundoplication   ESOPHAGOGASTRODUODENOSCOPY (EGD) WITH PROPOFOL N/A 11/17/2018   Dr. Gala Romney: Normal esophagus status  post dilation.  Nonbleeding erosive gastropathy with benign biopsy.   FINGER SURGERY     on right middle, pointer, and index  fingers   FISSURECTOMY     several   INCISIONAL HERNIA REPAIR N/A 03/22/2014   Procedure: [name] HERNIORRHAPHY;  Surgeon: Jamesetta So, MD;  Location: [facility] ORS;  Service: General;  Laterality: N/A;   INSERTION OF MESH N/A 03/22/2014   Procedure: INSERTION OF MESH;  Surgeon: Jamesetta So, MD;  Location: [facility] ORS;  Service: General;  Laterality: N/A;   MALONEY DILATION N/A 11/17/2018   Procedure: [name] DILATION;  Surgeon: Daneil Dolin, MD;  Location: [facility] END[facility];  Service: Endoscopy;  Laterality: N/A;   NISSEN FUNDOPLICATION  [id number]   [place]   PORTACATH PLACEMENT Left 07/12/2021   Procedure: INSERTION PORT-A-CATH;  Surgeon: Virl Cagey, MD;  Location: [facility] ORS;  Service: General;  Laterality: Left;   WRIST SURGERY     left   Family History Family History  Problem Relation Age of Onset   Colon cancer Father 70   Hypertension Sister    Hypertension Brother    Kidney disease Brother     Social History Social History   Tobacco Use   Smoking status: Former    Packs/day: 0.25    Years: 19.00    Total pack years: 4.75    Types: Cigarettes    Quit date: 01/09/2007    Years since quitting: 14.8   Smokeless tobacco: Never  Vaping Use   Vaping Use: Never used  Substance Use Topics   Alcohol use: No   Drug use: No   Allergies Lisinopril, Aspirin, Propoxyphene n-acetaminophen, Zithromax [azithromycin], and Erythromycin  Review of Systems Review of Systems  All other systems reviewed and are negative.   Physical Exam Vital Signs  I have reviewed the triage vital signs BP 124/80   Pulse 79   Temp 97.6 F (36.4 C) (Oral)   Resp 18   Ht 5\' 1"  (1.549 m)   SpO2 98%   BMI 31.59 kg/m  Physical Exam Vitals and nursing note reviewed.  Constitutional:      General: She is not in acute distress.    Appearance: She is well-developed.  HENT:     Head: Normocephalic and atraumatic.     Mouth/Throat:     Mouth: Mucous membranes are moist.     Comments: Multiple  oral ulcerations consistent with mucositis.  Uvula midline.  No tonsillar enlargement or exudates.  Floor of mouth soft. Eyes:     Pupils: Pupils are equal, round, and reactive to light.  Cardiovascular:     Rate and Rhythm: Normal rate and regular rhythm.     Heart sounds: No murmur heard. Pulmonary:     Effort: Pulmonary effort is normal. No respiratory distress.     Breath sounds: Normal breath sounds.  Abdominal:     General: Abdomen is flat.     Palpations: Abdomen is soft.     Tenderness: There is no abdominal tenderness.  Musculoskeletal:        General: No tenderness.     Right lower leg: No edema.     Left lower leg: No edema.  Skin:    General: Skin is warm and dry.  Neurological:     General: No focal deficit present.     Mental Status: She is alert. Mental status is at baseline.  Psychiatric:        Mood and Affect: Mood normal.  Behavior: Behavior normal.     ED Results and Treatments Labs (all labs ordered are listed, but only abnormal results are displayed) Labs Reviewed  COMPREHENSIVE METABOLIC PANEL - Abnormal; Notable for the following components:      Result Value   Glucose, Bld 67 (*)    Albumin 2.8 (*)    All other components within normal limits  CBC WITH DIFFERENTIAL/PLATELET - Abnormal; Notable for the following components:   WBC 15.6 (*)    Hemoglobin 10.7 (*)    HCT 35.1 (*)    MCH 25.8 (*)    RDW 19.6 (*)    Neutro Abs 13.1 (*)    Abs Immature Granulocytes 0.13 (*)    All other components within normal limits  CBG MONITORING, ED                                                                                                                          Radiology No results found.  Pertinent labs & imaging results that were available during my care of the patient were reviewed by me and considered in my medical decision making (see MDM for details).  Medications Ordered in ED Medications  ipratropium-albuterol (DUONEB) 0.5-2.5 (3)  MG/3ML nebulizer solution 3 mL (has no administration in time range)  acetaminophen (TYLENOL) tablet 1,000 mg (1,000 mg Oral Given 11/03/21 2215)  magic mouthwash w/lidocaine (10 mLs Oral Given 11/03/21 2214)  iohexol (OMNIPAQUE) 300 MG/ML solution 75 mL (75 mLs Intravenous Contrast Given 11/03/21 2351)                                                                                                                                     Procedures Procedures  (including critical care time)  Medical Decision Making / ED Course   MDM:  66 year old female presenting to the emergency department with sore throat after chemotherapy.  Patient overall well-appearing, labs notable for moderately elevated white blood cell count.  This could be chemotherapy related.  No fevers in the emergency department.  Given sore throat and recent chemotherapy, will obtain CT scan to further evaluate for deep space neck infection.  Patient noted to be mildly hypoxic, likely related to chronic COPD, will order breathing treatment and reassess.  Signed out pending CT scan and reassessment.     Additional history obtained: -Additional history obtained from family -External records from outside source obtained and reviewed  including: Chart review including previous notes, labs, imaging, consultation notes   Lab Tests: -I ordered, reviewed, and interpreted labs.   The pertinent results include:   Labs Reviewed  COMPREHENSIVE METABOLIC PANEL - Abnormal; Notable for the following components:      Result Value   Glucose, Bld 67 (*)    Albumin 2.8 (*)    All other components within normal limits  CBC WITH DIFFERENTIAL/PLATELET - Abnormal; Notable for the following components:   WBC 15.6 (*)    Hemoglobin 10.7 (*)    HCT 35.1 (*)    MCH 25.8 (*)    RDW 19.6 (*)    Neutro Abs 13.1 (*)    Abs Immature Granulocytes 0.13 (*)    All other components within normal limits  CBG MONITORING, ED       Imaging Studies  ordered: I ordered imaging studies including CT neck  I independently visualized and interpreted imaging. I agree with the radiologist interpretation   Medicines ordered and prescription drug management: Meds ordered this encounter  Medications   DISCONTD: magic mouthwash w/lidocaine   acetaminophen (TYLENOL) tablet 1,000 mg   magic mouthwash w/lidocaine   iohexol (OMNIPAQUE) 300 MG/ML solution 75 mL   ipratropium-albuterol (DUONEB) 0.5-2.5 (3) MG/3ML nebulizer solution 3 mL    -I have reviewed the patients home medicines and have made adjustments as needed    Cardiac Monitoring: The patient was maintained on a cardiac monitor.  I personally viewed and interpreted the cardiac monitored which showed an underlying rhythm of: NSR  Social Determinants of Health:  Factors impacting patients care include: former smoker   Reevaluation: After the interventions noted above, I reevaluated the patient and found that they have improved  Co morbidities that complicate the patient evaluation  Past Medical History:  Diagnosis Date   Abdominal aortic stenosis    Anal fissure    Anxiety    Asthma    Complication of anesthesia    COPD (chronic obstructive pulmonary disease) (HCC)    Depression    grief   Diabetes mellitus    Diverticulitis    GERD (gastroesophageal reflux disease)    Glaucoma    Gout    Hypertension    IBS (irritable bowel syndrome)    PONV (postoperative nausea and vomiting)    Port-A-Cath in place 09/19/2021   RA (rheumatoid arthritis) ([place])    Sleep apnea    had one at one time but not anymore   Stroke ([place])    "mini-stroke"- no deficits   TIA (transient ischemic attack)    2010. No deficits      Dispostion: Discharge    Final Clinical Impression(s) / ED Diagnoses Final diagnoses:  None     This chart was dictated using voice recognition software.  Despite best efforts to proofread,  errors can occur which can change the documentation  meaning.    Cristie Hem, MD 11/04/21 [phone]

## 2021-11-04 NOTE — Assessment & Plan Note (Addendum)
-   Stable and well-controlled. -Continue current antihypertensive agents.

## 2021-11-04 NOTE — Assessment & Plan Note (Addendum)
-   Secondary to pneumonia and COPD exacerbation. - Continue treatment with antibiotics, bronchodilator management, steroids tapering, antitussive medication, the use of flutter valve and oxygen supplementation. -Wean off to room air as tolerated; requiring 2 L to maintain saturation especially on exertion. -Patient was speaking in full sentences at discharge.

## 2021-11-05 DIAGNOSIS — J189 Pneumonia, unspecified organism: Secondary | ICD-10-CM | POA: Diagnosis not present

## 2021-11-05 DIAGNOSIS — R1310 Dysphagia, unspecified: Secondary | ICD-10-CM | POA: Diagnosis not present

## 2021-11-05 DIAGNOSIS — J449 Chronic obstructive pulmonary disease, unspecified: Secondary | ICD-10-CM | POA: Diagnosis not present

## 2021-11-05 DIAGNOSIS — K219 Gastro-esophageal reflux disease without esophagitis: Secondary | ICD-10-CM | POA: Diagnosis not present

## 2021-11-05 DIAGNOSIS — B370 Candidal stomatitis: Secondary | ICD-10-CM

## 2021-11-05 LAB — CBC
HCT: 34.2 % — ABNORMAL LOW (ref 36.0–46.0)
Hemoglobin: 10.4 g/dL — ABNORMAL LOW (ref 12.0–15.0)
MCH: 25.9 pg — ABNORMAL LOW (ref 26.0–34.0)
MCHC: 30.4 g/dL (ref 30.0–36.0)
MCV: 85.1 fL (ref 80.0–100.0)
Platelets: 270 10*3/uL (ref 150–400)
RBC: 4.02 MIL/uL (ref 3.87–5.11)
RDW: 18.9 % — ABNORMAL HIGH (ref 11.5–15.5)
WBC: 21.8 10*3/uL — ABNORMAL HIGH (ref 4.0–10.5)
nRBC: 0 % (ref 0.0–0.2)

## 2021-11-05 LAB — GLUCOSE, CAPILLARY
Glucose-Capillary: 314 mg/dL — ABNORMAL HIGH (ref 70–99)
Glucose-Capillary: 272 mg/dL — ABNORMAL HIGH (ref 70–99)
Glucose-Capillary: 292 mg/dL — ABNORMAL HIGH (ref 70–99)
Glucose-Capillary: 401 mg/dL — ABNORMAL HIGH (ref 70–99)

## 2021-11-05 LAB — BASIC METABOLIC PANEL
Anion gap: 9 (ref 5–15)
BUN: 14 mg/dL (ref 8–23)
CO2: 23 mmol/L (ref 22–32)
Calcium: 8.8 mg/dL — ABNORMAL LOW (ref 8.9–10.3)
Chloride: 105 mmol/L (ref 98–111)
Creatinine, Ser: 0.57 mg/dL (ref 0.44–1.00)
GFR, Estimated: >60 mL/min (ref >60)
Glucose, Bld: 319 mg/dL — ABNORMAL HIGH (ref 70–99)
Potassium: 4.2 mmol/L (ref 3.5–5.1)
Sodium: 137 mmol/L (ref 135–145)

## 2021-11-05 MED ORDER — AMOXICILLIN-POT CLAVULANATE 875-125 MG PO TABS: 1.0000 | ORAL_TABLET | Freq: Two times a day (BID) | ORAL | 0 refills | Status: AC

## 2021-11-05 MED ORDER — NICOTINE 21 MG/24HR TD PT24: 21.0000 mg | MEDICATED_PATCH | Freq: Every day | TRANSDERMAL | 0 refills | Status: DC

## 2021-11-05 MED ORDER — NYSTATIN 100000 UNIT/ML MT SUSP: 5.0000 mL | Freq: Two times a day (BID) | OROMUCOSAL | 1 refills | Status: AC

## 2021-11-05 MED ORDER — DM-GUAIFENESIN ER 30-600 MG PO TB12: 1.0000 | ORAL_TABLET | Freq: Two times a day (BID) | ORAL | 0 refills | Status: AC

## 2021-11-05 MED ORDER — PREDNISONE 20 MG PO TABS: ORAL_TABLET | ORAL | 0 refills | Status: DC

## 2021-11-05 MED ORDER — FLUCONAZOLE 100 MG PO TABS: 100.0000 mg | ORAL_TABLET | Freq: Every day | ORAL | 0 refills | Status: AC

## 2021-11-05 MED ORDER — INSULIN ASPART 100 UNIT/ML IJ SOLN
5.0000 [IU] | Freq: Once | INTRAMUSCULAR | Status: AC
  Administered 2021-11-05: 5 [IU] via SUBCUTANEOUS

## 2021-11-05 NOTE — Assessment & Plan Note (Signed)
-   With concerns for oral candidiasis -Good response to treatment using nystatin suspension and Magic mouthwash.

## 2021-11-05 NOTE — Progress Notes (Signed)
SATURATION QUALIFICATIONS: (This note is used to comply with regulatory documentation for home oxygen)  Patient Saturations on Room Air at Rest = 94%  Patient Saturations on Room Air while Ambulating = 87%  Patient Saturations on 2 Liters of oxygen while Ambulating = 98%  Please briefly explain why patient needs home oxygen: Pt dropped below 90% while ambulating on RA. Pt was ambulated about 212ft and became SOB and had to rest several times before completing the walk.

## 2021-11-05 NOTE — Discharge Summary (Addendum)
Physician Discharge Summary   Patient: Brittney Martinez MRN: [id number] DOB: 06-17-1955  Admit date:     11/03/2021  Discharge date: 11/05/21  Discharge Physician: Barton Dubois   PCP: Glenda Chroman, MD   Recommendations at discharge:  The chest x-ray at in 6-8 weeks to assure complete resolution of infiltrates Outpatient follow-up with oncology service Take medications as prescribed Reassess blood pressure and adjust antihypertensive treatment as needed Repeat basic metabolic panel to follow electrolytes and renal function. Close monitoring of patient's CBGs/A1c with reinitiation of hypoglycemic management if required.  Discharge Diagnoses: Principal Problem:   CAP (community acquired pneumonia) Active Problems:   Gastroesophageal reflux disease   Odynophagia   Acute respiratory failure with hypoxia (HCC)   HTN (hypertension)   HLD (hyperlipidemia)   COPD (chronic obstructive pulmonary disease) ([place])   Oral candidiasis   Brief Hospital admission course: HPI: Brittney Martinez is a 66 y.o. female with medical history significant of COPD, GERD, hypertension, rheumatoid arthritis, stroke, IBS, metastatic squamous cell lung cancer to the liver, and more presents the ED with a chief complaint of sudden onset throat pain.  Patient noticed a dull, aching pain in her throat that came on yesterday.  Patient reports that its been worse since it started.  It is worse when she swallows, and worse with cough.  Patient is also had some dyspnea on exertion.  She sometimes has orthopnea but has not noticed any in the past couple of days.  She denies fever.  Patient has a cough it is nonproductive.  Patient reports that she has sores in her mouth that she gets Magic mouthwash for, that are related to chemo.  Chemo was this week.  She had a shot of Neulasta yesterday.  Patient reports that since starting chemo she occasionally has nausea and she has a poor appetite.  When her nausea is under control she can eat.   Given Magic mouthwash in the ER today, her pain in her throat is relieved.  She did have a CT neck that showed reactive lymphadenopathy and upper lobe pneumonia.  Patient was given Rocephin, Tylenol, DuoNeb, Magic mouthwash, Solu-Medrol in the ED.  Patient denies any other complaints at this time.   Patient does not smoke but she is on nicotine gum.  Nicotine patch ordered for while she is in the hospital.  Patient does not drink alcohol.  She is vaccinated for COVID.  Patient is full code, but would not want to be left on life support long-term.  Assessment and Plan: * CAP (community acquired pneumonia) - Leukocytosis at 15.6 (in the setting of just having Neulasta the day prior to admission) -But given ongoing chemo high risk for infection. -There is also concern of episodes of vomiting and difficulty swallowing prior to symptom presentation raising concerns for aspiration pneumonia. -Given infection patient also with COPD exacerbation. -Patient with excellent response to the use of his steroids and oral antibiotic -Discharged home with instruction for steroids tapering and oral antibiotics. -There was also component of oral candidiasis and these will be treated with nystatin suspension. -Repeat x-ray in 6-8 weeks to assure complete resolution of infiltrates. -Continue to wean off oxygen supplementation at discharge; patient requiring 2 L oxygen supplementation to maintain saturation.  Oral candidiasis - Treated with Magic mouthwash and oral nystatin suspension.  COPD (chronic obstructive pulmonary disease) (HCC) -Continue steroids tapering, bronchodilator management, oxygen supplementation with instructions to taper off as tolerated back to room air. -Continue treatment with antibiotic for underlying  infection -Outpatient follow-up with pulmonology service.  HLD (hyperlipidemia) - Continue statin -Heart healthy diet discussed with patient.  HTN (hypertension) - Stable and  well-controlled. -Continue current antihypertensive agents.  Acute respiratory failure with hypoxia (HCC) - Secondary to pneumonia and COPD exacerbation. - Continue treatment with antibiotics, bronchodilator management, steroids tapering, antitussive medication, the use of flutter valve and oxygen supplementation. -Wean off to room air as tolerated; requiring 2 L to maintain saturation especially on exertion. -Patient was speaking in full sentences at discharge.  Controlled type 2 diabetes mellitus without complication, without long-term current use of insulin ([place]) - Patient reports experiencing significant events of hypoglycemia at home and has been taken off any hypoglycemic agent currently. -She is only following diet control. -Elevated CBGs in the setting of steroids usage. -Will recommend close monitoring of CBGs repeat A1c as an outpatient.  Odynophagia - With concerns for oral candidiasis -Good response to treatment using nystatin suspension and Magic mouthwash.  Gastroesophageal reflux disease - Continue PPI.  Tobacco abuse -Cessation counseling provided -Nicotine patch prescribed.  Consultants: None Procedures performed: See below for x-ray reports. Disposition: Home Diet recommendation: Heart healthy diet.  DISCHARGE MEDICATION: Allergies as of 11/05/2021       Reactions   Lisinopril Anaphylaxis   Closing of throat   Aspirin Nausea Only   Propoxyphene N-acetaminophen Nausea And Vomiting   Zithromax [azithromycin] Nausea Only   Erythromycin Rash        Medication List     STOP taking these medications    hydrOXYzine 25 MG tablet Commonly known as: ATARAX   levofloxacin 750 MG tablet Commonly known as: LEVAQUIN   predniSONE 10 MG tablet Commonly known as: DELTASONE   predniSONE 5 MG tablet Commonly known as: DELTASONE       TAKE these medications    albuterol (2.5 MG/3ML) 0.083% nebulizer solution Commonly known as: PROVENTIL Take 2.5 mg by  nebulization every 6 (six) hours as needed for wheezing or shortness of breath.   albuterol 108 (90 Base) MCG/ACT inhaler Commonly known as: VENTOLIN HFA Inhale 2 puffs into the lungs every 6 (six) hours as needed for wheezing or shortness of breath.   allopurinol 300 MG tablet Commonly known as: ZYLOPRIM Take 300 mg by mouth daily.   amoxicillin-clavulanate 875-125 MG tablet Commonly known as: AUGMENTIN Take 1 tablet by mouth 2 (two) times daily for 7 days.   aspirin 81 MG tablet Take 81 mg by mouth daily.   atorvastatin 20 MG tablet Commonly known as: LIPITOR Take 20 mg by mouth at bedtime.   B-D UF III MINI PEN NEEDLES 31G X 5 MM Misc Generic drug: Insulin Pen Needle Inject into the skin 3 (three) times daily.   benzonatate 200 MG capsule Commonly known as: TESSALON Take 1 capsule (200 mg total) by mouth every 8 (eight) hours as needed.   budesonide-formoterol 160-4.5 MCG/ACT inhaler Commonly known as: SYMBICORT Inhale 2 puffs into the lungs in the morning and at bedtime.   CARBOPLATIN IV Inject into the vein every 21 ( twenty-one) days.   clopidogrel 75 MG tablet Commonly known as: PLAVIX Take 75 mg by mouth daily.   cycloSPORINE 0.05 % ophthalmic emulsion Commonly known as: RESTASIS Place 1 drop into both eyes 2 (two) times daily.   dextromethorphan-guaiFENesin 30-600 MG 12hr tablet Commonly known as: MUCINEX DM Take 1 tablet by mouth 2 (two) times daily for 10 days.   diphenhydrAMINE 25 MG tablet Commonly known as: BENADRYL Take 25 mg by mouth daily  as needed for allergies.   doxepin 75 MG capsule Commonly known as: SINEQUAN Take 75 mg by mouth at bedtime.   esomeprazole 40 MG capsule Commonly known as: NEXIUM Take 40 mg by mouth in the morning and at bedtime.   gabapentin 300 MG capsule Commonly known as: NEURONTIN Take 2 capsules (600 mg total) by mouth 3 (three) times daily. If 1 capsule 3 times daily is not helping, you can increase to 2  capsules 3 times daily.   HYDROcodone bit-homatropine 5-1.5 MG/5ML syrup Commonly known as: HYCODAN Take 5 mLs by mouth every 6 (six) hours as needed.   hyoscyamine 0.125 MG SL tablet Commonly known as: LEVSIN SL Place 1 tablet (0.125 mg total) under the tongue every 6 (six) hours as needed for cramping.   ibuprofen 200 MG tablet Commonly known as: ADVIL Take 400 mg by mouth every 6 (six) hours as needed for moderate pain.   loperamide 2 MG capsule Commonly known as: IMODIUM Take 1 capsule (2 mg total) by mouth as needed for diarrhea or loose stools. Take 2 capsules after the first loose stool of the day.  For each subsequent loose stool take 1 capsule.  Do not take more than 8 capsules in a day.   lubiprostone 8 MCG capsule Commonly known as: Amitiza TAKE 1 CAPSULE BY MOUTH  ONCE DAILY WITH BREAKFAST What changed:  how much to take how to take this when to take this reasons to take this additional instructions   magnesium oxide 400 (240 Mg) MG tablet Commonly known as: MAG-OX Take 1 tablet (400 mg total) by mouth 2 (two) times daily.   MELATONIN PO Take 1 capsule by mouth at bedtime as needed (sleep).   methocarbamol 500 MG tablet Commonly known as: ROBAXIN Take 500 mg by mouth every 6 (six) hours as needed for muscle spasms.   metoprolol succinate 100 MG 24 hr tablet Commonly known as: TOPROL-XL Take 100 mg by mouth every morning.   nicotine 21 mg/24hr patch Commonly known as: NICODERM CQ - dosed in mg/24 hours Place 1 patch (21 mg total) onto the skin daily. Start taking on: November 06, 2021   nystatin 100000 UNIT/ML suspension Commonly known as: MYCOSTATIN Take 5 mLs (500,000 Units total) by mouth in the morning and at bedtime for 7 days. SWISH AND SWALLOW What changed:  when to take this reasons to take this   ondansetron 8 MG tablet Commonly known as: ZOFRAN Take 1 tablet (8 mg total) by mouth every 8 (eight) hours as needed for refractory nausea /  vomiting.   OneTouch Delica Plus [id number] Misc Apply topically.   OneTouch Ultra test strip Generic drug: glucose blood   PACLITAXEL IV Inject into the vein every 21 ( twenty-one) days.   traMADol 50 MG tablet Commonly known as: ULTRAM Take 50-100 mg by mouth every 6 (six) hours as needed for moderate pain.   zolpidem 5 MG tablet Commonly known as: AMBIEN Take 5 mg by mouth at bedtime as needed.               Durable Medical Equipment  (From admission, onward)           Start     Ordered   11/05/21 1350  For home use only DME oxygen  Once       Question Answer Comment  Length of Need 6 Months   Mode or (Route) Nasal cannula   Liters per Minute 2   Frequency Continuous (stationary and  portable oxygen unit needed)   Oxygen conserving device Yes   Oxygen delivery system Gas      11/05/21 1349            Follow-up Information     Vyas, Dhruv B, MD. Schedule an appointment as soon as possible for a visit in 10 day(s).   Specialty: Internal Medicine Contact information: [place] [address] [phone]                Discharge Exam: Filed Weights   11/04/21 0800  Weight: 76.5 kg   General exam: Alert, awake, oriented x 3; speaking in full sentences, no fever, no nausea vomiting.  Demonstrating good oxygen saturation with 2 L supplementation.  Feeling ready to go home. Respiratory system: Improved air movement bilaterally, very little expiratory wheezing appreciated on exam, no using accessory muscles, good saturation on 2 L supplementation and able to speak in full sentences. Cardiovascular system:RRR. No rubs or gallops; no JVD. Gastrointestinal system: Abdomen is nondistended, soft and nontender. No organomegaly or masses felt. Normal bowel sounds heard. Central nervous system: Alert and oriented. No focal neurological deficits. Extremities: No cyanosis or clubbing. Skin: No petechiae. Psychiatry: Judgement and insight appear  normal. Mood & affect appropriate.    Condition at discharge: Stable and improved.  The results of significant diagnostics from this hospitalization (including imaging, microbiology, ancillary and laboratory) are listed below for reference.   Imaging Studies: DG CHEST PORT 1 VIEW  Result Date: 11/04/2021 CLINICAL DATA:  Community-acquired pneumonia. EXAM: PORTABLE CHEST 1 VIEW COMPARISON:  07/12/2021. FINDINGS: The heart size and mediastinal contours are stable. There is atherosclerotic calcification of the aorta. Patchy airspace opacities are noted in the perihilar region on the left and at the left lung base. No effusion or pneumothorax. A left chest port terminates over the superior vena cava. No acute osseous abnormality. IMPRESSION: Patchy airspace disease in the perihilar region on the left and at the left lung base, compatible with history of pneumonia. Electronically Signed   By: Brett Fairy M.D.   On: 11/04/2021 04:23   CT Soft Tissue Neck W Contrast  Result Date: 11/04/2021 CLINICAL DATA:  Initial evaluation for soft tissue swelling, infection suspected. EXAM: CT NECK WITH CONTRAST TECHNIQUE: Multidetector CT imaging of the neck was performed using the standard protocol following the bolus administration of intravenous contrast. RADIATION DOSE REDUCTION: This exam was performed according to the departmental dose-optimization program which includes automated exposure control, adjustment of the mA and/or kV according to patient size and/or use of iterative reconstruction technique. CONTRAST:  61mL OMNIPAQUE IOHEXOL 300 MG/ML  SOLN COMPARISON:  Prior CT from 05/24/2021, as well as prior PET-CT from 06/22/2021 and chest CT from 05/24/2021 FINDINGS: Pharynx and larynx: Oral cavity within normal limits. Oropharynx and nasopharynx within normal limits. No retropharyngeal collection or swelling. Negative epiglottis. Hypopharynx and supraglottic larynx within normal limits. Glottis grossly symmetric  and within normal limits. Subglottic airway patent clear. Salivary glands: Salivary glands including the parotid and submandibular glands are within normal limits. Thyroid: Normal. Lymph nodes: Prominent left posterior level 5 a node measures up to 1 cm in short axis (series 2, image 32). Multiple additional scattered subcentimeter nodes seen along the left posterior chain. Few scattered prominent left supraclavicular nodes measure up to 1 cm. Enlarged precarinal node partially visualized, measuring 1.6 cm in short axis (series 2, image 101). Multiple additional scattered subcentimeter nodes noted within the visualized upper mediastinum. No visible necrosis. Associated mild swelling  with hazy stranding within the left lateral neck. No right-sided adenopathy. Vascular: Moderate to advanced atheromatous disease seen about the aortic arch, carotid bifurcations and carotid siphons. Normal intravascular enhancement seen throughout the neck. Left-sided Port-A-Cath in place. Limited intracranial: Unremarkable. Visualized orbits: Prior bilateral ocular lens replacement. Otherwise unremarkable. Mastoids and visualized paranasal sinuses: Small retention cyst noted within the right sphenoid sinus. Visualized paranasal sinuses are otherwise clear. Visualized mastoids and middle ear cavities are well pneumatized and free of fluid. Skeleton: No discrete or worrisome osseous lesions. Patient is edentulous. Congenital segmental fusion of the C6 and C7 vertebral bodies noted. Ossification of the stylohyoid ligaments bilaterally, which can be seen the setting of Eagle syndrome. Upper chest: Emphysematous changes noted within the visualized lungs. Patchy irregular opacities noted within the partially visualized peripheral left upper lobe, suspicious for possible superimposed infiltrates (series 3, image 97). Other: None. IMPRESSION: 1. Scattered prominent and mildly enlarged lymph nodes involving the left posterior cervical chain,  left supraclavicular region, and visualized upper mediastinum. Associated mild hazy stranding and swelling within the adjacent left neck. Findings are nonspecific, but concerning for possible nodal metastatic disease given patient history. Reactive adenopathy/lymphadenitis would be the primary differential consideration. No discrete abscess or drainable fluid collection. 2. Patchy irregular opacities within the partially visualized left upper lobe, suspicious for infection/pneumonia. 3. Aortic Atherosclerosis (ICD10-I70.0) and Emphysema (ICD10-J43.9). Electronically Signed   By: Jeannine Boga M.D.   On: 11/04/2021 00:21    Microbiology: Results for orders placed or performed during the hospital encounter of 11/03/21  SARS Coronavirus 2 by RT PCR (hospital order, performed in [facility] hospital lab) *cepheid single result test* Anterior Nasal Swab     Status: None   Collection Time: 11/04/21  2:35 AM   Specimen: Anterior Nasal Swab  Result Value Ref Range Status   SARS Coronavirus 2 by RT PCR NEGATIVE NEGATIVE Final    Comment: (NOTE) SARS-CoV-2 target nucleic acids are NOT DETECTED.  The SARS-CoV-2 RNA is generally detectable in upper and lower respiratory specimens during the acute phase of infection. The lowest concentration of SARS-CoV-2 viral copies this assay can detect is 250 copies / mL. A negative result does not preclude SARS-CoV-2 infection and should not be used as the sole basis for treatment or other patient management decisions.  A negative result may occur with improper specimen collection / handling, submission of specimen other than nasopharyngeal swab, presence of viral mutation(s) within the areas targeted by this assay, and inadequate number of viral copies (<250 copies / mL). A negative result must be combined with clinical observations, patient history, and epidemiological information.  Fact Sheet for Patients:    https://www.patel.info/  Fact Sheet for Healthcare Providers: https://hall.com/  This test is not yet approved or  cleared by the [place] FDA and has been authorized for detection and/or diagnosis of SARS-CoV-2 by FDA under an Emergency Use Authorization (EUA).  This EUA will remain in effect (meaning this test can be used) for the duration of the COVID-19 declaration under Section 564(b)(1) of the Act, 21 U.S.C. section 360bbb-3(b)(1), unless the authorization is terminated or revoked sooner.  Performed at [facility], [address] [place], [place] [address]     Labs: CBC: Recent Labs  Lab 11/01/21 [phone] 11/03/21 2149 11/04/21 0554 11/05/21 0434  WBC 7.6 15.6* 18.7* 21.8*  NEUTROABS 4.5 13.1* 17.3*  --   HGB 9.9* 10.7* 11.4* 10.4*  HCT 32.3* 35.1* 37.0 34.2*  MCV 85.4 84.8 85.1 85.1  PLT 305  318 305 729   Basic Metabolic Panel: Recent Labs  Lab 11/01/21 0816 11/03/21 2149 11/04/21 0554 11/04/21 1801 11/04/21 2135 11/05/21 0434  NA 139 139 137  --   --  137  K 3.5 4.9 4.4  --   --  4.2  CL 106 104 104  --   --  105  CO2 24 25 25   --   --  23  GLUCOSE 136* 67* 261* 461* 414* 319*  BUN 7* 13 14  --   --  14  CREATININE 0.64 0.57 0.61  --   --  0.57  CALCIUM 9.0 8.9 9.0  --   --  8.8*  MG 1.6*  --  1.9  --   --   --    Liver Function Tests: Recent Labs  Lab 11/01/21 0816 11/03/21 2149 11/04/21 0554  AST 17 39 24  ALT 21 26 22   ALKPHOS 100 102 107  BILITOT 0.3 1.2 0.7  PROT 6.8 7.5 7.4  ALBUMIN 2.4* 2.8* 2.6*   CBG: Recent Labs  Lab 11/04/21 1618 11/04/21 2107 11/05/21 0157 11/05/21 0741 11/05/21 1122  GLUCAP 498* 449* 314* 272* 292*    Discharge time spent: greater than 30 minutes.  Signed: Barton Dubois, MD [facility] 11/05/2021

## 2021-11-05 NOTE — Assessment & Plan Note (Signed)
-   Patient reports experiencing significant events of hypoglycemia at home and has been taken off any hypoglycemic agent currently. -She is only following diet control. -Elevated CBGs in the setting of steroids usage. -Will recommend close monitoring of CBGs repeat A1c as an outpatient.

## 2021-11-05 NOTE — Progress Notes (Signed)
Nsg Discharge Note  Admit Date:  11/03/2021 Discharge date: 11/05/2021   Brittney Martinez to be D/C'd Home per MD order.  AVS completed.  Copy for chart, and copy for patient signed, and dated. Patient/caregiver able to verbalize understanding.  Discharge Medication: Allergies as of 11/05/2021       Reactions   Lisinopril Anaphylaxis   Closing of throat   Aspirin Nausea Only   Propoxyphene N-acetaminophen Nausea And Vomiting   Zithromax [azithromycin] Nausea Only   Erythromycin Rash        Medication List     STOP taking these medications    hydrOXYzine 25 MG tablet Commonly known as: ATARAX   levofloxacin 750 MG tablet Commonly known as: LEVAQUIN       TAKE these medications    albuterol (2.5 MG/3ML) 0.083% nebulizer solution Commonly known as: PROVENTIL Take 2.5 mg by nebulization every 6 (six) hours as needed for wheezing or shortness of breath.   albuterol 108 (90 Base) MCG/ACT inhaler Commonly known as: VENTOLIN HFA Inhale 2 puffs into the lungs every 6 (six) hours as needed for wheezing or shortness of breath.   allopurinol 300 MG tablet Commonly known as: ZYLOPRIM Take 300 mg by mouth daily.   amoxicillin-clavulanate 875-125 MG tablet Commonly known as: AUGMENTIN Take 1 tablet by mouth 2 (two) times daily for 7 days.   aspirin 81 MG tablet Take 81 mg by mouth daily.   atorvastatin 20 MG tablet Commonly known as: LIPITOR Take 20 mg by mouth at bedtime.   B-D UF III MINI PEN NEEDLES 31G X 5 MM Misc Generic drug: Insulin Pen Needle Inject into the skin 3 (three) times daily.   benzonatate 200 MG capsule Commonly known as: TESSALON Take 1 capsule (200 mg total) by mouth every 8 (eight) hours as needed.   budesonide-formoterol 160-4.5 MCG/ACT inhaler Commonly known as: SYMBICORT Inhale 2 puffs into the lungs in the morning and at bedtime.   CARBOPLATIN IV Inject into the vein every 21 ( twenty-one) days.   clopidogrel 75 MG tablet Commonly known  as: PLAVIX Take 75 mg by mouth daily.   cycloSPORINE 0.05 % ophthalmic emulsion Commonly known as: RESTASIS Place 1 drop into both eyes 2 (two) times daily.   dextromethorphan-guaiFENesin 30-600 MG 12hr tablet Commonly known as: MUCINEX DM Take 1 tablet by mouth 2 (two) times daily for 10 days.   diphenhydrAMINE 25 MG tablet Commonly known as: BENADRYL Take 25 mg by mouth daily as needed for allergies.   doxepin 75 MG capsule Commonly known as: SINEQUAN Take 75 mg by mouth at bedtime.   esomeprazole 40 MG capsule Commonly known as: NEXIUM Take 40 mg by mouth in the morning and at bedtime.   fluconazole 100 MG tablet Commonly known as: Diflucan Take 1 tablet (100 mg total) by mouth daily for 5 days.   gabapentin 300 MG capsule Commonly known as: NEURONTIN Take 2 capsules (600 mg total) by mouth 3 (three) times daily. If 1 capsule 3 times daily is not helping, you can increase to 2 capsules 3 times daily.   HYDROcodone bit-homatropine 5-1.5 MG/5ML syrup Commonly known as: HYCODAN Take 5 mLs by mouth every 6 (six) hours as needed.   hyoscyamine 0.125 MG SL tablet Commonly known as: LEVSIN SL Place 1 tablet (0.125 mg total) under the tongue every 6 (six) hours as needed for cramping.   ibuprofen 200 MG tablet Commonly known as: ADVIL Take 400 mg by mouth every 6 (six) hours as  needed for moderate pain.   loperamide 2 MG capsule Commonly known as: IMODIUM Take 1 capsule (2 mg total) by mouth as needed for diarrhea or loose stools. Take 2 capsules after the first loose stool of the day.  For each subsequent loose stool take 1 capsule.  Do not take more than 8 capsules in a day.   lubiprostone 8 MCG capsule Commonly known as: Amitiza TAKE 1 CAPSULE BY MOUTH  ONCE DAILY WITH BREAKFAST What changed:  how much to take how to take this when to take this reasons to take this additional instructions   magnesium oxide 400 (240 Mg) MG tablet Commonly known as: MAG-OX Take  1 tablet (400 mg total) by mouth 2 (two) times daily.   MELATONIN PO Take 1 capsule by mouth at bedtime as needed (sleep).   methocarbamol 500 MG tablet Commonly known as: ROBAXIN Take 500 mg by mouth every 6 (six) hours as needed for muscle spasms.   metoprolol succinate 100 MG 24 hr tablet Commonly known as: TOPROL-XL Take 100 mg by mouth every morning.   nicotine 21 mg/24hr patch Commonly known as: NICODERM CQ - dosed in mg/24 hours Place 1 patch (21 mg total) onto the skin daily. Start taking on: November 06, 2021   nystatin 100000 UNIT/ML suspension Commonly known as: MYCOSTATIN Take 5 mLs (500,000 Units total) by mouth in the morning and at bedtime for 7 days. SWISH AND SWALLOW What changed:  when to take this reasons to take this   ondansetron 8 MG tablet Commonly known as: ZOFRAN Take 1 tablet (8 mg total) by mouth every 8 (eight) hours as needed for refractory nausea / vomiting.   OneTouch Delica Plus [id number] Misc Apply topically.   OneTouch Ultra test strip Generic drug: glucose blood   PACLITAXEL IV Inject into the vein every 21 ( twenty-one) days.   predniSONE 20 MG tablet Commonly known as: DELTASONE Take 3 tablets by mouth daily x1 day; then 2 tablets by mouth daily x2-day; then 1 tablet by mouth daily x3 days; then half tablet by mouth daily x3 days and stop prednisone. What changed:  medication strength how much to take how to take this when to take this additional instructions Another medication with the same name was removed. Continue taking this medication, and follow the directions you see here.   traMADol 50 MG tablet Commonly known as: ULTRAM Take 50-100 mg by mouth every 6 (six) hours as needed for moderate pain.   zolpidem 5 MG tablet Commonly known as: AMBIEN Take 5 mg by mouth at bedtime as needed.               Durable Medical Equipment  (From admission, onward)           Start     Ordered   11/05/21 1350  For home  use only DME oxygen  Once       Question Answer Comment  Length of Need 6 Months   Mode or (Route) Nasal cannula   Liters per Minute 2   Frequency Continuous (stationary and portable oxygen unit needed)   Oxygen conserving device Yes   Oxygen delivery system Gas      11/05/21 1349            Discharge Assessment: Vitals:   11/05/21 1320 11/05/21 1424  BP: (!) 98/53   Pulse: 84   Resp:    Temp: 98.3 F (36.8 C)   SpO2: 100% 96%   Skin clean,  dry and intact without evidence of skin break down, no evidence of skin tears noted. IV catheter discontinued intact. Site without signs and symptoms of complications - no redness or edema noted at insertion site, patient denies c/o pain - only slight tenderness at site.  Dressing with slight pressure applied.  D/c Instructions-Education: Discharge instructions given to patient/family with verbalized understanding. D/c education completed with patient/family including follow up instructions, medication list, d/c activities limitations if indicated, with other d/c instructions as indicated by MD - patient able to verbalize understanding, all questions fully answered. Patient instructed to return to ED, call 911, or call MD for any changes in condition.  Patient escorted via [place], and D/C home via private auto.  Clovis Fredrickson, LPN 08/11/6293 2:84 PM

## 2021-11-05 NOTE — TOC Initial Note (Signed)
Transition of Care [facility]) - Initial/Assessment Note    Patient Details  Name: Brittney Martinez MRN: [id number] Date of Birth: Feb 16, 1956  Transition of Care [facility]) CM/SW Contact:    Joanne Chars, LCSW Phone Number: 11/05/2021, 4:00 PM  Clinical Narrative:    CSW contacted due to need for Home O2.  Spoke with pt, who lives with son and his family.  Confirmed pt address.  Spoke with Jasmine at [place] who will get it delivered ASAP.                 Expected Discharge Plan: Home/Self Care Barriers to Discharge: No Barriers Identified   Patient Goals and CMS Choice Patient states their goals for this hospitalization and ongoing recovery are:: back to normal      Expected Discharge Plan and Services Expected Discharge Plan: Home/Self Care In-house Referral: Clinical Social Work   Post Acute Care Choice: Durable Medical Equipment Living arrangements for the past 2 months: Single Family Home Expected Discharge Date: 11/05/21               DME Arranged: Oxygen DME Agency: [employer] Date DME Agency Contacted: 11/05/21 Time DME Agency Contacted: [phone] Representative spoke with at DME Agency: [place]: NA          Prior Living Arrangements/Services Living arrangements for the past 2 months: [place] Lives with:: Adult Children (son and son's family)   Do you feel safe going back to the place where you live?: Yes      Need for Family Participation in Patient Care: No (Comment) Care giver support system in place?: Yes (comment) Current home services: Other (comment) (none)    Activities of Daily Living      Permission Sought/Granted                  Emotional Assessment Appearance::  (phone contact) Attitude/Demeanor/Rapport: Engaged Affect (typically observed): Appropriate, Pleasant Orientation: :  (not recorded)      Admission diagnosis:  Odynophagia [R13.10] CAP (community acquired pneumonia) [J18.9] Acute respiratory failure with  hypoxia ([place]) [J96.01] Community acquired pneumonia, unspecified laterality [J18.9] Patient Active Problem List   Diagnosis Date Noted   Oral candidiasis    CAP (community acquired pneumonia) 11/04/2021   HLD (hyperlipidemia) 11/04/2021   COPD (chronic obstructive pulmonary disease) ([place]) 11/04/2021   Port-A-Cath in place 09/19/2021   Squamous cell lung cancer, unspecified laterality ([place]) 09/13/2021   Cancer, metastatic to liver ([place]) 07/06/2021   Cancer ([place]) 06/26/2021   Mediastinal adenopathy 06/06/2021   Liver masses 06/06/2021   Rheumatoid arthritis ([place]) 03/20/2018   Hypokalemia 03/20/2018   Controlled type 2 diabetes mellitus without complication, without long-term current use of insulin ([place]) 03/20/2018   COPD with acute bronchitis ([place]) 03/20/2018   Acute respiratory failure with hypoxia ([place]) 03/20/2018   HTN (hypertension) 03/20/2018   OSA on CPAP 03/20/2018   Influenza B 03/19/2018   Diarrhea 01/24/2016   Melena 11/01/2014   Incisional hernia 03/22/2014   Hemorrhoids 36/64/4034   Periumbilical hernia 74/25/9563   Fatty liver 02/05/2014   Anorectal fissure 11/30/2013   Irritable bowel syndrome 09/29/2013   Heme + stool 09/29/2013   Rectal pain 09/29/2013   Proctalgia 01/09/2011   Obesity 01/09/2011   Family hx of colon cancer 01/09/2011   Intertriginous candidiasis 01/09/2011   Gastroesophageal reflux disease 11/15/2009   Constipation 11/15/2009   RECTAL BLEEDING 11/15/2009   Odynophagia 11/15/2009   EPIGASTRIC PAIN 11/15/2009   PCP:  Glenda Chroman,  MD Pharmacy:   [facility] [address], [place] - [address] [place] [address] [place] [address] [place] [address] Phone: [phone] Fax: [phone]  [facility] ([place], [place] [facility] [address] [place] [address] [place] [address] Phone: [phone] Fax: [phone]     Social Determinants of Health (SDOH) Interventions    Readmission Risk  Interventions     No data to display

## 2021-11-05 NOTE — Assessment & Plan Note (Signed)
-   Treated with Magic mouthwash and oral nystatin suspension.

## 2021-11-07 LAB — LEGIONELLA PNEUMOPHILA SEROGP 1 UR AG: L. pneumophila Serogp 1 Ur Ag: NEGATIVE

## 2021-11-08 DIAGNOSIS — J449 Chronic obstructive pulmonary disease, unspecified: Secondary | ICD-10-CM | POA: Diagnosis not present

## 2021-11-08 DIAGNOSIS — G629 Polyneuropathy, unspecified: Secondary | ICD-10-CM | POA: Diagnosis not present

## 2021-11-08 DIAGNOSIS — Z299 Encounter for prophylactic measures, unspecified: Secondary | ICD-10-CM | POA: Diagnosis not present

## 2021-11-08 DIAGNOSIS — I10 Essential (primary) hypertension: Secondary | ICD-10-CM | POA: Diagnosis not present

## 2021-11-08 DIAGNOSIS — F17210 Nicotine dependence, cigarettes, uncomplicated: Secondary | ICD-10-CM | POA: Diagnosis not present

## 2021-11-09 ENCOUNTER — Inpatient Hospital Stay: Payer: Medicare Other | Admitting: Licensed Clinical Social Worker

## 2021-11-09 DIAGNOSIS — C3490 Malignant neoplasm of unspecified part of unspecified bronchus or lung: Secondary | ICD-10-CM

## 2021-11-09 NOTE — Progress Notes (Signed)
[place] Clinical Social Work  Initial Assessment   Brittney Martinez is a 66 y.o. year old female contacted by phone. Clinical Social Work was referred by medical provider for assessment of psychosocial needs.   SDOH (Social Determinants of Health) assessments performed: Yes   SDOH Screenings   Tobacco Use: Medium Risk (11/03/2021)     Distress Screen completed: No     No data to display            Family/Social Information:  Housing Arrangement: For the past 15 years patient has lived with her son, daughter in law, and granddaughter, who is reportedly moving out soon. Family members/support persons in your life? Family- patient's daughter in law is a 24 year cancer survivor Transportation concerns: no  Employment: Retired .  Income source: Paediatric nurse concerns:  Pt states the collective income in the home is limited and it is challenging to meet the bills monthly Type of concern: Utilities Food access concerns: no Religious or spiritual practice: Not known Services Currently in place:  social security retirement/Medicare- Pt states she applied for Medicaid and was only approved for the [facility] which does not provide needed benefits  Coping/ Adjustment to diagnosis: Patient understands treatment plan and what happens next? yes Concerns about diagnosis and/or treatment: How I will pay for the services I need and Quality of life Patient reported stressors: Finances and Adjusting to my illness Hopes and/or priorities: Pt's priority is to start treatment w/ the hope of a positive result Patient enjoys time with family/ friends Current coping skills/ strengths: Supportive family/friends     SUMMARY: Current SDOH Barriers:  Financial constraints related to fixed income  Clinical Social Work Clinical Goal(s):  Explore community resource options for unmet needs related to:  Financial Strain   Interventions: Discussed common feeling and  emotions when being diagnosed with cancer, and the importance of support during treatment Informed patient of the support team roles and support services at [facility] Provided [place] contact information and encouraged patient to call with any questions or concerns Referred patient to [facility], emailed supportive service flyer and flyer for [facility] as well   Follow Up Plan: Patient will contact CSW with any support or resource needs Patient verbalizes understanding of plan: Yes    Henriette Combs, LCSW

## 2021-11-10 ENCOUNTER — Encounter: Payer: Self-pay | Admitting: Licensed Clinical Social Worker

## 2021-11-10 DIAGNOSIS — C3490 Malignant neoplasm of unspecified part of unspecified bronchus or lung: Secondary | ICD-10-CM

## 2021-11-10 NOTE — Progress Notes (Signed)
[place] CSW Progress Note  Clinical Education officer, museum contacted patient by phone to explain the [employer].  Packet containing instructions to apply for grant emailed and financial counselor notified.      Henriette Combs, LCSW

## 2021-11-15 ENCOUNTER — Ambulatory Visit (HOSPITAL_COMMUNITY)
Admission: RE | Admit: 2021-11-15 | Discharge: 2021-11-15 | Disposition: A | Discharge status: Home / Self Care | Payer: Medicare Other | Source: Ambulatory Visit | Attending: Hematology | Admitting: Hematology

## 2021-11-15 DIAGNOSIS — C3490 Malignant neoplasm of unspecified part of unspecified bronchus or lung: Secondary | ICD-10-CM | POA: Diagnosis not present

## 2021-11-15 DIAGNOSIS — C787 Secondary malignant neoplasm of liver and intrahepatic bile duct: Secondary | ICD-10-CM | POA: Diagnosis not present

## 2021-11-15 DIAGNOSIS — J90 Pleural effusion, not elsewhere classified: Secondary | ICD-10-CM | POA: Diagnosis not present

## 2021-11-15 DIAGNOSIS — R590 Localized enlarged lymph nodes: Secondary | ICD-10-CM | POA: Diagnosis not present

## 2021-11-15 DIAGNOSIS — I700 Atherosclerosis of aorta: Secondary | ICD-10-CM | POA: Diagnosis not present

## 2021-11-15 DIAGNOSIS — I739 Peripheral vascular disease, unspecified: Secondary | ICD-10-CM | POA: Diagnosis not present

## 2021-11-15 MED ORDER — IOHEXOL 300 MG/ML  SOLN
100.0000 mL | Freq: Once | INTRAMUSCULAR | Status: AC | PRN
  Administered 2021-11-15: 100 mL via INTRAVENOUS

## 2021-11-15 MED ORDER — HEPARIN SOD (PORK) LOCK FLUSH 100 UNIT/ML IV SOLN
500.0000 [IU] | Freq: Once | INTRAVENOUS | Status: AC
  Administered 2021-11-15: 500 [IU] via INTRAVENOUS

## 2021-11-16 ENCOUNTER — Telehealth: Payer: Self-pay | Admitting: Hematology

## 2021-11-16 NOTE — Telephone Encounter (Signed)
Spoke with pt regarding the [facility] assist fund, she is eligible. Pt will drop off her financial doc asap.

## 2021-11-16 NOTE — Progress Notes (Unsigned)
[place], [place] [address]   CLINIC:  Medical Oncology/Hematology  PCP:  Glenda Chroman, MD [address] / [place] [address] [phone]   REASON FOR VISIT:  Follow-up for metastatic squamous cell lung cancer to the liver  PRIOR THERAPY: none  NGS Results: PD-L1 negative, no targetable mutations.  CURRENT THERAPY: Carboplatin and paclitaxel  BRIEF ONCOLOGIC HISTORY:  Oncology History  Squamous cell lung cancer, unspecified laterality ([place])  09/13/2021 Initial Diagnosis   Squamous cell lung cancer, unspecified laterality ([place])   09/20/2021 - 10/13/2021 Chemotherapy   Patient is on Treatment Plan : LUNG NSCLC Carboplatin / Paclitaxel q21d x 6 cycles     09/20/2021 - 11/03/2021 Chemotherapy   Patient is on Treatment Plan : LUNG NSCLC Carboplatin + Paclitaxel q21d X 6 Cycles       CANCER STAGING:  Cancer Staging  Squamous cell lung cancer, unspecified laterality ([place]) Staging form: Lung, AJCC 8th Edition - Clinical stage from 09/13/2021: Stage IVB (cTX, cN3, pM1c) - Unsigned  INTERVAL HISTORY:  Brittney Martinez, a 66 y.o. female, seen for follow-up of metastatic lung cancer.  She reported constant numbness in the toes and fingertips which is worse since last time.  She states this time the pain started after chemo and has not gotten better but has gotten worse.  It hurts for her to walk.  She states that she cannot hold anything and that her hands keep dropping objects.  She is accompanied by her daughter and is using peripheral oxygen.  Relates that at the beginning of the month she was admitted to the hospital after an episode of vomiting.  She does have some difficulty swallowing.  At today's visit we reviewed recent scan results which unfortunately show progression.  Pacifically she has significant progression of left suprahilar and hilar mass with progressive nodular interstitial changes in the left upper lobe suspicious for interstitial  spread of tumor.  She also had progressive hepatic metastatic disease.  She has had some diarrhea.  We will give her a liter of fluid. We will increase her gabapentin at night only.  I am concerned that increasing it any more in the day would affect her balance and dizziness. She rates her energy level at 50%, appetite at 70%, pain is 9/10 to hands and feet  Vital signs: Blood pressure 99/73, pulse 99, respirations 20, temperature 99.1, oxygen saturation 99, weight 164.4 pounds REVIEW OF SYSTEMS:  Review of Systems  Constitutional:  Negative for appetite change and fatigue.  Respiratory:  Positive for cough (Chronic, nonproductive, stable) and shortness of breath (With exertion).   Cardiovascular:  Positive for chest pain (Only when she coughs).  Gastrointestinal:  Positive for vomiting (x one episode).  Neurological:  Positive for dizziness (If she gets up too fast) and numbness (Significant pain when she walks with inability to feel her feet, unable to hold objects without dropping them.  In the past the numbness and tingling would wax and wane but it has not relented since her last chemo).  All other systems reviewed and are negative.   PAST MEDICAL/SURGICAL HISTORY:  Past Medical History:  Diagnosis Date   Abdominal aortic stenosis    Anal fissure    Anxiety    Asthma    Complication of anesthesia    COPD (chronic obstructive pulmonary disease) ([place])    Depression    grief   Diabetes mellitus    Diverticulitis    GERD (  gastroesophageal reflux disease)    Glaucoma    Gout    Hypertension    IBS (irritable bowel syndrome)    PONV (postoperative nausea and vomiting)    Port-A-Cath in place 09/19/2021   RA (rheumatoid arthritis) ([place])    Sleep apnea    had one at one time but not anymore   Stroke [facility])    "mini-stroke"- no deficits   TIA (transient ischemic attack)    2010. No deficits   Past Surgical History:  Procedure Laterality Date   ABDOMINAL HYSTERECTOMY  1982    complete   BIOPSY  11/17/2018   Procedure: BIOPSY;  Surgeon: Daneil Dolin, MD;  Location: [facility];  Service: Endoscopy;;   BREAST LUMPECTOMY     right-benign   CATARACT EXTRACTION W/PHACO Right 05/13/2017   Procedure: CATARACT EXTRACTION PHACO AND INTRAOCULAR LENS PLACEMENT ([place]);  Surgeon: Tonny Branch, MD;  Location: [facility] ORS;  Service: Ophthalmology;  Laterality: Right;  CDE: 8.59   CATARACT EXTRACTION W/PHACO Left 05/27/2017   Procedure: CATARACT EXTRACTION WITH  PHACOEMULSIFICATION AND INTRAOCULAR LENS PLACEMENT LEFT EYE;  Surgeon: Tonny Branch, MD;  Location: [facility] ORS;  Service: Ophthalmology;  Laterality: Left;  CDE: 5.66   CHOLECYSTECTOMY  2003   COLONOSCOPY  12/14/09   Dr. Gala Romney :anal papilla and hemorrhoids,diminutive hperplastic rectal polyps/normal colon   COLONOSCOPY N/A 10/29/2013   RPR:XYVO papilla and internal hemorrhoids; colonic polyps-removed as described above. I suspect benign anorectal bleeding in the setting of hemorrhoids and  possibly fissure. tubular adenoma. next TCS 10/2020.   COLONOSCOPY WITH PROPOFOL N/A 11/17/2018   Dr. Gala Romney: Nonbleeding internal hemorrhoids, hemorrhoids were moderate and grade.  Exam otherwise normal.  Colonoscopy in 5 years.   ELBOW SURGERY     right   ESOPHAGOGASTRODUODENOSCOPY  12/14/09   Dr. Gala Romney :schatzkis ring 51F, otherwise normal   ESOPHAGOGASTRODUODENOSCOPY N/A 10/29/2013   RMR: Distal esophageal pseudodiverticulum/Nissen fundoplication   ESOPHAGOGASTRODUODENOSCOPY (EGD) WITH PROPOFOL N/A 11/17/2018   Dr. Gala Romney: Normal esophagus status post dilation.  Nonbleeding erosive gastropathy with benign biopsy.   FINGER SURGERY     on right middle, pointer, and index fingers   FISSURECTOMY     several   INCISIONAL HERNIA REPAIR N/A 03/22/2014   Procedure: [name] HERNIORRHAPHY;  Surgeon: Jamesetta So, MD;  Location: [facility] ORS;  Service: General;  Laterality: N/A;   INSERTION OF MESH N/A 03/22/2014   Procedure: INSERTION OF MESH;  Surgeon:  Jamesetta So, MD;  Location: [facility] OR[facility];  Service: General;  Laterality: N/A;   MALONEY DILATION N/A 11/17/2018   Procedure: [name] DILATION;  Surgeon: Daneil Dolin, MD;  Location: [facility];  Service: Endoscopy;  Laterality: N/A;   NISSEN FUNDOPLICATION  [id number]   [address] [place]   PORTACATH PLACEMENT Left 07/12/2021   Procedure: INSERTION PORT-A-CATH;  Surgeon: Virl Cagey, MD;  Location: [facility] OR[facility];  Service: General;  Laterality: Left;   WRIST SURGERY     left    SOCIAL HISTORY:  Social History   Socioeconomic History   Marital status: Widowed    Spouse name: Not on file   Number of children: Not on file   Years of education: Not on file   Highest education level: Not on file  Occupational History   Occupation: disabled  Tobacco Use   Smoking status: Former    Packs/day: 0.25    Years: 19.00    Total pack years: 4.75    Types: Cigarettes    Quit date: 01/09/2007  Years since quitting: 14.8   Smokeless tobacco: Never  Vaping Use   Vaping Use: Never used  Substance and Sexual Activity   Alcohol use: No   Drug use: No   Sexual activity: Never  Other Topics Concern   Not on file  Social History Narrative   1 son-healthy   1 daughter-MVA (drunk-driver)   Social Determinants of Health   Financial Resource Strain: High Risk (11/09/2021)   Overall Financial Resource Strain (CARDIA)    Difficulty of Paying Living Expenses: Very hard  Food Insecurity: Not on file  Transportation Needs: Not on file  Physical Activity: Not on file  Stress: Not on file  Social Connections: Not on file  Intimate Partner Violence: Not on file    FAMILY HISTORY:  Family History  Problem Relation Age of Onset   Colon cancer Father 30   Hypertension Sister    Hypertension Brother    Kidney disease Brother     CURRENT MEDICATIONS:  Current Outpatient Medications  Medication Sig Dispense Refill   albuterol (PROVENTIL HFA;VENTOLIN HFA) 108 (90 BASE) MCG/ACT inhaler Inhale 2 puffs  into the lungs every 6 (six) hours as needed for wheezing or shortness of breath.     albuterol (PROVENTIL) (2.5 MG/3ML) 0.083% nebulizer solution Take 2.5 mg by nebulization every 6 (six) hours as needed for wheezing or shortness of breath.     allopurinol (ZYLOPRIM) 300 MG tablet Take 300 mg by mouth daily.     aspirin 81 MG tablet Take 81 mg by mouth daily.     atorvastatin (LIPITOR) 20 MG tablet Take 20 mg by mouth at bedtime.     B-D UF III MINI PEN NEEDLES 31G X 5 MM MISC Inject into the skin 3 (three) times daily.     budesonide-formoterol (SYMBICORT) 160-4.5 MCG/ACT inhaler Inhale 2 puffs into the lungs in the morning and at bedtime.     CARBOPLATIN IV Inject into the vein every 21 ( twenty-one) days.     clopidogrel (PLAVIX) 75 MG tablet Take 75 mg by mouth daily.     cycloSPORINE (RESTASIS) 0.05 % ophthalmic emulsion Place 1 drop into both eyes 2 (two) times daily.     diphenhydrAMINE (BENADRYL) 25 MG tablet Take 25 mg by mouth daily as needed for allergies.     doxepin (SINEQUAN) 75 MG capsule Take 75 mg by mouth at bedtime.     esomeprazole (NEXIUM) 40 MG capsule Take 40 mg by mouth in the morning and at bedtime.     gabapentin (NEURONTIN) 300 MG capsule Take 2 capsules (600 mg total) by mouth 3 (three) times daily. If 1 capsule 3 times daily is not helping, you can increase to 2 capsules 3 times daily. 180 capsule 1   glipiZIDE (GLUCOTROL) 10 MG tablet Take 20 mg by mouth 2 (two) times daily before a meal.     HYDROcodone bit-homatropine (HYCODAN) 5-1.5 MG/5ML syrup Take 5 mLs by mouth every 6 (six) hours as needed.     hyoscyamine (LEVSIN SL) 0.125 MG SL tablet Place 1 tablet (0.125 mg total) under the tongue every 6 (six) hours as needed for cramping. 90 tablet 0   ibuprofen (ADVIL) 200 MG tablet Take 400 mg by mouth every 6 (six) hours as needed for moderate pain.     Insulin Detemir (LEVEMIR [place]) Inject into the skin.     Lancets (ONETOUCH DELICA PLUS LOVFIE33I) MISC Apply  topically.     loperamide (IMODIUM) 2 MG capsule Take 1 capsule (  2 mg total) by mouth as needed for diarrhea or loose stools. Take 2 capsules after the first loose stool of the day.  For each subsequent loose stool take 1 capsule.  Do not take more than 8 capsules in a day. 60 capsule 0   lubiprostone (AMITIZA) 8 MCG capsule TAKE 1 CAPSULE BY MOUTH  ONCE DAILY WITH BREAKFAST (Patient taking differently: Take 8 mcg by mouth daily as needed for constipation.) 90 capsule 3   magnesium oxide (MAG-OX) 400 (240 Mg) MG tablet Take 1 tablet (400 mg total) by mouth 2 (two) times daily. 60 tablet 3   MELATONIN PO Take 1 capsule by mouth at bedtime as needed (sleep).     methocarbamol (ROBAXIN) 500 MG tablet Take 500 mg by mouth every 6 (six) hours as needed for muscle spasms.     metoprolol (TOPROL-XL) 100 MG 24 hr tablet Take 100 mg by mouth every morning.     nicotine (NICODERM CQ - DOSED IN MG/24 HOURS) 21 mg/24hr patch Place 1 patch (21 mg total) onto the skin daily. 28 patch 0   ONETOUCH ULTRA test strip      PACLITAXEL IV Inject into the vein every 21 ( twenty-one) days.     traMADol (ULTRAM) 50 MG tablet Take 50-100 mg by mouth every 6 (six) hours as needed for moderate pain.     zolpidem (AMBIEN) 5 MG tablet Take 5 mg by mouth at bedtime as needed.     ondansetron (ZOFRAN) 8 MG tablet Take 1 tablet (8 mg total) by mouth every 8 (eight) hours as needed for refractory nausea / vomiting. (Patient not taking: Reported on 11/21/2021) 30 tablet 0   No current facility-administered medications for this visit.   Facility-Administered Medications Ordered in Other Visits  Medication Dose Route Frequency Provider Last Rate Last Admin   0.9 %  sodium chloride infusion   Intravenous Continuous Pennington, Rebekah M, PA-C 500 mL/hr at 09/25/21 1252 New Bag at 09/25/21 1252   diphenhydrAMINE (BENADRYL) 50 MG/ML injection            famotidine (PEPCID) 20-0.9 MG/50ML-% IVPB            palonosetron (ALOXI) 0.25  MG/5ML injection             ALLERGIES:  Allergies  Allergen Reactions   Lisinopril Anaphylaxis    Closing of throat   Aspirin Nausea Only   Propoxyphene N-Acetaminophen Nausea And Vomiting   Zithromax [Azithromycin] Nausea Only   Erythromycin Rash    PHYSICAL EXAM:  Performance status (ECOG): 1 - Symptomatic but completely ambulatory  There were no vitals filed for this visit. Wt Readings from Last 3 Encounters:  11/21/21 164 lb 6.4 oz (74.6 kg)  11/04/21 168 lb 10.4 oz (76.5 kg)  11/01/21 167 lb 3.2 oz (75.8 kg)   Physical Exam Vitals reviewed.  Constitutional:      Appearance: Normal appearance. She is obese.  Cardiovascular:     Rate and Rhythm: Normal rate and regular rhythm.     Pulses: Normal pulses.     Heart sounds: Normal heart sounds.  Pulmonary:     Effort: Pulmonary effort is normal.     Breath sounds: Normal breath sounds.  Neurological:     General: No focal deficit present.     Mental Status: She is alert and oriented to person, place, and time.  Psychiatric:        Mood and Affect: Mood normal.  Behavior: Behavior normal.      LABORATORY DATA:  I have reviewed the labs as listed.     Latest Ref Rng & Units 11/21/2021    2:23 PM 11/05/2021    4:34 AM 11/04/2021    5:54 AM  CBC  WBC 4.0 - 10.5 K/uL 8.4  21.8  18.7   Hemoglobin 12.0 - 15.0 g/dL 9.9  10.4  11.4   Hematocrit 36.0 - 46.0 % 33.0  34.2  37.0   Platelets 150 - 400 K/uL 345  270  305       Latest Ref Rng & Units 11/21/2021    2:23 PM 11/05/2021    4:34 AM 11/04/2021    9:35 PM  CMP  Glucose 70 - 99 mg/dL 171  319  414   BUN 8 - 23 mg/dL 8  14    Creatinine 0.44 - 1.00 mg/dL 0.73  0.57    Sodium 135 - 145 mmol/L 138  137    Potassium 3.5 - 5.1 mmol/L 4.0  4.2    Chloride 98 - 111 mmol/L 105  105    CO2 22 - 32 mmol/L 23  23    Calcium 8.9 - 10.3 mg/dL 9.2  8.8    Total Protein 6.5 - 8.1 g/dL 7.1     Total Bilirubin 0.3 - 1.2 mg/dL 0.5     Alkaline Phos 38 - 126 U/L 91     AST  15 - 41 U/L 15     ALT 0 - 44 U/L 16       DIAGNOSTIC IMAGING:  I have independently reviewed the scans and discussed with the patient. CT CHEST ABDOMEN PELVIS W CONTRAST  Result Date: 11/16/2021 CLINICAL DATA:  Restaging metastatic non-small cell lung cancer. * Tracking Code: BO * EXAM: CT CHEST, ABDOMEN, AND PELVIS WITH CONTRAST TECHNIQUE: Multidetector CT imaging of the chest, abdomen and pelvis was performed following the standard protocol during bolus administration of intravenous contrast. RADIATION DOSE REDUCTION: This exam was performed according to the departmental dose-optimization program which includes automated exposure control, adjustment of the mA and/or kV according to patient size and/or use of iterative reconstruction technique. CONTRAST:  131m OMNIPAQUE IOHEXOL 300 MG/ML  SOLN COMPARISON:  CT scan 07/24/2021 and PET-CT 06/22/2021 FINDINGS: CT CHEST FINDINGS Cardiovascular: The heart is normal in size. No pericardial effusion. Stable aortic and coronary artery calcifications, advanced for age. Mediastinum/Nodes: Persistent mediastinal lymphadenopathy. Right paratracheal and precarinal adenopathy appears stable. The precarinal node measures 17 mm on image 20/2 and is unchanged. Significant progression left hilar adenopathy the and adjacent left lung mass measuring approximately 3.3 x 3.6 cm. Lungs/Pleura: Significant progression of left suprahilar and hilar mass along with significant nodular interstitial changes in the left upper lobe highly suspicious for interstitial spread of tumor. Superimposed infection is also possible. Small left pleural effusion. Musculoskeletal: No breast masses or axillary adenopathy. Stable appearing supraclavicular nodes. No significant bony findings. CT ABDOMEN PELVIS FINDINGS Hepatobiliary: Persistent hepatic metastatic disease. The segment 3 lesion measures a maximum of 4.2 cm and previously measured 4.2 cm. New segment 2 lesion which measures 3.5 cm on  image 46/2. Segment 5 lesion on image 51/2 measures 3 cm and previously measured 2.8 cm. 2.9 cm segment 6 lesion near the IVC on image 50/2 previously measured 2.6 cm. Two small lower segment 6 lesions on image 63/2 were not definitely present on the prior study. 2.5 cm segment 4A lesion on image 42/2 previously measured 15 mm. New 9  mm segment 8 lesion on image 42/2. The gallbladder is surgically absent. No common bile duct dilatation. Pancreas: No mass, inflammation or ductal dilatation. Spleen: Normal size.  No focal lesions. Adrenals/Urinary Tract: The adrenal glands and kidneys are unremarkable. Bladder is unremarkable. Stomach/Bowel: The stomach, duodenum, small bowel and colon are unremarkable. Vascular/Lymphatic: Stable significant age advanced vascular disease. No mesenteric or retroperitoneal mass or adenopathy. Small scattered lymph nodes are stable. Reproductive: The uterus is surgically absent. Both ovaries are still present and appear normal. Other: No pelvic mass or adenopathy. No free pelvic fluid collections. No inguinal mass or adenopathy. No abdominal wall hernia or subcutaneous lesions. Musculoskeletal: No significant bony findings. No worrisome bone lesions. IMPRESSION: 1. Significant progression of left suprahilar and hilar mass with progressive nodular interstitial changes in the left upper lobe highly suspicious for interstitial spread of tumor. Superimposed infection is also possible. 2. Stable mediastinal and supraclavicular lymphadenopathy. 3. Progressive hepatic metastatic disease. 4. No abdominal/pelvic lymphadenopathy. 5. Stable significant age advanced vascular disease. Aortic Atherosclerosis (ICD10-I70.0). Electronically Signed   By: Marijo Sanes M.D.   On: 11/16/2021 10:02   DG CHEST PORT 1 VIEW  Result Date: 11/04/2021 CLINICAL DATA:  Community-acquired pneumonia. EXAM: PORTABLE CHEST 1 VIEW COMPARISON:  07/12/2021. FINDINGS: The heart size and mediastinal contours are stable.  There is atherosclerotic calcification of the aorta. Patchy airspace opacities are noted in the perihilar region on the left and at the left lung base. No effusion or pneumothorax. A left chest port terminates over the superior vena cava. No acute osseous abnormality. IMPRESSION: Patchy airspace disease in the perihilar region on the left and at the left lung base, compatible with history of pneumonia. Electronically Signed   By: Brett Fairy M.D.   On: 11/04/2021 04:23   CT Soft Tissue Neck W Contrast  Result Date: 11/04/2021 CLINICAL DATA:  Initial evaluation for soft tissue swelling, infection suspected. EXAM: CT NECK WITH CONTRAST TECHNIQUE: Multidetector CT imaging of the neck was performed using the standard protocol following the bolus administration of intravenous contrast. RADIATION DOSE REDUCTION: This exam was performed according to the departmental dose-optimization program which includes automated exposure control, adjustment of the mA and/or kV according to patient size and/or use of iterative reconstruction technique. CONTRAST:  87m OMNIPAQUE IOHEXOL 300 MG/ML  SOLN COMPARISON:  Prior CT from 05/24/2021, as well as prior PET-CT from 06/22/2021 and chest CT from 05/24/2021 FINDINGS: Pharynx and larynx: Oral cavity within normal limits. Oropharynx and nasopharynx within normal limits. No retropharyngeal collection or swelling. Negative epiglottis. Hypopharynx and supraglottic larynx within normal limits. Glottis grossly symmetric and within normal limits. Subglottic airway patent clear. Salivary glands: Salivary glands including the parotid and submandibular glands are within normal limits. Thyroid: Normal. Lymph nodes: Prominent left posterior level 5 a node measures up to 1 cm in short axis (series 2, image 32). Multiple additional scattered subcentimeter nodes seen along the left posterior chain. Few scattered prominent left supraclavicular nodes measure up to 1 cm. Enlarged precarinal node  partially visualized, measuring 1.6 cm in short axis (series 2, image 101). Multiple additional scattered subcentimeter nodes noted within the visualized upper mediastinum. No visible necrosis. Associated mild swelling with hazy stranding within the left lateral neck. No right-sided adenopathy. Vascular: Moderate to advanced atheromatous disease seen about the aortic arch, carotid bifurcations and carotid siphons. Normal intravascular enhancement seen throughout the neck. Left-sided Port-A-Cath in place. Limited intracranial: Unremarkable. Visualized orbits: Prior bilateral ocular lens replacement. Otherwise unremarkable. Mastoids and visualized paranasal sinuses: Small  retention cyst noted within the right sphenoid sinus. Visualized paranasal sinuses are otherwise clear. Visualized mastoids and middle ear cavities are well pneumatized and free of fluid. Skeleton: No discrete or worrisome osseous lesions. Patient is edentulous. Congenital segmental fusion of the C6 and C7 vertebral bodies noted. Ossification of the stylohyoid ligaments bilaterally, which can be seen the setting of Eagle syndrome. Upper chest: Emphysematous changes noted within the visualized lungs. Patchy irregular opacities noted within the partially visualized peripheral left upper lobe, suspicious for possible superimposed infiltrates (series 3, image 97). Other: None. IMPRESSION: 1. Scattered prominent and mildly enlarged lymph nodes involving the left posterior cervical chain, left supraclavicular region, and visualized upper mediastinum. Associated mild hazy stranding and swelling within the adjacent left neck. Findings are nonspecific, but concerning for possible nodal metastatic disease given patient history. Reactive adenopathy/lymphadenitis would be the primary differential consideration. No discrete abscess or drainable fluid collection. 2. Patchy irregular opacities within the partially visualized left upper lobe, suspicious for  infection/pneumonia. 3. Aortic Atherosclerosis (ICD10-I70.0) and Emphysema (ICD10-J43.9). Electronically Signed   By: Jeannine Boga M.D.   On: 11/04/2021 00:21     ASSESSMENT:  Liver masses/mediastinal and left supraclavicular adenopathy: - She has felt left supraclavicular lymph node since November 2022. - She reported 26 pound weight loss in the last 6 to 8 months.  She reports she has been on Ozempic for the last 8 months.  Denies any fevers or night sweats although reports severe tiredness which is worsening.  No new pains. - She had a hysterectomy at age 77 due to cancer.  Unclear uterine or cervical.  Did not require any further therapies. - EGD/colonoscopy on 11/17/2018: Nonbleeding internal hemorrhoids, normal colon, normal esophagus, nonbleeding erosive gastropathy, normal duodenum. - CT soft tissue neck (05/24/2021): Cluster of mildly prominent lymph nodes in the lower neck bilaterally left greater than right. - CT CAP (05/24/2021): Mediastinal and left hilar adenopathy.  Numerous enhancing low-density lesions throughout the liver measuring up to 4.1 cm. - Liver biopsy (06/22/2021): Poorly differentiated carcinoma with necrosis.  IHC strong positivity for CK5/6, p63, PAX8.  Ki-67 with high proliferative rate.  Negative for CK7, CD56, CK20, chromogranin, synaptophysin, TTF-1, Napsin A, marked 31, ER, GATA3 and CDX2.  Differential includes renal or gynecological primary. - PET scan (06/22/2021): Hypermetabolic lymphadenopathy throughout the neck, chest, liver metastasis, 9 mm hypermetabolic lesion in the lower pole of the left kidney suspicious for small RCC. - Left supraclavicular lymph node biopsy: Metastatic poorly differentiated carcinoma, suggestive of squamous cell carcinoma.  Tumor positive for CK5/6 and p63. - NGS testing: HER2 negative.  T p53 pathogenic variant present.  TMB-low.  MSI-stable.  LOH-high at 23%.  PD-L1 (QP619) negative. - NGS test suggest 97% probability of squamous  cell carcinoma. - Based on imaging, this appears most likely squamous cell carcinoma of the lung origin.    Social/family history: - She lives at home with her son and daughter-in-law.  She has been on disability secondary to IBS since 2001.  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  She is a current active smoker, 1/3 pack/day for the last 30 years.   PLAN:  Metastatic squamous cell lung cancer to the liver: - She has reported more dyspnea on exertion today.  Saturations were 90%.  Auscultation reveals bilateral wheezing.  Will give albuterol nebulizer treatment. - She had more pains in the knees and hips after paclitaxel last cycle.  She will take tramadol.  We  will continue paclitaxel at the same dose of 130 mg/m2. - Reviewed labs today which showed normal LFTs and creatinine.  CBC was grossly normal with hemoglobin 9.9. - CT scan 11/04/2021 unfortunately showed progression of disease in the left upper lobe and the liver.  At this time we will discontinue current chemo regimen.  Plan return to clinic in 1 week for discussion of treatment options  2.  Intermittent nausea: - Continue Compazine as needed.  3.  Rheumatoid arthritis of hands and knees (13 years): - Previously treated with methotrexate and Humira.  We are holding immunotherapy due to possible reactivation. - RA symptoms improved after starting chemotherapy.  4.  Neuropathy (numbness in the toes and fingertips): - Continue gabapentin 3 times daily.  Have advised her she can try taking extra gabapentin at night. - She also has some pains in the knees and hips which stayed all 3 weeks after last cycle, Taxol induced.  Recommend taking tramadol 50 mg twice daily.  She has had minimal relief with tramadol -if pain persist after this additional week will consider stronger pain regimen.        5.  Hypomagnesemia:        - Magnesium today 1.6 -she is on oral magnesium.  We will continue to monitor  and supplement with 2 g IV magnesium next week.   Orders placed this encounter:  No orders of the defined types were placed in this encounter.     Burns Spain, Memory Argue, NP-C [place] [phone]

## 2021-11-17 ENCOUNTER — Other Ambulatory Visit: Payer: Self-pay

## 2021-11-17 DIAGNOSIS — R1310 Dysphagia, unspecified: Secondary | ICD-10-CM

## 2021-11-17 DIAGNOSIS — C3490 Malignant neoplasm of unspecified part of unspecified bronchus or lung: Secondary | ICD-10-CM

## 2021-11-17 NOTE — Progress Notes (Signed)
Order placed for speech therapy referral due to dysphagia.

## 2021-11-21 ENCOUNTER — Inpatient Hospital Stay (HOSPITAL_BASED_OUTPATIENT_CLINIC_OR_DEPARTMENT_OTHER): Payer: Medicare Other | Admitting: Nurse Practitioner

## 2021-11-21 ENCOUNTER — Inpatient Hospital Stay: Payer: Medicare Other

## 2021-11-21 DIAGNOSIS — Z90710 Acquired absence of both cervix and uterus: Secondary | ICD-10-CM | POA: Diagnosis not present

## 2021-11-21 DIAGNOSIS — Z09 Encounter for follow-up examination after completed treatment for conditions other than malignant neoplasm: Secondary | ICD-10-CM

## 2021-11-21 DIAGNOSIS — I10 Essential (primary) hypertension: Secondary | ICD-10-CM | POA: Insufficient documentation

## 2021-11-21 DIAGNOSIS — R1310 Dysphagia, unspecified: Secondary | ICD-10-CM | POA: Diagnosis not present

## 2021-11-21 DIAGNOSIS — Z800 Family history of malignant neoplasm of digestive organs: Secondary | ICD-10-CM | POA: Insufficient documentation

## 2021-11-21 DIAGNOSIS — Z79899 Other long term (current) drug therapy: Secondary | ICD-10-CM | POA: Diagnosis not present

## 2021-11-21 DIAGNOSIS — C3490 Malignant neoplasm of unspecified part of unspecified bronchus or lung: Secondary | ICD-10-CM | POA: Insufficient documentation

## 2021-11-21 DIAGNOSIS — Z5111 Encounter for antineoplastic chemotherapy: Secondary | ICD-10-CM | POA: Insufficient documentation

## 2021-11-21 DIAGNOSIS — C787 Secondary malignant neoplasm of liver and intrahepatic bile duct: Secondary | ICD-10-CM | POA: Insufficient documentation

## 2021-11-21 DIAGNOSIS — T451X5A Adverse effect of antineoplastic and immunosuppressive drugs, initial encounter: Secondary | ICD-10-CM | POA: Diagnosis not present

## 2021-11-21 DIAGNOSIS — R000 Tachycardia, unspecified: Secondary | ICD-10-CM | POA: Diagnosis not present

## 2021-11-21 DIAGNOSIS — K589 Irritable bowel syndrome without diarrhea: Secondary | ICD-10-CM | POA: Insufficient documentation

## 2021-11-21 DIAGNOSIS — Z95828 Presence of other vascular implants and grafts: Secondary | ICD-10-CM

## 2021-11-21 DIAGNOSIS — Z5189 Encounter for other specified aftercare: Secondary | ICD-10-CM | POA: Insufficient documentation

## 2021-11-21 DIAGNOSIS — R110 Nausea: Secondary | ICD-10-CM | POA: Diagnosis not present

## 2021-11-21 DIAGNOSIS — G620 Drug-induced polyneuropathy: Secondary | ICD-10-CM | POA: Insufficient documentation

## 2021-11-21 DIAGNOSIS — M069 Rheumatoid arthritis, unspecified: Secondary | ICD-10-CM | POA: Diagnosis not present

## 2021-11-21 DIAGNOSIS — F17200 Nicotine dependence, unspecified, uncomplicated: Secondary | ICD-10-CM | POA: Insufficient documentation

## 2021-11-21 LAB — COMPREHENSIVE METABOLIC PANEL
ALT: 16 U/L (ref 0–44)
AST: 15 U/L (ref 15–41)
Albumin: 2.5 g/dL — ABNORMAL LOW (ref 3.5–5.0)
Alkaline Phosphatase: 91 U/L (ref 38–126)
Anion gap: 10 (ref 5–15)
BUN: 8 mg/dL (ref 8–23)
CO2: 23 mmol/L (ref 22–32)
Calcium: 9.2 mg/dL (ref 8.9–10.3)
Chloride: 105 mmol/L (ref 98–111)
Creatinine, Ser: 0.73 mg/dL (ref 0.44–1.00)
GFR, Estimated: >60 mL/min (ref >60)
Glucose, Bld: 171 mg/dL — ABNORMAL HIGH (ref 70–99)
Potassium: 4 mmol/L (ref 3.5–5.1)
Sodium: 138 mmol/L (ref 135–145)
Total Bilirubin: 0.5 mg/dL (ref 0.3–1.2)
Total Protein: 7.1 g/dL (ref 6.5–8.1)

## 2021-11-21 LAB — CBC WITH DIFFERENTIAL/PLATELET
Abs Immature Granulocytes: 0.04 10*3/uL (ref 0.00–0.07)
Basophils Absolute: 0.1 10*3/uL (ref 0.0–0.1)
Basophils Relative: 1 %
Eosinophils Absolute: 0.1 10*3/uL (ref 0.0–0.5)
Eosinophils Relative: 1 %
HCT: 33 % — ABNORMAL LOW (ref 36.0–46.0)
Hemoglobin: 9.9 g/dL — ABNORMAL LOW (ref 12.0–15.0)
Immature Granulocytes: 1 %
Lymphocytes Relative: 23 %
Lymphs Abs: 1.9 10*3/uL (ref 0.7–4.0)
MCH: 25.8 pg — ABNORMAL LOW (ref 26.0–34.0)
MCHC: 30 g/dL (ref 30.0–36.0)
MCV: 86.2 fL (ref 80.0–100.0)
Monocytes Absolute: 0.8 10*3/uL (ref 0.1–1.0)
Monocytes Relative: 10 %
Neutro Abs: 5.5 10*3/uL (ref 1.7–7.7)
Neutrophils Relative %: 64 %
Platelets: 345 10*3/uL (ref 150–400)
RBC: 3.83 MIL/uL — ABNORMAL LOW (ref 3.87–5.11)
RDW: 21 % — ABNORMAL HIGH (ref 11.5–15.5)
WBC: 8.4 10*3/uL (ref 4.0–10.5)
nRBC: 0 % (ref 0.0–0.2)

## 2021-11-21 LAB — MAGNESIUM: Magnesium: 1.6 mg/dL — ABNORMAL LOW (ref 1.7–2.4)

## 2021-11-21 MED ORDER — HEPARIN SOD (PORK) LOCK FLUSH 100 UNIT/ML IV SOLN
500.0000 [IU] | Freq: Once | INTRAVENOUS | Status: AC
  Administered 2021-11-21: 500 [IU] via INTRAVENOUS

## 2021-11-21 MED ORDER — SODIUM CHLORIDE 0.9% FLUSH
10.0000 mL | Freq: Once | INTRAVENOUS | Status: AC
  Administered 2021-11-21: 10 mL via INTRAVENOUS

## 2021-11-21 NOTE — Progress Notes (Signed)
Patient presents today for port flush and lab with follow up visit with Durenda Age PA. Vital signs stable. Patient left accessed for treatment 11-22-2021 per patient's request. Biopatch in place and dressing secure x 2. No complaints at this time. Discharged from clinic by wheel chair in stable condition. Alert and oriented x 3. F/U with [facility] as scheduled.

## 2021-11-21 NOTE — Patient Instructions (Signed)
[facility]  Discharge Instructions: Thank you for choosing [facility] to provide your oncology and hematology care.  If you have a lab appointment with the [facility], please come in thru the Main Entrance and check in at the main information desk.  Wear comfortable clothing and clothing appropriate for easy access to any Portacath or PICC line.   We strive to give you quality time with your provider. You may need to reschedule your appointment if you arrive late (15 or more minutes).  Arriving late affects you and other patients whose appointments are after yours.  Also, if you miss three or more appointments without notifying the office, you may be dismissed from the clinic at the provider's discretion.      For prescription refill requests, have your pharmacy contact our office and allow 72 hours for refills to be completed.    Today you received the following : port flush with labs and follow up visit .       To help prevent nausea and vomiting after your treatment, we encourage you to take your nausea medication as directed.  BELOW ARE SYMPTOMS THAT SHOULD BE REPORTED IMMEDIATELY: *FEVER GREATER THAN 100.4 F (38 C) OR HIGHER *CHILLS OR SWEATING *NAUSEA AND VOMITING THAT IS NOT CONTROLLED WITH YOUR NAUSEA MEDICATION *UNUSUAL SHORTNESS OF BREATH *UNUSUAL BRUISING OR BLEEDING *URINARY PROBLEMS (pain or burning when urinating, or frequent urination) *BOWEL PROBLEMS (unusual diarrhea, constipation, pain near the anus) TENDERNESS IN MOUTH AND THROAT WITH OR WITHOUT PRESENCE OF ULCERS (sore throat, sores in mouth, or a toothache) UNUSUAL RASH, SWELLING OR PAIN  UNUSUAL VAGINAL DISCHARGE OR ITCHING   Items with * indicate a potential emergency and should be followed up as soon as possible or go to the Emergency Department if any problems should occur.  Please show the CHEMOTHERAPY ALERT CARD or IMMUNOTHERAPY ALERT CARD at check-in to the Emergency  Department and triage nurse.  Should you have questions after your visit or need to cancel or reschedule your appointment, please contact [facility] [phone]  and follow the prompts.  Office hours are 8:00 a.m. to 4:30 p.m. Monday - Friday. Please note that voicemails left after 4:00 p.m. may not be returned until the following business day.  We are closed weekends and major holidays. You have access to a nurse at all times for urgent questions. Please call the main number to the clinic [phone] and follow the prompts.  For any non-urgent questions, you may also contact your provider using MyChart. We now offer e-Visits for anyone 28 and older to request care online for non-urgent symptoms. For details visit [email or web address].   Also download the MyChart app! Go to the app store, search "MyChart", open the app, select [facility], and log in with your MyChart username and password.  Masks are optional in the cancer centers. If you would like for your care team to wear a mask while they are taking care of you, please let them know. You may have one support person who is at least 68 years old accompany you for your appointments.

## 2021-11-22 ENCOUNTER — Inpatient Hospital Stay: Payer: Medicare Other

## 2021-11-22 VITALS — BP 103/65 | HR 80 | Temp 97.0°F | Resp 20

## 2021-11-22 DIAGNOSIS — R110 Nausea: Secondary | ICD-10-CM | POA: Diagnosis not present

## 2021-11-22 DIAGNOSIS — T451X5A Adverse effect of antineoplastic and immunosuppressive drugs, initial encounter: Secondary | ICD-10-CM | POA: Diagnosis not present

## 2021-11-22 DIAGNOSIS — M069 Rheumatoid arthritis, unspecified: Secondary | ICD-10-CM | POA: Diagnosis not present

## 2021-11-22 DIAGNOSIS — C3490 Malignant neoplasm of unspecified part of unspecified bronchus or lung: Secondary | ICD-10-CM

## 2021-11-22 DIAGNOSIS — Z800 Family history of malignant neoplasm of digestive organs: Secondary | ICD-10-CM | POA: Diagnosis not present

## 2021-11-22 DIAGNOSIS — R000 Tachycardia, unspecified: Secondary | ICD-10-CM | POA: Diagnosis not present

## 2021-11-22 DIAGNOSIS — Z95828 Presence of other vascular implants and grafts: Secondary | ICD-10-CM

## 2021-11-22 DIAGNOSIS — R1310 Dysphagia, unspecified: Secondary | ICD-10-CM | POA: Diagnosis not present

## 2021-11-22 DIAGNOSIS — G620 Drug-induced polyneuropathy: Secondary | ICD-10-CM | POA: Diagnosis not present

## 2021-11-22 DIAGNOSIS — C787 Secondary malignant neoplasm of liver and intrahepatic bile duct: Secondary | ICD-10-CM | POA: Diagnosis not present

## 2021-11-22 DIAGNOSIS — K589 Irritable bowel syndrome without diarrhea: Secondary | ICD-10-CM | POA: Diagnosis not present

## 2021-11-22 DIAGNOSIS — I10 Essential (primary) hypertension: Secondary | ICD-10-CM | POA: Diagnosis not present

## 2021-11-22 DIAGNOSIS — F17200 Nicotine dependence, unspecified, uncomplicated: Secondary | ICD-10-CM | POA: Diagnosis not present

## 2021-11-22 DIAGNOSIS — Z79899 Other long term (current) drug therapy: Secondary | ICD-10-CM | POA: Diagnosis not present

## 2021-11-22 DIAGNOSIS — Z5189 Encounter for other specified aftercare: Secondary | ICD-10-CM | POA: Diagnosis not present

## 2021-11-22 DIAGNOSIS — Z5111 Encounter for antineoplastic chemotherapy: Secondary | ICD-10-CM | POA: Diagnosis not present

## 2021-11-22 MED ORDER — SODIUM CHLORIDE 0.9 % IV SOLN: INTRAVENOUS | Status: DC

## 2021-11-22 MED ORDER — SODIUM CHLORIDE 0.9% FLUSH
10.0000 mL | INTRAVENOUS | Status: DC | PRN
  Administered 2021-11-22 (×2): 10 mL via INTRAVENOUS

## 2021-11-22 MED ORDER — HEPARIN SOD (PORK) LOCK FLUSH 100 UNIT/ML IV SOLN
500.0000 [IU] | Freq: Once | INTRAVENOUS | Status: AC
  Administered 2021-11-22: 500 [IU] via INTRAVENOUS

## 2021-11-22 NOTE — Patient Instructions (Signed)
[facility]  Discharge Instructions: Thank you for choosing [facility] to provide your oncology and hematology care.  If you have a lab appointment with the [facility], please come in thru the Main Entrance and check in at the main information desk.  Wear comfortable clothing and clothing appropriate for easy access to any Portacath or PICC line.   We strive to give you quality time with your provider. You may need to reschedule your appointment if you arrive late (15 or more minutes).  Arriving late affects you and other patients whose appointments are after yours.  Also, if you miss three or more appointments without notifying the office, you may be dismissed from the clinic at the provider's discretion.      For prescription refill requests, have your pharmacy contact our office and allow 72 hours for refills to be completed.    Today you received the following 1 liter of normal saline, return as scheduled.   To help prevent nausea and vomiting after your treatment, we encourage you to take your nausea medication as directed.  BELOW ARE SYMPTOMS THAT SHOULD BE REPORTED IMMEDIATELY: *FEVER GREATER THAN 100.4 F (38 C) OR HIGHER *CHILLS OR SWEATING *NAUSEA AND VOMITING THAT IS NOT CONTROLLED WITH YOUR NAUSEA MEDICATION *UNUSUAL SHORTNESS OF BREATH *UNUSUAL BRUISING OR BLEEDING *URINARY PROBLEMS (pain or burning when urinating, or frequent urination) *BOWEL PROBLEMS (unusual diarrhea, constipation, pain near the anus) TENDERNESS IN MOUTH AND THROAT WITH OR WITHOUT PRESENCE OF ULCERS (sore throat, sores in mouth, or a toothache) UNUSUAL RASH, SWELLING OR PAIN  UNUSUAL VAGINAL DISCHARGE OR ITCHING   Items with * indicate a potential emergency and should be followed up as soon as possible or go to the Emergency Department if any problems should occur.  Please show the CHEMOTHERAPY ALERT CARD or IMMUNOTHERAPY ALERT CARD at check-in to the Emergency  Department and triage nurse.  Should you have questions after your visit or need to cancel or reschedule your appointment, please contact [facility] [phone]  and follow the prompts.  Office hours are 8:00 a.m. to 4:30 p.m. Monday - Friday. Please note that voicemails left after 4:00 p.m. may not be returned until the following business day.  We are closed weekends and major holidays. You have access to a nurse at all times for urgent questions. Please call the main number to the clinic [phone] and follow the prompts.  For any non-urgent questions, you may also contact your provider using MyChart. We now offer e-Visits for anyone 45 and older to request care online for non-urgent symptoms. For details visit [email or web address].   Also download the MyChart app! Go to the app store, search "MyChart", open the app, select [facility], and log in with your MyChart username and password.  Masks are optional in the cancer centers. If you would like for your care team to wear a mask while they are taking care of you, please let them know. You may have one support person who is at least 68 years old accompany you for your appointments.

## 2021-11-22 NOTE — Progress Notes (Signed)
Patient presents today for 1 L of normal saline per Burns Spain NP.  Patient tolerated hydration therapy with no complaints voiced. Side effects with management reviewed with understanding verbalized. Port site clean and dry with no bruising or swelling noted at site. Good blood return noted before and after administration of therapy. Band aid applied. Patient left in satisfactory condition with VSS and no s/s of distress noted.

## 2021-11-24 ENCOUNTER — Inpatient Hospital Stay: Payer: Medicare Other

## 2021-11-27 ENCOUNTER — Inpatient Hospital Stay (HOSPITAL_BASED_OUTPATIENT_CLINIC_OR_DEPARTMENT_OTHER): Payer: Medicare Other | Admitting: Hematology

## 2021-11-27 DIAGNOSIS — Z79899 Other long term (current) drug therapy: Secondary | ICD-10-CM | POA: Diagnosis not present

## 2021-11-27 DIAGNOSIS — K589 Irritable bowel syndrome without diarrhea: Secondary | ICD-10-CM | POA: Diagnosis not present

## 2021-11-27 DIAGNOSIS — C787 Secondary malignant neoplasm of liver and intrahepatic bile duct: Secondary | ICD-10-CM | POA: Diagnosis not present

## 2021-11-27 DIAGNOSIS — T451X5A Adverse effect of antineoplastic and immunosuppressive drugs, initial encounter: Secondary | ICD-10-CM | POA: Diagnosis not present

## 2021-11-27 DIAGNOSIS — R000 Tachycardia, unspecified: Secondary | ICD-10-CM | POA: Diagnosis not present

## 2021-11-27 DIAGNOSIS — F17200 Nicotine dependence, unspecified, uncomplicated: Secondary | ICD-10-CM | POA: Diagnosis not present

## 2021-11-27 DIAGNOSIS — I10 Essential (primary) hypertension: Secondary | ICD-10-CM | POA: Diagnosis not present

## 2021-11-27 DIAGNOSIS — Z800 Family history of malignant neoplasm of digestive organs: Secondary | ICD-10-CM | POA: Diagnosis not present

## 2021-11-27 DIAGNOSIS — G620 Drug-induced polyneuropathy: Secondary | ICD-10-CM | POA: Diagnosis not present

## 2021-11-27 DIAGNOSIS — Z95828 Presence of other vascular implants and grafts: Secondary | ICD-10-CM | POA: Diagnosis not present

## 2021-11-27 DIAGNOSIS — C3490 Malignant neoplasm of unspecified part of unspecified bronchus or lung: Secondary | ICD-10-CM

## 2021-11-27 DIAGNOSIS — R1310 Dysphagia, unspecified: Secondary | ICD-10-CM | POA: Diagnosis not present

## 2021-11-27 DIAGNOSIS — Z5111 Encounter for antineoplastic chemotherapy: Secondary | ICD-10-CM | POA: Diagnosis not present

## 2021-11-27 DIAGNOSIS — R110 Nausea: Secondary | ICD-10-CM | POA: Diagnosis not present

## 2021-11-27 DIAGNOSIS — M069 Rheumatoid arthritis, unspecified: Secondary | ICD-10-CM | POA: Diagnosis not present

## 2021-11-27 DIAGNOSIS — Z5189 Encounter for other specified aftercare: Secondary | ICD-10-CM | POA: Diagnosis not present

## 2021-11-27 MED ORDER — METOPROLOL SUCCINATE ER 50 MG PO TB24: 50.0000 mg | ORAL_TABLET | Freq: Every morning | ORAL | 1 refills | Status: DC

## 2021-11-27 NOTE — Progress Notes (Signed)
[facility] [address], [place] [address]   CLINIC:  Medical Oncology/Hematology  PCP:  Ignatius Specking, MD [address] / [place] [address] [phone]   REASON FOR VISIT:  Follow-up for metastatic squamous cell lung cancer to the liver  PRIOR THERAPY: none  NGS Results: PD-L1 negative, no targetable mutations.  CURRENT THERAPY: Carboplatin and paclitaxel  BRIEF ONCOLOGIC HISTORY:  Oncology History  Squamous cell lung cancer, unspecified laterality (HCC)  09/13/2021 Initial Diagnosis   Squamous cell lung cancer, unspecified laterality (HCC)   09/20/2021 - 10/13/2021 Chemotherapy   Patient is on Treatment Plan : LUNG NSCLC Carboplatin / Paclitaxel q21d x 6 cycles     09/20/2021 - 11/03/2021 Chemotherapy   Patient is on Treatment Plan : LUNG NSCLC Carboplatin + Paclitaxel q21d X 6 Cycles     11/29/2021 -  Chemotherapy   Patient is on Treatment Plan : LUNG Docetaxel + Ramucirumab q21d        CANCER STAGING:  Cancer Staging  Squamous cell lung cancer, unspecified laterality (HCC) Staging form: Lung, AJCC 8th Edition - Clinical stage from 09/13/2021: Stage IVB (cTX, cN3, pM1c) - Unsigned  INTERVAL HISTORY:  Brittney Martinez, a 66 y.o. female, seen for follow-up of metastatic squamous cell carcinoma, presumed to be from lung primary.  She has completed 3 cycles of carboplatin and paclitaxel.  She had a CT CAP done on 11/15/2021.  Metoprolol has been on hold since last week due to hypotension.  Blood pressure today is better at 124/75, however she has tachycardia with pulse rate of 140.  She has shortness of breath on minimal exertion.  She also reported some dysphagia mostly to solid foods.  She is drinking about 3 cans of Ensure per day and eating 2 meals per day.  REVIEW OF SYSTEMS:  Review of Systems  Constitutional:  Negative for appetite change and fatigue.  HENT:   Positive for trouble swallowing.   Respiratory:  Positive for cough and shortness of  breath.   Gastrointestinal:  Positive for constipation and nausea.  Neurological:  Positive for numbness (Constant numbness in the toes and fingertips).  All other systems reviewed and are negative.   PAST MEDICAL/SURGICAL HISTORY:  Past Medical History:  Diagnosis Date   Abdominal aortic stenosis    Anal fissure    Anxiety    Asthma    Complication of anesthesia    COPD (chronic obstructive pulmonary disease) (HCC)    Depression    grief   Diabetes mellitus    Diverticulitis    GERD (gastroesophageal reflux disease)    Glaucoma    Gout    Hypertension    IBS (irritable bowel syndrome)    PONV (postoperative nausea and vomiting)    Port-A-Cath in place 09/19/2021   RA (rheumatoid arthritis) (HCC)    Sleep apnea    had one at one time but not anymore   Stroke (HCC)    "mini-stroke"- no deficits   TIA (transient ischemic attack)    2010. No deficits   Past Surgical History:  Procedure Laterality Date   ABDOMINAL HYSTERECTOMY  1982   complete   BIOPSY  11/17/2018   Procedure: BIOPSY;  Surgeon: Corbin Ade, MD;  Location: [facility];  Service: Endoscopy;;   BREAST LUMPECTOMY     right-benign   CATARACT EXTRACTION W/PHACO Right 05/13/2017   Procedure: CATARACT EXTRACTION PHACO AND INTRAOCULAR LENS PLACEMENT (IOC);  Surgeon: Gemma Payor, MD;  Location: [facility];  Service: Ophthalmology;  Laterality: Right;  CDE: 8.59   CATARACT EXTRACTION W/PHACO Left 05/27/2017   Procedure: CATARACT EXTRACTION WITH  PHACOEMULSIFICATION AND INTRAOCULAR LENS PLACEMENT LEFT EYE;  Surgeon: Tonny Branch, MD;  Location: [facility];  Service: Ophthalmology;  Laterality: Left;  CDE: 5.66   CHOLECYSTECTOMY  2003   COLONOSCOPY  12/14/09   Dr. Gala Romney :anal papilla and hemorrhoids,diminutive hperplastic rectal polyps/normal colon   COLONOSCOPY N/A 10/29/2013   [name] papilla and internal hemorrhoids; colonic polyps-removed as described above. I suspect benign anorectal bleeding in the setting of  hemorrhoids and  possibly fissure. tubular adenoma. next TCS 10/2020.   COLONOSCOPY WITH PROPOFOL N/A 11/17/2018   Dr. Gala Romney: Nonbleeding internal hemorrhoids, hemorrhoids were moderate and grade.  Exam otherwise normal.  Colonoscopy in 5 years.   ELBOW SURGERY     right   ESOPHAGOGASTRODUODENOSCOPY  12/14/09   Dr. Gala Romney :schatzkis ring 87F, otherwise normal   ESOPHAGOGASTRODUODENOSCOPY N/A 10/29/2013   RMR: Distal esophageal pseudodiverticulum/Nissen fundoplication   ESOPHAGOGASTRODUODENOSCOPY (EGD) WITH PROPOFOL N/A 11/17/2018   Dr. Gala Romney: Normal esophagus status post dilation.  Nonbleeding erosive gastropathy with benign biopsy.   FINGER SURGERY     on right middle, pointer, and index fingers   FISSURECTOMY     several   INCISIONAL HERNIA REPAIR N/A 03/22/2014   Procedure: [name] HERNIORRHAPHY;  Surgeon: Jamesetta So, MD;  Location: [facility];  Service: General;  Laterality: N/A;   INSERTION OF MESH N/A 03/22/2014   Procedure: INSERTION OF MESH;  Surgeon: Jamesetta So, MD;  Location: [facility];  Service: General;  Laterality: N/A;   MALONEY DILATION N/A 11/17/2018   Procedure: [name] DILATION;  Surgeon: Daneil Dolin, MD;  Location: [facility];  Service: Endoscopy;  Laterality: N/A;   NISSEN FUNDOPLICATION  [id number]   [name]   PORTACATH PLACEMENT Left 07/12/2021   Procedure: INSERTION PORT-A-CATH;  Surgeon: Virl Cagey, MD;  Location: [facility];  Service: General;  Laterality: Left;   WRIST SURGERY     left    SOCIAL HISTORY:  Social History   Socioeconomic History   Marital status: Widowed    Spouse name: Not on file   Number of children: Not on file   Years of education: Not on file   Highest education level: Not on file  Occupational History   Occupation: disabled  Tobacco Use   Smoking status: Former    Packs/day: 0.25    Years: 19.00    Total pack years: 4.75    Types: Cigarettes    Quit date: 01/09/2007    Years since quitting: 14.8   Smokeless tobacco:  Never  Vaping Use   Vaping Use: Never used  Substance and Sexual Activity   Alcohol use: No   Drug use: No   Sexual activity: Never  Other Topics Concern   Not on file  Social History Narrative   1 son-healthy   1 daughter-MVA (drunk-driver)   Social Determinants of Health   Financial Resource Strain: High Risk (11/09/2021)   Overall Financial Resource Strain (CARDIA)    Difficulty of Paying Living Expenses: Very hard  Food Insecurity: Not on file  Transportation Needs: Not on file  Physical Activity: Not on file  Stress: Not on file  Social Connections: Not on file  Intimate Partner Violence: Not on file    FAMILY HISTORY:  Family History  Problem Relation Age of Onset   Colon cancer Father 28   Hypertension Sister  Hypertension Brother    Kidney disease Brother     CURRENT MEDICATIONS:  Current Outpatient Medications  Medication Sig Dispense Refill   albuterol (PROVENTIL HFA;VENTOLIN HFA) 108 (90 BASE) MCG/ACT inhaler Inhale 2 puffs into the lungs every 6 (six) hours as needed for wheezing or shortness of breath.     albuterol (PROVENTIL) (2.5 MG/3ML) 0.083% nebulizer solution Take 2.5 mg by nebulization every 6 (six) hours as needed for wheezing or shortness of breath.     allopurinol (ZYLOPRIM) 300 MG tablet Take 300 mg by mouth daily.     aspirin 81 MG tablet Take 81 mg by mouth daily.     atorvastatin (LIPITOR) 20 MG tablet Take 20 mg by mouth at bedtime.     B-D UF III MINI PEN NEEDLES 31G X 5 MM MISC Inject into the skin 3 (three) times daily.     budesonide-formoterol (SYMBICORT) 160-4.5 MCG/ACT inhaler Inhale 2 puffs into the lungs in the morning and at bedtime.     CARBOPLATIN IV Inject into the vein every 21 ( twenty-one) days.     clopidogrel (PLAVIX) 75 MG tablet Take 75 mg by mouth daily.     cycloSPORINE (RESTASIS) 0.05 % ophthalmic emulsion Place 1 drop into both eyes 2 (two) times daily.     diphenhydrAMINE (BENADRYL) 25 MG tablet Take 25 mg by mouth  daily as needed for allergies.     doxepin (SINEQUAN) 75 MG capsule Take 75 mg by mouth at bedtime.     esomeprazole (NEXIUM) 40 MG capsule Take 40 mg by mouth in the morning and at bedtime.     gabapentin (NEURONTIN) 300 MG capsule Take 2 capsules (600 mg total) by mouth 3 (three) times daily. If 1 capsule 3 times daily is not helping, you can increase to 2 capsules 3 times daily. 180 capsule 1   glipiZIDE (GLUCOTROL) 10 MG tablet Take 20 mg by mouth 2 (two) times daily before a meal.     HYDROcodone bit-homatropine (HYCODAN) 5-1.5 MG/5ML syrup Take 5 mLs by mouth every 6 (six) hours as needed.     hyoscyamine (LEVSIN SL) 0.125 MG SL tablet Place 1 tablet (0.125 mg total) under the tongue every 6 (six) hours as needed for cramping. 90 tablet 0   ibuprofen (ADVIL) 200 MG tablet Take 400 mg by mouth every 6 (six) hours as needed for moderate pain.     Insulin Detemir (LEVEMIR Flanagan) Inject into the skin.     Lancets (ONETOUCH DELICA PLUS [id number]) MISC Apply topically.     loperamide (IMODIUM) 2 MG capsule Take 1 capsule (2 mg total) by mouth as needed for diarrhea or loose stools. Take 2 capsules after the first loose stool of the day.  For each subsequent loose stool take 1 capsule.  Do not take more than 8 capsules in a day. 60 capsule 0   lubiprostone (AMITIZA) 8 MCG capsule TAKE 1 CAPSULE BY MOUTH  ONCE DAILY WITH BREAKFAST (Patient taking differently: Take 8 mcg by mouth daily as needed for constipation.) 90 capsule 3   magnesium oxide (MAG-OX) 400 (240 Mg) MG tablet Take 1 tablet (400 mg total) by mouth 2 (two) times daily. 60 tablet 3   MELATONIN PO Take 1 capsule by mouth at bedtime as needed (sleep).     methocarbamol (ROBAXIN) 500 MG tablet Take 500 mg by mouth every 6 (six) hours as needed for muscle spasms.     nicotine (NICODERM CQ - DOSED IN MG/24 HOURS) 21 mg/24hr  patch Place 1 patch (21 mg total) onto the skin daily. 28 patch 0   ondansetron (ZOFRAN) 8 MG tablet Take 1 tablet (8 mg  total) by mouth every 8 (eight) hours as needed for refractory nausea / vomiting. 30 tablet 0   ONETOUCH ULTRA test strip      PACLITAXEL IV Inject into the vein every 21 ( twenty-one) days.     traMADol (ULTRAM) 50 MG tablet Take 50-100 mg by mouth every 6 (six) hours as needed for moderate pain.     zolpidem (AMBIEN) 5 MG tablet Take 5 mg by mouth at bedtime as needed.     metoprolol succinate (TOPROL-XL) 50 MG 24 hr tablet Take 1 tablet (50 mg total) by mouth every morning. 30 tablet 1   No current facility-administered medications for this visit.   Facility-Administered Medications Ordered in Other Visits  Medication Dose Route Frequency Provider Last Rate Last Admin   0.9 %  sodium chloride infusion   Intravenous Continuous Pennington, Rebekah M, PA-C 500 mL/hr at 09/25/21 1252 New Bag at 09/25/21 1252   diphenhydrAMINE (BENADRYL) 50 MG/ML injection            famotidine (PEPCID) 20-0.9 MG/50ML-% IVPB            palonosetron (ALOXI) 0.25 MG/5ML injection             ALLERGIES:  Allergies  Allergen Reactions   Lisinopril Anaphylaxis    Closing of throat   Aspirin Nausea Only   Propoxyphene N-Acetaminophen Nausea And Vomiting   Zithromax [Azithromycin] Nausea Only   Erythromycin Rash    PHYSICAL EXAM:  Performance status (ECOG): 1 - Symptomatic but completely ambulatory  There were no vitals filed for this visit. Wt Readings from Last 3 Encounters:  11/21/21 164 lb 6.4 oz (74.6 kg)  11/04/21 168 lb 10.4 oz (76.5 kg)  11/01/21 167 lb 3.2 oz (75.8 kg)   Physical Exam Vitals reviewed.  Constitutional:      Appearance: Normal appearance. She is obese.  Cardiovascular:     Rate and Rhythm: Normal rate and regular rhythm.     Pulses: Normal pulses.     Heart sounds: Normal heart sounds.  Pulmonary:     Effort: Pulmonary effort is normal.     Breath sounds: Normal breath sounds.  Neurological:     General: No focal deficit present.     Mental Status: She is alert and  oriented to person, place, and time.  Psychiatric:        Mood and Affect: Mood normal.        Behavior: Behavior normal.      LABORATORY DATA:  I have reviewed the labs as listed.     Latest Ref Rng & Units 11/21/2021    2:23 PM 11/05/2021    4:34 AM 11/04/2021    5:54 AM  CBC  WBC 4.0 - 10.5 K/uL 8.4  21.8  18.7   Hemoglobin 12.0 - 15.0 g/dL 9.9  10.4  11.4   Hematocrit 36.0 - 46.0 % 33.0  34.2  37.0   Platelets 150 - 400 K/uL 345  270  305       Latest Ref Rng & Units 11/21/2021    2:23 PM 11/05/2021    4:34 AM 11/04/2021    9:35 PM  CMP  Glucose 70 - 99 mg/dL 171  319  414   BUN 8 - 23 mg/dL 8  14    Creatinine 0.44 - 1.00 mg/dL  0.73  0.57    Sodium 135 - 145 mmol/L 138  137    Potassium 3.5 - 5.1 mmol/L 4.0  4.2    Chloride 98 - 111 mmol/L 105  105    CO2 22 - 32 mmol/L 23  23    Calcium 8.9 - 10.3 mg/dL 9.2  8.8    Total Protein 6.5 - 8.1 g/dL 7.1     Total Bilirubin 0.3 - 1.2 mg/dL 0.5     Alkaline Phos 38 - 126 U/L 91     AST 15 - 41 U/L 15     ALT 0 - 44 U/L 16       DIAGNOSTIC IMAGING:  I have independently reviewed the scans and discussed with the patient. CT CHEST ABDOMEN PELVIS W CONTRAST  Result Date: 11/16/2021 CLINICAL DATA:  Restaging metastatic non-small cell lung cancer. * Tracking Code: BO * EXAM: CT CHEST, ABDOMEN, AND PELVIS WITH CONTRAST TECHNIQUE: Multidetector CT imaging of the chest, abdomen and pelvis was performed following the standard protocol during bolus administration of intravenous contrast. RADIATION DOSE REDUCTION: This exam was performed according to the departmental dose-optimization program which includes automated exposure control, adjustment of the mA and/or kV according to patient size and/or use of iterative reconstruction technique. CONTRAST:  161m OMNIPAQUE IOHEXOL 300 MG/ML  SOLN COMPARISON:  CT scan 07/24/2021 and PET-CT 06/22/2021 FINDINGS: CT CHEST FINDINGS Cardiovascular: The heart is normal in size. No pericardial effusion. Stable  aortic and coronary artery calcifications, advanced for age. Mediastinum/Nodes: Persistent mediastinal lymphadenopathy. Right paratracheal and precarinal adenopathy appears stable. The precarinal node measures 17 mm on image 20/2 and is unchanged. Significant progression left hilar adenopathy the and adjacent left lung mass measuring approximately 3.3 x 3.6 cm. Lungs/Pleura: Significant progression of left suprahilar and hilar mass along with significant nodular interstitial changes in the left upper lobe highly suspicious for interstitial spread of tumor. Superimposed infection is also possible. Small left pleural effusion. Musculoskeletal: No breast masses or axillary adenopathy. Stable appearing supraclavicular nodes. No significant bony findings. CT ABDOMEN PELVIS FINDINGS Hepatobiliary: Persistent hepatic metastatic disease. The segment 3 lesion measures a maximum of 4.2 cm and previously measured 4.2 cm. New segment 2 lesion which measures 3.5 cm on image 46/2. Segment 5 lesion on image 51/2 measures 3 cm and previously measured 2.8 cm. 2.9 cm segment 6 lesion near the IVC on image 50/2 previously measured 2.6 cm. Two small lower segment 6 lesions on image 63/2 were not definitely present on the prior study. 2.5 cm segment 4A lesion on image 42/2 previously measured 15 mm. New 9 mm segment 8 lesion on image 42/2. The gallbladder is surgically absent. No common bile duct dilatation. Pancreas: No mass, inflammation or ductal dilatation. Spleen: Normal size.  No focal lesions. Adrenals/Urinary Tract: The adrenal glands and kidneys are unremarkable. Bladder is unremarkable. Stomach/Bowel: The stomach, duodenum, small bowel and colon are unremarkable. Vascular/Lymphatic: Stable significant age advanced vascular disease. No mesenteric or retroperitoneal mass or adenopathy. Small scattered lymph nodes are stable. Reproductive: The uterus is surgically absent. Both ovaries are still present and appear normal. Other:  No pelvic mass or adenopathy. No free pelvic fluid collections. No inguinal mass or adenopathy. No abdominal wall hernia or subcutaneous lesions. Musculoskeletal: No significant bony findings. No worrisome bone lesions. IMPRESSION: 1. Significant progression of left suprahilar and hilar mass with progressive nodular interstitial changes in the left upper lobe highly suspicious for interstitial spread of tumor. Superimposed infection is also possible. 2. Stable mediastinal  and supraclavicular lymphadenopathy. 3. Progressive hepatic metastatic disease. 4. No abdominal/pelvic lymphadenopathy. 5. Stable significant age advanced vascular disease. Aortic Atherosclerosis (ICD10-I70.0). Electronically Signed   By: Marijo Sanes M.D.   On: 11/16/2021 10:02   DG CHEST PORT 1 VIEW  Result Date: 11/04/2021 CLINICAL DATA:  Community-acquired pneumonia. EXAM: PORTABLE CHEST 1 VIEW COMPARISON:  07/12/2021. FINDINGS: The heart size and mediastinal contours are stable. There is atherosclerotic calcification of the aorta. Patchy airspace opacities are noted in the perihilar region on the left and at the left lung base. No effusion or pneumothorax. A left chest port terminates over the superior vena cava. No acute osseous abnormality. IMPRESSION: Patchy airspace disease in the perihilar region on the left and at the left lung base, compatible with history of pneumonia. Electronically Signed   By: Brett Fairy M.D.   On: 11/04/2021 04:23   CT Soft Tissue Neck W Contrast  Result Date: 11/04/2021 CLINICAL DATA:  Initial evaluation for soft tissue swelling, infection suspected. EXAM: CT NECK WITH CONTRAST TECHNIQUE: Multidetector CT imaging of the neck was performed using the standard protocol following the bolus administration of intravenous contrast. RADIATION DOSE REDUCTION: This exam was performed according to the departmental dose-optimization program which includes automated exposure control, adjustment of the mA and/or kV  according to patient size and/or use of iterative reconstruction technique. CONTRAST:  89m OMNIPAQUE IOHEXOL 300 MG/ML  SOLN COMPARISON:  Prior CT from 05/24/2021, as well as prior PET-CT from 06/22/2021 and chest CT from 05/24/2021 FINDINGS: Pharynx and larynx: Oral cavity within normal limits. Oropharynx and nasopharynx within normal limits. No retropharyngeal collection or swelling. Negative epiglottis. Hypopharynx and supraglottic larynx within normal limits. Glottis grossly symmetric and within normal limits. Subglottic airway patent clear. Salivary glands: Salivary glands including the parotid and submandibular glands are within normal limits. Thyroid: Normal. Lymph nodes: Prominent left posterior level 5 a node measures up to 1 cm in short axis (series 2, image 32). Multiple additional scattered subcentimeter nodes seen along the left posterior chain. Few scattered prominent left supraclavicular nodes measure up to 1 cm. Enlarged precarinal node partially visualized, measuring 1.6 cm in short axis (series 2, image 101). Multiple additional scattered subcentimeter nodes noted within the visualized upper mediastinum. No visible necrosis. Associated mild swelling with hazy stranding within the left lateral neck. No right-sided adenopathy. Vascular: Moderate to advanced atheromatous disease seen about the aortic arch, carotid bifurcations and carotid siphons. Normal intravascular enhancement seen throughout the neck. Left-sided Port-A-Cath in place. Limited intracranial: Unremarkable. Visualized orbits: Prior bilateral ocular lens replacement. Otherwise unremarkable. Mastoids and visualized paranasal sinuses: Small retention cyst noted within the right sphenoid sinus. Visualized paranasal sinuses are otherwise clear. Visualized mastoids and middle ear cavities are well pneumatized and free of fluid. Skeleton: No discrete or worrisome osseous lesions. Patient is edentulous. Congenital segmental fusion of the C6  and C7 vertebral bodies noted. Ossification of the stylohyoid ligaments bilaterally, which can be seen the setting of Eagle syndrome. Upper chest: Emphysematous changes noted within the visualized lungs. Patchy irregular opacities noted within the partially visualized peripheral left upper lobe, suspicious for possible superimposed infiltrates (series 3, image 97). Other: None. IMPRESSION: 1. Scattered prominent and mildly enlarged lymph nodes involving the left posterior cervical chain, left supraclavicular region, and visualized upper mediastinum. Associated mild hazy stranding and swelling within the adjacent left neck. Findings are nonspecific, but concerning for possible nodal metastatic disease given patient history. Reactive adenopathy/lymphadenitis would be the primary differential consideration. No discrete abscess or drainable  fluid collection. 2. Patchy irregular opacities within the partially visualized left upper lobe, suspicious for infection/pneumonia. 3. Aortic Atherosclerosis (ICD10-I70.0) and Emphysema (ICD10-J43.9). Electronically Signed   By: Jeannine Boga M.D.   On: 11/04/2021 00:21     ASSESSMENT:  Liver masses/mediastinal and left supraclavicular adenopathy: - She has felt left supraclavicular lymph node since November 2022. - She reported 26 pound weight loss in the last 6 to 8 months.  She reports she has been on Ozempic for the last 8 months.  Denies any fevers or night sweats although reports severe tiredness which is worsening.  No new pains. - She had a hysterectomy at age 24 due to cancer.  Unclear uterine or cervical.  Did not require any further therapies. - EGD/colonoscopy on 11/17/2018: Nonbleeding internal hemorrhoids, normal colon, normal esophagus, nonbleeding erosive gastropathy, normal duodenum. - CT soft tissue neck (05/24/2021): Cluster of mildly prominent lymph nodes in the lower neck bilaterally left greater than right. - CT CAP (05/24/2021): Mediastinal and  left hilar adenopathy.  Numerous enhancing low-density lesions throughout the liver measuring up to 4.1 cm. - Liver biopsy (06/22/2021): Poorly differentiated carcinoma with necrosis.  IHC strong positivity for CK5/6, p63, PAX8.  Ki-67 with high proliferative rate.  Negative for CK7, CD56, CK20, chromogranin, synaptophysin, TTF-1, Napsin A, marked 31, ER, GATA3 and CDX2.  Differential includes renal or gynecological primary. - PET scan (06/22/2021): Hypermetabolic lymphadenopathy throughout the neck, chest, liver metastasis, 9 mm hypermetabolic lesion in the lower pole of the left kidney suspicious for small RCC. - Left supraclavicular lymph node biopsy: Metastatic poorly differentiated carcinoma, suggestive of squamous cell carcinoma.  Tumor positive for CK5/6 and p63. - NGS testing: HER2 negative.  T p53 pathogenic variant present.  TMB-low.  MSI-stable.  LOH-high at 23%.  PD-L1 (UU725) negative. - NGS test suggest 97% probability of squamous cell carcinoma. - Based on imaging, this appears most likely squamous cell carcinoma of the lung origin.       -3 cycles of carboplatin and paclitaxel from 09/20/2021 through 11/01/2021 with progression    Social/family history: - She lives at home with her son and daughter-in-law.  She has been on disability secondary to IBS since 2001.  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  She is a current active smoker, 1/3 pack/day for the last 30 years.   PLAN:  Metastatic squamous cell lung cancer to the liver: -We have reviewed images of the CT scan CAP from 11/15/2021 which showed progression of disease.  We discussed the second line treatment option with docetaxel and Cyramza versus best supportive care in the form of hospice.  Patient would like to try second line chemotherapy.  We plan to repeat scans after 2 cycles of docetaxel and Cyramza every 21 days.  We discussed side effects in detail.  I have also recommended cancer type  ID given prior history of "abnormal cancer cells in the cervix" 40 years ago.  We do not have records to access.  She will likely start cycle 1 on Wednesday.   2.  Dysphagia: - She reported dysphagia to solid foods.  She had 1 episode of regurgitation.  She has noticed that this happens often when she is not wearing dentures.  I have recommended eating finely chopped foods and soft foods.  Continue Ensure 3 cans/day and to 2 meals per day.  She is scheduled for swallow evaluation on 12/14/2021.  3.  Rheumatoid arthritis of hands and knees (13  years): - Previously treated with methotrexate and Humira.  Immunotherapy was not started due to possible reactivation. - Rheumatoid arthritis symptoms improved after chemotherapy.  4.  Neuropathy (numbness in the feet, legs and fingertips): - Continue gabapentin 2 tablets in the morning, 2 tablets at noon and 3 tablets at bedtime. - She does not report any neuropathic pains.        5.  Hypomagnesemia:        -Continue magnesium twice daily.  6.  Hypertension: - Blood pressure today is 124/75.  She has tachycardia.  Recommend starting Toprol-XL at a lower dose of 50 mg daily.  She was taking 100 mg daily until last week when it was held.  Continue to hold Norvasc.   Orders placed this encounter:  Orders Placed This Encounter  Procedures   CT CHEST ABDOMEN PELVIS W CONTRAST   CBC with Differential   Comprehensive metabolic panel   T4   TSH   Urinalysis, dipstick only   CBC with Differential   Comprehensive metabolic panel   CBC with Differential   Comprehensive metabolic panel   T4   TSH   Urinalysis, dipstick only   CBC with Differential   Comprehensive metabolic panel   CBC with Differential   Comprehensive metabolic panel   CBC with Differential   Comprehensive metabolic panel   T4   TSH   Urinalysis, dipstick only      Derek Jack, MD [place] [phone]   I, Thana Ates, am acting as a scribe  for Dr. Derek Jack.  I, Derek Jack MD, have reviewed the above documentation for accuracy and completeness, and I agree with the above.

## 2021-11-27 NOTE — Patient Instructions (Addendum)
[place]  Discharge Instructions  You were seen and examined today by Dr. Delton Coombes.  Dr. Delton Coombes discussed your most recent lab work and CT scan which revealed progression of the cancer. Because your cancer progressed so quickly, it is clearly aggressive and the second line treatment has a low likelihood of working.  The discussed treatment would be Taxotere and Cyramza. This is given once every 21 days. Dr. Delton Coombes would like for you to have a repeat  CT scan after two cycles.  Dr. Delton Coombes will also send a test known as Cancer Type ID to ensure it is not coming from a gynecological source.  Your HR is quite elevated today, please restart Metoprolol at a half dose (50mg ).  Follow-up as scheduled.  Thank you for choosing [facility] to provide your oncology and hematology care.   To afford each patient quality time with our provider, please arrive at least 15 minutes before your scheduled appointment time. You may need to reschedule your appointment if you arrive late (10 or more minutes). Arriving late affects you and other patients whose appointments are after yours.  Also, if you miss three or more appointments without notifying the office, you may be dismissed from the clinic at the provider's discretion.    Again, thank you for choosing [facility].  Our hope is that these requests will decrease the amount of time that you wait before being seen by our physicians.   If you have a lab appointment with the [facility] please come in thru the Main Entrance and check in at the main information desk.           _____________________________________________________________  Should you have questions after your visit to [facility], please contact our office at [phone] and follow the prompts.  Our office hours are 8:00 a.m. to 4:30 p.m. Monday - Thursday and 8:00 a.m. to 2:30 p.m. Friday.   Please note that voicemails left after 4:00 p.m. may not be returned until the following business day.  We are closed weekends and all major holidays.  You do have access to a nurse 24-7, just call the main number to the clinic [phone] and do not press any options, hold on the line and a nurse will answer the phone.    For prescription refill requests, have your pharmacy contact our office and allow 72 hours.    Masks are optional in the cancer centers. If you would like for your care team to wear a mask while they are taking care of you, please let them know. You may have one support person who is at least 69 years old accompany you for your appointments.

## 2021-11-27 NOTE — Progress Notes (Signed)
DISCONTINUE ON PATHWAY REGIMEN - Non-Small Cell Lung     A cycle is every 21 days:     Paclitaxel      Carboplatin   **Always confirm dose/schedule in your pharmacy ordering system**  REASON: Disease Progression PRIOR TREATMENT: [id number]: Carboplatin AUC=6 + Paclitaxel 200 mg/m2 q21 Days x 4-6 Cycles TREATMENT RESPONSE: Progressive Disease (PD)  START ON PATHWAY REGIMEN - Non-Small Cell Lung     A cycle is every 21 days:     Ramucirumab      Docetaxel   **Always confirm dose/schedule in your pharmacy ordering system**  Patient Characteristics: Stage IV Metastatic, Squamous, Molecular Analysis Completed, Alteration Present and Targeted Therapy Exhausted or EGFR Exon 20 Insertion or KRAS G12C or HER2 Present, and No Prior Chemo/Immunotherapy or No Alteration Present, PS = 0, 1, Second Line -  Chemotherapy/Immunotherapy, Prior PD-1/PD-L1 Inhibitor + Chemotherapy or No Prior PD-1/PD-L1 Inhibitor, and Not a Candidate for Immunotherapy Therapeutic Status: Stage IV Metastatic Histology: Squamous Cell Molecular Analysis Results: No Alteration Present ECOG Performance Status: 1 Chemotherapy/Immunotherapy Line of Therapy: Second Line Chemotherapy/Immunotherapy Immunotherapy Candidate Status: Not a Candidate for Immunotherapy Prior Immunotherapy Status: No Prior PD-1/PD-L1 Inhibitor Intent of Therapy: Non-Curative / Palliative Intent, Discussed with Patient

## 2021-11-28 ENCOUNTER — Encounter: Payer: Self-pay | Admitting: Hematology

## 2021-11-28 NOTE — Progress Notes (Signed)
I met with the patient and family during and following visit with Dr. Delton Coombes. Written information provided on the treatment regimen as discussed by Dr. Delton Coombes. Encouraged patient and family to call with questions or concerns.

## 2021-11-29 ENCOUNTER — Inpatient Hospital Stay: Payer: Medicare Other

## 2021-11-29 ENCOUNTER — Other Ambulatory Visit: Payer: Self-pay | Admitting: *Deleted

## 2021-11-29 VITALS — BP 137/81 | HR 95 | Temp 98.7°F | Resp 20

## 2021-11-29 DIAGNOSIS — Z800 Family history of malignant neoplasm of digestive organs: Secondary | ICD-10-CM | POA: Diagnosis not present

## 2021-11-29 DIAGNOSIS — C787 Secondary malignant neoplasm of liver and intrahepatic bile duct: Secondary | ICD-10-CM | POA: Diagnosis not present

## 2021-11-29 DIAGNOSIS — C3490 Malignant neoplasm of unspecified part of unspecified bronchus or lung: Secondary | ICD-10-CM

## 2021-11-29 DIAGNOSIS — F17200 Nicotine dependence, unspecified, uncomplicated: Secondary | ICD-10-CM | POA: Diagnosis not present

## 2021-11-29 DIAGNOSIS — M069 Rheumatoid arthritis, unspecified: Secondary | ICD-10-CM | POA: Diagnosis not present

## 2021-11-29 DIAGNOSIS — T451X5A Adverse effect of antineoplastic and immunosuppressive drugs, initial encounter: Secondary | ICD-10-CM | POA: Diagnosis not present

## 2021-11-29 DIAGNOSIS — Z5189 Encounter for other specified aftercare: Secondary | ICD-10-CM | POA: Diagnosis not present

## 2021-11-29 DIAGNOSIS — R110 Nausea: Secondary | ICD-10-CM | POA: Diagnosis not present

## 2021-11-29 DIAGNOSIS — R000 Tachycardia, unspecified: Secondary | ICD-10-CM | POA: Diagnosis not present

## 2021-11-29 DIAGNOSIS — Z5111 Encounter for antineoplastic chemotherapy: Secondary | ICD-10-CM | POA: Diagnosis not present

## 2021-11-29 DIAGNOSIS — Z79899 Other long term (current) drug therapy: Secondary | ICD-10-CM | POA: Diagnosis not present

## 2021-11-29 DIAGNOSIS — K589 Irritable bowel syndrome without diarrhea: Secondary | ICD-10-CM | POA: Diagnosis not present

## 2021-11-29 DIAGNOSIS — I10 Essential (primary) hypertension: Secondary | ICD-10-CM | POA: Diagnosis not present

## 2021-11-29 DIAGNOSIS — Z95828 Presence of other vascular implants and grafts: Secondary | ICD-10-CM

## 2021-11-29 DIAGNOSIS — R1310 Dysphagia, unspecified: Secondary | ICD-10-CM | POA: Diagnosis not present

## 2021-11-29 DIAGNOSIS — G620 Drug-induced polyneuropathy: Secondary | ICD-10-CM | POA: Diagnosis not present

## 2021-11-29 LAB — CBC WITH DIFFERENTIAL/PLATELET
Abs Immature Granulocytes: 0.06 10*3/uL (ref 0.00–0.07)
Basophils Absolute: 0 10*3/uL (ref 0.0–0.1)
Basophils Relative: 0 %
Eosinophils Absolute: 0 10*3/uL (ref 0.0–0.5)
Eosinophils Relative: 0 %
HCT: 31.5 % — ABNORMAL LOW (ref 36.0–46.0)
Hemoglobin: 9.6 g/dL — ABNORMAL LOW (ref 12.0–15.0)
Immature Granulocytes: 1 %
Lymphocytes Relative: 14 %
Lymphs Abs: 1.8 10*3/uL (ref 0.7–4.0)
MCH: 25.5 pg — ABNORMAL LOW (ref 26.0–34.0)
MCHC: 30.5 g/dL (ref 30.0–36.0)
MCV: 83.8 fL (ref 80.0–100.0)
Monocytes Absolute: 1 10*3/uL (ref 0.1–1.0)
Monocytes Relative: 8 %
Neutro Abs: 9.9 10*3/uL — ABNORMAL HIGH (ref 1.7–7.7)
Neutrophils Relative %: 77 %
Platelets: 403 10*3/uL — ABNORMAL HIGH (ref 150–400)
RBC: 3.76 MIL/uL — ABNORMAL LOW (ref 3.87–5.11)
RDW: 20.6 % — ABNORMAL HIGH (ref 11.5–15.5)
WBC: 12.9 10*3/uL — ABNORMAL HIGH (ref 4.0–10.5)
nRBC: 0 % (ref 0.0–0.2)

## 2021-11-29 LAB — COMPREHENSIVE METABOLIC PANEL
ALT: 26 U/L (ref 0–44)
AST: 24 U/L (ref 15–41)
Albumin: 2.4 g/dL — ABNORMAL LOW (ref 3.5–5.0)
Alkaline Phosphatase: 107 U/L (ref 38–126)
Anion gap: 9 (ref 5–15)
BUN: 13 mg/dL (ref 8–23)
CO2: 24 mmol/L (ref 22–32)
Calcium: 11.2 mg/dL — ABNORMAL HIGH (ref 8.9–10.3)
Chloride: 102 mmol/L (ref 98–111)
Creatinine, Ser: 0.63 mg/dL (ref 0.44–1.00)
GFR, Estimated: >60 mL/min (ref >60)
Glucose, Bld: 152 mg/dL — ABNORMAL HIGH (ref 70–99)
Potassium: 3.8 mmol/L (ref 3.5–5.1)
Sodium: 135 mmol/L (ref 135–145)
Total Bilirubin: 0.6 mg/dL (ref 0.3–1.2)
Total Protein: 7.7 g/dL (ref 6.5–8.1)

## 2021-11-29 LAB — URINALYSIS, DIPSTICK ONLY
Bilirubin Urine: NEGATIVE
Glucose, UA: NEGATIVE mg/dL
Hgb urine dipstick: NEGATIVE
Ketones, ur: NEGATIVE mg/dL
Leukocytes,Ua: NEGATIVE
Nitrite: NEGATIVE
Protein, ur: NEGATIVE mg/dL
Specific Gravity, Urine: 1.017 (ref 1.005–1.030)
pH: 6 (ref 5.0–8.0)

## 2021-11-29 LAB — TSH: TSH: 3.082 u[IU]/mL (ref 0.350–4.500)

## 2021-11-29 MED ORDER — ACETAMINOPHEN 325 MG PO TABS
650.0000 mg | ORAL_TABLET | Freq: Once | ORAL | Status: AC
  Administered 2021-11-29: 650 mg via ORAL
  Filled 2021-11-29: qty 2

## 2021-11-29 MED ORDER — HEPARIN SOD (PORK) LOCK FLUSH 100 UNIT/ML IV SOLN
500.0000 [IU] | Freq: Once | INTRAVENOUS | Status: AC | PRN
  Administered 2021-11-29: 500 [IU]

## 2021-11-29 MED ORDER — DIPHENHYDRAMINE HCL 50 MG/ML IJ SOLN
50.0000 mg | Freq: Once | INTRAMUSCULAR | Status: AC
  Administered 2021-11-29: 50 mg via INTRAVENOUS
  Filled 2021-11-29: qty 1

## 2021-11-29 MED ORDER — SODIUM CHLORIDE 0.9 % IV SOLN
10.0000 mg | Freq: Once | INTRAVENOUS | Status: AC
  Administered 2021-11-29: 10 mg via INTRAVENOUS
  Filled 2021-11-29: qty 10

## 2021-11-29 MED ORDER — SODIUM CHLORIDE 0.9 % IV SOLN
60.0000 mg/m2 | Freq: Once | INTRAVENOUS | Status: AC
  Administered 2021-11-29: 110 mg via INTRAVENOUS
  Filled 2021-11-29: qty 11

## 2021-11-29 MED ORDER — IPRATROPIUM-ALBUTEROL 0.5-2.5 (3) MG/3ML IN SOLN
3.0000 mL | Freq: Once | RESPIRATORY_TRACT | Status: AC
  Administered 2021-11-29: 3 mL via RESPIRATORY_TRACT
  Filled 2021-11-29: qty 3

## 2021-11-29 MED ORDER — SODIUM CHLORIDE 0.9 % IV SOLN
10.0000 mg/kg | Freq: Once | INTRAVENOUS | Status: AC
  Administered 2021-11-29: 700 mg via INTRAVENOUS
  Filled 2021-11-29: qty 50

## 2021-11-29 MED ORDER — SODIUM CHLORIDE 0.9 % IV SOLN: Freq: Once | INTRAVENOUS | Status: AC

## 2021-11-29 MED ORDER — SODIUM CHLORIDE 0.9% FLUSH
10.0000 mL | INTRAVENOUS | Status: DC | PRN
  Administered 2021-11-29: 10 mL

## 2021-11-29 NOTE — Progress Notes (Signed)
Patient presents today for D1C1 Cyramza/Taxotere per providers order.  Labs within parameters for treatment.  Pulse is 111, MD notified.  Patient has had a taxane in the past, per pharmacist there is no need to titrate Taxotere.

## 2021-11-29 NOTE — Progress Notes (Signed)
Ok to start Cyramza without Urine Protein reported out.  Patient will provide sample to lab.  T.O. Dr Rhys Martini, PharmD

## 2021-11-29 NOTE — Progress Notes (Signed)
Hypersensitivity Reaction note  Date of event: 11/29/21 Time of event: 1352 Generic name of drug involved: Docetaxel Name of provider notified of the hypersensitivity reaction: Delton Coombes Was agent that likely caused hypersensitivity reaction added to Allergies List within EMR? yes Chain of events including reaction signs/symptoms, treatment administered, and outcome (e.g., drug resumed; drug discontinued; sent to Emergency Department; etc.) 7793- Nurse Tech in room assessing vitals and noticed patient's breathing had changed. Respiratory rate was 22. Patient stated she had gotten hot feeling.    1353-nurse in room, patient is wheezing and still flushed and hot feeling. MD notified and came in to assess.    Duoneb ordered and administered per orders by respiratory therapist.    1430-patient feeling much better now. Breathing is noticeably better. Will monitor and reassess for discharge.    1455-patient's symptoms have subsided. Vitals stable. MD made aware, ok to discharge pt.    Treatment given per orders. Vitals stable and discharged home from clinic via wheelchair. Follow up as scheduled.    Marlane Hatcher, RN 11/29/2021 4:19 PM

## 2021-11-29 NOTE — Progress Notes (Signed)
Pharmacist Chemotherapy Monitoring - Initial Assessment    Anticipated start date: 11/29/21   The following has been reviewed per standard work regarding the patient's treatment regimen: The patient's diagnosis, treatment plan and drug doses, and organ/hematologic function Lab orders and baseline tests specific to treatment regimen  The treatment plan start date, drug sequencing, and pre-medications Prior authorization status  Patient's documented medication list, including drug-drug interaction screen and prescriptions for anti-emetics and supportive care specific to the treatment regimen The drug concentrations, fluid compatibility, administration routes, and timing of the medications to be used The patient's access for treatment and lifetime cumulative dose history, if applicable  The patient's medication allergies and previous infusion related reactions, if applicable   Changes made to treatment plan:  N/A  Follow up needed:  N/A   Wynona Neat, [facility], 11/29/2021  9:31 AM

## 2021-11-29 NOTE — Patient Instructions (Signed)
[facility]  Discharge Instructions: Thank you for choosing [facility] to provide your oncology and hematology care.  If you have a lab appointment with the [facility], please come in thru the Main Entrance and check in at the main information desk.  Wear comfortable clothing and clothing appropriate for easy access to any Portacath or PICC line.   We strive to give you quality time with your provider. You may need to reschedule your appointment if you arrive late (15 or more minutes).  Arriving late affects you and other patients whose appointments are after yours.  Also, if you miss three or more appointments without notifying the office, you may be dismissed from the clinic at the provider's discretion.      For prescription refill requests, have your pharmacy contact our office and allow 72 hours for refills to be completed.    Today you received the following chemotherapy and/or immunotherapy agents Cyramza Taxotere      To help prevent nausea and vomiting after your treatment, we encourage you to take your nausea medication as directed.  BELOW ARE SYMPTOMS THAT SHOULD BE REPORTED IMMEDIATELY: *FEVER GREATER THAN 100.4 F (38 C) OR HIGHER *CHILLS OR SWEATING *NAUSEA AND VOMITING THAT IS NOT CONTROLLED WITH YOUR NAUSEA MEDICATION *UNUSUAL SHORTNESS OF BREATH *UNUSUAL BRUISING OR BLEEDING *URINARY PROBLEMS (pain or burning when urinating, or frequent urination) *BOWEL PROBLEMS (unusual diarrhea, constipation, pain near the anus) TENDERNESS IN MOUTH AND THROAT WITH OR WITHOUT PRESENCE OF ULCERS (sore throat, sores in mouth, or a toothache) UNUSUAL RASH, SWELLING OR PAIN  UNUSUAL VAGINAL DISCHARGE OR ITCHING   Items with * indicate a potential emergency and should be followed up as soon as possible or go to the Emergency Department if any problems should occur.  Please show the CHEMOTHERAPY ALERT CARD or IMMUNOTHERAPY ALERT CARD at check-in to the  Emergency Department and triage nurse.  Should you have questions after your visit or need to cancel or reschedule your appointment, please contact [facility] [phone]  and follow the prompts.  Office hours are 8:00 a.m. to 4:30 p.m. Monday - Friday. Please note that voicemails left after 4:00 p.m. may not be returned until the following business day.  We are closed weekends and major holidays. You have access to a nurse at all times for urgent questions. Please call the main number to the clinic [phone] and follow the prompts.  For any non-urgent questions, you may also contact your provider using MyChart. We now offer e-Visits for anyone 25 and older to request care online for non-urgent symptoms. For details visit [email or web address].   Also download the MyChart app! Go to the app store, search "MyChart", open the app, select [facility], and log in with your MyChart username and password.  Masks are optional in the cancer centers. If you would like for your care team to wear a mask while they are taking care of you, please let them know. You may have one support person who is at least 68 years old accompany you for your appointments.

## 2021-11-29 NOTE — Progress Notes (Signed)
36- Nurse Tech in room assessing vitals and noticed patient's breathing had changed. Respiratory rate was 22. Patient stated she had gotten hot feeling.   1353-nurse in room, patient is wheezing and still flushed and hot feeling. MD notified and came in to assess.   Duoneb ordered and administered per orders by respiratory therapist.   1430-patient feeling much better now. Breathing is noticeably better. Will monitor and reassess for discharge.   1455-patient's symptoms have subsided. Vitals stable. MD made aware, ok to discharge pt.   Treatment given per orders. Vitals stable and discharged home from clinic via wheelchair. Follow up as scheduled.

## 2021-11-30 LAB — T4: T4, Total: 10.3 ug/dL (ref 4.5–12.0)

## 2021-11-30 NOTE — Progress Notes (Signed)
24 hour call back.  No answer.  Left message to return call.

## 2021-12-01 ENCOUNTER — Inpatient Hospital Stay: Payer: Medicare Other

## 2021-12-01 VITALS — BP 98/70 | HR 88 | Temp 96.1°F | Resp 18

## 2021-12-01 DIAGNOSIS — E119 Type 2 diabetes mellitus without complications: Secondary | ICD-10-CM | POA: Diagnosis not present

## 2021-12-01 DIAGNOSIS — M069 Rheumatoid arthritis, unspecified: Secondary | ICD-10-CM | POA: Diagnosis not present

## 2021-12-01 DIAGNOSIS — G620 Drug-induced polyneuropathy: Secondary | ICD-10-CM | POA: Diagnosis not present

## 2021-12-01 DIAGNOSIS — Z800 Family history of malignant neoplasm of digestive organs: Secondary | ICD-10-CM | POA: Diagnosis not present

## 2021-12-01 DIAGNOSIS — C3490 Malignant neoplasm of unspecified part of unspecified bronchus or lung: Secondary | ICD-10-CM | POA: Diagnosis not present

## 2021-12-01 DIAGNOSIS — Z5189 Encounter for other specified aftercare: Secondary | ICD-10-CM | POA: Diagnosis not present

## 2021-12-01 DIAGNOSIS — R1310 Dysphagia, unspecified: Secondary | ICD-10-CM | POA: Diagnosis not present

## 2021-12-01 DIAGNOSIS — T451X5A Adverse effect of antineoplastic and immunosuppressive drugs, initial encounter: Secondary | ICD-10-CM | POA: Diagnosis not present

## 2021-12-01 DIAGNOSIS — Z95828 Presence of other vascular implants and grafts: Secondary | ICD-10-CM

## 2021-12-01 DIAGNOSIS — R110 Nausea: Secondary | ICD-10-CM | POA: Diagnosis not present

## 2021-12-01 DIAGNOSIS — F17200 Nicotine dependence, unspecified, uncomplicated: Secondary | ICD-10-CM | POA: Diagnosis not present

## 2021-12-01 DIAGNOSIS — Z79899 Other long term (current) drug therapy: Secondary | ICD-10-CM | POA: Diagnosis not present

## 2021-12-01 DIAGNOSIS — Z5111 Encounter for antineoplastic chemotherapy: Secondary | ICD-10-CM | POA: Diagnosis not present

## 2021-12-01 DIAGNOSIS — C787 Secondary malignant neoplasm of liver and intrahepatic bile duct: Secondary | ICD-10-CM | POA: Diagnosis not present

## 2021-12-01 DIAGNOSIS — R000 Tachycardia, unspecified: Secondary | ICD-10-CM | POA: Diagnosis not present

## 2021-12-01 DIAGNOSIS — K589 Irritable bowel syndrome without diarrhea: Secondary | ICD-10-CM | POA: Diagnosis not present

## 2021-12-01 DIAGNOSIS — I10 Essential (primary) hypertension: Secondary | ICD-10-CM | POA: Diagnosis not present

## 2021-12-01 MED ORDER — PEGFILGRASTIM-CBQV 6 MG/0.6ML ~~LOC~~ SOSY
6.0000 mg | PREFILLED_SYRINGE | Freq: Once | SUBCUTANEOUS | Status: AC
  Administered 2021-12-01: 6 mg via SUBCUTANEOUS
  Filled 2021-12-01: qty 0.6

## 2021-12-01 NOTE — Progress Notes (Signed)
Brittney Martinez presents today for injection per the provider's orders.  Udenyca administration without incident; injection site WNL; see MAR for injection details.  Patient tolerated procedure well and without incident.  No questions or complaints noted at this time. Discharged from clinic by wheel chair in stable condition. Alert and oriented x 3. F/U with [facility] as scheduled.

## 2021-12-01 NOTE — Patient Instructions (Signed)
[facility]  Discharge Instructions: Thank you for choosing [facility] to provide your oncology and hematology care.  If you have a lab appointment with the [facility], please come in thru the Main Entrance and check in at the main information desk.  Wear comfortable clothing and clothing appropriate for easy access to any Portacath or PICC line.   We strive to give you quality time with your provider. You may need to reschedule your appointment if you arrive late (15 or more minutes).  Arriving late affects you and other patients whose appointments are after yours.  Also, if you miss three or more appointments without notifying the office, you may be dismissed from the clinic at the provider's discretion.      For prescription refill requests, have your pharmacy contact our office and allow 72 hours for refills to be completed.    Today you received the following chemotherapy and/or immunotherapy agents Udenyca injection.    Pegfilgrastim Injection What is this medication? PEGFILGRASTIM (PEG fil gra stim) lowers the risk of infection in people who are receiving chemotherapy. It works by Building control surveyor make more white blood cells, which protects your body from infection. It may also be used to help people who have been exposed to high doses of radiation. This medicine may be used for other purposes; ask your health care provider or pharmacist if you have questions. COMMON BRAND NAME(S): [employer], Neulasta, Nyvepria, Stimufend, UDENYCA, Ziextenzo What should I tell my care team before I take this medication? They need to know if you have any of these conditions: Kidney disease Latex allergy Ongoing radiation therapy Sickle cell disease Skin reactions to acrylic adhesives (On-Body Injector only) An unusual or allergic reaction to pegfilgrastim, filgrastim, other medications, foods, dyes, or preservatives Pregnant or trying to get  pregnant Breast-feeding How should I use this medication? This medication is for injection under the skin. If you get this medication at home, you will be taught how to prepare and give the pre-filled syringe or how to use the On-body Injector. Refer to the patient Instructions for Use for detailed instructions. Use exactly as directed. Tell your care team immediately if you suspect that the On-body Injector may not have performed as intended or if you suspect the use of the On-body Injector resulted in a missed or partial dose. It is important that you put your used needles and syringes in a special sharps container. Do not put them in a trash can. If you do not have a sharps container, call your pharmacist or care team to get one. Talk to your care team about the use of this medication in children. While this medication may be prescribed for selected conditions, precautions do apply. Overdosage: If you think you have taken too much of this medicine contact a poison control center or emergency room at once. NOTE: This medicine is only for you. Do not share this medicine with others. What if I miss a dose? It is important not to miss your dose. Call your care team if you miss your dose. If you miss a dose due to an On-body Injector failure or leakage, a new dose should be administered as soon as possible using a single prefilled syringe for manual use. What may interact with this medication? Interactions have not been studied. This list may not describe all possible interactions. Give your health care provider a list of all the medicines, herbs, non-prescription drugs, or dietary supplements you  use. Also tell them if you smoke, drink alcohol, or use illegal drugs. Some items may interact with your medicine. What should I watch for while using this medication? Your condition will be monitored carefully while you are receiving this medication. You may need blood work done while you are taking this  medication. Talk to your care team about your risk of cancer. You may be more at risk for certain types of cancer if you take this medication. If you are going to need a MRI, CT scan, or other procedure, tell your care team that you are using this medication (On-Body Injector only). What side effects may I notice from receiving this medication? Side effects that you should report to your care team as soon as possible: Allergic reactions--skin rash, itching, hives, swelling of the face, lips, tongue, or throat Capillary leak syndrome--stomach or muscle pain, unusual weakness or fatigue, feeling faint or lightheaded, decrease in the amount of urine, swelling of the ankles, hands, or feet, trouble breathing High white blood cell level--fever, fatigue, trouble breathing, night sweats, change in vision, weight loss Inflammation of the aorta--fever, fatigue, back, chest, or stomach pain, severe headache Kidney injury (glomerulonephritis)--decrease in the amount of urine, red or dark brown urine, foamy or bubbly urine, swelling of the ankles, hands, or feet Shortness of breath or trouble breathing Spleen injury--pain in upper left stomach or shoulder Unusual bruising or bleeding Side effects that usually do not require medical attention (report to your care team if they continue or are bothersome): Bone pain Pain in the hands or feet This list may not describe all possible side effects. Call your doctor for medical advice about side effects. You may report side effects to FDA at 1-800-FDA-1088. Where should I keep my medication? Keep out of the reach of children. If you are using this medication at home, you will be instructed on how to store it. Throw away any unused medication after the expiration date on the label. NOTE: This sheet is a summary. It may not cover all possible information. If you have questions about this medicine, talk to your doctor, pharmacist, or health care provider.  2023  Elsevier/Gold Standard (2013-05-22 00:00:00)     To help prevent nausea and vomiting after your treatment, we encourage you to take your nausea medication as directed.  BELOW ARE SYMPTOMS THAT SHOULD BE REPORTED IMMEDIATELY: *FEVER GREATER THAN 100.4 F (38 C) OR HIGHER *CHILLS OR SWEATING *NAUSEA AND VOMITING THAT IS NOT CONTROLLED WITH YOUR NAUSEA MEDICATION *UNUSUAL SHORTNESS OF BREATH *UNUSUAL BRUISING OR BLEEDING *URINARY PROBLEMS (pain or burning when urinating, or frequent urination) *BOWEL PROBLEMS (unusual diarrhea, constipation, pain near the anus) TENDERNESS IN MOUTH AND THROAT WITH OR WITHOUT PRESENCE OF ULCERS (sore throat, sores in mouth, or a toothache) UNUSUAL RASH, SWELLING OR PAIN  UNUSUAL VAGINAL DISCHARGE OR ITCHING   Items with * indicate a potential emergency and should be followed up as soon as possible or go to the Emergency Department if any problems should occur.  Please show the CHEMOTHERAPY ALERT CARD or IMMUNOTHERAPY ALERT CARD at check-in to the Emergency Department and triage nurse.  Should you have questions after your visit or need to cancel or reschedule your appointment, please contact [facility] [phone]  and follow the prompts.  Office hours are 8:00 a.m. to 4:30 p.m. Monday - Friday. Please note that voicemails left after 4:00 p.m. may not be returned until the following business day.  We are closed weekends and major  holidays. You have access to a nurse at all times for urgent questions. Please call the main number to the clinic [phone] and follow the prompts.  For any non-urgent questions, you may also contact your provider using MyChart. We now offer e-Visits for anyone 78 and older to request care online for non-urgent symptoms. For details visit [email or web address].   Also download the MyChart app! Go to the app store, search "MyChart", open the app, select [facility], and log in with your MyChart username and  password.  Masks are optional in the cancer centers. If you would like for your care team to wear a mask while they are taking care of you, please let them know. You may have one support person who is at least 68 years old accompany you for your appointments.

## 2021-12-02 DIAGNOSIS — E1165 Type 2 diabetes mellitus with hyperglycemia: Secondary | ICD-10-CM | POA: Diagnosis not present

## 2021-12-02 DIAGNOSIS — I10 Essential (primary) hypertension: Secondary | ICD-10-CM | POA: Diagnosis not present

## 2021-12-05 ENCOUNTER — Other Ambulatory Visit: Payer: Self-pay | Admitting: *Deleted

## 2021-12-05 DIAGNOSIS — D649 Anemia, unspecified: Secondary | ICD-10-CM

## 2021-12-05 NOTE — Progress Notes (Signed)
Patient's daughter in law called to report patient not feeling well in general and was assessed by PCP today, who suggested she receive fluids.  Will draw labs on Thursday and put on for possible fluids/blood on Friday.  Dr. Delton Coombes aware.

## 2021-12-06 ENCOUNTER — Other Ambulatory Visit: Payer: Self-pay

## 2021-12-07 ENCOUNTER — Other Ambulatory Visit: Payer: Self-pay

## 2021-12-07 ENCOUNTER — Inpatient Hospital Stay: Payer: Medicare Other | Attending: Hematology

## 2021-12-07 DIAGNOSIS — I10 Essential (primary) hypertension: Secondary | ICD-10-CM | POA: Diagnosis not present

## 2021-12-07 DIAGNOSIS — D649 Anemia, unspecified: Secondary | ICD-10-CM

## 2021-12-07 DIAGNOSIS — C787 Secondary malignant neoplasm of liver and intrahepatic bile duct: Secondary | ICD-10-CM | POA: Diagnosis not present

## 2021-12-07 DIAGNOSIS — M069 Rheumatoid arthritis, unspecified: Secondary | ICD-10-CM | POA: Diagnosis not present

## 2021-12-07 DIAGNOSIS — C3490 Malignant neoplasm of unspecified part of unspecified bronchus or lung: Secondary | ICD-10-CM | POA: Diagnosis not present

## 2021-12-07 DIAGNOSIS — Z7189 Other specified counseling: Secondary | ICD-10-CM | POA: Diagnosis not present

## 2021-12-07 DIAGNOSIS — Z299 Encounter for prophylactic measures, unspecified: Secondary | ICD-10-CM | POA: Diagnosis not present

## 2021-12-07 DIAGNOSIS — R64 Cachexia: Secondary | ICD-10-CM | POA: Diagnosis not present

## 2021-12-07 DIAGNOSIS — J9611 Chronic respiratory failure with hypoxia: Secondary | ICD-10-CM | POA: Diagnosis not present

## 2021-12-07 DIAGNOSIS — Z0000 Encounter for general adult medical examination without abnormal findings: Secondary | ICD-10-CM | POA: Diagnosis not present

## 2021-12-07 LAB — CBC WITH DIFFERENTIAL/PLATELET
Abs Immature Granulocytes: 0 10*3/uL (ref 0.00–0.07)
Band Neutrophils: 4 %
Basophils Absolute: 0 10*3/uL (ref 0.0–0.1)
Basophils Relative: 0 %
Eosinophils Absolute: 0 10*3/uL (ref 0.0–0.5)
Eosinophils Relative: 0 %
HCT: 30.7 % — ABNORMAL LOW (ref 36.0–46.0)
Hemoglobin: 9.2 g/dL — ABNORMAL LOW (ref 12.0–15.0)
Lymphocytes Relative: 28 %
Lymphs Abs: 2 10*3/uL (ref 0.7–4.0)
MCH: 25.3 pg — ABNORMAL LOW (ref 26.0–34.0)
MCHC: 30 g/dL (ref 30.0–36.0)
MCV: 84.6 fL (ref 80.0–100.0)
Monocytes Absolute: 0.8 10*3/uL (ref 0.1–1.0)
Monocytes Relative: 11 %
Neutro Abs: 4.3 10*3/uL (ref 1.7–7.7)
Neutrophils Relative %: 57 %
Platelets: 345 10*3/uL (ref 150–400)
RBC: 3.63 MIL/uL — ABNORMAL LOW (ref 3.87–5.11)
RDW: 20.9 % — ABNORMAL HIGH (ref 11.5–15.5)
WBC: 7.1 10*3/uL (ref 4.0–10.5)
nRBC: 7.4 % — ABNORMAL HIGH (ref 0.0–0.2)

## 2021-12-07 LAB — COMPREHENSIVE METABOLIC PANEL
ALT: 31 U/L (ref 0–44)
AST: 30 U/L (ref 15–41)
Albumin: 2.3 g/dL — ABNORMAL LOW (ref 3.5–5.0)
Alkaline Phosphatase: 126 U/L (ref 38–126)
Anion gap: 11 (ref 5–15)
BUN: 9 mg/dL (ref 8–23)
CO2: 26 mmol/L (ref 22–32)
Calcium: 10.4 mg/dL — ABNORMAL HIGH (ref 8.9–10.3)
Chloride: 103 mmol/L (ref 98–111)
Creatinine, Ser: 0.57 mg/dL (ref 0.44–1.00)
GFR, Estimated: >60 mL/min (ref >60)
Glucose, Bld: 47 mg/dL — ABNORMAL LOW (ref 70–99)
Potassium: 3.4 mmol/L — ABNORMAL LOW (ref 3.5–5.1)
Sodium: 140 mmol/L (ref 135–145)
Total Bilirubin: 0.5 mg/dL (ref 0.3–1.2)
Total Protein: 7.3 g/dL (ref 6.5–8.1)

## 2021-12-07 LAB — SAMPLE TO BLOOD BANK

## 2021-12-07 LAB — MAGNESIUM: Magnesium: 1.8 mg/dL (ref 1.7–2.4)

## 2021-12-07 NOTE — Progress Notes (Signed)
Port flushed well with good blood return.  Site clean, dry and intact.  Left accessed for fluids tomorrow, per patient request. Flushed with heparin per protocol.  Tolerated procedure and discharged to home via [facility] in stable condition with daughter.

## 2021-12-08 ENCOUNTER — Inpatient Hospital Stay: Payer: Medicare Other

## 2021-12-08 VITALS — BP 106/64 | HR 87 | Temp 97.0°F | Resp 18

## 2021-12-08 DIAGNOSIS — J449 Chronic obstructive pulmonary disease, unspecified: Secondary | ICD-10-CM | POA: Diagnosis not present

## 2021-12-08 DIAGNOSIS — C3490 Malignant neoplasm of unspecified part of unspecified bronchus or lung: Secondary | ICD-10-CM | POA: Diagnosis not present

## 2021-12-08 DIAGNOSIS — C787 Secondary malignant neoplasm of liver and intrahepatic bile duct: Secondary | ICD-10-CM | POA: Diagnosis not present

## 2021-12-08 DIAGNOSIS — Z95828 Presence of other vascular implants and grafts: Secondary | ICD-10-CM

## 2021-12-08 MED ORDER — SODIUM CHLORIDE 0.9 % IV SOLN: INTRAVENOUS | Status: DC

## 2021-12-08 MED ORDER — HEPARIN SOD (PORK) LOCK FLUSH 100 UNIT/ML IV SOLN
500.0000 [IU] | Freq: Once | INTRAVENOUS | Status: AC
  Administered 2021-12-08: 500 [IU] via INTRAVENOUS

## 2021-12-08 MED ORDER — SODIUM CHLORIDE 0.9% FLUSH
10.0000 mL | INTRAVENOUS | Status: DC | PRN
  Administered 2021-12-08: 10 mL via INTRAVENOUS

## 2021-12-08 NOTE — Progress Notes (Signed)
Patient tolerated hydration therapy with no complaints voiced. Side effects with management reviewed with understanding verbalized. Port site clean and dry with no bruising or swelling noted at site. Good blood return noted before and after administration of therapy. Band aid applied. Patient left in satisfactory condition with VSS and no s/s of distress noted.

## 2021-12-08 NOTE — Patient Instructions (Signed)
[facility]  Discharge Instructions: Thank you for choosing [facility] to provide your oncology and hematology care.  If you have a lab appointment with the [facility], please come in thru the Main Entrance and check in at the main information desk.  Wear comfortable clothing and clothing appropriate for easy access to any Portacath or PICC line.   We strive to give you quality time with your provider. You may need to reschedule your appointment if you arrive late (15 or more minutes).  Arriving late affects you and other patients whose appointments are after yours.  Also, if you miss three or more appointments without notifying the office, you may be dismissed from the clinic at the provider's discretion.      For prescription refill requests, have your pharmacy contact our office and allow 72 hours for refills to be completed.    Today you received the following 1L of normal saline, return as scheduled.   To help prevent nausea and vomiting after your treatment, we encourage you to take your nausea medication as directed.  BELOW ARE SYMPTOMS THAT SHOULD BE REPORTED IMMEDIATELY: *FEVER GREATER THAN 100.4 F (38 C) OR HIGHER *CHILLS OR SWEATING *NAUSEA AND VOMITING THAT IS NOT CONTROLLED WITH YOUR NAUSEA MEDICATION *UNUSUAL SHORTNESS OF BREATH *UNUSUAL BRUISING OR BLEEDING *URINARY PROBLEMS (pain or burning when urinating, or frequent urination) *BOWEL PROBLEMS (unusual diarrhea, constipation, pain near the anus) TENDERNESS IN MOUTH AND THROAT WITH OR WITHOUT PRESENCE OF ULCERS (sore throat, sores in mouth, or a toothache) UNUSUAL RASH, SWELLING OR PAIN  UNUSUAL VAGINAL DISCHARGE OR ITCHING   Items with * indicate a potential emergency and should be followed up as soon as possible or go to the Emergency Department if any problems should occur.  Please show the CHEMOTHERAPY ALERT CARD or IMMUNOTHERAPY ALERT CARD at check-in to the Emergency  Department and triage nurse.  Should you have questions after your visit or need to cancel or reschedule your appointment, please contact [facility] [phone]  and follow the prompts.  Office hours are 8:00 a.m. to 4:30 p.m. Monday - Friday. Please note that voicemails left after 4:00 p.m. may not be returned until the following business day.  We are closed weekends and major holidays. You have access to a nurse at all times for urgent questions. Please call the main number to the clinic [phone] and follow the prompts.  For any non-urgent questions, you may also contact your provider using MyChart. We now offer e-Visits for anyone 53 and older to request care online for non-urgent symptoms. For details visit [email or web address].   Also download the MyChart app! Go to the app store, search "MyChart", open the app, select [facility], and log in with your MyChart username and password.  Masks are optional in the cancer centers. If you would like for your care team to wear a mask while they are taking care of you, please let them know. You may have one support person who is at least 68 years old accompany you for your appointments.

## 2021-12-11 ENCOUNTER — Other Ambulatory Visit: Payer: Self-pay

## 2021-12-11 DIAGNOSIS — R1310 Dysphagia, unspecified: Secondary | ICD-10-CM

## 2021-12-11 NOTE — Progress Notes (Signed)
Order placed for MBSS per Dr. Delton Coombes

## 2021-12-13 ENCOUNTER — Ambulatory Visit: Payer: Medicare Other

## 2021-12-13 ENCOUNTER — Ambulatory Visit: Payer: Medicare Other | Admitting: Hematology

## 2021-12-13 ENCOUNTER — Other Ambulatory Visit: Payer: Medicare Other

## 2021-12-14 ENCOUNTER — Ambulatory Visit (HOSPITAL_COMMUNITY): Payer: Medicare Other | Admitting: Speech Pathology

## 2021-12-14 ENCOUNTER — Other Ambulatory Visit (HOSPITAL_COMMUNITY): Payer: Self-pay | Admitting: Specialist

## 2021-12-14 DIAGNOSIS — C3490 Malignant neoplasm of unspecified part of unspecified bronchus or lung: Secondary | ICD-10-CM

## 2021-12-14 DIAGNOSIS — R1312 Dysphagia, oropharyngeal phase: Secondary | ICD-10-CM

## 2021-12-15 ENCOUNTER — Ambulatory Visit: Payer: Medicare Other

## 2021-12-15 DIAGNOSIS — Z299 Encounter for prophylactic measures, unspecified: Secondary | ICD-10-CM | POA: Diagnosis not present

## 2021-12-15 DIAGNOSIS — E1165 Type 2 diabetes mellitus with hyperglycemia: Secondary | ICD-10-CM | POA: Diagnosis not present

## 2021-12-15 DIAGNOSIS — M069 Rheumatoid arthritis, unspecified: Secondary | ICD-10-CM | POA: Diagnosis not present

## 2021-12-15 DIAGNOSIS — J9611 Chronic respiratory failure with hypoxia: Secondary | ICD-10-CM | POA: Diagnosis not present

## 2021-12-15 DIAGNOSIS — I10 Essential (primary) hypertension: Secondary | ICD-10-CM | POA: Diagnosis not present

## 2021-12-17 ENCOUNTER — Emergency Department (HOSPITAL_COMMUNITY)
Admission: EM | Admit: 2021-12-17 | Discharge: 2021-12-17 | Disposition: A | Discharge status: Home / Self Care | Payer: Medicare Other | Attending: Emergency Medicine | Admitting: Emergency Medicine

## 2021-12-17 ENCOUNTER — Other Ambulatory Visit: Payer: Self-pay | Admitting: Hematology

## 2021-12-17 ENCOUNTER — Other Ambulatory Visit: Payer: Self-pay

## 2021-12-17 ENCOUNTER — Encounter (HOSPITAL_COMMUNITY): Payer: Self-pay | Admitting: Emergency Medicine

## 2021-12-17 DIAGNOSIS — Z794 Long term (current) use of insulin: Secondary | ICD-10-CM | POA: Insufficient documentation

## 2021-12-17 DIAGNOSIS — G620 Drug-induced polyneuropathy: Secondary | ICD-10-CM

## 2021-12-17 DIAGNOSIS — Z7902 Long term (current) use of antithrombotics/antiplatelets: Secondary | ICD-10-CM | POA: Insufficient documentation

## 2021-12-17 DIAGNOSIS — Z7984 Long term (current) use of oral hypoglycemic drugs: Secondary | ICD-10-CM | POA: Insufficient documentation

## 2021-12-17 DIAGNOSIS — G893 Neoplasm related pain (acute) (chronic): Secondary | ICD-10-CM

## 2021-12-17 DIAGNOSIS — T451X5A Adverse effect of antineoplastic and immunosuppressive drugs, initial encounter: Secondary | ICD-10-CM

## 2021-12-17 DIAGNOSIS — R000 Tachycardia, unspecified: Secondary | ICD-10-CM | POA: Diagnosis not present

## 2021-12-17 DIAGNOSIS — C7800 Secondary malignant neoplasm of unspecified lung: Secondary | ICD-10-CM | POA: Insufficient documentation

## 2021-12-17 DIAGNOSIS — Z7982 Long term (current) use of aspirin: Secondary | ICD-10-CM | POA: Diagnosis not present

## 2021-12-17 LAB — CBC WITH DIFFERENTIAL/PLATELET
Abs Immature Granulocytes: 0.04 10*3/uL (ref 0.00–0.07)
Basophils Absolute: 0.1 10*3/uL (ref 0.0–0.1)
Basophils Relative: 1 %
Eosinophils Absolute: 0 10*3/uL (ref 0.0–0.5)
Eosinophils Relative: 0 %
HCT: 34.4 % — ABNORMAL LOW (ref 36.0–46.0)
Hemoglobin: 10.2 g/dL — ABNORMAL LOW (ref 12.0–15.0)
Immature Granulocytes: 0 %
Lymphocytes Relative: 15 %
Lymphs Abs: 1.5 10*3/uL (ref 0.7–4.0)
MCH: 25.1 pg — ABNORMAL LOW (ref 26.0–34.0)
MCHC: 29.7 g/dL — ABNORMAL LOW (ref 30.0–36.0)
MCV: 84.5 fL (ref 80.0–100.0)
Monocytes Absolute: 0.9 10*3/uL (ref 0.1–1.0)
Monocytes Relative: 9 %
Neutro Abs: 7.6 10*3/uL (ref 1.7–7.7)
Neutrophils Relative %: 75 %
Platelets: 386 10*3/uL (ref 150–400)
RBC: 4.07 MIL/uL (ref 3.87–5.11)
RDW: 22 % — ABNORMAL HIGH (ref 11.5–15.5)
WBC: 10.1 10*3/uL (ref 4.0–10.5)
nRBC: 0 % (ref 0.0–0.2)

## 2021-12-17 LAB — COMPREHENSIVE METABOLIC PANEL
ALT: 20 U/L (ref 0–44)
AST: 19 U/L (ref 15–41)
Albumin: 2.2 g/dL — ABNORMAL LOW (ref 3.5–5.0)
Alkaline Phosphatase: 91 U/L (ref 38–126)
Anion gap: 6 (ref 5–15)
BUN: 6 mg/dL — ABNORMAL LOW (ref 8–23)
CO2: 24 mmol/L (ref 22–32)
Calcium: 9.8 mg/dL (ref 8.9–10.3)
Chloride: 105 mmol/L (ref 98–111)
Creatinine, Ser: 0.55 mg/dL (ref 0.44–1.00)
GFR, Estimated: >60 mL/min (ref >60)
Glucose, Bld: 186 mg/dL — ABNORMAL HIGH (ref 70–99)
Potassium: 4 mmol/L (ref 3.5–5.1)
Sodium: 135 mmol/L (ref 135–145)
Total Bilirubin: 0.6 mg/dL (ref 0.3–1.2)
Total Protein: 7.3 g/dL (ref 6.5–8.1)

## 2021-12-17 MED ORDER — SODIUM CHLORIDE 0.9 % IV BOLUS (SEPSIS)
1000.0000 mL | Freq: Once | INTRAVENOUS | Status: AC
  Administered 2021-12-17: 1000 mL via INTRAVENOUS

## 2021-12-17 MED ORDER — HEPARIN SOD (PORK) LOCK FLUSH 100 UNIT/ML IV SOLN
500.0000 [IU] | Freq: Once | INTRAVENOUS | Status: AC
  Administered 2021-12-17: 500 [IU]
  Filled 2021-12-17: qty 5

## 2021-12-17 MED ORDER — HYDROMORPHONE HCL 2 MG PO TABS: 2.0000 mg | ORAL_TABLET | Freq: Four times a day (QID) | ORAL | 0 refills | Status: DC | PRN

## 2021-12-17 MED ORDER — SODIUM CHLORIDE 0.9 % IV BOLUS
500.0000 mL | Freq: Once | INTRAVENOUS | Status: AC
  Administered 2021-12-17: 500 mL via INTRAVENOUS

## 2021-12-17 MED ORDER — ACETAMINOPHEN 325 MG PO TABS
650.0000 mg | ORAL_TABLET | Freq: Once | ORAL | Status: AC
  Administered 2021-12-17: 650 mg via ORAL
  Filled 2021-12-17: qty 2

## 2021-12-17 MED ORDER — SODIUM CHLORIDE 0.9 % IV SOLN: 1000.0000 mL | INTRAVENOUS | Status: DC

## 2021-12-17 MED ORDER — HYDROMORPHONE HCL 1 MG/ML IJ SOLN
1.0000 mg | Freq: Once | INTRAMUSCULAR | Status: AC
  Administered 2021-12-17: 1 mg via INTRAVENOUS
  Filled 2021-12-17: qty 1

## 2021-12-17 MED ORDER — SODIUM CHLORIDE 0.9 % IV BOLUS: 500.0000 mL | Freq: Once | INTRAVENOUS | Status: DC

## 2021-12-17 NOTE — Discharge Instructions (Addendum)
Take your regular pain medication when you get home.  Do not mix pain medications

## 2021-12-17 NOTE — ED Triage Notes (Signed)
Patient c/o pain throughout body taht started at 12:30pm today and is progressively getting worse despite taking oxycodone 10mg  at 1pm. Patient has lung cancer. Patient's last Chemo treatment Sept 27th. Patient has decided to stop treatments. Patient to see PCP and case manager Wednesday about palliative care and advance directive. Patient wears 2L oxygen via [place] at all times.

## 2021-12-17 NOTE — ED Provider Notes (Signed)
[facility] EMERGENCY DEPARTMENT Provider Note   CSN: [id number] Arrival date & time: 12/17/21  1457     History  Chief Complaint  Patient presents with   Pain Management    [name] is a 66 y.o. female.  Patient complains of severe pain full body.  Patient has metastatic cancer.  She has been receiving chemotherapy here at [facility].  Patient reports she has been feeling bad since her last dosage of chemotherapy.  She and her family have decided that she is going to stop chemotherapy and choose palliative care.  Patient reports she is taking oxycodone 10 mg every 4 hours for pain without pain relief.  Patient denies any nausea or vomiting she denies any diarrhea she has not had any fever or chills she is normally on oxygen at home and is comfortable on 2 L currently.  Patient is requesting medication for pain.  The history is provided by the patient. No language interpreter was used.       Home Medications Prior to Admission medications   Medication Sig Start Date End Date Taking? Authorizing Provider  HYDROmorphone (DILAUDID) 2 MG tablet Take 1 tablet (2 mg total) by mouth every 6 (six) hours as needed for up to 5 days for severe pain. 12/17/21 12/22/21 Yes Fransico Meadow, PA-C  albuterol (PROVENTIL HFA;VENTOLIN HFA) 108 (90 BASE) MCG/ACT inhaler Inhale 2 puffs into the lungs every 6 (six) hours as needed for wheezing or shortness of breath.    [provider]  albuterol (PROVENTIL) (2.5 MG/3ML) 0.083% nebulizer solution Take 2.5 mg by nebulization every 6 (six) hours as needed for wheezing or shortness of breath.    [provider]  allopurinol (ZYLOPRIM) 300 MG tablet Take 300 mg by mouth daily.    [provider]  aspirin 81 MG tablet Take 81 mg by mouth daily.    [provider]  atorvastatin (LIPITOR) 20 MG tablet Take 20 mg by mouth at bedtime.    [provider]  B-D UF III MINI PEN NEEDLES 31G X 5 MM MISC Inject into the  skin 3 (three) times daily. 08/04/21   [provider]  benzonatate (TESSALON) 200 MG capsule Take 400 mg by mouth every 8 (eight) hours as needed. 11/22/21   [provider]  budesonide-formoterol (SYMBICORT) 160-4.5 MCG/ACT inhaler Inhale 2 puffs into the lungs in the morning and at bedtime.    [provider]  CARBOPLATIN IV Inject into the vein every 21 ( twenty-one) days. 09/20/21   [provider]  clopidogrel (PLAVIX) 75 MG tablet Take 75 mg by mouth daily.    [provider]  cycloSPORINE (RESTASIS) 0.05 % ophthalmic emulsion Place 1 drop into both eyes 2 (two) times daily.    [provider]  diphenhydrAMINE (BENADRYL) 25 MG tablet Take 25 mg by mouth daily as needed for allergies.    [provider]  doxepin (SINEQUAN) 75 MG capsule Take 75 mg by mouth at bedtime.    [provider]  esomeprazole (NEXIUM) 40 MG capsule Take 40 mg by mouth in the morning and at bedtime.    [provider]  gabapentin (NEURONTIN) 300 MG capsule Take 2 capsules (600 mg total) by mouth 3 (three) times daily. If 1 capsule 3 times daily is not helping, you can increase to 2 capsules 3 times daily. 10/19/21   Derek Jack, MD  glipiZIDE (GLUCOTROL XL) 2.5 MG 24 hr tablet Take 2.5 mg by mouth daily.  11/22/21   [provider]  glipiZIDE (GLUCOTROL) 10 MG tablet Take 20 mg by mouth 2 (two) times daily before a meal.    [provider]  HYDROcodone bit-homatropine (HYCODAN) 5-1.5 MG/5ML syrup Take 5 mLs by mouth every 6 (six) hours as needed. 10/03/21   [provider]  hyoscyamine (LEVSIN SL) 0.125 MG SL tablet Place 1 tablet (0.125 mg total) under the tongue every 6 (six) hours as needed for cramping. 06/26/21   Erenest Rasher, PA-C  ibuprofen (ADVIL) 200 MG tablet Take 400 mg by mouth every 6 (six) hours as needed for moderate pain.    [provider]  Insulin Detemir (LEVEMIR [place]) Inject into the  skin.    [provider]  Lancets (ONETOUCH DELICA PLUS QASTMH96Q) [place] Apply topically. 08/17/21   [provider]  loperamide (IMODIUM) 2 MG capsule Take 1 capsule (2 mg total) by mouth as needed for diarrhea or loose stools. Take 2 capsules after the first loose stool of the day.  For each subsequent loose stool take 1 capsule.  Do not take more than 8 capsules in a day. 09/25/21   Harriett Rush, PA-C  lubiprostone (AMITIZA) 8 MCG capsule TAKE 1 CAPSULE BY MOUTH  ONCE DAILY WITH BREAKFAST Patient taking differently: Take 8 mcg by mouth daily as needed for constipation. 08/27/19   Erenest Rasher, PA-C  magnesium oxide (MAG-OX) 400 (240 Mg) MG tablet Take 1 tablet (400 mg total) by mouth 2 (two) times daily. 11/01/21   Derek Jack, MD  MELATONIN PO Take 1 capsule by mouth at bedtime as needed (sleep).    [provider]  methocarbamol (ROBAXIN) 500 MG tablet Take 500 mg by mouth every 6 (six) hours as needed for muscle spasms. 06/14/21   [provider]  metoprolol succinate (TOPROL-XL) 50 MG 24 hr tablet Take 1 tablet (50 mg total) by mouth every morning. 11/27/21   Derek Jack, MD  nicotine (NICODERM CQ - DOSED IN MG/24 HOURS) 21 mg/24hr patch Place 1 patch (21 mg total) onto the skin daily. 11/06/21   Barton Dubois, MD  ondansetron (ZOFRAN) 8 MG tablet Take 1 tablet (8 mg total) by mouth every 8 (eight) hours as needed for refractory nausea / vomiting. 09/25/21   Harriett Rush, PA-C  ONETOUCH ULTRA test strip  08/17/21   [provider]  PACLITAXEL IV Inject into the vein every 21 ( twenty-one) days. 09/20/21   [provider]  traMADol (ULTRAM) 50 MG tablet Take 50-100 mg by mouth every 6 (six) hours as needed for moderate pain.    [provider]  zolpidem (AMBIEN) 5 MG tablet Take 5 mg by mouth at bedtime as needed. 09/16/21   [provider]  prochlorperazine (COMPAZINE) 10 MG tablet Take 1 tablet  (10 mg total) by mouth every 6 (six) hours as needed (Nausea or vomiting). 09/19/21 10/31/21  Derek Jack, MD      Allergies    Docetaxel, Lisinopril, Aspirin, Propoxyphene n-acetaminophen, Zithromax [azithromycin], and Erythromycin    Review of Systems   Review of Systems  All other systems reviewed and are negative.   Physical Exam Updated Vital Signs BP 121/75   Pulse (!) 121   Temp 98.3 F (36.8 C) (Oral)   Resp (!) 21   Ht 5' 1.5" (1.562 m)   Wt 72.8 kg   SpO2 96%   BMI 29.82 kg/m  Physical Exam Vitals and nursing note reviewed.  Constitutional:  Appearance: She is well-developed.  HENT:     Head: Normocephalic.  Cardiovascular:     Rate and Rhythm: Tachycardia present.  Pulmonary:     Effort: Pulmonary effort is normal.  Abdominal:     General: There is no distension.  Musculoskeletal:        General: Normal range of motion.     Cervical back: Normal range of motion.  Neurological:     Mental Status: She is alert and oriented to person, place, and time.     ED Results / Procedures / Treatments   Labs (all labs ordered are listed, but only abnormal results are displayed) Labs Reviewed  CBC WITH DIFFERENTIAL/PLATELET - Abnormal; Notable for the following components:      Result Value   Hemoglobin 10.2 (*)    HCT 34.4 (*)    MCH 25.1 (*)    MCHC 29.7 (*)    RDW 22.0 (*)    All other components within normal limits  COMPREHENSIVE METABOLIC PANEL - Abnormal; Notable for the following components:   Glucose, Bld 186 (*)    BUN 6 (*)    Albumin 2.2 (*)    All other components within normal limits    EKG None  Radiology No results found.  Procedures Procedures    Medications Ordered in ED Medications  sodium chloride 0.9 % bolus 1,000 mL (0 mLs Intravenous Stopped 12/17/21 1959)    Followed by  0.9 %  sodium chloride infusion (1,000 mLs Intravenous Not Given 12/17/21 1959)  heparin lock flush 100 unit/mL (has no administration in  time range)  HYDROmorphone (DILAUDID) injection 1 mg (1 mg Intravenous Given 12/17/21 1809)  acetaminophen (TYLENOL) tablet 650 mg (650 mg Oral Given 12/17/21 1728)  sodium chloride 0.9 % bolus 500 mL (500 mLs Intravenous New Bag/Given 12/17/21 1959)    ED Course/ Medical Decision Making/ A&P                           Medical Decision Making Patient request medication for pain she has metastatic lung cancer.  Patient is on home O2.  Amount and/or Complexity of Data Reviewed Independent Historian: caregiver    Details: Patient is here with her family member who is supportive External Data Reviewed: notes.    Details: Oncology notes reviewed Labs: ordered. Decision-making details documented in ED Course.    Details: Labs ordered reviewed and interpreted.  Hemoglobin is 10.2 ECG/medicine tests: ordered and independent interpretation performed. Decision-making details documented in ED Course.    Details: EKG shows tachycardia  Risk OTC drugs. Prescription drug management. Parenteral controlled substances. Risk Details: Patient given IV fluids x1.5 L.  Patient is given Dilaudid 1 mg IV and Zofran IV.  Patient is given a prescription for Dilaudid 2 mg tablets #10 for home pain management           Final Clinical Impression(s) / ED Diagnoses Final diagnoses:  None    Rx / DC Orders ED Discharge Orders          Ordered    HYDROmorphone (DILAUDID) 2 MG tablet  Every 6 hours PRN       Note to Pharmacy: Pt has rx for oxycodone.  Pt has metastatic cancer and plans to begin palliative care this week.   12/17/21 1919           An After Visit Summary was printed and given to the patient.    Fransico Meadow, [place]  12/17/21 2028    Noemi Chapel, MD 12/19/21 [phone]

## 2021-12-18 ENCOUNTER — Encounter (HOSPITAL_COMMUNITY): Payer: Self-pay | Admitting: Emergency Medicine

## 2021-12-20 ENCOUNTER — Other Ambulatory Visit: Payer: Self-pay | Admitting: *Deleted

## 2021-12-20 ENCOUNTER — Inpatient Hospital Stay: Payer: Medicare Other

## 2021-12-20 ENCOUNTER — Inpatient Hospital Stay (HOSPITAL_BASED_OUTPATIENT_CLINIC_OR_DEPARTMENT_OTHER): Payer: Medicare Other | Admitting: Hematology

## 2021-12-20 DIAGNOSIS — C787 Secondary malignant neoplasm of liver and intrahepatic bile duct: Secondary | ICD-10-CM | POA: Diagnosis not present

## 2021-12-20 DIAGNOSIS — C3490 Malignant neoplasm of unspecified part of unspecified bronchus or lung: Secondary | ICD-10-CM

## 2021-12-20 DIAGNOSIS — Z95828 Presence of other vascular implants and grafts: Secondary | ICD-10-CM

## 2021-12-20 LAB — CBC WITH DIFFERENTIAL/PLATELET
Abs Immature Granulocytes: 0.03 10*3/uL (ref 0.00–0.07)
Basophils Absolute: 0.1 10*3/uL (ref 0.0–0.1)
Basophils Relative: 1 %
Eosinophils Absolute: 0.1 10*3/uL (ref 0.0–0.5)
Eosinophils Relative: 1 %
HCT: 34 % — ABNORMAL LOW (ref 36.0–46.0)
Hemoglobin: 10 g/dL — ABNORMAL LOW (ref 12.0–15.0)
Immature Granulocytes: 0 %
Lymphocytes Relative: 19 %
Lymphs Abs: 1.9 10*3/uL (ref 0.7–4.0)
MCH: 25.3 pg — ABNORMAL LOW (ref 26.0–34.0)
MCHC: 29.4 g/dL — ABNORMAL LOW (ref 30.0–36.0)
MCV: 86.1 fL (ref 80.0–100.0)
Monocytes Absolute: 0.9 10*3/uL (ref 0.1–1.0)
Monocytes Relative: 10 %
Neutro Abs: 6.8 10*3/uL (ref 1.7–7.7)
Neutrophils Relative %: 69 %
Platelets: 451 10*3/uL — ABNORMAL HIGH (ref 150–400)
RBC: 3.95 MIL/uL (ref 3.87–5.11)
RDW: 21.7 % — ABNORMAL HIGH (ref 11.5–15.5)
WBC Morphology: REACTIVE
WBC: 9.9 10*3/uL (ref 4.0–10.5)
nRBC: 0.2 % (ref 0.0–0.2)

## 2021-12-20 LAB — COMPREHENSIVE METABOLIC PANEL
ALT: 16 U/L (ref 0–44)
AST: 18 U/L (ref 15–41)
Albumin: 2.1 g/dL — ABNORMAL LOW (ref 3.5–5.0)
Alkaline Phosphatase: 83 U/L (ref 38–126)
Anion gap: 9 (ref 5–15)
BUN: 6 mg/dL — ABNORMAL LOW (ref 8–23)
CO2: 24 mmol/L (ref 22–32)
Calcium: 10 mg/dL (ref 8.9–10.3)
Chloride: 104 mmol/L (ref 98–111)
Creatinine, Ser: 0.54 mg/dL (ref 0.44–1.00)
GFR, Estimated: >60 mL/min (ref >60)
Glucose, Bld: 104 mg/dL — ABNORMAL HIGH (ref 70–99)
Potassium: 3.4 mmol/L — ABNORMAL LOW (ref 3.5–5.1)
Sodium: 137 mmol/L (ref 135–145)
Total Bilirubin: 0.4 mg/dL (ref 0.3–1.2)
Total Protein: 7 g/dL (ref 6.5–8.1)

## 2021-12-20 LAB — MAGNESIUM: Magnesium: 1.5 mg/dL — ABNORMAL LOW (ref 1.7–2.4)

## 2021-12-20 MED ORDER — POTASSIUM CHLORIDE IN NACL 20-0.9 MEQ/L-% IV SOLN
Freq: Once | INTRAVENOUS | Status: AC
  Filled 2021-12-20: qty 1000

## 2021-12-20 MED ORDER — HEPARIN SOD (PORK) LOCK FLUSH 100 UNIT/ML IV SOLN
500.0000 [IU] | Freq: Once | INTRAVENOUS | Status: AC
  Administered 2021-12-20: 500 [IU] via INTRAVENOUS

## 2021-12-20 MED ORDER — MAGNESIUM SULFATE 2 GM/50ML IV SOLN
2.0000 g | Freq: Once | INTRAVENOUS | Status: AC
  Administered 2021-12-20: 2 g via INTRAVENOUS
  Filled 2021-12-20: qty 50

## 2021-12-20 MED ORDER — HYDROMORPHONE HCL 2 MG PO TABS: 1.0000 mg | ORAL_TABLET | ORAL | 0 refills | Status: DC | PRN

## 2021-12-20 MED ORDER — SODIUM CHLORIDE 0.9 % IV SOLN: INTRAVENOUS | Status: DC

## 2021-12-20 MED ORDER — SODIUM CHLORIDE 0.9% FLUSH
10.0000 mL | Freq: Once | INTRAVENOUS | Status: AC
  Administered 2021-12-20: 10 mL via INTRAVENOUS

## 2021-12-20 NOTE — Progress Notes (Signed)
Patient wishes to stop treatment, per Dr. Delton Coombes give house fluids over 2hrs. Patient tolerated therapy with no complaints voiced. Side effects with management reviewed with understanding verbalized. Port site clean and dry with no bruising or swelling noted at site. Good blood return noted before and after administration of therapy. Band aid applied. Patient left in satisfactory condition with VSS and no s/s of distress noted.

## 2021-12-20 NOTE — Patient Instructions (Addendum)
[facility] at [facility] Discharge Instructions   You were seen and examined today by Dr. Delton Coombes.  He reviewed the results of your lab work which are normal/stable.  We discussed stopping treatment since the last treatment left you feeling so poorly.   We will make a referral to hospice. They will help keep you comfortable and control your pain.   Return to the clinic as needed. We will not schedule any follow up appointments her at this time.    Thank you for choosing [facility] at [facility] to provide your oncology and hematology care.  To afford each patient quality time with our provider, please arrive at least 15 minutes before your scheduled appointment time.   If you have a lab appointment with the [facility] please come in thru the Main Entrance and check in at the main information desk.  You need to re-schedule your appointment should you arrive 10 or more minutes late.  We strive to give you quality time with our providers, and arriving late affects you and other patients whose appointments are after yours.  Also, if you no show three or more times for appointments you may be dismissed from the clinic at the providers discretion.     Again, thank you for choosing [facility].  Our hope is that these requests will decrease the amount of time that you wait before being seen by our physicians.       _____________________________________________________________  Should you have questions after your visit to [facility], please contact our office at [phone] and follow the prompts.  Our office hours are 8:00 a.m. and 4:30 p.m. Monday - Friday.  Please note that voicemails left after 4:00 p.m. may not be returned until the following business day.  We are closed weekends and major holidays.  You do have access to a nurse 24-7, just call the main number to the clinic [phone] and do not press any  options, hold on the line and a nurse will answer the phone.    For prescription refill requests, have your pharmacy contact our office and allow 72 hours.    Due to Covid, you will need to wear a mask upon entering the hospital. If you do not have a mask, a mask will be given to you at the Main Entrance upon arrival. For doctor visits, patients may have 1 support person age 49 or older with them. For treatment visits, patients can not have anyone with them due to social distancing guidelines and our immunocompromised population.

## 2021-12-20 NOTE — Patient Instructions (Signed)
[facility]  Discharge Instructions: Thank you for choosing [facility] to provide your oncology and hematology care.  If you have a lab appointment with the [facility], please come in thru the Main Entrance and check in at the main information desk.  Wear comfortable clothing and clothing appropriate for easy access to any Portacath or PICC line.   We strive to give you quality time with your provider. You may need to reschedule your appointment if you arrive late (15 or more minutes).  Arriving late affects you and other patients whose appointments are after yours.  Also, if you miss three or more appointments without notifying the office, you may be dismissed from the clinic at the provider's discretion.      For prescription refill requests, have your pharmacy contact our office and allow 72 hours for refills to be completed.    Today you received the following house fluids, return as scheduled.   To help prevent nausea and vomiting after your treatment, we encourage you to take your nausea medication as directed.  BELOW ARE SYMPTOMS THAT SHOULD BE REPORTED IMMEDIATELY: *FEVER GREATER THAN 100.4 F (38 C) OR HIGHER *CHILLS OR SWEATING *NAUSEA AND VOMITING THAT IS NOT CONTROLLED WITH YOUR NAUSEA MEDICATION *UNUSUAL SHORTNESS OF BREATH *UNUSUAL BRUISING OR BLEEDING *URINARY PROBLEMS (pain or burning when urinating, or frequent urination) *BOWEL PROBLEMS (unusual diarrhea, constipation, pain near the anus) TENDERNESS IN MOUTH AND THROAT WITH OR WITHOUT PRESENCE OF ULCERS (sore throat, sores in mouth, or a toothache) UNUSUAL RASH, SWELLING OR PAIN  UNUSUAL VAGINAL DISCHARGE OR ITCHING   Items with * indicate a potential emergency and should be followed up as soon as possible or go to the Emergency Department if any problems should occur.  Please show the CHEMOTHERAPY ALERT CARD or IMMUNOTHERAPY ALERT CARD at check-in to the Emergency Department and  triage nurse.  Should you have questions after your visit or need to cancel or reschedule your appointment, please contact [facility] [phone]  and follow the prompts.  Office hours are 8:00 a.m. to 4:30 p.m. Monday - Friday. Please note that voicemails left after 4:00 p.m. may not be returned until the following business day.  We are closed weekends and major holidays. You have access to a nurse at all times for urgent questions. Please call the main number to the clinic [phone] and follow the prompts.  For any non-urgent questions, you may also contact your provider using MyChart. We now offer e-Visits for anyone 34 and older to request care online for non-urgent symptoms. For details visit [email or web address].   Also download the MyChart app! Go to the app store, search "MyChart", open the app, select [facility], and log in with your MyChart username and password.  Masks are optional in the cancer centers. If you would like for your care team to wear a mask while they are taking care of you, please let them know. You may have one support person who is at least 68 years old accompany you for your appointments.

## 2021-12-20 NOTE — Progress Notes (Signed)
[facility], [place] [address]   CLINIC:  Medical Oncology/Hematology  PCP:  Brittney Chroman, MD [address] / [place] [address] [phone]   REASON FOR VISIT:  Follow-up for metastatic squamous cell lung cancer to the liver  PRIOR THERAPY: Carboplatin and paclitaxel with progression  NGS Results: PD-L1 negative, no targetable mutations.  CURRENT THERAPY: Docetaxel and Cyramza  BRIEF ONCOLOGIC HISTORY:  Oncology History  Squamous cell lung cancer, unspecified laterality ([place])  09/13/2021 Initial Diagnosis   Squamous cell lung cancer, unspecified laterality ([place])   09/20/2021 - 10/13/2021 Chemotherapy   Patient is on Treatment Plan : LUNG NSCLC Carboplatin / Paclitaxel q21d x 6 cycles     09/20/2021 - 11/03/2021 Chemotherapy   Patient is on Treatment Plan : LUNG NSCLC Carboplatin + Paclitaxel q21d X 6 Cycles     11/29/2021 -  Chemotherapy   Patient is on Treatment Plan : LUNG Docetaxel + Ramucirumab q21d        CANCER STAGING:  Cancer Staging  Squamous cell lung cancer, unspecified laterality ([place]) Staging form: Lung, AJCC 8th Edition - Clinical stage from 09/13/2021: Stage IVB (cTX, cN3, pM1c) - Unsigned  INTERVAL HISTORY:  [name], a 66 y.o. female, seen for follow-up of metastatic squamous cell carcinoma.  She has received first cycle of docetaxel and Cyramza 3 weeks ago.  She reported hurting pains all over the body.  She was given oxycodone in the ER which did not help.  She was seen by her primary doctor and was started on Dilaudid 1 mg every 6 hours which is helping.  She reports decreased eating.  Drinking about 3 ensures per day and eating 1 meal per day.  She is becoming very weak.  REVIEW OF SYSTEMS:  Review of Systems  Constitutional:  Negative for appetite change and fatigue.  HENT:   Positive for trouble swallowing.   Respiratory:  Positive for cough and shortness of breath.   Gastrointestinal:  Positive for  constipation and nausea.  Neurological:  Positive for numbness (Constant numbness in the toes and fingertips).  All other systems reviewed and are negative.   PAST MEDICAL/SURGICAL HISTORY:  Past Medical History:  Diagnosis Date   Abdominal aortic stenosis    Anal fissure    Anxiety    Asthma    Complication of anesthesia    COPD (chronic obstructive pulmonary disease) ([place])    Depression    grief   Diabetes mellitus    Diverticulitis    GERD (gastroesophageal reflux disease)    Glaucoma    Gout    Hypertension    IBS (irritable bowel syndrome)    PONV (postoperative nausea and vomiting)    Port-A-Cath in place 09/19/2021   RA (rheumatoid arthritis) ([place])    Sleep apnea    had one at one time but not anymore   Stroke ([place])    "mini-stroke"- no deficits   TIA (transient ischemic attack)    2010. No deficits   Past Surgical History:  Procedure Laterality Date   ABDOMINAL HYSTERECTOMY  1982   complete   BIOPSY  11/17/2018   Procedure: BIOPSY;  Surgeon: Brittney Dolin, MD;  [facility];  Service: Endoscopy;;   BREAST LUMPECTOMY     right-benign   CATARACT EXTRACTION W/PHACO Right 05/13/2017   Procedure: CATARACT EXTRACTION PHACO AND INTRAOCULAR LENS PLACEMENT ([place]);  Surgeon: Brittney Branch, MD;  Location: [facility];  Service: Ophthalmology;  Laterality: Right;  CDE: 8.59   CATARACT EXTRACTION W/PHACO Left 05/27/2017   Procedure: CATARACT EXTRACTION WITH  PHACOEMULSIFICATION AND INTRAOCULAR LENS PLACEMENT LEFT EYE;  Surgeon: Brittney Branch, MD;  Location: [facility];  Service: Ophthalmology;  Laterality: Left;  CDE: 5.66   CHOLECYSTECTOMY  2003   COLONOSCOPY  12/14/09   Dr. Gala Martinez :anal papilla and hemorrhoids,diminutive hperplastic rectal polyps/normal colon   COLONOSCOPY N/A 10/29/2013   QVZ:DGLO papilla and internal hemorrhoids; colonic polyps-removed as described above. I suspect benign anorectal bleeding in the setting of hemorrhoids and  possibly fissure. tubular adenoma.  next TCS 10/2020.   COLONOSCOPY WITH PROPOFOL N/A 11/17/2018   Dr. Gala Martinez: Nonbleeding internal hemorrhoids, hemorrhoids were moderate and grade.  Exam otherwise normal.  Colonoscopy in 5 years.   ELBOW SURGERY     right   ESOPHAGOGASTRODUODENOSCOPY  12/14/09   Dr. Gala Martinez :schatzkis ring 38F, otherwise normal   ESOPHAGOGASTRODUODENOSCOPY N/A 10/29/2013   RMR: Distal esophageal pseudodiverticulum/Nissen fundoplication   ESOPHAGOGASTRODUODENOSCOPY (EGD) WITH PROPOFOL N/A 11/17/2018   Dr. Gala Martinez: Normal esophagus status post dilation.  Nonbleeding erosive gastropathy with benign biopsy.   FINGER SURGERY     on right middle, pointer, and index fingers   FISSURECTOMY     several   INCISIONAL HERNIA REPAIR N/A 03/22/2014   Procedure: Brittney Martinez HERNIORRHAPHY;  Surgeon: Brittney So, MD;  Location: [facility];  Service: General;  Laterality: N/A;   INSERTION OF MESH N/A 03/22/2014   Procedure: INSERTION OF MESH;  Surgeon: Brittney So, MD;  Location: [facility];  Service: General;  Laterality: N/A;   MALONEY DILATION N/A 11/17/2018   Procedure: Brittney Martinez DILATION;  Surgeon: Brittney Dolin, MD;  Location: [facility];  Service: Endoscopy;  Laterality: N/A;   NISSEN FUNDOPLICATION  [id number]   [facility]   PORTACATH PLACEMENT Left 07/12/2021   Procedure: INSERTION PORT-A-CATH;  Surgeon: Brittney Cagey, MD;  Location: [facility];  Service: General;  Laterality: Left;   WRIST SURGERY     left    SOCIAL HISTORY:  Social History   Socioeconomic History   Marital status: Widowed    Spouse name: Not on file   Number of children: Not on file   Years of education: Not on file   Highest education level: Not on file  Occupational History   Occupation: disabled  Tobacco Use   Smoking status: Former    Packs/day: 0.25    Years: 19.00    Total pack years: 4.75    Types: Cigarettes    Quit date: 01/09/2007    Years since quitting: 14.9   Smokeless tobacco: Never  Vaping Use   Vaping Use: Never used   Substance and Sexual Activity   Alcohol use: No   Drug use: No   Sexual activity: Never  Other Topics Concern   Not on file  Social History Narrative   ** Merged History Encounter **       1 son-healthy   1 daughter-MVA (drunk-driver)   Social Determinants of Health   Financial Resource Strain: High Risk (11/09/2021)   Overall Financial Resource Strain (CARDIA)    Difficulty of Paying Living Expenses: Very hard  Food Insecurity: Not on file  Transportation Needs: Not on file  Physical Activity: Not on file  Stress: Not on file  Social Connections: Not on file  Intimate Partner Violence: Not on file    FAMILY HISTORY:  Family History  Problem Relation Age of Onset   Colon cancer Father 31  Hypertension Sister    Hypertension Brother    Kidney disease Brother     CURRENT MEDICATIONS:  Current Outpatient Medications  Medication Sig Dispense Refill   albuterol (PROVENTIL HFA;VENTOLIN HFA) 108 (90 BASE) MCG/ACT inhaler Inhale 2 puffs into the lungs every 6 (six) hours as needed for wheezing or shortness of breath.     albuterol (PROVENTIL) (2.5 MG/3ML) 0.083% nebulizer solution Take 2.5 mg by nebulization every 6 (six) hours as needed for wheezing or shortness of breath.     allopurinol (ZYLOPRIM) 300 MG tablet Take 300 mg by mouth daily.     aspirin 81 MG tablet Take 81 mg by mouth daily.     atorvastatin (LIPITOR) 20 MG tablet Take 20 mg by mouth at bedtime.     B-D UF III MINI PEN NEEDLES 31G X 5 MM MISC Inject into the skin 3 (three) times daily.     benzonatate (TESSALON) 200 MG capsule Take 400 mg by mouth every 8 (eight) hours as needed.     budesonide-formoterol (SYMBICORT) 160-4.5 MCG/ACT inhaler Inhale 2 puffs into the lungs in the morning and at bedtime.     CARBOPLATIN IV Inject into the vein every 21 ( twenty-one) days.     clopidogrel (PLAVIX) 75 MG tablet Take 75 mg by mouth daily.     cycloSPORINE (RESTASIS) 0.05 % ophthalmic emulsion Place 1 drop into both  eyes 2 (two) times daily.     diphenhydrAMINE (BENADRYL) 25 MG tablet Take 25 mg by mouth daily as needed for allergies.     doxepin (SINEQUAN) 75 MG capsule Take 75 mg by mouth at bedtime.     esomeprazole (NEXIUM) 40 MG capsule Take 40 mg by mouth in the morning and at bedtime.     gabapentin (NEURONTIN) 300 MG capsule TAKE 2 CAPSULES BY MOUTH THREE TIMES DAILY (IF  1  CAP  3  TIMES  DAILY  IS  NOT  HELPING,  MAY  INCREASE  TO  2  CAPS  3  TIMES  DAILY) 180 capsule 0   glipiZIDE (GLUCOTROL XL) 2.5 MG 24 hr tablet Take 2.5 mg by mouth daily.     glipiZIDE (GLUCOTROL) 10 MG tablet Take 20 mg by mouth 2 (two) times daily before a meal.     HYDROcodone bit-homatropine (HYCODAN) 5-1.5 MG/5ML syrup Take 5 mLs by mouth every 6 (six) hours as needed.     HYDROmorphone (DILAUDID) 2 MG tablet Take 1 tablet (2 mg total) by mouth every 6 (six) hours as needed for up to 5 days for severe pain. 10 tablet 0   hyoscyamine (LEVSIN SL) 0.125 MG SL tablet Place 1 tablet (0.125 mg total) under the tongue every 6 (six) hours as needed for cramping. 90 tablet 0   ibuprofen (ADVIL) 200 MG tablet Take 400 mg by mouth every 6 (six) hours as needed for moderate pain.     Insulin Detemir (LEVEMIR [place]) Inject into the skin.     Lancets (ONETOUCH DELICA PLUS [id number]) MISC Apply topically.     loperamide (IMODIUM) 2 MG capsule Take 1 capsule (2 mg total) by mouth as needed for diarrhea or loose stools. Take 2 capsules after the first loose stool of the day.  For each subsequent loose stool take 1 capsule.  Do not take more than 8 capsules in a day. 60 capsule 0   lubiprostone (AMITIZA) 8 MCG capsule TAKE 1 CAPSULE BY MOUTH  ONCE DAILY WITH BREAKFAST (Patient taking differently: Take 8 mcg  by mouth daily as needed for constipation.) 90 capsule 3   magnesium oxide (MAG-OX) 400 (240 Mg) MG tablet Take 1 tablet (400 mg total) by mouth 2 (two) times daily. 60 tablet 3   MELATONIN PO Take 1 capsule by mouth at bedtime as needed  (sleep).     methocarbamol (ROBAXIN) 500 MG tablet Take 500 mg by mouth every 6 (six) hours as needed for muscle spasms.     metoprolol succinate (TOPROL-XL) 50 MG 24 hr tablet Take 1 tablet (50 mg total) by mouth every morning. 30 tablet 1   nicotine (NICODERM CQ - DOSED IN MG/24 HOURS) 21 mg/24hr patch Place 1 patch (21 mg total) onto the skin daily. 28 patch 0   ondansetron (ZOFRAN) 8 MG tablet Take 1 tablet (8 mg total) by mouth every 8 (eight) hours as needed for refractory nausea / vomiting. 30 tablet 0   ONETOUCH ULTRA test strip      PACLITAXEL IV Inject into the vein every 21 ( twenty-one) days.     traMADol (ULTRAM) 50 MG tablet Take 50-100 mg by mouth every 6 (six) hours as needed for moderate pain.     zolpidem (AMBIEN) 5 MG tablet Take 5 mg by mouth at bedtime as needed.     No current facility-administered medications for this visit.   Facility-Administered Medications Ordered in Other Visits  Medication Dose Route Frequency Provider Last Rate Last Admin   0.9 %  sodium chloride infusion   Intravenous Continuous Pennington, Rebekah M, PA-C 500 mL/hr at 09/25/21 1252 New Bag at 09/25/21 1252   diphenhydrAMINE (BENADRYL) 50 MG/ML injection            famotidine (PEPCID) 20-0.9 MG/50ML-% IVPB            palonosetron (ALOXI) 0.25 MG/5ML injection             ALLERGIES:  Allergies  Allergen Reactions   Docetaxel Shortness Of Breath   Lisinopril Anaphylaxis    Closing of throat   Aspirin Nausea Only   Propoxyphene N-Acetaminophen Nausea And Vomiting   Zithromax [Azithromycin] Nausea Only   Erythromycin Rash    PHYSICAL EXAM:  Performance status (ECOG): 1 - Symptomatic but completely ambulatory  There were no vitals filed for this visit. Wt Readings from Last 3 Encounters:  12/17/21 160 lb 6.4 oz (72.8 kg)  11/21/21 164 lb 6.4 oz (74.6 kg)  11/04/21 168 lb 10.4 oz (76.5 kg)   Physical Exam Vitals reviewed.  Constitutional:      Appearance: Normal appearance. She is  obese.  Cardiovascular:     Rate and Rhythm: Normal rate and regular rhythm.     Pulses: Normal pulses.     Heart sounds: Normal heart sounds.  Pulmonary:     Effort: Pulmonary effort is normal.     Breath sounds: Normal breath sounds.  Neurological:     General: No focal deficit present.     Mental Status: She is alert and oriented to person, place, and time.  Psychiatric:        Mood and Affect: Mood normal.        Behavior: Behavior normal.     LABORATORY DATA:  I have reviewed the labs as listed.     Latest Ref Rng & Units 12/17/2021    4:52 PM 12/07/2021   10:33 AM 11/29/2021    8:24 AM  CBC  WBC 4.0 - 10.5 K/uL 10.1  7.1  12.9   Hemoglobin 12.0 - 15.0  g/dL 10.2  9.2  9.6   Hematocrit 36.0 - 46.0 % 34.4  30.7  31.5   Platelets 150 - 400 K/uL 386  345  403       Latest Ref Rng & Units 12/17/2021    4:52 PM 12/07/2021   10:33 AM 11/29/2021    8:24 AM  CMP  Glucose 70 - 99 mg/dL 186  47  152   BUN 8 - 23 mg/dL 6  9  13    Creatinine 0.44 - 1.00 mg/dL 0.55  0.57  0.63   Sodium 135 - 145 mmol/L 135  140  135   Potassium 3.5 - 5.1 mmol/L 4.0  3.4  3.8   Chloride 98 - 111 mmol/L 105  103  102   CO2 22 - 32 mmol/L 24  26  24    Calcium 8.9 - 10.3 mg/dL 9.8  10.4  11.2   Total Protein 6.5 - 8.1 g/dL 7.3  7.3  7.7   Total Bilirubin 0.3 - 1.2 mg/dL 0.6  0.5  0.6   Alkaline Phos 38 - 126 U/L 91  126  107   AST 15 - 41 U/L 19  30  24    ALT 0 - 44 U/L 20  31  26      DIAGNOSTIC IMAGING:  I have independently reviewed the scans and discussed with the patient. No results found.   ASSESSMENT:  Liver masses/mediastinal and left supraclavicular adenopathy: - She has felt left supraclavicular lymph node since November 2022. - She reported 26 pound weight loss in the last 6 to 8 months.  She reports she has been on Ozempic for the last 8 months.  Denies any fevers or night sweats although reports severe tiredness which is worsening.  No new pains. - She had a hysterectomy at age 74  due to cancer.  Unclear uterine or cervical.  Did not require any further therapies. - EGD/colonoscopy on 11/17/2018: Nonbleeding internal hemorrhoids, normal colon, normal esophagus, nonbleeding erosive gastropathy, normal duodenum. - CT soft tissue neck (05/24/2021): Cluster of mildly prominent lymph nodes in the lower neck bilaterally left greater than right. - CT CAP (05/24/2021): Mediastinal and left hilar adenopathy.  Numerous enhancing low-density lesions throughout the liver measuring up to 4.1 cm. - Liver biopsy (06/22/2021): Poorly differentiated carcinoma with necrosis.  IHC strong positivity for CK5/6, p63, PAX8.  Ki-67 with high proliferative rate.  Negative for CK7, CD56, CK20, chromogranin, synaptophysin, TTF-1, Napsin A, marked 31, ER, GATA3 and CDX2.  Differential includes renal or gynecological primary. - PET scan (06/22/2021): Hypermetabolic lymphadenopathy throughout the neck, chest, liver metastasis, 9 mm hypermetabolic lesion in the lower pole of the left kidney suspicious for small RCC. - Left supraclavicular lymph node biopsy: Metastatic poorly differentiated carcinoma, suggestive of squamous cell carcinoma.  Tumor positive for CK5/6 and p63. - NGS testing: HER2 negative.  T p53 pathogenic variant present.  TMB-low.  MSI-stable.  LOH-high at 23%.  PD-L1 (TJ030) negative. - NGS test suggest 97% probability of squamous cell carcinoma. - Based on imaging, this appears most likely squamous cell carcinoma of the lung origin.       -3 cycles of carboplatin and paclitaxel from 09/20/2021 through 11/01/2021 with progression    Social/family history: - She lives at home with her son and daughter-in-law.  She has been on disability secondary to IBS since 2001.  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  She is a current active smoker, 1/3  pack/day for the last 30 years.   PLAN:  Metastatic squamous cell lung cancer to the liver: - She has received first  cycle of docetaxel and Cyramza 3 weeks ago.  She did not tolerate it well as it caused more pains and weakness.  We discussed best supportive care in the form of hospice.  [facility] will make [facility].  I will change her pain medicine Dilaudid to 1 mg every 4 hours as needed.  She will receive IV fluids today.   2.  Dysphagia: - She is having dysphagia to solid foods.  We have scheduled her for swallow evaluation.  Continue ensures 3 cans/day.  Continue mechanical soft diet.  3.  Rheumatoid arthritis of hands and knees (13 years): - Previously treated with methotrexate and Humira.  Immunotherapy was not started due to possible reactivation.  4.  Neuropathy (numbness in the feet, legs and fingertips): - Gabapentin is currently on hold due to concurrent opioids.  I have told her that she may start back if she does not feel drowsy.        5.  Hypomagnesemia:        - Continue magnesium twice daily.  Magnesium today is low.  She will receive IV magnesium.  6.  Hypertension: - Continue Toprol-XL 50 mg daily.  Blood pressure is better controlled today.   Orders placed this encounter:  No orders of the defined types were placed in this encounter.     Derek Jack, MD [place] [phone]

## 2021-12-22 ENCOUNTER — Ambulatory Visit: Payer: Medicare Other

## 2021-12-27 ENCOUNTER — Telehealth: Payer: Self-pay | Admitting: *Deleted

## 2021-12-27 NOTE — Telephone Encounter (Signed)
     Patient  visit on 12/17/2021  at [facility] was for pain  Have you been able to follow up with your primary care physician?Yes has everything she needs   The patient was able to obtain any needed medicine or equipment.  Are there diet recommendations that you are having difficulty following?  Patient expresses understanding of discharge instructions and education provided has no other needs at this time.    [place] [phone] [address] , [place] [address] Email : [email or web address]

## 2022-01-02 ENCOUNTER — Encounter (HOSPITAL_COMMUNITY): Payer: Self-pay

## 2022-01-02 ENCOUNTER — Ambulatory Visit (HOSPITAL_COMMUNITY): Payer: Medicare Other | Attending: Hematology | Admitting: Speech Pathology

## 2022-01-02 ENCOUNTER — Inpatient Hospital Stay (HOSPITAL_COMMUNITY): Admission: RE | Admit: 2022-01-02 | Payer: Medicare Other | Source: Ambulatory Visit

## 2022-01-03 ENCOUNTER — Ambulatory Visit (HOSPITAL_COMMUNITY): Payer: Medicare Other

## 2022-01-03 ENCOUNTER — Ambulatory Visit: Payer: Medicare Other

## 2022-01-03 ENCOUNTER — Ambulatory Visit: Payer: Medicare Other | Admitting: Hematology

## 2022-01-03 ENCOUNTER — Other Ambulatory Visit: Payer: Medicare Other

## 2022-01-05 ENCOUNTER — Ambulatory Visit: Payer: Medicare Other

## 2022-01-10 ENCOUNTER — Other Ambulatory Visit: Payer: Medicare Other

## 2022-01-10 ENCOUNTER — Ambulatory Visit: Payer: Medicare Other | Admitting: Hematology

## 2022-01-10 ENCOUNTER — Ambulatory Visit: Payer: Medicare Other

## 2022-01-11 ENCOUNTER — Ambulatory Visit: Payer: Medicare Other | Admitting: Gastroenterology

## 2022-01-12 ENCOUNTER — Ambulatory Visit: Payer: Medicare Other

## 2022-01-31 ENCOUNTER — Other Ambulatory Visit: Payer: Medicare Other

## 2022-01-31 ENCOUNTER — Ambulatory Visit: Payer: Medicare Other | Admitting: Hematology

## 2022-01-31 ENCOUNTER — Ambulatory Visit: Payer: Medicare Other

## 2022-02-02 ENCOUNTER — Ambulatory Visit: Payer: Medicare Other

## 2022-02-02 DEATH — deceased

## 2022-10-05 ENCOUNTER — Encounter: Payer: Self-pay | Admitting: Hematology

## 2023-07-30 IMAGING — MR MR HEAD W/O CM
11 of 12 series · 41 of 48 positions shown · non-contrast
Comparison: Head CT 10/03/2009.  Brain MRI 08/01/2008.

CLINICAL DATA: Lacunar infarction. Other cerebral infarction due to
occlusion or stenosis of small artery. Additional history provided
by scanning technologist: Patient reports right leg weakness and
dizziness for 6 weeks.

EXAM:
MRI HEAD WITHOUT CONTRAST
TECHNIQUE: Multiplanar, multiecho pulse sequences of the brain and surrounding
structures were obtained without intravenous contrast.

[Series 5: DWI · axial · 4.0mm · 0.88mm/px · z∈[-35,+104]mm · 4 of 36 slices shown (1 of 6)]
[im 1/36]
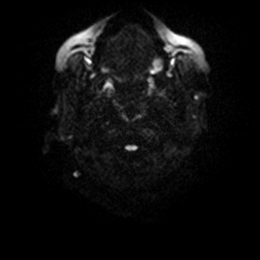
[im 12/36]
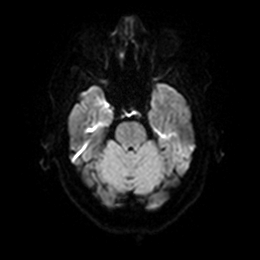
[im 24/36]
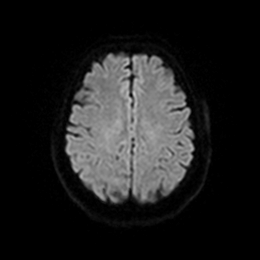
[im 36/36]
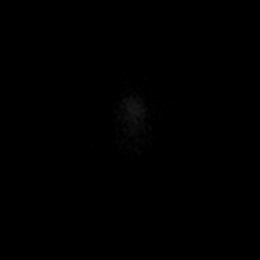

[Series 5: DWI · axial · 4.0mm · 0.88mm/px · z∈[-35,+104]mm · 4 of 36 slices shown (2 of 6)]
[im 1/36]
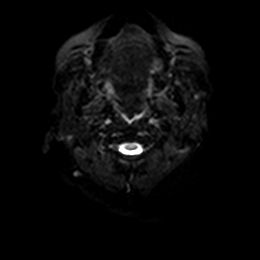
[im 12/36]
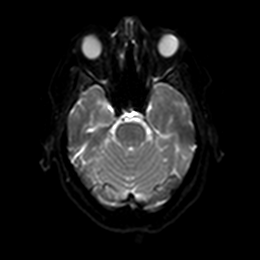
[im 24/36]
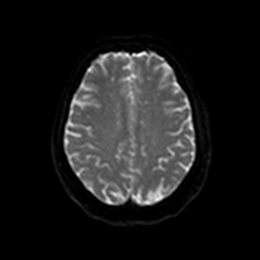
[im 36/36]
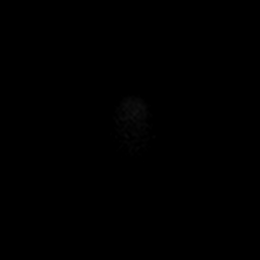

[Series 6: DWI · axial · 4.0mm · 0.88mm/px · z∈[-35,+104]mm · 4 of 36 slices shown (3 of 6)]
[im 1/36]
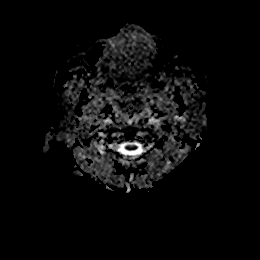
[im 12/36]
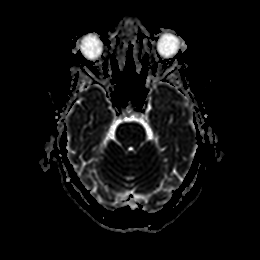
[im 24/36]
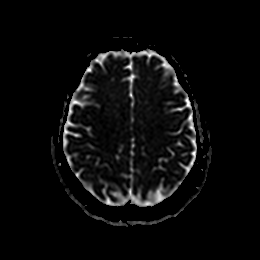
[im 36/36]
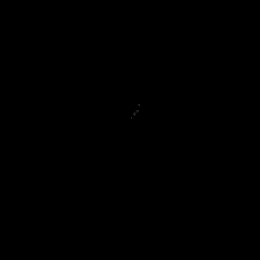

[Series 7: DWI · coronal · 5.0mm · 0.88mm/px · 4 of 28 slices shown (4 of 6)]
[im 1/28]
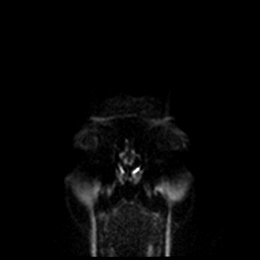
[im 10/28]
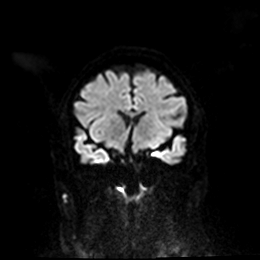
[im 19/28]
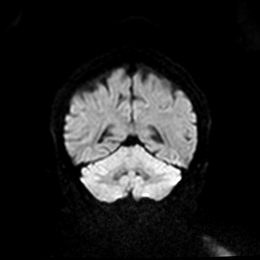
[im 28/28]
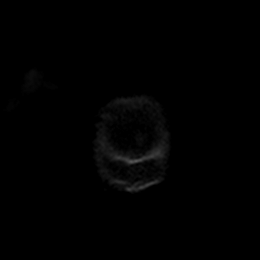

[Series 7: DWI · coronal · 5.0mm · 0.88mm/px · 4 of 28 slices shown (5 of 6)]
[im 1/28]
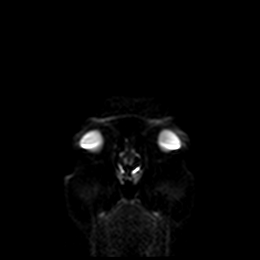
[im 10/28]
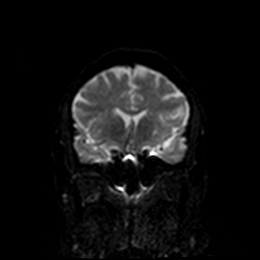
[im 19/28]
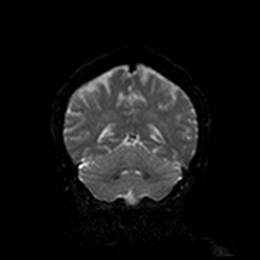
[im 28/28]
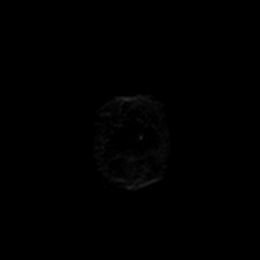

[Series 8: DWI · coronal · 5.0mm · 0.88mm/px · 4 of 28 slices shown (6 of 6)]
[im 1/28]
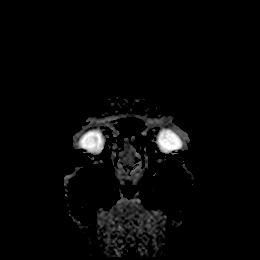
[im 10/28]
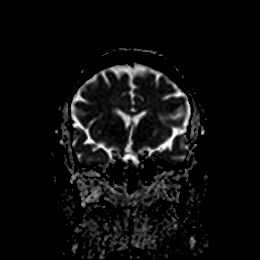
[im 19/28]
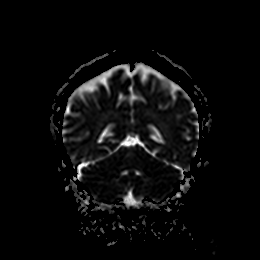
[im 28/28]
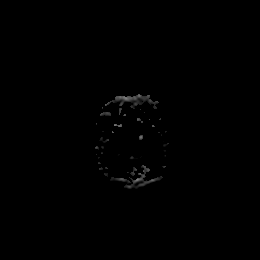

[Series 9: T1 · sagittal · 5.0mm · 0.94mm/px · 3 of 25 slices shown]
[im 1/25]
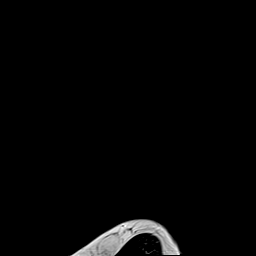
[im 13/25]
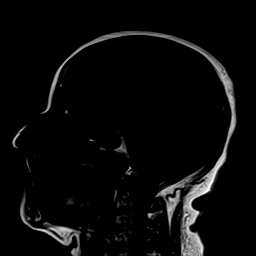
[im 25/25]
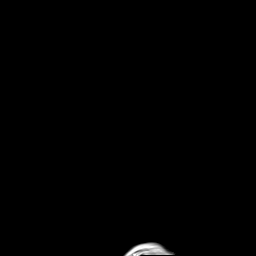

[Series 10: T2 · axial · 5.0mm · 0.72mm/px · z∈[-32,+101]mm · 3 of 20 slices shown (1 of 2)]
[im 1/20]
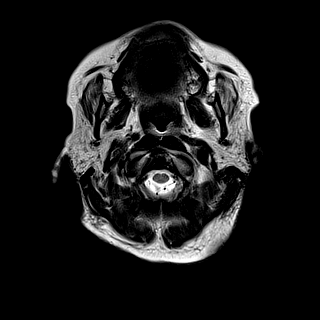
[im 10/20]
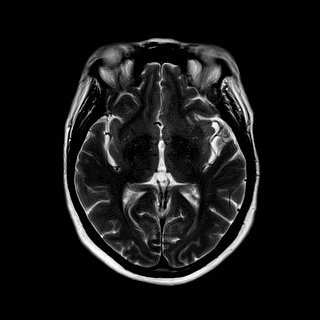
[im 20/20]
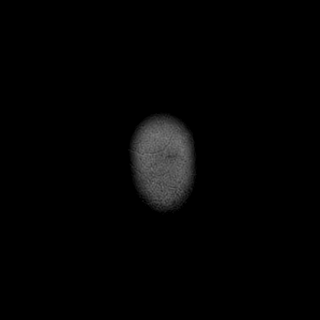

[Series 11: ax hemo · axial · 5.0mm · 0.86mm/px · z∈[-38,+105]mm · 3 of 25 slices shown]
[im 1/25]
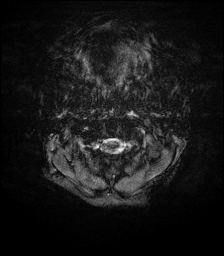
[im 13/25]
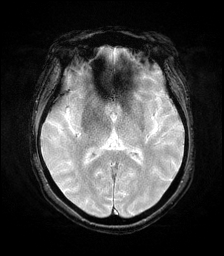
[im 25/25]
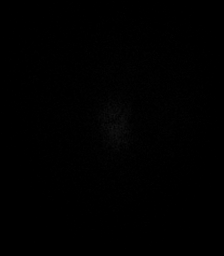

[Series 12: FLAIR · axial · 4.0mm · 0.43mm/px · z∈[-28,+95]mm · 4 of 32 slices shown]
[im 1/32]
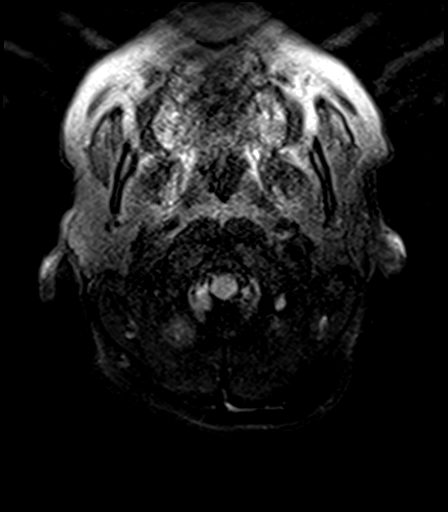
[im 11/32]
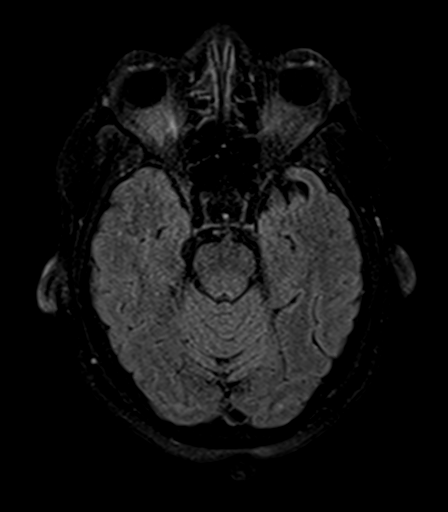
[im 21/32]
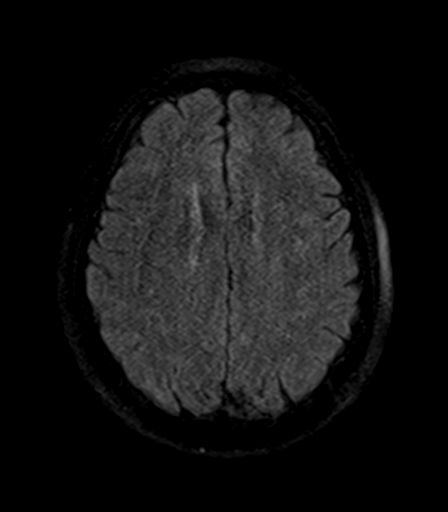
[im 32/32]
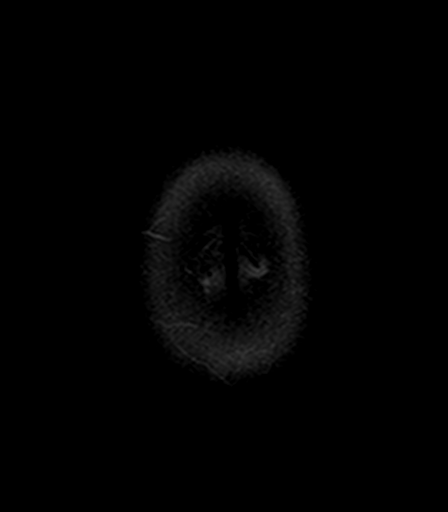

[Series 14: T2 · coronal · 5.0mm · 0.72mm/px · 4 of 28 slices shown (2 of 2)]
[im 1/28]
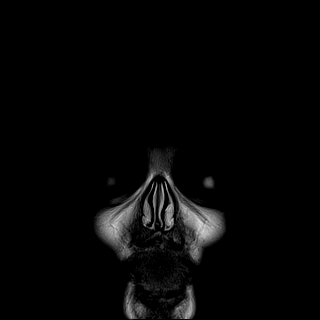
[im 10/28]
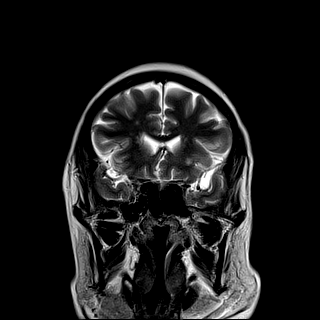
[im 19/28]
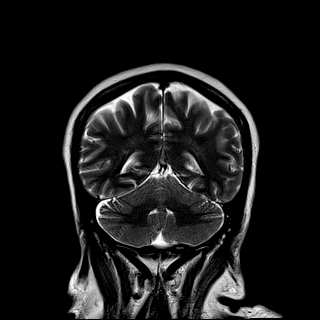
[im 28/28]
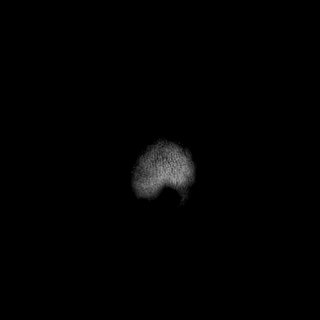

[41 of 48 positions shown; findings below may reference images not displayed]

FINDINGS: Brain:

Mild intermittent motion degradation.

Volume is normal for age.

Mild multifocal T2 FLAIR hyperintense signal abnormality within the
cerebral white matter, nonspecific but compatible chronic small
vessel ischemic disease.

Small chronic lacunar infarcts within the left cerebellar
hemisphere, new as compared to the prior brain MRI of 08/01/2008
(series 10, image 5).

There is no acute infarct.

No evidence of an intracranial mass.

No chronic intracranial blood products.

No extra-axial fluid collection.

No midline shift.

Partially empty sella turcica.

Vascular: Maintained flow voids within the proximal large arterial
vessels.

Skull and upper cervical spine: No focal suspicious marrow lesion.
Incompletely assessed cervical spondylosis.

Sinuses/Orbits: Visualized orbits show no acute finding. Trace
mucosal thickening within a posterior left ethmoid air cell.

Other: Minimal fluid within the bilateral mastoid air cells.
IMPRESSION: Mildly motion degraded exam.

No evidence of acute intracranial abnormality.

Mild chronic small vessel ischemic changes within the cerebral white
matter, slightly progressed from the brain MRI of 08/01/2008.

Small chronic lacunar infarcts within the left cerebellar
hemisphere, new from the prior MRI.

Minimal left ethmoid sinus mucosal thickening.

Trace fluid within the bilateral mastoid air cells.
